# Patient Record
Sex: Female | Born: 1985 | Race: White | Hispanic: No | Marital: Single | Smoking: Current every day smoker
Health system: Southern US, Community
[De-identification: ages and names within clinical notes are randomized; demographics above are authoritative.]

## PROBLEM LIST (undated history)

## (undated) ENCOUNTER — Inpatient Hospital Stay (HOSPITAL_COMMUNITY): Payer: Self-pay

## (undated) DIAGNOSIS — IMO0002 Reserved for concepts with insufficient information to code with codable children: Secondary | ICD-10-CM

## (undated) DIAGNOSIS — Z9889 Other specified postprocedural states: Secondary | ICD-10-CM

## (undated) DIAGNOSIS — S46002A Unspecified injury of muscle(s) and tendon(s) of the rotator cuff of left shoulder, initial encounter: Secondary | ICD-10-CM

## (undated) DIAGNOSIS — F32A Depression, unspecified: Secondary | ICD-10-CM

## (undated) DIAGNOSIS — F329 Major depressive disorder, single episode, unspecified: Secondary | ICD-10-CM

## (undated) DIAGNOSIS — O24419 Gestational diabetes mellitus in pregnancy, unspecified control: Secondary | ICD-10-CM

## (undated) DIAGNOSIS — H9190 Unspecified hearing loss, unspecified ear: Secondary | ICD-10-CM

## (undated) DIAGNOSIS — R112 Nausea with vomiting, unspecified: Secondary | ICD-10-CM

## (undated) DIAGNOSIS — Z8489 Family history of other specified conditions: Secondary | ICD-10-CM

## (undated) DIAGNOSIS — R87629 Unspecified abnormal cytological findings in specimens from vagina: Secondary | ICD-10-CM

## (undated) DIAGNOSIS — B999 Unspecified infectious disease: Secondary | ICD-10-CM

## (undated) DIAGNOSIS — E669 Obesity, unspecified: Secondary | ICD-10-CM

## (undated) DIAGNOSIS — R87619 Unspecified abnormal cytological findings in specimens from cervix uteri: Secondary | ICD-10-CM

## (undated) DIAGNOSIS — K8020 Calculus of gallbladder without cholecystitis without obstruction: Secondary | ICD-10-CM

## (undated) DIAGNOSIS — M549 Dorsalgia, unspecified: Secondary | ICD-10-CM

## (undated) HISTORY — PX: TONSILLECTOMY: SUR1361

## (undated) HISTORY — DX: Major depressive disorder, single episode, unspecified: F32.9

## (undated) HISTORY — PX: OTHER SURGICAL HISTORY: SHX169

## (undated) HISTORY — DX: Obesity, unspecified: E66.9

## (undated) HISTORY — DX: Gestational diabetes mellitus in pregnancy, unspecified control: O24.419

## (undated) HISTORY — DX: Depression, unspecified: F32.A

## (undated) HISTORY — DX: Dorsalgia, unspecified: M54.9

---
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
## 2007-06-21 ENCOUNTER — Emergency Department (HOSPITAL_COMMUNITY): Admission: EM | Admit: 2007-06-21 | Discharge: 2007-06-21 | Payer: Self-pay | Admitting: Emergency Medicine

## 2008-07-10 ENCOUNTER — Emergency Department (HOSPITAL_COMMUNITY): Admission: EM | Admit: 2008-07-10 | Discharge: 2008-07-10 | Payer: Self-pay | Admitting: Family Medicine

## 2008-10-12 ENCOUNTER — Encounter (INDEPENDENT_AMBULATORY_CARE_PROVIDER_SITE_OTHER): Payer: Self-pay | Admitting: *Deleted

## 2008-10-20 ENCOUNTER — Encounter: Payer: Self-pay | Admitting: Family Medicine

## 2008-10-20 ENCOUNTER — Ambulatory Visit: Payer: Self-pay | Admitting: Family Medicine

## 2008-10-20 DIAGNOSIS — E669 Obesity, unspecified: Secondary | ICD-10-CM | POA: Insufficient documentation

## 2008-10-21 LAB — CONVERTED CEMR LAB
HCT: 42 % (ref 36.0–46.0)
Hemoglobin: 14.2 g/dL (ref 12.0–15.0)
MCHC: 33.8 g/dL (ref 30.0–36.0)
MCV: 88.2 fL (ref 78.0–100.0)
Platelets: 282 10*3/uL (ref 150–400)
RBC: 4.76 M/uL (ref 3.87–5.11)
RDW: 13.6 % (ref 11.5–15.5)
TSH: 1.432 microintl units/mL (ref 0.350–4.500)
WBC: 11.4 10*3/uL — ABNORMAL HIGH (ref 4.0–10.5)

## 2008-11-22 ENCOUNTER — Ambulatory Visit: Payer: Self-pay | Admitting: Family Medicine

## 2008-11-22 DIAGNOSIS — J3081 Allergic rhinitis due to animal (cat) (dog) hair and dander: Secondary | ICD-10-CM

## 2008-11-22 DIAGNOSIS — F17200 Nicotine dependence, unspecified, uncomplicated: Secondary | ICD-10-CM

## 2009-01-05 ENCOUNTER — Telehealth: Payer: Self-pay | Admitting: Family Medicine

## 2009-01-06 ENCOUNTER — Ambulatory Visit: Payer: Self-pay | Admitting: Family Medicine

## 2009-01-06 DIAGNOSIS — G4700 Insomnia, unspecified: Secondary | ICD-10-CM

## 2009-01-27 ENCOUNTER — Telehealth: Payer: Self-pay | Admitting: Family Medicine

## 2009-01-27 ENCOUNTER — Ambulatory Visit: Payer: Self-pay | Admitting: Family Medicine

## 2009-02-10 ENCOUNTER — Ambulatory Visit: Payer: Self-pay | Admitting: Family Medicine

## 2009-08-16 ENCOUNTER — Ambulatory Visit: Payer: Self-pay | Admitting: Family Medicine

## 2009-08-16 DIAGNOSIS — H9319 Tinnitus, unspecified ear: Secondary | ICD-10-CM | POA: Insufficient documentation

## 2009-09-04 ENCOUNTER — Telehealth (INDEPENDENT_AMBULATORY_CARE_PROVIDER_SITE_OTHER): Payer: Self-pay | Admitting: *Deleted

## 2009-09-06 ENCOUNTER — Telehealth (INDEPENDENT_AMBULATORY_CARE_PROVIDER_SITE_OTHER): Payer: Self-pay | Admitting: *Deleted

## 2009-09-12 ENCOUNTER — Encounter: Payer: Self-pay | Admitting: Family Medicine

## 2009-10-04 ENCOUNTER — Emergency Department (HOSPITAL_COMMUNITY): Admission: EM | Admit: 2009-10-04 | Discharge: 2009-10-04 | Payer: Self-pay | Admitting: Emergency Medicine

## 2009-10-26 ENCOUNTER — Encounter: Payer: Self-pay | Admitting: Orthopedic Surgery

## 2009-11-08 ENCOUNTER — Ambulatory Visit: Payer: Self-pay | Admitting: Obstetrics & Gynecology

## 2009-11-08 LAB — CONVERTED CEMR LAB: hCG, Beta Chain, Quant, S: <2 milliintl units/mL

## 2009-11-15 ENCOUNTER — Encounter: Payer: Self-pay | Admitting: Orthopedic Surgery

## 2009-11-15 ENCOUNTER — Telehealth: Payer: Self-pay | Admitting: Family Medicine

## 2010-03-15 ENCOUNTER — Emergency Department (HOSPITAL_COMMUNITY): Admission: EM | Admit: 2010-03-15 | Discharge: 2010-03-15 | Payer: Self-pay | Admitting: Family Medicine

## 2010-03-15 ENCOUNTER — Telehealth: Payer: Self-pay | Admitting: *Deleted

## 2010-03-20 ENCOUNTER — Ambulatory Visit: Payer: Self-pay | Admitting: Family Medicine

## 2010-03-20 DIAGNOSIS — J029 Acute pharyngitis, unspecified: Secondary | ICD-10-CM | POA: Insufficient documentation

## 2010-03-20 DIAGNOSIS — F331 Major depressive disorder, recurrent, moderate: Secondary | ICD-10-CM | POA: Insufficient documentation

## 2010-03-20 LAB — CONVERTED CEMR LAB: Rapid Strep: NEGATIVE

## 2010-05-08 ENCOUNTER — Emergency Department (HOSPITAL_COMMUNITY): Admission: EM | Admit: 2010-05-08 | Discharge: 2010-05-08 | Payer: Self-pay | Admitting: Family Medicine

## 2010-06-18 ENCOUNTER — Emergency Department (HOSPITAL_COMMUNITY)
Admission: EM | Admit: 2010-06-18 | Discharge: 2010-06-18 | Payer: Self-pay | Source: Home / Self Care | Admitting: Family Medicine

## 2010-06-19 ENCOUNTER — Inpatient Hospital Stay (HOSPITAL_COMMUNITY)
Admission: AD | Admit: 2010-06-19 | Discharge: 2010-06-19 | Payer: Self-pay | Source: Home / Self Care | Attending: Obstetrics & Gynecology | Admitting: Obstetrics & Gynecology
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
## 2010-07-17 NOTE — Progress Notes (Signed)
Summary: resc  Phone Note Call from Patient   Summary of Call: pt had to resch due to her being on her menses Initial call taken by: De Nurse,  November 15, 2009 10:24 AM  Follow-up for Phone Call        OK. Will see at next appointment  Follow-up by: Bobby Rumpf  MD,  November 15, 2009 11:22 AM

## 2010-07-17 NOTE — Progress Notes (Signed)
Summary: phn msg  Phone Note From Other Clinic Call back at [phone]   [facility] Summary of Call: Can not see pt due to having adult mediciad.  Will have to refer somewhere else. Initial call taken by: Clydell Hakim,  September 04, 2009 2:32 PM  Follow-up for Phone Call         will try [facility]. Theresia Lo RN  September 05, 2009 10:18 AM  Follow-up by: Theresia Lo RN,  September 05, 2009 10:18 AM

## 2010-07-17 NOTE — Progress Notes (Signed)
Summary: msg  Phone Note Call from Patient Call back at [phone]   Caller: Patient Summary of Call: pt returned call Initial call taken by: De Nurse,  September 06, 2009 11:05 AM  Follow-up for Phone Call        message left  to return call again. Follow-up by: Theresia Lo RN,  September 06, 2009 12:27 PM  Additional Follow-up for Phone Call Additional follow up Details #1::        patient notified of appointment time for ENT for hearing evaluation. Additional Follow-up by: Theresia Lo RN,  September 06, 2009 5:01 PM

## 2010-07-17 NOTE — Progress Notes (Signed)
Summary: triage  Phone Note Call from Patient Call back at Home Phone [phone]   Caller: Mom-Teresa Summary of Call: jaw pain - swollen jaw Initial call taken by: De Nurse,  March 15, 2010 8:46 AM  Follow-up for Phone Call        called pt. mom had to wake up the pt. pt reports having swelling of right jaw for a few days. hurts to chew. had dental check up a few weeks ago. does not think, that it's because of her teeth. offered pt to be seen now, pt declined.  advised the pt to call back for appt with PCP, if swelling continues.  Follow-up by: Arlyss Repress CMA,,  March 15, 2010 9:26 AM

## 2010-07-17 NOTE — Consult Note (Signed)
Summary: [facility]  [facility]   Imported By: Bradly Bienenstock 09/15/2009 16:24:52  _____________________________________________________________________  External Attachment:    Type:   Image     Comment:   External Document
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
## 2010-08-24 ENCOUNTER — Encounter: Payer: Self-pay | Admitting: Family Medicine

## 2010-08-24 ENCOUNTER — Ambulatory Visit (INDEPENDENT_AMBULATORY_CARE_PROVIDER_SITE_OTHER): Payer: Self-pay | Admitting: Family Medicine

## 2010-08-24 VITALS — BP 112/62 | HR 92 | Temp 98.2°F | Ht 60.5 in | Wt 162.0 lb

## 2010-08-24 DIAGNOSIS — F339 Major depressive disorder, recurrent, unspecified: Secondary | ICD-10-CM

## 2010-08-24 DIAGNOSIS — R599 Enlarged lymph nodes, unspecified: Secondary | ICD-10-CM

## 2010-08-24 DIAGNOSIS — R590 Localized enlarged lymph nodes: Secondary | ICD-10-CM

## 2010-08-24 MED ORDER — CETIRIZINE HCL 10 MG PO TABS: 10.0000 mg | ORAL_TABLET | Freq: Every day | ORAL | Status: DC

## 2010-08-24 MED ORDER — THERA VITAL M PO TABS: 1.0000 | ORAL_TABLET | Freq: Every day | ORAL | Status: DC

## 2010-08-24 NOTE — Progress Notes (Signed)
  Subjective:    Patient ID: Maureen Perez, female    DOB: [birth year], 23 y.o.   MRN: [id number]  HPI  Left jaw pain x 1 week, + ear pain on right side for same duration, no fever, no tooth pain, pain is more soreness, no injury to face or neck, last tooth removed 1 year ago, URI symptoms 2 weeks ago S/P Permethrin for bedbug treatment that she and mother contacted as they live in a motel currently  Mood- history of depression has been on multiple med,s feels paxil is not helping as much as it used to , still has some sad days, some anger outburst, family situation is difficult, currently they live in a motel, she is still in a relationship, but not employed. Her mother is also dealing with her depression and anxiety and she projects that onto her.    Review of Systems per above     Objective:   Physical Exam   GEN- NAD, alert and oriented    HEENT- TM clear bilat, oropharynx clear, no dental abscess, fair dentition, no sinus pressure, conjunctiva clear, no pain at TMJ, no pain on palpation of mandible   Neck- small mobile anterior cervical nodes < 0.5cm mobile, Tender to palpation, normal ROM, c spine non tender   RESP- CTAB   [facility]-RRR       Assessment & Plan:    Lymphadenopathy- cerivcal nodes palpated where pt is tender, no evidence of current infection but she does recall  Having cold symptoms a few weeks ago, and was treated for scabies, will monitor at this time, if no improvement or they enlarge would image  Depression- pt has been on multiple medications and often switches for various reasons, Effexor may be a good choice for her but she is uninsured. Will have her see psychiatry and she is open to this before any other changes, continue paxil, no red flags, very complex family/social situation contributing to her mood changes and depression

## 2010-08-24 NOTE — Patient Instructions (Signed)
Start the vitamin daily  Start Cetrizine for your allergies We will call you with information for the psychiatrist  If the lymph nodes are still present are painful or larger  in 1 week come back to be seen

## 2010-08-27 LAB — URINE MICROSCOPIC-ADD ON

## 2010-08-27 LAB — COMPREHENSIVE METABOLIC PANEL
ALT: 14 U/L (ref 0–35)
AST: 15 U/L (ref 0–37)
Albumin: 4.3 g/dL (ref 3.5–5.2)
Alkaline Phosphatase: 66 U/L (ref 39–117)
BUN: 6 mg/dL (ref 6–23)
CO2: 28 mEq/L (ref 19–32)
Calcium: 9.4 mg/dL (ref 8.4–10.5)
Chloride: 104 mEq/L (ref 96–112)
Creatinine, Ser: 0.76 mg/dL (ref 0.4–1.2)
GFR calc Af Amer: >60 mL/min (ref >60)
GFR calc non Af Amer: >60 mL/min (ref >60)
Glucose, Bld: 109 mg/dL — ABNORMAL HIGH (ref 70–99)
Potassium: 4 mEq/L (ref 3.5–5.1)
Sodium: 138 mEq/L (ref 135–145)
Total Bilirubin: 0.6 mg/dL (ref 0.3–1.2)
Total Protein: 7.5 g/dL (ref 6.0–8.3)

## 2010-08-27 LAB — POCT URINALYSIS DIPSTICK
Bilirubin Urine: NEGATIVE
Glucose, UA: NEGATIVE mg/dL
Ketones, ur: NEGATIVE mg/dL
Nitrite: NEGATIVE
Protein, ur: NEGATIVE mg/dL
Specific Gravity, Urine: <=1.005 (ref 1.005–1.030)
Urobilinogen, UA: 0.2 mg/dL (ref 0.0–1.0)
pH: 6.5 (ref 5.0–8.0)

## 2010-08-27 LAB — URINALYSIS, ROUTINE W REFLEX MICROSCOPIC
Bilirubin Urine: NEGATIVE
Glucose, UA: NEGATIVE mg/dL
Ketones, ur: NEGATIVE mg/dL
Nitrite: NEGATIVE
Protein, ur: 30 mg/dL — AB
Specific Gravity, Urine: 1.01 (ref 1.005–1.030)
Urobilinogen, UA: 0.2 mg/dL (ref 0.0–1.0)
pH: 7 (ref 5.0–8.0)

## 2010-08-27 LAB — URINE CULTURE
Colony Count: 35000
Culture  Setup Time: 201201021638

## 2010-08-27 LAB — HCG, QUANTITATIVE, PREGNANCY: hCG, Beta Chain, Quant, S: <2 m[IU]/mL (ref <5)

## 2010-08-27 LAB — POCT PREGNANCY, URINE
Preg Test, Ur: NEGATIVE
Preg Test, Ur: NEGATIVE

## 2010-08-27 LAB — ABO/RH: ABO/RH(D): O POS

## 2010-08-27 LAB — CBC
HCT: 42.4 % (ref 36.0–46.0)
Hemoglobin: 14.1 g/dL (ref 12.0–15.0)
MCH: 29.3 pg (ref 26.0–34.0)
MCHC: 33.3 g/dL (ref 30.0–36.0)
MCV: 88 fL (ref 78.0–100.0)
Platelets: 265 10*3/uL (ref 150–400)
RBC: 4.82 MIL/uL (ref 3.87–5.11)
RDW: 13.2 % (ref 11.5–15.5)
WBC: 9.3 10*3/uL (ref 4.0–10.5)

## 2010-08-27 LAB — GC/CHLAMYDIA PROBE AMP, GENITAL
Chlamydia, DNA Probe: NEGATIVE
GC Probe Amp, Genital: NEGATIVE

## 2010-08-27 LAB — WET PREP, GENITAL
Trich, Wet Prep: NONE SEEN
Yeast Wet Prep HPF POC: NONE SEEN

## 2010-08-28 LAB — POCT URINALYSIS DIPSTICK
Glucose, UA: NEGATIVE mg/dL
Nitrite: NEGATIVE
Urobilinogen, UA: 0.2 mg/dL (ref 0.0–1.0)

## 2010-08-31 ENCOUNTER — Telehealth: Payer: Self-pay | Admitting: Family Medicine

## 2010-08-31 NOTE — Telephone Encounter (Signed)
Unable to reach pt. I called in her rx to the health dept.Maureen Perez

## 2010-08-31 NOTE — Telephone Encounter (Signed)
States that last 2 scripts were sent to wrong pharmacy.  Needs to go to [facility]

## 2010-09-05 ENCOUNTER — Telehealth: Payer: Self-pay | Admitting: Family Medicine

## 2010-09-05 NOTE — Telephone Encounter (Signed)
Will route to her PCP. 

## 2010-09-05 NOTE — Telephone Encounter (Signed)
Pt states that [facility] does not have the Zyrtec or vitamins that were supposed to be called in for her.

## 2010-09-06 MED ORDER — THERA VITAL M PO TABS: 1.0000 | ORAL_TABLET | Freq: Every day | ORAL | Status: DC

## 2010-09-06 MED ORDER — CETIRIZINE HCL 10 MG PO TABS: 10.0000 mg | ORAL_TABLET | Freq: Every day | ORAL | Status: DC

## 2010-09-06 NOTE — Telephone Encounter (Signed)
[facility] evidently did not get initial script, [facility]- told pt she can pick them up

## 2010-09-06 NOTE — Telephone Encounter (Signed)
Pt calling again, wants these [facility]

## 2010-09-11 ENCOUNTER — Encounter: Payer: Self-pay | Admitting: Family Medicine

## 2010-09-11 ENCOUNTER — Ambulatory Visit (INDEPENDENT_AMBULATORY_CARE_PROVIDER_SITE_OTHER): Payer: Self-pay | Admitting: Family Medicine

## 2010-09-11 VITALS — BP 118/78 | Temp 97.7°F | Ht 61.0 in | Wt 166.0 lb

## 2010-09-11 DIAGNOSIS — I889 Nonspecific lymphadenitis, unspecified: Secondary | ICD-10-CM

## 2010-09-11 DIAGNOSIS — R109 Unspecified abdominal pain: Secondary | ICD-10-CM | POA: Insufficient documentation

## 2010-09-11 DIAGNOSIS — F331 Major depressive disorder, recurrent, moderate: Secondary | ICD-10-CM

## 2010-09-11 LAB — COMPREHENSIVE METABOLIC PANEL
AST: 20 U/L (ref 0–37)
Alkaline Phosphatase: 56 U/L (ref 39–117)
BUN: 7 mg/dL (ref 6–23)
Creat: 0.73 mg/dL (ref 0.40–1.20)
Total Bilirubin: 0.4 mg/dL (ref 0.3–1.2)

## 2010-09-11 LAB — CBC WITH DIFFERENTIAL/PLATELET
Basophils Absolute: 0 10*3/uL (ref 0.0–0.1)
Basophils Relative: 0 % (ref 0–1)
Eosinophils Relative: 5 % (ref 0–5)
HCT: 43.1 % (ref 36.0–46.0)
MCHC: 32.9 g/dL (ref 30.0–36.0)
MCV: 90.9 fL (ref 78.0–100.0)
Monocytes Absolute: 0.8 10*3/uL (ref 0.1–1.0)
Neutro Abs: 5.4 10*3/uL (ref 1.7–7.7)
RDW: 13.4 % (ref 11.5–15.5)

## 2010-09-11 LAB — POCT MONO (EPSTEIN BARR VIRUS): Mono, POC: NEGATIVE

## 2010-09-11 MED ORDER — CLINDAMYCIN HCL 300 MG PO CAPS: 300.0000 mg | ORAL_CAPSULE | Freq: Three times a day (TID) | ORAL | Status: DC

## 2010-09-11 NOTE — Progress Notes (Signed)
  Subjective:    Patient ID: Maureen Perez, female    DOB: 11-28-1985, 25 y.o.   MRN: [id number]  HPI       Now in a new room at the motel- no longer symptomatic from bed bugs          Lymphadenopathy-  Persist lymph nodes x 3 weeks,now feels some discomoft on on the right side, no fever, no vomiting, mild sore throat, no URI symptoms    Abd pain- Abd pain x 2 weeks.   Pain across epigastrium - sharp pain, no heartburn, has had pain since she was a child- notes spicey foods, tomatoes cause this pain, raw vegtabables and fatty food/greasy causes pain     sister has a ?contagious stomach infection currently and [name] drank after her.   No diarrhea, regular bowel movements, decreased appetite ,no blood in stools , no emesis  no dysuria, no vaginal discharge   Depression- Has split from her fianacee, continues to stay with her mother and father in the motel, Paxil not helping as much, feels she is still irriated, has both good and bad days, wants to attend couseling, everyone agrees it would be best for the family, no SI, no thoughts of hurting others      LMP- March 5th  Review of Systems  Per above     Objective:   Physical Exam  GEN- NAD, alert and oriented  TM clear bilat, oropharynx clear, no dental abscess, fair dentition,  conjunctiva clear, no pain at TMJ, no pain on palpation of mandible  Neck- small mobile anterior cervical nodes - Left  0.5cm mobile- largest, shotty on right side, Tender to palpation, normal ROM, c spine non tender  ABD- NABS, soft, mild diffuse tenderness, no rebound, no gaurding, no splenomegaly, no hepatomegaly, neg murphy sign PSYCH- not depressed appearing, not anxious appearing, normal affect, good eye contact, speech normal       Assessment & Plan:

## 2010-09-11 NOTE — Patient Instructions (Signed)
I will call you with lab results Start the clindamycin for your lymph nodes- 1 tablet three times a day for 10 days For your stomach, avoid fatty foods, and greasy foods, spicey foods If you develop fever, or vomiting or change in your stools come in to be seen or go to the ER if needed.  For your mood Call these numbers to set up appt with a pyschiatrist  [facility] [phone] (Outpatient treatment/ psychiatry/psychology) [facility] [phone] [facility] Support Team [phone]

## 2010-09-12 ENCOUNTER — Telehealth: Payer: Self-pay | Admitting: Family Medicine

## 2010-09-12 ENCOUNTER — Encounter: Payer: Self-pay | Admitting: Family Medicine

## 2010-09-12 ENCOUNTER — Telehealth: Payer: Self-pay | Admitting: *Deleted

## 2010-09-12 MED ORDER — CLINDAMYCIN HCL 300 MG PO CAPS: 300.0000 mg | ORAL_CAPSULE | Freq: Three times a day (TID) | ORAL | Status: AC

## 2010-09-12 MED ORDER — IBUPROFEN 800 MG PO TABS: 800.0000 mg | ORAL_TABLET | Freq: Three times a day (TID) | ORAL | Status: DC | PRN

## 2010-09-12 NOTE — Assessment & Plan Note (Signed)
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

## 2010-09-12 NOTE — Assessment & Plan Note (Signed)
Pt has been on  Multiple meds, I have given her numbers for psychiatrist and family therapy.  Note she was with her mother for a later appt today and had scheduled with [facility] for next week. I would appreciate help with management of her meds and the family will definitely benefit from counseling

## 2010-09-12 NOTE — Telephone Encounter (Signed)
Disregard request for change of pharmacy.  Pt needed to have rx for ibuprofen sent to [facility].

## 2010-09-12 NOTE — Telephone Encounter (Signed)
Unable to reach pt by phone to give lab results Will send prescription to [facility]

## 2010-09-12 NOTE — Assessment & Plan Note (Addendum)
Acute abd pain, though pt has had history of possibly some IBS as a child, does not fit typical gastroenteritis without fever, emesis change in bowels, exam not concerning for gallbladder or spleen enlargement with lymph nodes. This could also be her somatization of her stress and mood disorder as things are difficult right now for the family and recent breakup Check CBC, CMET, Monospot

## 2010-09-12 NOTE — Telephone Encounter (Signed)
Message copied by Tessie Fass on Wed Sep 12, 2010  5:23 PM ------      Message from: Milinda Antis      Created: Wed Sep 12, 2010  4:23 PM       Please let Ms. Blazejewski know her labs were normal      No evidence of infection, no evidence of liver, kidney, or gallbladder disease, her mono test was normal      Drink plenty of fluids, her abd pain should improve      If she has diarrhea, vomiting, fever or worsening of pain she should return to see me            Ibuprofen was sent to [facility]

## 2010-09-12 NOTE — Telephone Encounter (Signed)
Called patient and informed of message from Dr Novinger.Maureen Perez  

## 2010-09-12 NOTE — Telephone Encounter (Signed)
Maureen Perez called to say that the rx written for [place] yesterday was sent to [facility] in error.  [facility]  Please resend to them for pickup

## 2010-09-17 ENCOUNTER — Other Ambulatory Visit: Payer: Self-pay | Admitting: Family Medicine

## 2010-09-17 MED ORDER — DESLORATADINE 5 MG PO TABS: 5.0000 mg | ORAL_TABLET | Freq: Every day | ORAL | Status: DC

## 2010-09-17 NOTE — Telephone Encounter (Signed)
Changed Zyrtec to Clarinex as covered by MAP program

## 2010-09-18 ENCOUNTER — Ambulatory Visit (HOSPITAL_COMMUNITY): Payer: Self-pay | Admitting: Psychology

## 2010-10-02 ENCOUNTER — Ambulatory Visit (HOSPITAL_COMMUNITY): Payer: Self-pay | Admitting: Psychology

## 2010-10-10 ENCOUNTER — Ambulatory Visit (HOSPITAL_COMMUNITY): Payer: Self-pay | Admitting: Physician Assistant

## 2010-10-10 DIAGNOSIS — F340 Cyclothymic disorder: Secondary | ICD-10-CM

## 2010-11-01 ENCOUNTER — Encounter (HOSPITAL_COMMUNITY): Payer: Self-pay | Admitting: Physician Assistant

## 2010-11-02 NOTE — Op Note (Signed)
[facility]  Patient:    Maureen Perez, Maureen Perez                    MRN: [id number] Proc. Date: 06/11/00 Adm. Date:  [id number] Disc. Date: [id number] Attending:  Sandi Raveling CC:         Sonda Primes, [name] [facility]   Operative Report  PREOPERATIVE DIAGNOSES: 1. Chronic tonsillitis. 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  POSTOPERATIVE DIAGNOSES: 1. Chronic tonsillitis. 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  PROCEDURE:  Tonsillectomy and adenoidectomy.  SURGEON:  Jefry H. Pollyann Kennedy, [name]  ANESTHESIA:  General endotracheal anesthesia was used.  COMPLICATIONS:  No complications.  ESTIMATED BLOOD LOSS:  Less than 10 cc.  FINDINGS:  Diffuse enlargement of the tonsils with cryptic spaces and tonsil lithiasis bilaterally.  Mild to moderate enlargement of the adenoid with partial obstruction of the nasopharynx.  REFERRING PHYSICIAN:  Sonda Primes, [name]  HISTORY:  A 25 year old with a history of loud snoring, obstructive breathing, and chronic tonsillitis.  Risks, benefits, alternatives, and complications of the procedure were explained to the parents, who seemed to understand and agreed.  DESCRIPTION OF PROCEDURE:  The patient was taken to the operating room and placed on the operating table in supine position.  Following induction of general endotracheal anesthesia, the table was turned 90 degrees and the patient was draped in the standard fashion.  A Crowe-Davis mouth gag was inserted into the oral cavity, used to retract the tongue and mandible, and attached to the Mayo stand.  Inspection of the palate revealed no evidence of a submucous cleft or shortening of the soft palate.  Red rubber catheter was inserted in the right side of the nose, withdrawn through the mouth, and used to retract the soft palate and uvula.  Indirect examination of the nasopharynx was performed, and a single pass of a large adenoid  curette was used to remove the majority of the adenoid tissue.  The nasopharynx was then packed while the tonsillectomy was performed.  Tonsillectomy was performed using electrocautery dissection, carefully dissecting an avascular plane between the tonsillar capsule and the constrictor muscle.  There was minimal bleeding encountered along the dissection.  The tonsils were sent together for pathologic evaluation with the adenoid tissue.  The packing was removed from the nasopharynx, and suction cautery was used to provide hemostasis.  The pharynx was suctioned of blood and secretions, irrigated with saline solution, and a nasogastric tube was used to aspirate the contents of the stomach.  The patient was then awakened, extubated, and transferred to recovery. DD:  06/11/00 TD:  06/11/00 Job: [id number]

## 2010-12-09 ENCOUNTER — Emergency Department (HOSPITAL_COMMUNITY): Payer: Self-pay

## 2010-12-09 ENCOUNTER — Inpatient Hospital Stay (INDEPENDENT_AMBULATORY_CARE_PROVIDER_SITE_OTHER)
Admission: RE | Admit: 2010-12-09 | Discharge: 2010-12-09 | Disposition: A | Discharge status: Home / Self Care | Payer: Self-pay | Source: Ambulatory Visit | Attending: Family Medicine | Admitting: Family Medicine

## 2010-12-09 ENCOUNTER — Emergency Department (HOSPITAL_COMMUNITY)
Admission: EM | Admit: 2010-12-09 | Discharge: 2010-12-09 | Disposition: A | Discharge status: Home / Self Care | Payer: Self-pay | Attending: Emergency Medicine | Admitting: Emergency Medicine

## 2010-12-09 DIAGNOSIS — K5730 Diverticulosis of large intestine without perforation or abscess without bleeding: Secondary | ICD-10-CM | POA: Insufficient documentation

## 2010-12-09 DIAGNOSIS — N898 Other specified noninflammatory disorders of vagina: Secondary | ICD-10-CM | POA: Insufficient documentation

## 2010-12-09 DIAGNOSIS — R1110 Vomiting, unspecified: Secondary | ICD-10-CM | POA: Insufficient documentation

## 2010-12-09 DIAGNOSIS — R1031 Right lower quadrant pain: Secondary | ICD-10-CM

## 2010-12-09 DIAGNOSIS — R300 Dysuria: Secondary | ICD-10-CM | POA: Insufficient documentation

## 2010-12-09 DIAGNOSIS — K7689 Other specified diseases of liver: Secondary | ICD-10-CM | POA: Insufficient documentation

## 2010-12-09 LAB — POCT I-STAT, CHEM 8
Chloride: 106 mEq/L (ref 96–112)
Creatinine, Ser: 0.8 mg/dL (ref 0.50–1.10)
Glucose, Bld: 90 mg/dL (ref 70–99)
HCT: 44 % (ref 36.0–46.0)
Potassium: 4.2 mEq/L (ref 3.5–5.1)

## 2010-12-09 LAB — URINALYSIS, ROUTINE W REFLEX MICROSCOPIC
Leukocytes, UA: NEGATIVE
Nitrite: NEGATIVE
Specific Gravity, Urine: 1.01 (ref 1.005–1.030)
Urobilinogen, UA: 0.2 mg/dL (ref 0.0–1.0)

## 2010-12-09 LAB — DIFFERENTIAL
Eosinophils Relative: 2 % (ref 0–5)
Lymphocytes Relative: 23 % (ref 12–46)
Lymphs Abs: 2.6 10*3/uL (ref 0.7–4.0)

## 2010-12-09 LAB — POCT URINALYSIS DIP (DEVICE)
Ketones, ur: NEGATIVE mg/dL
Protein, ur: 100 mg/dL — AB
Specific Gravity, Urine: 1.02 (ref 1.005–1.030)
pH: 6 (ref 5.0–8.0)

## 2010-12-09 LAB — CBC
HCT: 42.2 % (ref 36.0–46.0)
Hemoglobin: 14.7 g/dL (ref 12.0–15.0)
MCV: 88.3 fL (ref 78.0–100.0)
RDW: 13.4 % (ref 11.5–15.5)
WBC: 11 10*3/uL — ABNORMAL HIGH (ref 4.0–10.5)

## 2010-12-09 LAB — URINE MICROSCOPIC-ADD ON

## 2010-12-09 LAB — POCT PREGNANCY, URINE: Preg Test, Ur: NEGATIVE

## 2010-12-09 LAB — WET PREP, GENITAL

## 2010-12-09 MED ORDER — IOHEXOL 300 MG/ML  SOLN
80.0000 mL | Freq: Once | INTRAMUSCULAR | Status: AC | PRN
  Administered 2010-12-09: 80 mL via INTRAVENOUS

## 2010-12-11 LAB — URINE CULTURE
Colony Count: NO GROWTH
Culture  Setup Time: 201206242032
Culture: NO GROWTH

## 2010-12-30 ENCOUNTER — Encounter (HOSPITAL_COMMUNITY): Payer: Self-pay | Admitting: *Deleted

## 2010-12-30 ENCOUNTER — Inpatient Hospital Stay (HOSPITAL_COMMUNITY)
Admission: AD | Admit: 2010-12-30 | Discharge: 2010-12-30 | Disposition: A | Discharge status: Home / Self Care | Payer: Self-pay | Source: Ambulatory Visit | Attending: Obstetrics and Gynecology | Admitting: Obstetrics and Gynecology

## 2010-12-30 DIAGNOSIS — O210 Mild hyperemesis gravidarum: Secondary | ICD-10-CM

## 2010-12-30 DIAGNOSIS — Z3201 Encounter for pregnancy test, result positive: Secondary | ICD-10-CM

## 2010-12-30 DIAGNOSIS — O219 Vomiting of pregnancy, unspecified: Secondary | ICD-10-CM

## 2010-12-30 LAB — COMPREHENSIVE METABOLIC PANEL
ALT: 17 U/L (ref 0–35)
AST: 13 U/L (ref 0–37)
Alkaline Phosphatase: 68 U/L (ref 39–117)
CO2: 23 mEq/L (ref 19–32)
Chloride: 103 mEq/L (ref 96–112)
GFR calc Af Amer: >60 mL/min (ref >60)
GFR calc non Af Amer: >60 mL/min (ref >60)
Glucose, Bld: 89 mg/dL (ref 70–99)
Potassium: 3.9 mEq/L (ref 3.5–5.1)
Sodium: 137 mEq/L (ref 135–145)
Total Bilirubin: 0.3 mg/dL (ref 0.3–1.2)

## 2010-12-30 LAB — URINALYSIS, ROUTINE W REFLEX MICROSCOPIC
Ketones, ur: NEGATIVE mg/dL
Leukocytes, UA: NEGATIVE
Protein, ur: NEGATIVE mg/dL
Urobilinogen, UA: 0.2 mg/dL (ref 0.0–1.0)

## 2010-12-30 LAB — POCT PREGNANCY, URINE: Preg Test, Ur: NEGATIVE

## 2010-12-30 LAB — CBC
Hemoglobin: 13.7 g/dL (ref 12.0–15.0)
MCH: 29.9 pg (ref 26.0–34.0)
Platelets: 238 10*3/uL (ref 150–400)
RBC: 4.58 MIL/uL (ref 3.87–5.11)
WBC: 11.5 10*3/uL — ABNORMAL HIGH (ref 4.0–10.5)

## 2010-12-30 MED ORDER — PROMETHAZINE HCL 12.5 MG PO TABS: 25.0000 mg | ORAL_TABLET | Freq: Four times a day (QID) | ORAL | Status: DC | PRN

## 2010-12-30 NOTE — Progress Notes (Signed)
I came in because I was vomiting, couldn't keep anything down, dizzy.  +HPT (faint)

## 2010-12-30 NOTE — Progress Notes (Signed)
Pt presents to [facility] for c/o nausea for the last week or 2.  States she can't keep food down but can keep fluids down. Has been feeling dizzy.  Took HPT and was +.

## 2010-12-30 NOTE — ED Notes (Signed)
N. Bascom Levels, CNM in to see pt.  Assessment done and poc discussed.

## 2010-12-30 NOTE — ED Provider Notes (Signed)
History     Chief Complaint  Patient presents with  . Emesis During Pregnancy   HPI G1P0010 reports vomiting x 2 weeks, vomits every time she eats, able to keep fluids down. "Faint positive" UPT at home. No pain, dizziness, no dysuria, constipation, diarrhea LMP 6/1.   OB History    Grav Para Term Preterm Abortions TAB SAB Ect Mult Living   1    1  1    0      Past Medical History  Diagnosis Date  . Depression   . Back pain   . Obesity     No past surgical history on file.  Family History  Problem Relation Age of Onset  . Depression Mother   . Diabetes Mother   . Hypertension Mother   . COPD Mother   . Depression Sister   . Diabetes Sister   . Kidney disease Sister     History  Substance Use Topics  . Smoking status: Current Everyday Smoker -- 1.0 packs/day    Types: Cigarettes  . Smokeless tobacco: Never Used  . Alcohol Use: No    Allergies:  Allergies  Allergen Reactions  . Keflex Itching and Nausea And Vomiting    Prescriptions prior to admission  Medication Sig Dispense Refill  . desloratadine (CLARINEX) 5 MG tablet Take 1 tablet (5 mg total) by mouth daily.  30 tablet  2  . Multiple Vitamins-Minerals (MULTIVITAMIN) tablet Take 1 tablet by mouth daily.  30 tablet  11  . ibuprofen (ADVIL,MOTRIN) 800 MG tablet Take 1 tablet (800 mg total) by mouth 3 (three) times daily as needed. for pain  40 tablet  2  . PARoxetine (PAXIL) 20 MG tablet Take 20 mg by mouth daily.          Review of Systems  Constitutional: Negative for fever, chills and malaise/fatigue.  HENT: Negative.   Respiratory: Negative.   Cardiovascular: Negative.   Gastrointestinal: Positive for nausea and vomiting. Negative for heartburn, abdominal pain, diarrhea and constipation.  Genitourinary: Negative.   Musculoskeletal: Negative.   Skin: Negative.   Neurological: Negative.   Psychiatric/Behavioral: Negative.    Physical Exam   Blood pressure 126/79, pulse 84, temperature 98.4 F  (36.9 C), temperature source Oral, resp. rate 20, height 5\' 1"  (1.549 m), weight 71.396 kg (157 lb 6.4 oz), last menstrual period 11/16/2010.  Physical Exam  Constitutional: She is oriented to person, place, and time. She appears well-developed and well-nourished.  Cardiovascular: Normal rate.   Respiratory: Effort normal.  GI: Soft. She exhibits no distension and no mass. There is no tenderness. There is no rebound and no guarding.  Neurological: She is alert and oriented to person, place, and time.  Skin: Skin is warm and dry.  Psychiatric: She has a normal mood and affect.   Results for orders placed during the hospital encounter of 12/30/10 (from the past 24 hour(s))  URINALYSIS, ROUTINE W REFLEX MICROSCOPIC     Status: Abnormal   Collection Time   12/30/10  8:10 PM      Component Value Range   Color, Urine YELLOW  YELLOW    Appearance CLEAR  CLEAR    Specific Gravity, Urine <1.005 (*) 1.005 - 1.030    pH 5.5  5.0 - 8.0    Glucose, UA NEGATIVE  NEGATIVE (mg/dL)   Hgb urine dipstick NEGATIVE  NEGATIVE    Bilirubin Urine NEGATIVE  NEGATIVE    Ketones, ur NEGATIVE  NEGATIVE (mg/dL)   Protein, ur NEGATIVE  NEGATIVE (mg/dL)   Urobilinogen, UA 0.2  0.0 - 1.0 (mg/dL)   Nitrite NEGATIVE  NEGATIVE    Leukocytes, UA NEGATIVE  NEGATIVE   POCT PREGNANCY, URINE     Status: Normal   Collection Time   12/30/10  8:40 PM      Component Value Range   Preg Test, Ur NEGATIVE    HCG, SERUM, QUALITATIVE     Status: Abnormal   Collection Time   12/30/10  9:10 PM      Component Value Range   Preg, Serum POSITIVE (*) NEGATIVE   CBC     Status: Abnormal   Collection Time   12/30/10  9:10 PM      Component Value Range   WBC 11.5 (*) 4.0 - 10.5 (K/uL)   RBC 4.58  3.87 - 5.11 (MIL/uL)   Hemoglobin 13.7  12.0 - 15.0 (g/dL)   HCT 81.1  91.4 - 78.2 (%)   MCV 89.3  78.0 - 100.0 (fL)   MCH 29.9  26.0 - 34.0 (pg)   MCHC 33.5  30.0 - 36.0 (g/dL)   RDW 95.6  21.3 - 08.6 (%)   Platelets 238  150 - 400  (K/uL)  COMPREHENSIVE METABOLIC PANEL     Status: Normal   Collection Time   12/30/10  9:10 PM      Component Value Range   Sodium 137  135 - 145 (mEq/L)   Potassium 3.9  3.5 - 5.1 (mEq/L)   Chloride 103  96 - 112 (mEq/L)   CO2 23  19 - 32 (mEq/L)   Glucose, Bld 89  70 - 99 (mg/dL)   BUN 6  6 - 23 (mg/dL)   Creatinine, Ser 5.78  0.50 - 1.10 (mg/dL)   Calcium 9.4  8.4 - 46.9 (mg/dL)   Total Protein 7.6  6.0 - 8.3 (g/dL)   Albumin 4.0  3.5 - 5.2 (g/dL)   AST 13  0 - 37 (U/L)   ALT 17  0 - 35 (U/L)   Alkaline Phosphatase 68  39 - 117 (U/L)   Total Bilirubin 0.3  0.3 - 1.2 (mg/dL)   GFR calc non Af Amer >60  >60 (mL/min)   GFR calc Af Amer >60  >60 (mL/min)     MAU Course  Procedures   A/P: 25 yo G2P0010 @ [redacted] wks EGA with nausea and vomiting of pregnancy Rx phenergan Start prenatal care, pregnancy verification given

## 2011-01-03 ENCOUNTER — Encounter (HOSPITAL_COMMUNITY): Payer: Self-pay | Admitting: *Deleted

## 2011-01-10 ENCOUNTER — Encounter: Payer: Self-pay | Admitting: Family Medicine

## 2011-01-16 ENCOUNTER — Other Ambulatory Visit: Payer: Self-pay

## 2011-01-16 ENCOUNTER — Ambulatory Visit (INDEPENDENT_AMBULATORY_CARE_PROVIDER_SITE_OTHER): Payer: Self-pay | Admitting: *Deleted

## 2011-01-16 ENCOUNTER — Encounter: Payer: Self-pay | Admitting: *Deleted

## 2011-01-16 DIAGNOSIS — O3680X0 Pregnancy with inconclusive fetal viability, not applicable or unspecified: Secondary | ICD-10-CM

## 2011-01-16 DIAGNOSIS — Z3480 Encounter for supervision of other normal pregnancy, unspecified trimester: Secondary | ICD-10-CM

## 2011-01-16 NOTE — Progress Notes (Signed)
Patient had two periods in June.

## 2011-01-17 LAB — OBSTETRIC PANEL
Antibody Screen: NEGATIVE
Eosinophils Relative: 1 % (ref 0–5)
Hemoglobin: 13.9 g/dL (ref 12.0–15.0)
Lymphocytes Relative: 21 % (ref 12–46)
Lymphs Abs: 2.2 10*3/uL (ref 0.7–4.0)
MCH: 30 pg (ref 26.0–34.0)
MCHC: 33.2 g/dL (ref 30.0–36.0)
Monocytes Absolute: 0.6 10*3/uL (ref 0.1–1.0)
Rubella: 9.5 IU/mL — ABNORMAL HIGH

## 2011-01-18 ENCOUNTER — Inpatient Hospital Stay (HOSPITAL_COMMUNITY): Payer: Self-pay

## 2011-01-18 ENCOUNTER — Inpatient Hospital Stay (HOSPITAL_COMMUNITY)
Admission: AD | Admit: 2011-01-18 | Discharge: 2011-01-19 | Disposition: A | Discharge status: Home / Self Care | Payer: Self-pay | Source: Ambulatory Visit | Attending: Obstetrics & Gynecology | Admitting: Obstetrics & Gynecology

## 2011-01-18 DIAGNOSIS — Z3490 Encounter for supervision of normal pregnancy, unspecified, unspecified trimester: Secondary | ICD-10-CM

## 2011-01-18 DIAGNOSIS — R109 Unspecified abdominal pain: Secondary | ICD-10-CM | POA: Insufficient documentation

## 2011-01-18 DIAGNOSIS — O26859 Spotting complicating pregnancy, unspecified trimester: Secondary | ICD-10-CM | POA: Insufficient documentation

## 2011-01-18 DIAGNOSIS — Z1389 Encounter for screening for other disorder: Secondary | ICD-10-CM

## 2011-01-18 LAB — URINALYSIS, ROUTINE W REFLEX MICROSCOPIC
Glucose, UA: NEGATIVE mg/dL
Ketones, ur: NEGATIVE mg/dL
Leukocytes, UA: NEGATIVE
Specific Gravity, Urine: 1.02 (ref 1.005–1.030)
pH: 6 (ref 5.0–8.0)

## 2011-01-18 LAB — HCG, QUANTITATIVE, PREGNANCY: hCG, Beta Chain, Quant, S: 26060 m[IU]/mL — ABNORMAL HIGH (ref <5)

## 2011-01-18 LAB — URINE MICROSCOPIC-ADD ON

## 2011-01-18 NOTE — Progress Notes (Signed)
Pt states, " I started spotting at 3 pm  Today and then a an hour and half latter I started cramping."

## 2011-01-18 NOTE — ED Notes (Signed)
Pt transferred to [place] ambulatory from lobby. Plan of care  Explained to pt.

## 2011-01-19 NOTE — ED Provider Notes (Signed)
History     Chief Complaint  Patient presents with  . Vaginal Bleeding   HPI  Maureen Perez  is a 25 y.o. G2P0010 at [redacted]w[redacted]d weeks presenting with light spotting and mild cramping earlier tonight, no pain or bleeding since. Prenatal care initiated at [facility] this week. Has next appt on 8/15.   OB History    Grav Para Term Preterm Abortions TAB SAB Ect Mult Living   2    1  1    0      Past Medical History  Diagnosis Date  . Depression   . Back pain   . Obesity     Past Surgical History  Procedure Date  . Tubes in ears age 68  . Tonsillectomy     age 37    Family History  Problem Relation Age of Onset  . Depression Mother   . Diabetes Mother   . Hypertension Mother   . COPD Mother   . Depression Sister   . Diabetes Sister   . Kidney disease Sister     History  Substance Use Topics  . Smoking status: Current Everyday Smoker -- 1.0 packs/day    Types: Cigarettes  . Smokeless tobacco: Never Used  . Alcohol Use: No    Allergies:  Allergies  Allergen Reactions  . Keflex Itching and Nausea And Vomiting    Prescriptions prior to admission  Medication Sig Dispense Refill  . desloratadine (CLARINEX) 5 MG tablet Take 1 tablet (5 mg total) by mouth daily.  30 tablet  2  . Multiple Vitamins-Minerals (MULTIVITAMIN) tablet Take 1 tablet by mouth daily.  30 tablet  11    ROS Negative except as noted in HPI Physical Exam   Blood pressure 120/78, pulse 67, temperature 98.7 F (37.1 C), temperature source Oral, resp. rate 18, height 5' (1.524 m), weight 69.117 kg (152 lb 6 oz), last menstrual period 11/16/2010.  Physical Exam  Constitutional: She is oriented to person, place, and time. She appears well-developed and well-nourished. No distress.  Cardiovascular: Normal rate.   Respiratory: Effort normal.  Musculoskeletal: Normal range of motion.  Neurological: She is alert and oriented to person, place, and time.  Skin: Skin is warm and dry.  Psychiatric:  She has a normal mood and affect.    MAU Course  Procedures US Ob Comp Less 14 Wks  01/19/2011  *RADIOLOGY REPORT*  Clinical Data: bleeding and cramping,spotting; ;  OBSTETRIC <14 WK [place] AND TRANSVAGINAL OB US  Technique: Both transabdominal and transvaginal ultrasound examinations were performed for complete evaluation of the gestation as well as the maternal uterus, adnexal regions, and pelvic cul-de-sac.  Comparison: None.  Findings: There is a single intrauterine gestation.  Crown-rump length is 2.2 mm for estimated gestational age of [redacted] weeks 6 days. Fetal heart rate 99 beats per minute.  No subchorionic hemorrhage.  Ovaries are symmetric in size and echotexture.  No adnexal masses. No free fluid.  IMPRESSION: 5-week-6-day intrauterine pregnancy.  Fetal heart rate 99 beats per minute.  Original Report Authenticated By: [name], [name]   U/S completed prior to room becoming available for exam. Discussed results with patient. Pt denies ongoing pain or bleeding. Offered pelvic exam to r/o infection, pt declines. Has f/u on 8/15, will return with ongoing pain or bleeding.   Assessment and Plan  25 y.o. G2P0010 with [redacted]w[redacted]d IUP F/U as scheduled Return with bleeding or pain  [name] 01/19/2011, 12:33 AM

## 2011-01-20 LAB — CULTURE, URINE COMPREHENSIVE

## 2011-01-23 ENCOUNTER — Encounter: Payer: Self-pay | Admitting: Family Medicine

## 2011-01-30 ENCOUNTER — Ambulatory Visit (HOSPITAL_COMMUNITY)
Admission: RE | Admit: 2011-01-30 | Discharge: 2011-01-30 | Disposition: A | Discharge status: Home / Self Care | Payer: Self-pay | Source: Ambulatory Visit | Attending: Obstetrics and Gynecology | Admitting: Obstetrics and Gynecology

## 2011-01-30 DIAGNOSIS — Z3689 Encounter for other specified antenatal screening: Secondary | ICD-10-CM | POA: Insufficient documentation

## 2011-01-30 DIAGNOSIS — O209 Hemorrhage in early pregnancy, unspecified: Secondary | ICD-10-CM | POA: Insufficient documentation

## 2011-01-30 DIAGNOSIS — O3680X0 Pregnancy with inconclusive fetal viability, not applicable or unspecified: Secondary | ICD-10-CM

## 2011-02-11 ENCOUNTER — Encounter: Payer: Self-pay | Admitting: Obstetrics and Gynecology

## 2011-02-19 ENCOUNTER — Encounter: Payer: Self-pay | Admitting: Family Medicine

## 2011-02-26 ENCOUNTER — Ambulatory Visit (INDEPENDENT_AMBULATORY_CARE_PROVIDER_SITE_OTHER): Payer: Medicaid Other | Admitting: Family Medicine

## 2011-02-26 ENCOUNTER — Other Ambulatory Visit: Payer: Self-pay | Admitting: Family Medicine

## 2011-02-26 ENCOUNTER — Other Ambulatory Visit (HOSPITAL_COMMUNITY)
Admission: RE | Admit: 2011-02-26 | Discharge: 2011-02-26 | Disposition: A | Discharge status: Home / Self Care | Payer: Medicaid Other | Source: Ambulatory Visit | Attending: Family Medicine | Admitting: Family Medicine

## 2011-02-26 ENCOUNTER — Encounter: Payer: Self-pay | Admitting: Family Medicine

## 2011-02-26 VITALS — BP 119/73 | Wt 153.0 lb

## 2011-02-26 DIAGNOSIS — O0990 Supervision of high risk pregnancy, unspecified, unspecified trimester: Secondary | ICD-10-CM | POA: Insufficient documentation

## 2011-02-26 DIAGNOSIS — Z01419 Encounter for gynecological examination (general) (routine) without abnormal findings: Secondary | ICD-10-CM | POA: Insufficient documentation

## 2011-02-26 DIAGNOSIS — Z3400 Encounter for supervision of normal first pregnancy, unspecified trimester: Secondary | ICD-10-CM

## 2011-02-26 DIAGNOSIS — Z3682 Encounter for antenatal screening for nuchal translucency: Secondary | ICD-10-CM

## 2011-02-26 NOTE — Progress Notes (Signed)
   Subjective:    Maureen Perez is a G2P0010 [redacted]w[redacted]d being seen today for her first obstetrical visit.  Her obstetrical history is significant for SAB.Pregnancy history fully reviewed.  Patient reports no complaints.  Filed Vitals:   02/26/11 1300  BP: 119/73  Weight: 153 lb (69.4 kg)    HISTORY: OB History    Grav Para Term Preterm Abortions TAB SAB Ect Mult Living   2    1  1    0     # Outc Date GA Lbr Len/2nd Wgt Sex Del Anes PTL Lv   1 SAB  [redacted]w[redacted]d          2 CUR              Past Medical History  Diagnosis Date  . Depression   . Back pain   . Obesity    Past Surgical History  Procedure Date  . Tubes in ears age 53  . Tonsillectomy     age 35   Family History  Problem Relation Age of Onset  . Depression Mother   . Diabetes Mother   . Hypertension Mother   . COPD Mother   . Depression Sister   . Diabetes Sister   . Kidney disease Sister      Exam    Uterine Size: size equals dates  Pelvic Exam:    Perineum: No Hemorrhoids   Vulva: normal, female escutcheon   Vagina:  normal mucosa   pH:    Cervix: anteverted   Adnexa: normal adnexa   Bony Pelvis: android  System: Breast:  normal appearance, no masses or tenderness   Skin: normal coloration and turgor, no rashes    Neurologic: oriented   Extremities: normal strength, tone, and muscle mass   HEENT sclera clear, anicteric   Mouth/Teeth mucous membranes moist, pharynx normal without lesions   Neck supple   Cardiovascular: regular rate and rhythm   Respiratory:  appears well, vitals normal, no respiratory distress, acyanotic, normal RR, ear and throat exam is normal, neck free of mass or lymphadenopathy, chest clear, no wheezing, crepitations, rhonchi, normal symmetric air entry   Abdomen: soft, non-tender; bowel sounds normal; no masses,  no organomegaly   Urinary: urethral meatus normal      Assessment:    Pregnancy: G2P0010 Patient Active Problem List  Diagnoses  . OBESITY  . DEPRESSION,  MODERATE, RECURRENT  . TOBACCO ABUSE  . TINNITUS, CHRONIC, BILATERAL  . ALLERGIC RHINITIS DUE TO ANIMAL HAIR AND DANDER  . INSOMNIA  . Lymphadenitis  . Abdominal pain  . Supervision of normal first pregnancy        Plan:     Initial labs drawn. Prenatal vitamins. Problem list reviewed and updated. Genetic Screening discussed First Screen and Integrated Screen: ordered.  Ultrasound discussed; fetal survey: requested.  Follow up in 4 weeks. Early 1 hour Mike Berntsen S 02/26/2011

## 2011-02-26 NOTE — Patient Instructions (Addendum)
Smoking Cessation This document explains the best ways for you to quit smoking and new treatments to help. It lists new medicines that can double or triple your chances of quitting and quitting for good. It also considers ways to avoid relapses and concerns you may have about quitting, including weight gain. NICOTINE: A POWERFUL ADDICTION If you have tried to quit smoking, you know how hard it can be. It is hard because nicotine is a very addictive drug. For some people, it can be as addictive as heroin or cocaine. Usually, people make 2 or 3 tries, or more, before finally being able to quit. Each time you try to quit, you can learn about what helps and what hurts. Quitting takes hard work and a lot of effort, but you can quit smoking. QUITTING SMOKING IS ONE OF THE MOST IMPORTANT THINGS YOU WILL EVER DO:  You will live longer, feel better, and live better.   The impact on your body of quitting smoking is felt almost immediately:   Within 20 minutes, blood pressure decreases. Pulse returns to its normal level.   After 8 hours, carbon monoxide levels in the blood return to normal. Oxygen level increases.   After 24 hours, chance of heart attack starts to decrease. Breath, hair, and body stop smelling like smoke.   After 48 hours, damaged nerve endings begin to recover. Sense of taste and smell improve.   After 72 hours, the body is virtually free of nicotine. Bronchial tubes relax and breathing becomes easier.   After 2 to 12 weeks, lungs can hold more air. Exercise becomes easier and circulation improves.   Quitting will lower your chance of having a heart attack, stroke, cancer, or lung disease:   After 1 year, the risk of coronary heart disease is cut in half.   After 5 years, the risk of stroke falls to the same as a nonsmoker.   After 10 years, the risk of lung cancer is cut in half and the risk of other cancers decreases significantly.   After 15 years, the risk of coronary heart  disease drops, usually to the level of a nonsmoker.   If you are pregnant, quitting smoking will improve your chances of having a healthy baby.   The people you live with, especially your children, will be healthier.   You will have extra money to spend on things other than cigarettes.  FIVE KEYS TO QUITTING Studies have shown that these 5 steps will help you quit smoking and quit for good. You have the best chances of quitting if you use them together: 1. Get ready.  2. Get support and encouragement.  3. Learn new skills and behaviors.  4. Get medicine to reduce your nicotine addiction and use it correctly.  5. Be prepared for relapse or difficult situations. Be determined to continue trying to quit, even if you do not succeed at first.  1. GET READY  Set a quit date.   Change your environment.   Get rid of ALL cigarettes, ashtrays, matches, and lighters in your home, car, and place of work.   Do not let people smoke in your home.   Review your past attempts to quit. Think about what worked and what did not.   Once you quit, do not smoke. NOT EVEN A PUFF!  2. GET SUPPORT AND ENCOURAGEMENT Studies have shown that you have a better chance of being successful if you have help. You can get support in many ways.  Tell  your family, friends, and coworkers that you are going to quit and need their support. Ask them not to smoke around you.   Talk to your caregivers (doctor, dentist, nurse, pharmacist, psychologist, and/or smoking counselor).   Get individual, group, or telephone counseling and support. The more counseling you have, the better your chances are of quitting. Programs are available at local hospitals and health centers. Call your local health department for information about programs in your area.   Spiritual beliefs and practices may help some smokers quit.   Quit meters are small computer programs online or downloadable that keep track of quit statistics, such as amount  of "quit-time," cigarettes not smoked, and money saved.   Many smokers find one or more of the many self-help books available useful in helping them quit and stay off tobacco.  3. LEARN NEW SKILLS AND BEHAVIORS  Try to distract yourself from urges to smoke. Talk to someone, go for a walk, or occupy your time with a task.   When you first try to quit, change your routine. Take a different route to work. Drink tea instead of coffee. Eat breakfast in a different place.   Do something to reduce your stress. Take a hot bath, exercise, or read a book.   Plan something enjoyable to do every day. Reward yourself for not smoking.   Explore interactive web-based programs that specialize in helping you quit.  4. GET MEDICINE AND USE IT CORRECTLY Medicines can help you stop smoking and decrease the urge to smoke. Combining medicine with the above behavioral methods and support can quadruple your chances of successfully quitting smoking. The [place] Food and Drug Administration (FDA) has approved 7 medicines to help you quit smoking. These medicines fall into 3 categories.  Nicotine replacement therapy (delivers nicotine to your body without the negative effects and risks of smoking):   Nicotine gum: Available over-the-counter.   Nicotine lozenges: Available over-the-counter.   Nicotine inhaler: Available by prescription.   Nicotine nasal spray: Available by prescription.   Nicotine skin patches (transdermal): Available by prescription and over-the-counter.   Antidepressant medicine (helps people abstain from smoking, but how this works is unknown):   Bupropion sustained-release (SR) tablets: Available by prescription.   Nicotinic receptor partial agonist (simulates the effect of nicotine in your brain):   Varenicline tartrate tablets: Available by prescription.   Ask your caregiver for advice about which medicines to use and how to use them. Carefully read the information on the package.    Everyone who is trying to quit may benefit from using a medicine. If you are pregnant or trying to become pregnant, nursing an infant, you are under age 18, or you smoke fewer than 10 cigarettes per day, talk to your caregiver before taking any nicotine replacement medicines.   You should stop using a nicotine replacement product and call your caregiver if you experience nausea, dizziness, weakness, vomiting, fast or irregular heartbeat, mouth problems with the lozenge or gum, or redness or swelling of the skin around the patch that does not go away.   Do not use any other product containing nicotine while using a nicotine replacement product.   Talk to your caregiver before using these products if you have diabetes, heart disease, asthma, stomach ulcers, you had a recent heart attack, you have high blood pressure that is not controlled with medicine, a history of irregular heartbeat, or you have been prescribed medicine to help you quit smoking.  5. BE PREPARED FOR RELAPSE OR   DIFFICULT SITUATIONS  Most relapses occur within the first 3 months after quitting. Do not be discouraged if you start smoking again. Remember, most people try several times before they finally quit.   You may have symptoms of withdrawal because your body is used to nicotine. You may crave cigarettes, be irritable, feel very hungry, cough often, get headaches, or have difficulty concentrating.   The withdrawal symptoms are only temporary. They are strongest when you first quit, but they will go away within 10 to 14 days.  Here are some difficult situations to watch for:  Alcohol. Avoid drinking alcohol. Drinking lowers your chances of successfully quitting.   Caffeine. Try to reduce the amount of caffeine you consume. It also lowers your chances of successfully quitting.   Other smokers. Being around smoking can make you want to smoke. Avoid smokers.   Weight gain. Many smokers will gain weight when they quit, usually  less than 10 pounds. Eat a healthy diet and stay active. Do not let weight gain distract you from your main goal, quitting smoking. Some medicines that help you quit smoking may also help delay weight gain. You can always lose the weight gained after you quit.   Bad mood or depression. There are a lot of ways to improve your mood other than smoking.  If you are having problems with any of these situations, talk to your caregiver. SPECIAL SITUATIONS OR CONDITIONS Studies suggest that everyone can quit smoking. Your situation or condition can give you a special reason to quit.  Pregnant women/New mothers: By quitting, you protect your baby's health and your own.   Hospitalized patients: By quitting, you reduce health problems and help healing.   Heart attack patients: By quitting, you reduce your risk of a second heart attack.   Lung, head, and neck cancer patients: By quitting, you reduce your chance of a second cancer.   Parents of children and adolescents: By quitting, you protect your children from illnesses caused by secondhand smoke.  QUESTIONS TO THINK ABOUT Think about the following questions before you try to stop smoking. You may want to talk about your answers with your caregiver.  Why do you want to quit?   If you tried to quit in the past, what helped and what did not?   What will be the most difficult situations for you after you quit? How will you plan to handle them?   Who can help you through the tough times? Your family? Friends? Caregiver?   What pleasures do you get from smoking? What ways can you still get pleasure if you quit?  Here are some questions to ask your caregiver:  How can you help me to be successful at quitting?   What medicine do you think would be best for me and how should I take it?   What should I do if I need more help?   What is smoking withdrawal like? How can I get information on withdrawal?  Quitting takes hard work and a lot of effort,  but you can quit smoking. FOR MORE INFORMATION Smokefree.gov (http://www.davis-sullivan.com/) provides free, accurate, evidence-based information and professional assistance to help support the immediate and long-term needs of people trying to quit smoking. Document Released: 05/28/2001 Document Re-Released: 11/21/2009 [facility] Patient Information 2011 [place], [place]. Pregnancy - First Trimester During sexual intercourse, millions of sperm go into the vagina. Only 1 sperm will penetrate and fertilize the female egg while it is in the Fallopian tube. One week later, the  fertilized egg implants into the wall of the uterus. An embryo begins to develop into a baby. At 6 to 8 weeks, the eyes and face are formed and the heartbeat can be seen on ultrasound. At the end of 12 weeks (first trimester), all the baby's organs are formed. Now that you are pregnant, you will want to do everything you can to have a healthy baby. Two of the most important things are to get good prenatal care and follow your caregiver's instructions. Prenatal care is all the medical care you receive before the baby's birth. It is given to prevent, find and treat problems during the pregnancy and childbirth. PRENATAL EXAMS:  During prenatal visits, your weight, blood pressure and urine are checked. This is done to make sure you are healthy and progressing normally during the pregnancy.   A pregnant woman should gain 25 to 35 pounds during the pregnancy. However, if you are over weight or underweight, your caregiver will advise you regarding your weight.   Your caregiver will ask and answer questions for you.   Blood work, cervical cultures, other necessary tests and a Pap test are done during your prenatal exams. These tests are done to check on your health and the probable health of your baby. Tests are strongly recommended and done for HIV with your permission. This is the virus that causes AIDS. These tests are done because medications  can be given to help prevent your baby from being born with this infection should you have been infected without knowing it. Blood work is also used to find out your blood type, previous infections and follow your blood levels (hemoglobin).   Low hemoglobin (anemia) is common during pregnancy. Iron and vitamins are given to help prevent this. Later in the pregnancy, blood tests for diabetes will be done along with any other tests if any problems develop. You may need tests to make sure you and the baby are doing well.   You may need other tests to make sure you and the baby are doing well.  CHANGES DURING THE FIRST TRIMESTER (THE FIRST 3 MONTHS OF PREGNANCY) Your body goes through many changes during pregnancy. They vary from person to person. Talk to your caregiver about changes you notice and are concerned about. Changes can include:  Your menstrual period stops.   The egg and sperm carry the genes that determine what you look like. Genes from you and your partner are forming a baby. The female genes determine whether the baby is a boy or a girl.   Your body increases in girth and you may feel bloated.   Feeling sick to your stomach (nauseous) and throwing up (vomiting). If the vomiting is uncontrollable, call your caregiver.   Your breasts will begin to enlarge and become tender.   Your nipples may stick out more and become darker.   The need to urinate more. Painful urination may mean you have a bladder infection.   Tiring easily.   Loss of appetite.   Cravings for certain kinds of food.   At first, you may gain or lose a couple of pounds.   You may have changes in your emotions from day to day (excited to be pregnant or concerned something may go wrong with the pregnancy and baby).   You may have more vivid and strange dreams.  HOME CARE INSTRUCTIONS  It is very important to avoid all smoking, alcohol and un-prescribed drugs during your pregnancy. These affect the formation and  growth of the baby. Avoid chemicals while pregnant to ensure the delivery of a healthy infant.   Start your prenatal visits by the 12th week of pregnancy. They are usually scheduled monthly at first, then more often in the last 2 months before delivery. Keep your caregiver's appointments. Follow your caregiver's instructions regarding medication use, blood and lab tests, exercise, and diet.   During pregnancy, you are providing food for you and your baby. Eat regular, well-balanced meals. Choose foods such as meat, fish, milk and other low fat dairy products, vegetables, fruits, and whole-grain breads and cereals. Your caregiver will tell you of the ideal weight gain.   You can help morning sickness by keeping soda crackers (saltines) at the bedside. Eat a couple before arising in the morning. You may want to use the crackers without salt on them.   Eating 4 to 5 small meals rather than 3 large meals a day also may help the nausea and vomiting.   Drinking liquids between meals instead of during meals also seems to help nausea and vomiting.   A physical sexual relationship may be continued throughout pregnancy if there are no other problems. Problems may be early (premature) leaking of amniotic fluid from the membranes, vaginal bleeding, or belly (abdominal) pain.   Exercise regularly if there are no restrictions. Check with your caregiver or physical therapist if you are unsure of the safety of some of your exercises. Greater weight gain will occur in the last 2 trimesters of pregnancy. Exercising will help:   Control your weight.   Keep you in shape.   Prepare you for labor and delivery.   Help you lose your pregnancy weight after you deliver your baby.   Wear a good support or jogging bra for breast tenderness during pregnancy. This may help if worn during sleep too.   Ask when prenatal classes are available. Begin classes when they are offered.   Do not use hot tubs, steam rooms or  saunas.   Wear your seat belt when driving. This protects you and your baby if you are in an accident.   Avoid raw meat, uncooked cheese, cat litter boxes and soil used by cats throughout the pregnancy. These carry germs that can cause birth defects in the baby.   The first trimester is a good time to visit your dentist for your dental health. Getting your teeth cleaned is OK. Use a softer toothbrush and brush gently during pregnancy.   Ask for help if you have financial, counseling or nutritional needs during pregnancy. Your caregiver will be able to offer counseling for these needs as well as refer you for other special needs.   Do not take any medications or herbs unless told by your caregiver.   Inform your caregiver if there is any mental or physical domestic violence.   Make a list of emergency phone numbers of family, friends, hospital, police and fire department.   Write down your questions. Take them to your prenatal visit.   Do not douche.   Do not cross your legs.   If you have to stand for long periods of time, rotate you feet or take small steps in a circle.   You may have more vaginal secretions that may require a sanitary pad. Do not use tampons or scented sanitary pads.  MEDICATIONS AND DRUG USE IN PREGNANCY  Take prenatal vitamins as directed. The vitamin should contain 1 milligram of folic acid. Keep all vitamins out of reach of children.  Only a couple vitamins or tablets containing iron may be fatal to a baby or young child when ingested.   Avoid use of all medications, including herbs, over-the-counter medications, not prescribed or suggested by your caregiver. Only take over-the-counter or prescription medicines for pain, discomfort, or fever as directed by your caregiver. Do not use aspirin, ibuprofen (Motrin, Advil, Nuprin) or naproxen (Aleve) unless OK'd by your caregiver.   Let your caregiver also know about herbs you may be using.   Alcohol is related to  a number of birth defects. This includes fetal alcohol syndrome. All alcohol, in any form, should be avoided completely. Smoking will cause low birth rate and premature babies.   Street/illegal drugs are very harmful to the baby. They are absolutely forbidden. A baby born to an addicted mother will be addicted at birth. The baby will go through the same withdrawal an adult does.   Let your caregiver know about any medications that you have to take and for what reason you take them.  MISCARRIAGE IS COMMON DURING PREGNANCY A miscarriage does not mean you did something wrong. It is not a reason to worry about getting pregnant again. Your caregiver will help you with questions you may have. If you have a miscarriage, you may need minor surgery (a D & C). SEEK MEDICAL CARE IF:  You have any concerns or worries during your pregnancy. It is better to call with your questions if you feel they cannot wait, rather than worry about them.  SEEK IMMEDIATE MEDICAL CARE IF:  An unexplained oral temperature above 101 develops, or as your caregiver suggests.   You have leaking of fluid from the vagina (birth canal). If leaking membranes are suspected, take your temperature and inform your caregiver of this when you call.   There is vaginal spotting or bleeding. Notify your caregiver of the amount and how many pads are used.   You develop a bad smelling vaginal discharge with a change in the color.   You continue to feel sick to your stomach (nauseated) and have no relief from remedies suggested. You vomit blood or coffee ground like materials.   You lose more than 2 pounds of weight in one week.   You gain more than 2 pounds of weight in a week and you notice swelling of your face, hands, feet or legs.   You gain 5 pounds or more in 1 week (even if you do not have swelling of your hands, face, legs or feet).   You get exposed to [place] measles and have never had them.   You are exposed to fifth disease  or chicken pox.   You develop belly (abdominal) pain. Round ligament discomfort is a common non-cancerous (benign) cause of abdominal pain in pregnancy. Your caregiver still must evaluate this.   You develop headache, fever, diarrhea, pain with urination, or shortness of breath.   You fall, are in a car accident or have any kind of trauma.   There is mental or physical violence in your home.  Document Released: 05/28/2001 Document Re-Released: 11/21/2009 [facility] Patient Information 2011 [place], [place].

## 2011-02-26 NOTE — Progress Notes (Signed)
Addended by: Reva Bores on: 02/26/2011 04:56 PM   Modules accepted: Orders

## 2011-02-27 ENCOUNTER — Other Ambulatory Visit: Payer: Self-pay | Admitting: Family Medicine

## 2011-03-04 ENCOUNTER — Ambulatory Visit (HOSPITAL_COMMUNITY)
Admission: RE | Admit: 2011-03-04 | Discharge: 2011-03-04 | Disposition: A | Discharge status: Home / Self Care | Payer: Medicaid Other | Source: Ambulatory Visit | Attending: Family Medicine | Admitting: Family Medicine

## 2011-03-04 DIAGNOSIS — Z3689 Encounter for other specified antenatal screening: Secondary | ICD-10-CM | POA: Insufficient documentation

## 2011-03-04 DIAGNOSIS — O3510X0 Maternal care for (suspected) chromosomal abnormality in fetus, unspecified, not applicable or unspecified: Secondary | ICD-10-CM | POA: Insufficient documentation

## 2011-03-04 DIAGNOSIS — O351XX0 Maternal care for (suspected) chromosomal abnormality in fetus, not applicable or unspecified: Secondary | ICD-10-CM | POA: Insufficient documentation

## 2011-03-04 DIAGNOSIS — Z3682 Encounter for antenatal screening for nuchal translucency: Secondary | ICD-10-CM

## 2011-03-07 LAB — POCT URINALYSIS DIP (DEVICE)
Bilirubin Urine: NEGATIVE
Glucose, UA: NEGATIVE
Hgb urine dipstick: NEGATIVE
Ketones, ur: NEGATIVE
Specific Gravity, Urine: 1.015
pH: 7

## 2011-03-07 LAB — POCT PREGNANCY, URINE: Preg Test, Ur: NEGATIVE

## 2011-03-13 ENCOUNTER — Telehealth: Payer: Self-pay | Admitting: *Deleted

## 2011-03-13 NOTE — Telephone Encounter (Signed)
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

## 2011-03-13 NOTE — Telephone Encounter (Signed)
I spoke to patient and she will do Colpo at her next ob visit on 03/26/2011.

## 2011-03-14 ENCOUNTER — Inpatient Hospital Stay (INDEPENDENT_AMBULATORY_CARE_PROVIDER_SITE_OTHER)
Admission: RE | Admit: 2011-03-14 | Discharge: 2011-03-14 | Disposition: A | Discharge status: Home / Self Care | Payer: Medicaid Other | Source: Ambulatory Visit | Attending: Family Medicine | Admitting: Family Medicine

## 2011-03-14 DIAGNOSIS — J309 Allergic rhinitis, unspecified: Secondary | ICD-10-CM

## 2011-03-14 LAB — POCT RAPID STREP A: Streptococcus, Group A Screen (Direct): NEGATIVE

## 2011-03-26 ENCOUNTER — Encounter: Payer: Medicaid Other | Admitting: Obstetrics & Gynecology

## 2011-03-26 ENCOUNTER — Ambulatory Visit (INDEPENDENT_AMBULATORY_CARE_PROVIDER_SITE_OTHER): Payer: Medicaid Other | Admitting: Obstetrics & Gynecology

## 2011-03-26 VITALS — BP 132/78 | Wt 153.0 lb

## 2011-03-26 DIAGNOSIS — Z3480 Encounter for supervision of other normal pregnancy, unspecified trimester: Secondary | ICD-10-CM

## 2011-03-26 DIAGNOSIS — R87612 Low grade squamous intraepithelial lesion on cytologic smear of cervix (LGSIL): Secondary | ICD-10-CM

## 2011-03-26 DIAGNOSIS — Z3400 Encounter for supervision of normal first pregnancy, unspecified trimester: Secondary | ICD-10-CM

## 2011-03-26 DIAGNOSIS — IMO0002 Reserved for concepts with insufficient information to code with codable children: Secondary | ICD-10-CM

## 2011-03-26 MED ORDER — INFLUENZA VIRUS VACC SPLIT PF IM SUSP: 0.5000 mL | Freq: Once | INTRAMUSCULAR | Status: DC

## 2011-03-26 NOTE — Progress Notes (Signed)
Addended by: Vinnie Langton C on: 03/26/2011 04:34 PM   Modules accepted: Orders

## 2011-03-26 NOTE — Progress Notes (Signed)
No complaints.  Had LGSIL pap.  Here today for colposcopy. Patient given informed consent, signed copy in the chart, time out was performed.  Placed in lithotomy position. Cervix viewed with speculum and colposcope after application of acetic acid.   Colposcopy adequate?  Yes Acetowhite lesions?Yes, at 6 o'clock Punctation?No Mosaicism?  No Abnormal vasculature?  No Biopsies?No ECC?No  Will repeat pap smear postpartum.  Patient educated about role of HPV in cervical dysplasia, and about Gardasil.  Will receive Gardasil series postpartum.  1 hour GTT, quad screen today.  Will order anatomy scan.    PTL precautions advised.  RTC in 4 weeks.

## 2011-03-27 NOTE — Progress Notes (Signed)
This encounter was created in error - please disregard.

## 2011-03-28 ENCOUNTER — Ambulatory Visit (INDEPENDENT_AMBULATORY_CARE_PROVIDER_SITE_OTHER): Payer: Medicaid Other | Admitting: Family Medicine

## 2011-03-28 ENCOUNTER — Ambulatory Visit: Payer: Medicaid Other | Admitting: Family Medicine

## 2011-03-28 DIAGNOSIS — IMO0001 Reserved for inherently not codable concepts without codable children: Secondary | ICD-10-CM

## 2011-03-28 DIAGNOSIS — M791 Myalgia, unspecified site: Secondary | ICD-10-CM

## 2011-03-28 NOTE — Progress Notes (Signed)
Addended by: Edd Arbour on: 03/28/2011 09:34 PM   Modules accepted: Level of Service

## 2011-03-28 NOTE — Progress Notes (Signed)
Here for Flu symptoms S: c/o muscle aches on arms and neck. Pain was worse in the morning but has since resolved without intervention. No fever, no chills, no upper respiratory symptoms or signs. No flu vaccine. No sick contacts. One day duration. O: Vitals normal Lungs: CTA b/l Cardiac: RRR Abd: soft, nt, nd Back: no CVAT  A/p 1. Muscle aches  Unknown cause but not Flu like Will follow. Pt. Reassured.

## 2011-03-29 LAB — ALPHA FETOPROTEIN, MATERNAL: Open Spina bifida: NEGATIVE

## 2011-04-11 ENCOUNTER — Inpatient Hospital Stay (HOSPITAL_COMMUNITY)
Admission: AD | Admit: 2011-04-11 | Discharge: 2011-04-11 | Disposition: A | Discharge status: Home / Self Care | Payer: Medicaid Other | Source: Ambulatory Visit | Attending: Obstetrics and Gynecology | Admitting: Obstetrics and Gynecology

## 2011-04-11 ENCOUNTER — Encounter (HOSPITAL_COMMUNITY): Payer: Self-pay

## 2011-04-11 DIAGNOSIS — Z3400 Encounter for supervision of normal first pregnancy, unspecified trimester: Secondary | ICD-10-CM

## 2011-04-11 DIAGNOSIS — S8000XA Contusion of unspecified knee, initial encounter: Secondary | ICD-10-CM | POA: Insufficient documentation

## 2011-04-11 DIAGNOSIS — O99891 Other specified diseases and conditions complicating pregnancy: Secondary | ICD-10-CM | POA: Insufficient documentation

## 2011-04-11 DIAGNOSIS — Y9229 Other specified public building as the place of occurrence of the external cause: Secondary | ICD-10-CM | POA: Insufficient documentation

## 2011-04-11 DIAGNOSIS — W19XXXA Unspecified fall, initial encounter: Secondary | ICD-10-CM

## 2011-04-11 DIAGNOSIS — W010XXA Fall on same level from slipping, tripping and stumbling without subsequent striking against object, initial encounter: Secondary | ICD-10-CM | POA: Insufficient documentation

## 2011-04-11 HISTORY — DX: Unspecified abnormal cytological findings in specimens from cervix uteri: R87.619

## 2011-04-11 HISTORY — DX: Other specified postprocedural states: Z98.890

## 2011-04-11 HISTORY — DX: Reserved for concepts with insufficient information to code with codable children: IMO0002

## 2011-04-11 HISTORY — DX: Nausea with vomiting, unspecified: R11.2

## 2011-04-11 MED ORDER — PROMETHAZINE HCL 12.5 MG PO TABS: 12.5000 mg | ORAL_TABLET | Freq: Four times a day (QID) | ORAL | Status: DC | PRN

## 2011-04-11 NOTE — ED Provider Notes (Signed)
History     Chief Complaint  Patient presents with  . Fall   HPIMelissa D Russell25 y.o.[redacted]w[redacted]d presents after calling [facility] because she fell at [employer].  G1. Was taking niece to bathroom and the child ran in front of her and tripped her.  She fell right hip and knee.  Denies head injury.  Reports bruising on her right knee, that is sore.  Denies vaginal bleeding and abdominal pain.  Patient is asking for Phenergan refill.    Past Medical History  Diagnosis Date  . Back pain   . Obesity   . Depression     no meds since pregancny  . Abnormal Pap smear     repeat WNL  . PONV (postoperative nausea and vomiting)     Past Surgical History  Procedure Date  . Tubes in ears age 60  . Tonsillectomy     age 6    Family History  Problem Relation Age of Onset  . Depression Mother   . Diabetes Mother   . Hypertension Mother   . COPD Mother   . Depression Sister   . Diabetes Sister   . Kidney disease Sister     History  Substance Use Topics  . Smoking status: Current Everyday Smoker -- 1.0 packs/day for 6 years    Types: Cigarettes  . Smokeless tobacco: Never Used  . Alcohol Use: No    Allergies:  Allergies  Allergen Reactions  . Keflex Itching and Nausea And Vomiting    Prescriptions prior to admission  Medication Sig Dispense Refill  . Loratadine (CLARITIN) 10 MG CAPS Take 1 capsule by mouth daily.        . prenatal vitamin w/FE, FA (PRENATAL 1 + 1) 27-1 MG TABS Take 1 tablet by mouth daily.        . promethazine (PHENERGAN) 25 MG tablet Take 25 mg by mouth every 6 (six) hours as needed. For nausea       . desloratadine (CLARINEX) 5 MG tablet Take 1 tablet (5 mg total) by mouth daily.  30 tablet  2  . Multiple Vitamins-Minerals (MULTIVITAMIN) tablet Take 1 tablet by mouth daily.  30 tablet  11    Review of Systems  Constitutional: Negative.   Genitourinary:       Neg for vaginal bleeding.  Musculoskeletal: Positive for falls (on right knee, bruising and  sorenss).  FHR 159    Physical Exam   Blood pressure 128/66, pulse 110, temperature 98.9 F (37.2 C), temperature source Oral, resp. rate 16, height 5\' 2"  (1.575 m), weight 151 lb 6.4 oz (68.675 kg), last menstrual period 12/05/2010, SpO2 98.00%.  Physical Exam  Constitutional: She is oriented to person, place, and time. She appears well-developed and well-nourished. No distress.  GI: Soft. There is no tenderness.  Musculoskeletal:       Right knee, small bruise below patella.  Neurological: She is alert and oriented to person, place, and time.  Skin: Skin is warm and dry.   FHR 159 MAU Course  Procedures  MDM  5:15 Called Dr. Penne Lash to report on patient.  Patient is non emergent.  Dr. Penne Lash will call back. Returned called at 5:23.  Reported patient's hx, sxs, pt report of no vaginal bleeding or abdominal cramping.  Right knee is bruised and tender.  May discharge to home.  Tylenol for discomfort.  Assessment and Plan  A:  Fall      [redacted] weeks pregnant     Bruising under  right patella P:  May take tylenol for pain.  Instructed patient to call with any vaginal bleeding or abdominal pain.  If pain in her knee worsens, call her doctor for further evaluation. Pt asked for refill for Phenergan.  Rx written.  Matt Holmes 04/11/2011, 5:02 PM   Matt Holmes, NP 04/11/11 1732  Matt Holmes, NP 04/11/11 1732

## 2011-04-11 NOTE — Progress Notes (Signed)
Patient states she fell at about 1200 while in [address]. Hit her right side, hip and leg. Started having a little cramping after the fall. No bleeding or leaking. Cramping is less but is having some pain on the right hip.

## 2011-04-15 NOTE — ED Provider Notes (Signed)
Agree with above note.  Kelliann Pendergraph 04/15/2011 11:48 AM   

## 2011-04-18 ENCOUNTER — Ambulatory Visit (HOSPITAL_COMMUNITY)
Admission: RE | Admit: 2011-04-18 | Discharge: 2011-04-18 | Disposition: A | Discharge status: Home / Self Care | Payer: Medicaid Other | Source: Ambulatory Visit | Attending: Obstetrics & Gynecology | Admitting: Obstetrics & Gynecology

## 2011-04-18 DIAGNOSIS — O358XX0 Maternal care for other (suspected) fetal abnormality and damage, not applicable or unspecified: Secondary | ICD-10-CM | POA: Insufficient documentation

## 2011-04-18 DIAGNOSIS — Z363 Encounter for antenatal screening for malformations: Secondary | ICD-10-CM | POA: Insufficient documentation

## 2011-04-18 DIAGNOSIS — Z3480 Encounter for supervision of other normal pregnancy, unspecified trimester: Secondary | ICD-10-CM

## 2011-04-18 DIAGNOSIS — Z1389 Encounter for screening for other disorder: Secondary | ICD-10-CM | POA: Insufficient documentation

## 2011-04-23 ENCOUNTER — Ambulatory Visit (INDEPENDENT_AMBULATORY_CARE_PROVIDER_SITE_OTHER): Payer: Medicaid Other | Admitting: Family Medicine

## 2011-04-23 DIAGNOSIS — Z3480 Encounter for supervision of other normal pregnancy, unspecified trimester: Secondary | ICD-10-CM

## 2011-04-23 MED ORDER — PROMETHAZINE HCL 25 MG PO TABS: 25.0000 mg | ORAL_TABLET | Freq: Four times a day (QID) | ORAL | Status: DC | PRN

## 2011-04-23 NOTE — Progress Notes (Signed)
Doing well--Nml anatomy, female fetus

## 2011-04-23 NOTE — Patient Instructions (Signed)
Pregnancy - Second Trimester The second trimester of pregnancy (3 to 6 months) is a period of rapid growth for you and your baby. At the end of the sixth month, your baby is about 9 inches long and weighs 1 1/2 pounds. You will begin to feel the baby move between 18 and 20 weeks of the pregnancy. This is called quickening. Weight gain is faster. A clear fluid (colostrum) may leak out of your breasts. You may feel small contractions of the womb (uterus). This is known as false labor or Braxton-Hicks contractions. This is like a practice for labor when the baby is ready to be born. Usually, the problems with morning sickness have usually passed by the end of your first trimester. Some women develop small dark blotches (called cholasma, mask of pregnancy) on their face that usually goes away after the baby is born. Exposure to the sun makes the blotches worse. Acne may also develop in some pregnant women and pregnant women who have acne, may find that it goes away. PRENATAL EXAMS  Blood work may continue to be done during prenatal exams. These tests are done to check on your health and the probable health of your baby. Blood work is used to follow your blood levels (hemoglobin). Anemia (low hemoglobin) is common during pregnancy. Iron and vitamins are given to help prevent this. You will also be checked for diabetes between 24 and 28 weeks of the pregnancy. Some of the previous blood tests may be repeated.   The size of the uterus is measured during each visit. This is to make sure that the baby is continuing to grow properly according to the dates of the pregnancy.   Your blood pressure is checked every prenatal visit. This is to make sure you are not getting toxemia.   Your urine is checked to make sure you do not have an infection, diabetes or protein in the urine.   Your weight is checked often to make sure gains are happening at the suggested rate. This is to ensure that both you and your baby are  growing normally.   Sometimes, an ultrasound is performed to confirm the proper growth and development of the baby. This is a test which bounces harmless sound waves off the baby so your caregiver can more accurately determine due dates.  Sometimes, a specialized test is done on the amniotic fluid surrounding the baby. This test is called an amniocentesis. The amniotic fluid is obtained by sticking a needle into the belly (abdomen). This is done to check the chromosomes in instances where there is a concern about possible genetic problems with the baby. It is also sometimes done near the end of pregnancy if an early delivery is required. In this case, it is done to help make sure the baby's lungs are mature enough for the baby to live outside of the womb. CHANGES OCCURING IN THE SECOND TRIMESTER OF PREGNANCY Your body goes through many changes during pregnancy. They vary from person to person. Talk to your caregiver about changes you notice that you are concerned about.  During the second trimester, you will likely have an increase in your appetite. It is normal to have cravings for certain foods. This varies from person to person and pregnancy to pregnancy.   Your lower abdomen will begin to bulge.   You may have to urinate more often because the uterus and baby are pressing on your bladder. It is also common to get more bladder infections during pregnancy (  pain with urination). You can help this by drinking lots of fluids and emptying your bladder before and after intercourse.   You may begin to get stretch marks on your hips, abdomen, and breasts. These are normal changes in the body during pregnancy. There are no exercises or medications to take that prevent this change.   You may begin to develop swollen and bulging veins (varicose veins) in your legs. Wearing support hose, elevating your feet for 15 minutes, 3 to 4 times a day and limiting salt in your diet helps lessen the problem.    Heartburn may develop as the uterus grows and pushes up against the stomach. Antacids recommended by your caregiver helps with this problem. Also, eating smaller meals 4 to 5 times a day helps.   Constipation can be treated with a stool softener or adding bulk to your diet. Drinking lots of fluids, vegetables, fruits, and whole grains are helpful.   Exercising is also helpful. If you have been very active up until your pregnancy, most of these activities can be continued during your pregnancy. If you have been less active, it is helpful to start an exercise program such as walking.   Hemorrhoids (varicose veins in the rectum) may develop at the end of the second trimester. Warm sitz baths and hemorrhoid cream recommended by your caregiver helps hemorrhoid problems.   Backaches may develop during this time of your pregnancy. Avoid heavy lifting, wear low heal shoes and practice good posture to help with backache problems.   Some pregnant women develop tingling and numbness of their hand and fingers because of swelling and tightening of ligaments in the wrist (carpel tunnel syndrome). This goes away after the baby is born.   As your breasts enlarge, you may have to get a bigger bra. Get a comfortable, cotton, support bra. Do not get a nursing bra until the last month of the pregnancy if you will be nursing the baby.   You may get a dark line from your belly button to the pubic area called the linea nigra.   You may develop rosy cheeks because of increase blood flow to the face.   You may develop spider looking lines of the face, neck, arms and chest. These go away after the baby is born.  HOME CARE INSTRUCTIONS   It is extremely important to avoid all smoking, herbs, alcohol, and unprescribed drugs during your pregnancy. These chemicals affect the formation and growth of the baby. Avoid these chemicals throughout the pregnancy to ensure the delivery of a healthy infant.   Most of your home  care instructions are the same as suggested for the first trimester of your pregnancy. Keep your caregiver's appointments. Follow your caregiver's instructions regarding medication use, exercise and diet.   During pregnancy, you are providing food for you and your baby. Continue to eat regular, well-balanced meals. Choose foods such as meat, fish, milk and other low fat dairy products, vegetables, fruits, and whole-grain breads and cereals. Your caregiver will tell you of the ideal weight gain.   A physical sexual relationship may be continued up until near the end of pregnancy if there are no other problems. Problems could include early (premature) leaking of amniotic fluid from the membranes, vaginal bleeding, abdominal pain, or other medical or pregnancy problems.   Exercise regularly if there are no restrictions. Check with your caregiver if you are unsure of the safety of some of your exercises. The greatest weight gain will occur in the   last 2 trimesters of pregnancy. Exercise will help you:   Control your weight.   Get you in shape for labor and delivery.   Lose weight after you have the baby.   Wear a good support or jogging bra for breast tenderness during pregnancy. This may help if worn during sleep. Pads or tissues may be used in the bra if you are leaking colostrum.   Do not use hot tubs, steam rooms or saunas throughout the pregnancy.   Wear your seat belt at all times when driving. This protects you and your baby if you are in an accident.   Avoid raw meat, uncooked cheese, cat litter boxes and soil used by cats. These carry germs that can cause birth defects in the baby.   The second trimester is also a good time to visit your dentist for your dental health if this has not been done yet. Getting your teeth cleaned is OK. Use a soft toothbrush. Brush gently during pregnancy.   It is easier to loose urine during pregnancy. Tightening up and strengthening the pelvic muscles will  help with this problem. Practice stopping your urination while you are going to the bathroom. These are the same muscles you need to strengthen. It is also the muscles you would use as if you were trying to stop from passing gas. You can practice tightening these muscles up 10 times a set and repeating this about 3 times per day. Once you know what muscles to tighten up, do not perform these exercises during urination. It is more likely to contribute to an infection by backing up the urine.   Ask for help if you have financial, counseling or nutritional needs during pregnancy. Your caregiver will be able to offer counseling for these needs as well as refer you for other special needs.   Your skin may become oily. If so, wash your face with mild soap, use non-greasy moisturizer and oil or cream based makeup.  MEDICATIONS AND DRUG USE IN PREGNANCY  Take prenatal vitamins as directed. The vitamin should contain 1 milligram of folic acid. Keep all vitamins out of reach of children. Only a couple vitamins or tablets containing iron may be fatal to a baby or young child when ingested.   Avoid use of all medications, including herbs, over-the-counter medications, not prescribed or suggested by your caregiver. Only take over-the-counter or prescription medicines for pain, discomfort, or fever as directed by your caregiver. Do not use aspirin.   Let your caregiver also know about herbs you may be using.   Alcohol is related to a number of birth defects. This includes fetal alcohol syndrome. All alcohol, in any form, should be avoided completely. Smoking will cause low birth rate and premature babies.   Street or illegal drugs are very harmful to the baby. They are absolutely forbidden. A baby born to an addicted mother will be addicted at birth. The baby will go through the same withdrawal an adult does.  SEEK MEDICAL CARE IF:  You have any concerns or worries during your pregnancy. It is better to call with  your questions if you feel they cannot wait, rather than worry about them. SEEK IMMEDIATE MEDICAL CARE IF:   An unexplained oral temperature above 102 F (38.9 C) develops, or as your caregiver suggests.   You have leaking of fluid from the vagina (birth canal). If leaking membranes are suspected, take your temperature and tell your caregiver of this when you call.   There   is vaginal spotting, bleeding, or passing clots. Tell your caregiver of the amount and how many pads are used. Light spotting in pregnancy is common, especially following intercourse.   You develop a bad smelling vaginal discharge with a change in the color from clear to white.   You continue to feel sick to your stomach (nauseated) and have no relief from remedies suggested. You vomit blood or coffee ground-like materials.   You lose more than 2 pounds of weight or gain more than 2 pounds of weight over 1 week, or as suggested by your caregiver.   You notice swelling of your face, hands, feet, or legs.   You get exposed to German measles and have never had them.   You are exposed to fifth disease or chickenpox.   You develop belly (abdominal) pain. Round ligament discomfort is a common non-cancerous (benign) cause of abdominal pain in pregnancy. Your caregiver still must evaluate you.   You develop a bad headache that does not go away.   You develop fever, diarrhea, pain with urination, or shortness of breath.   You develop visual problems, blurry, or double vision.   You fall or are in a car accident or any kind of trauma.   There is mental or physical violence at home.  Document Released: 05/28/2001 Document Revised: 02/13/2011 Document Reviewed: 11/30/2008 [employer] Patient Information 2012 [employer]. Birth Control Choices Birth control is the use of any practices, methods, or devices to prevent pregnancy from happening in a sexually active woman.  Below are some birth control choices to help avoid  pregnancy.  Not having sex (abstinence) is the surest form of birth control. This requires self-control. There is no risk of acquiring a sexually transmitted disease (STD), including acquired immunodeficiency syndrome (AIDS).   Periodic abstinence requires self-control during certain times of the month.   Calendar method, timing your menstrual periods from month to month.   Ovulation method is avoiding sexual intercourse around the time you produce an egg (ovulate).   Symptotherm method is avoiding sexual intercourse at the time of ovulation, using a thermometer and ovulation symptoms.   Post ovulation method is the timing of sexual intercourse after you ovulated.  These methods do not protect against STDs, including AIDS.  Birth control pills (BCPs) contain estrogen and progesterone hormone. These medicines work by stopping the egg from forming in the ovary (ovulation). Birth control pills are prescribed by a caregiver who will ask you questions about the risks of taking BCPs. Birth control pills do not protect against STDs, including AIDS.   "Minipill" birth control pills have only the progesterone hormone. They are taken every day of each month and must be prescribed by your caregiver. They do not protect against STDs, including AIDS.   Emergency contraception is often call the "morning after" pill. This pill can be taken right after sex or up to five days after sex if you think your birth control failed, you failed to use contraception, or you were forced to have sex. It is most effective the sooner you take the pills after having sexual intercourse. Do not use emergency contraception as your only form of birth control. Emergency contraceptive pills are available without a prescription. Check with your pharmacist.   Condoms are a thin sheath of latex, synthetic material, or lambskin worn over the penis during sexual intercourse. They can have a spermicide in or on them when you buy them.  Latex condoms can prevent pregnancy and STDs. "Natural" or lambskin   condoms can prevent pregnancy but may not protect against STDs, including AIDS.   Female condoms are a soft, loose-fitting sheath that is put into the vagina before sexual intercourse. They can prevent pregnancy and STDs, including AIDS.   Sponge is a soft, circular piece of polyurethane foam with spermicide in it that is inserted into the vagina after wetting it and before sexual intercourse. It does not require a prescription from your caregiver. It does not protect against STDs, including AIDS.   Diaphragm is a soft, latex, dome-shaped barrier that must be fitted by a caregiver. It is inserted into the vagina, along with a spermicidal jelly. After the proper fitting for a diaphragm, always insert the diaphragm before intercourse. The diaphragm should be left in the vagina for 6 to 8 hours after intercourse. Removal and reinsertion with a spermicide is always necessary after any use. It does not protect against STDs, including AIDS.   Progesterone-only injections are given every 3 months to prevent pregnancy. These injections contain synthetic progesterone and no estrogen. This hormone stops the ovaries from releasing eggs. It also causes the cervical mucus to thicken and changes the uterine lining. This makes it harder for sperm to survive in the uterus. It does not protect against STDs, including AIDS.   Birth Control Patch contains hormones similar to those in birth control pills, so effectiveness, risks, and side effects are similar. It must be changed once a week and is prescribed by a caregiver. It is less effective in very overweight women. It does not protect against STDs, including AIDS.   Vaginal Ring contains hormones similar to those in birth control pills. It is left in place for 3 weeks, removed for 1 week, and then a new one is put back into the vagina. It comes with a timer to put in your purse to help you remember when  to take it out or put a new one in. A caregiver's examination and prescription is necessary, just like with birth control pills and the patch. It does not protect against STDs, including AIDS.   Estrogen plus progesterone injections are given every 28 to 30 days. They can be given in the upper arm, thigh, or buttocks. It does not protect against STDs, including AIDS.   Intrauterine device (IUD): copper T or progestin filled is a T-shaped device that is put in a woman's uterus during a menstrual period to prevent pregnancy. The copper T IUD can last 10 years, and the progestin IUD can last 5 years. The progestin IUD can also help control heavy menstrual periods. It does not protect against STDs, including AIDS. The copper T IUD can be used as emergency contraception if inserted within 5 days of having unprotected intercourse.   Cervical cap is a round, soft latex or plastic cup that fits over the cervix and must be fitted by a caregiver. You do not need to use a spermicide with it or remove and insert it every time you have sexual intercourse. It does not protect against STDs, including AIDS.   Spermicides are chemicals that kill or block sperm from entering the cervix and uterus. They come in the form of creams, jellies, suppositories, foam, or tablets, and they do not require a prescription. They are inserted into the vagina with an applicator before having sexual intercourse. This must be repeated every time you have sexual intercourse.   Withdrawal is using the method of the female withdrawing his penis from sexual intercourse before he has a climax   and deposits his sperm. It does not protect against STDs, including AIDS.   Female tubal ligation is when the woman's fallopian tubes are surgically sealed or tied to prevent the egg from traveling to the uterus. It does not protect against STDs, including AIDS.   Female sterilization is when the female has his tubes that carry sperm tied off (vasectomy) to  stop sperm from entering the vagina during sexual intercourse. It does not protect against STDs, including AIDS.  Regardless of which method of birth control you choose, it is still important that you use some form of protection against STDs. Document Released: 06/03/2005 Document Revised: 07/06/2010 Document Reviewed: 04/20/2009 [employer] Patient Information 2012 [employer]. Breastfeeding BENEFITS OF BREASTFEEDING For the baby  The first milk (colostrum) helps the baby's digestive system function better.   There are antibodies from the mother in the milk that help the baby fight off infections.   The baby has a lower incidence of asthma, allergies, and SIDS (sudden infant death syndrome).   The nutrients in breast milk are better than formulas for the baby and helps the baby's brain grow better.   Babies who breastfeed have less gas, colic, and constipation.  For the mother  Breastfeeding helps develop a very special bond between mother and baby.   It is more convenient, always available at the correct temperature and cheaper than formula feeding.   It burns calories in the mother and helps with losing weight that was gained during pregnancy.   It makes the uterus contract back down to normal size faster and slows bleeding following delivery.   Breastfeeding mothers have a lower risk of developing breast cancer.  NURSE FREQUENTLY  A healthy, full-term baby may breastfeed as often as every hour or space his or her feedings to every 3 hours.   How often to nurse will vary from baby to baby. Watch your baby for signs of hunger, not the clock.   Nurse as often as the baby requests, or when you feel the need to reduce the fullness of your breasts.   Awaken the baby if it has been 3 to 4 hours since the last feeding.   Frequent feeding will help the mother make more milk and will prevent problems like sore nipples and engorgement of the breasts.  BABY'S POSITION AT THE  BREAST  Whether lying down or sitting, be sure that the baby's tummy is facing your tummy.   Support the breast with 4 fingers underneath the breast and the thumb above. Make sure your fingers are well away from the nipple and baby's mouth.   Stroke the baby's lips and cheek closest to the breast gently with your finger or nipple.   When the baby's mouth is open wide enough, place all of your nipple and as much of the dark area around the nipple as possible into your baby's mouth.   Pull the baby in close so the tip of the nose and the baby's cheeks touch the breast during the feeding.  FEEDINGS  The length of each feeding varies from baby to baby and from feeding to feeding.   The baby must suck about 2 to 3 minutes for your milk to get to him or her. This is called a "let down." For this reason, allow the baby to feed on each breast as long as he or she wants. Your baby will end the feeding when he or she has received the right balance of nutrients.   To   break the suction, put your finger into the corner of the baby's mouth and slide it between his or her gums before removing your breast from his or her mouth. This will help prevent sore nipples.  REDUCING BREAST ENGORGEMENT  In the first week after your baby is born, you may experience signs of breast engorgement. When breasts are engorged, they feel heavy, warm, full, and may be tender to the touch. You can reduce engorgement if you:   Nurse frequently, every 2 to 3 hours. Mothers who breastfeed early and often have fewer problems with engorgement.   Place light ice packs on your breasts between feedings. This reduces swelling. Wrap the ice packs in a lightweight towel to protect your skin.   Apply moist hot packs to your breast for 5 to 10 minutes before each feeding. This increases circulation and helps the milk flow.   Gently massage your breast before and during the feeding.   Make sure that the baby empties at least one breast  at every feeding before switching sides.   Use a breast pump to empty the breasts if your baby is sleepy or not nursing well. You may also want to pump if you are returning to work or or you feel you are getting engorged.   Avoid bottle feeds, pacifiers or supplemental feedings of water or juice in place of breastfeeding.   Be sure the baby is latched on and positioned properly while breastfeeding.   Prevent fatigue, stress, and anemia.   Wear a supportive bra, avoiding underwire styles.   Eat a balanced diet with enough fluids.  If you follow these suggestions, your engorgement should improve in 24 to 48 hours. If you are still experiencing difficulty, call your lactation consultant or caregiver. IS MY BABY GETTING ENOUGH MILK? Sometimes, mothers worry about whether their babies are getting enough milk. You can be assured that your baby is getting enough milk if:  The baby is actively sucking and you hear swallowing.   The baby nurses at least 8 to 12 times in a 24 hour time period. Nurse your baby until he or she unlatches or falls asleep at the first breast (at least 10 to 20 minutes), then offer the second side.   The baby is wetting 5 to 6 disposable diapers (6 to 8 cloth diapers) in a 24 hour period by 5 to 6 days of age.   The baby is having at least 2 to 3 stools every 24 hours for the first few months. Breast milk is all the food your baby needs. It is not necessary for your baby to have water or formula. In fact, to help your breasts make more milk, it is best not to give your baby supplemental feedings during the early weeks.   The stool should be soft and yellow.   The baby should gain 4 to 7 ounces per week after he is 4 days old.  TAKE CARE OF YOURSELF Take care of your breasts by:  Bathing or showering daily.   Avoiding the use of soaps on your nipples.   Start feedings on your left breast at one feeding and on your right breast at the next feeding.   You will  notice an increase in your milk supply 2 to 5 days after delivery. You may feel some discomfort from engorgement, which makes your breasts very firm and often tender. Engorgement "peaks" out within 24 to 48 hours. In the meantime, apply warm moist towels to your   breasts for 5 to 10 minutes before feeding. Gentle massage and expression of some milk before feeding will soften your breasts, making it easier for your baby to latch on. Wear a well fitting nursing bra and air dry your nipples for 10 to 15 minutes after each feeding.   Only use cotton bra pads.   Only use pure lanolin on your nipples after nursing. You do not need to wash it off before nursing.  Take care of yourself by:   Eating well-balanced meals and nutritious snacks.   Drinking milk, fruit juice, and water to satisfy your thirst (about 8 glasses a day).   Getting plenty of rest.   Increasing calcium in your diet (1200 mg a day).   Avoiding foods that you notice affect the baby in a bad way.  SEEK MEDICAL CARE IF:   You have any questions or difficulty with breastfeeding.   You need help.   You have a hard, red, sore area on your breast, accompanied by a fever of 100.5 F (38.1 C) or more.   Your baby is too sleepy to eat well or is having trouble sleeping.   Your baby is wetting less than 6 diapers per day, by 5 days of age.   Your baby's skin or white part of his or her eyes is more yellow than it was in the hospital.   You feel depressed.  Document Released: 06/03/2005 Document Revised: 02/13/2011 Document Reviewed: 01/16/2009 [employer] Patient Information 2012 [employer]. 

## 2011-05-17 ENCOUNTER — Ambulatory Visit (INDEPENDENT_AMBULATORY_CARE_PROVIDER_SITE_OTHER): Payer: Medicaid Other | Admitting: Family Medicine

## 2011-05-17 VITALS — BP 116/62 | Temp 98.2°F | Wt 164.0 lb

## 2011-05-17 DIAGNOSIS — J069 Acute upper respiratory infection, unspecified: Secondary | ICD-10-CM

## 2011-05-17 MED ORDER — AZITHROMYCIN 250 MG PO TABS: ORAL_TABLET | ORAL | Status: AC

## 2011-05-17 NOTE — Progress Notes (Signed)
S:  Here for evaluation of cough x 2 weeks.  + smoker, cough with sputum and sneezing.  No fever, chills, nausea or dyspnea. O: GEN: NAD, HEENT: wnl, Resp: CTAB,  CV: RRR A/P:  Bronchitis.  Likely viral.  Discussed if continues to have sputum, gave rx for azithromycin to fill.  Discussed importance of smoking cessation and given resources.  Discouraged OTC med use without consulting OB. In the future.

## 2011-05-17 NOTE — Patient Instructions (Signed)
If symptoms do not go away, take antibiotics  Stop taking Robitussin DM  Please consider quitting smoking for both you and  Your baby  Can make appointment with Dr. Raymondo Band to discuss tips for quitting.  Can also call 1-800-QUIT-NOW

## 2011-05-21 ENCOUNTER — Ambulatory Visit (INDEPENDENT_AMBULATORY_CARE_PROVIDER_SITE_OTHER): Payer: Medicaid Other | Admitting: Family Medicine

## 2011-05-21 DIAGNOSIS — Z23 Encounter for immunization: Secondary | ICD-10-CM

## 2011-05-21 DIAGNOSIS — Z3400 Encounter for supervision of normal first pregnancy, unspecified trimester: Secondary | ICD-10-CM

## 2011-05-21 MED ORDER — TETANUS-DIPHTH-ACELL PERTUSSIS 5-2.5-18.5 LF-MCG/0.5 IM SUSP: 0.5000 mL | Freq: Once | INTRAMUSCULAR | Status: DC

## 2011-05-21 NOTE — Progress Notes (Signed)
Doing well.  Feels better since taking zpack.

## 2011-05-21 NOTE — Patient Instructions (Signed)
Pregnancy - Second Trimester The second trimester of pregnancy (3 to 6 months) is a period of rapid growth for you and your baby. At the end of the sixth month, your baby is about 9 inches long and weighs 1 1/2 pounds. You will begin to feel the baby move between 18 and 20 weeks of the pregnancy. This is called quickening. Weight gain is faster. A clear fluid (colostrum) may leak out of your breasts. You may feel small contractions of the womb (uterus). This is known as false labor or Braxton-Hicks contractions. This is like a practice for labor when the baby is ready to be born. Usually, the problems with morning sickness have usually passed by the end of your first trimester. Some women develop small dark blotches (called cholasma, mask of pregnancy) on their face that usually goes away after the baby is born. Exposure to the sun makes the blotches worse. Acne may also develop in some pregnant women and pregnant women who have acne, may find that it goes away. PRENATAL EXAMS  Blood work may continue to be done during prenatal exams. These tests are done to check on your health and the probable health of your baby. Blood work is used to follow your blood levels (hemoglobin). Anemia (low hemoglobin) is common during pregnancy. Iron and vitamins are given to help prevent this. You will also be checked for diabetes between 24 and 28 weeks of the pregnancy. Some of the previous blood tests may be repeated.   The size of the uterus is measured during each visit. This is to make sure that the baby is continuing to grow properly according to the dates of the pregnancy.   Your blood pressure is checked every prenatal visit. This is to make sure you are not getting toxemia.   Your urine is checked to make sure you do not have an infection, diabetes or protein in the urine.   Your weight is checked often to make sure gains are happening at the suggested rate. This is to ensure that both you and your baby are  growing normally.   Sometimes, an ultrasound is performed to confirm the proper growth and development of the baby. This is a test which bounces harmless sound waves off the baby so your caregiver can more accurately determine due dates.  Sometimes, a specialized test is done on the amniotic fluid surrounding the baby. This test is called an amniocentesis. The amniotic fluid is obtained by sticking a needle into the belly (abdomen). This is done to check the chromosomes in instances where there is a concern about possible genetic problems with the baby. It is also sometimes done near the end of pregnancy if an early delivery is required. In this case, it is done to help make sure the baby's lungs are mature enough for the baby to live outside of the womb. CHANGES OCCURING IN THE SECOND TRIMESTER OF PREGNANCY Your body goes through many changes during pregnancy. They vary from person to person. Talk to your caregiver about changes you notice that you are concerned about.  During the second trimester, you will likely have an increase in your appetite. It is normal to have cravings for certain foods. This varies from person to person and pregnancy to pregnancy.   Your lower abdomen will begin to bulge.   You may have to urinate more often because the uterus and baby are pressing on your bladder. It is also common to get more bladder infections during pregnancy (  pain with urination). You can help this by drinking lots of fluids and emptying your bladder before and after intercourse.   You may begin to get stretch marks on your hips, abdomen, and breasts. These are normal changes in the body during pregnancy. There are no exercises or medications to take that prevent this change.   You may begin to develop swollen and bulging veins (varicose veins) in your legs. Wearing support hose, elevating your feet for 15 minutes, 3 to 4 times a day and limiting salt in your diet helps lessen the problem.    Heartburn may develop as the uterus grows and pushes up against the stomach. Antacids recommended by your caregiver helps with this problem. Also, eating smaller meals 4 to 5 times a day helps.   Constipation can be treated with a stool softener or adding bulk to your diet. Drinking lots of fluids, vegetables, fruits, and whole grains are helpful.   Exercising is also helpful. If you have been very active up until your pregnancy, most of these activities can be continued during your pregnancy. If you have been less active, it is helpful to start an exercise program such as walking.   Hemorrhoids (varicose veins in the rectum) may develop at the end of the second trimester. Warm sitz baths and hemorrhoid cream recommended by your caregiver helps hemorrhoid problems.   Backaches may develop during this time of your pregnancy. Avoid heavy lifting, wear low heal shoes and practice good posture to help with backache problems.   Some pregnant women develop tingling and numbness of their hand and fingers because of swelling and tightening of ligaments in the wrist (carpel tunnel syndrome). This goes away after the baby is born.   As your breasts enlarge, you may have to get a bigger bra. Get a comfortable, cotton, support bra. Do not get a nursing bra until the last month of the pregnancy if you will be nursing the baby.   You may get a dark line from your belly button to the pubic area called the linea nigra.   You may develop rosy cheeks because of increase blood flow to the face.   You may develop spider looking lines of the face, neck, arms and chest. These go away after the baby is born.  HOME CARE INSTRUCTIONS   It is extremely important to avoid all smoking, herbs, alcohol, and unprescribed drugs during your pregnancy. These chemicals affect the formation and growth of the baby. Avoid these chemicals throughout the pregnancy to ensure the delivery of a healthy infant.   Most of your home  care instructions are the same as suggested for the first trimester of your pregnancy. Keep your caregiver's appointments. Follow your caregiver's instructions regarding medication use, exercise and diet.   During pregnancy, you are providing food for you and your baby. Continue to eat regular, well-balanced meals. Choose foods such as meat, fish, milk and other low fat dairy products, vegetables, fruits, and whole-grain breads and cereals. Your caregiver will tell you of the ideal weight gain.   A physical sexual relationship may be continued up until near the end of pregnancy if there are no other problems. Problems could include early (premature) leaking of amniotic fluid from the membranes, vaginal bleeding, abdominal pain, or other medical or pregnancy problems.   Exercise regularly if there are no restrictions. Check with your caregiver if you are unsure of the safety of some of your exercises. The greatest weight gain will occur in the   last 2 trimesters of pregnancy. Exercise will help you:   Control your weight.   Get you in shape for labor and delivery.   Lose weight after you have the baby.   Wear a good support or jogging bra for breast tenderness during pregnancy. This may help if worn during sleep. Pads or tissues may be used in the bra if you are leaking colostrum.   Do not use hot tubs, steam rooms or saunas throughout the pregnancy.   Wear your seat belt at all times when driving. This protects you and your baby if you are in an accident.   Avoid raw meat, uncooked cheese, cat litter boxes and soil used by cats. These carry germs that can cause birth defects in the baby.   The second trimester is also a good time to visit your dentist for your dental health if this has not been done yet. Getting your teeth cleaned is OK. Use a soft toothbrush. Brush gently during pregnancy.   It is easier to loose urine during pregnancy. Tightening up and strengthening the pelvic muscles will  help with this problem. Practice stopping your urination while you are going to the bathroom. These are the same muscles you need to strengthen. It is also the muscles you would use as if you were trying to stop from passing gas. You can practice tightening these muscles up 10 times a set and repeating this about 3 times per day. Once you know what muscles to tighten up, do not perform these exercises during urination. It is more likely to contribute to an infection by backing up the urine.   Ask for help if you have financial, counseling or nutritional needs during pregnancy. Your caregiver will be able to offer counseling for these needs as well as refer you for other special needs.   Your skin may become oily. If so, wash your face with mild soap, use non-greasy moisturizer and oil or cream based makeup.  MEDICATIONS AND DRUG USE IN PREGNANCY  Take prenatal vitamins as directed. The vitamin should contain 1 milligram of folic acid. Keep all vitamins out of reach of children. Only a couple vitamins or tablets containing iron may be fatal to a baby or young child when ingested.   Avoid use of all medications, including herbs, over-the-counter medications, not prescribed or suggested by your caregiver. Only take over-the-counter or prescription medicines for pain, discomfort, or fever as directed by your caregiver. Do not use aspirin.   Let your caregiver also know about herbs you may be using.   Alcohol is related to a number of birth defects. This includes fetal alcohol syndrome. All alcohol, in any form, should be avoided completely. Smoking will cause low birth rate and premature babies.   Street or illegal drugs are very harmful to the baby. They are absolutely forbidden. A baby born to an addicted mother will be addicted at birth. The baby will go through the same withdrawal an adult does.  SEEK MEDICAL CARE IF:  You have any concerns or worries during your pregnancy. It is better to call with  your questions if you feel they cannot wait, rather than worry about them. SEEK IMMEDIATE MEDICAL CARE IF:   An unexplained oral temperature above 102 F (38.9 C) develops, or as your caregiver suggests.   You have leaking of fluid from the vagina (birth canal). If leaking membranes are suspected, take your temperature and tell your caregiver of this when you call.   There   is vaginal spotting, bleeding, or passing clots. Tell your caregiver of the amount and how many pads are used. Light spotting in pregnancy is common, especially following intercourse.   You develop a bad smelling vaginal discharge with a change in the color from clear to white.   You continue to feel sick to your stomach (nauseated) and have no relief from remedies suggested. You vomit blood or coffee ground-like materials.   You lose more than 2 pounds of weight or gain more than 2 pounds of weight over 1 week, or as suggested by your caregiver.   You notice swelling of your face, hands, feet, or legs.   You get exposed to [place] measles and have never had them.   You are exposed to fifth disease or chickenpox.   You develop belly (abdominal) pain. Round ligament discomfort is a common non-cancerous (benign) cause of abdominal pain in pregnancy. Your caregiver still must evaluate you.   You develop a bad headache that does not go away.   You develop fever, diarrhea, pain with urination, or shortness of breath.   You develop visual problems, blurry, or double vision.   You fall or are in a car accident or any kind of trauma.   There is mental or physical violence at home.  Document Released: 05/28/2001 Document Revised: 02/13/2011 Document Reviewed: 11/30/2008 [facility] Patient Information 2012 [place], [place]. Pregnancy and Smoking Smoking during pregnancy is very unhealthy for the mother and the developing fetus. The addictive drug in cigarettes (nicotine), carbon monoxide, and many other poisons are inhaled  from a cigarette and are carried through your bloodstream to your fetus. Cigarette smoke contains more than 2,500 chemicals. It is not known which of these chemicals are harmful to the developing fetus. However, both nicotine and carbon monoxide play a role in causing health problems in pregnancy. Effects on the fetus of smoking during pregnancy:  Decrease in blood flow and oxygen to the uterus, placenta, and your fetus.   Increased heart rate of the fetus.   Slowing of your fetus's growth in the uterus (intrauterine growth retardation).   Placental problems. Placenta may partially cover or completely cover the opening to the cervix (placenta previa), or the placenta may partially or completely separate from the uterus (placental abruption).   Increase risk of pregnancy outside of the uterus (tubal pregnancy).   Premature rupture membranes, causing the sac that holds the fetus to break too early, resulting in premature birth and increased health risks to the newborn.   Increased risk of birth defects, including heart defects.   Increased risk of miscarriage.  Newborns born to women who smoke during pregnancy:  Are more likely to be born too early (prematurely).   Are more likely to be at a low birth weight.   Are at risk for serious health problems, chronic or lifelong disabilities (cerebral palsy, mental retardation, learning problems), and possibly even death   Are at risk of Sudden Infant Death Syndrome (SIDS).   Have higher rates of miscarriage and stillbirth.   Have more lung and breathing (respiratory) problems.  Long-term effects on a child's behavior: Some of the following trends are seen with children of smoking mothers:  Increased risk for drug abuse, behavior, and conduct disorders.   Increased risk for smoking in adolescent girls.   Increased risk for negative behavior in 2-year-olds.   Increase risk for asthma, colic, and childhood obesity, which can lead to  diabetes.   Increased risk for finger and toe disorders.  Resources to stop smoking during pregnancy:  Counseling.   Psychological treatment.   Acupuncture.   Family intervention.   Hypnosis.   Medicines that are safe to take during pregnancy. Nicotine supplements have not been studied enough. They should only be considered when all other methods fail.   Telephone QUIT lines.  Smoking and Breastfeeding:  Nicotine gets passed to the infant through a mother's breastmilk. This can cause nausea, colic, cramping, and diarrhea in the infant.   Smoking may reduce milk supply and interfere with the let-down response.   Even formula-fed infants of mothers who smoke have nicotine and cotinine (nicotine by-product) in their urine.  Other resources to help stop smoking:  American Cancer Society: www.cancer.org   American Heart Association: www.americanheart.org   National Cancer Institute: www.cancer.gov   Smoke Free Families: www.smokefreefamilies.[address] [place]): (820)532-9964 START  Document Released: 10/15/2004 Document Revised: 02/13/2011 Document Reviewed: 03/15/2009 [facility] Patient Information 2012 [place], [place]. Birth Control Choices Birth control is the use of any practices, methods, or devices to prevent pregnancy from happening in a sexually active woman.  Below are some birth control choices to help avoid pregnancy.  Not having sex (abstinence) is the surest form of birth control. This requires self-control. There is no risk of acquiring a sexually transmitted disease (STD), including acquired immunodeficiency syndrome (AIDS).   Periodic abstinence requires self-control during certain times of the month.   Calendar method, timing your menstrual periods from month to month.   Ovulation method is avoiding sexual intercourse around the time you produce an egg (ovulate).   Symptotherm method is avoiding sexual intercourse at the time of ovulation, using a  thermometer and ovulation symptoms.   Post ovulation method is the timing of sexual intercourse after you ovulated.  These methods do not protect against STDs, including AIDS.  Birth control pills (BCPs) contain estrogen and progesterone hormone. These medicines work by stopping the egg from forming in the ovary (ovulation). Birth control pills are prescribed by a caregiver who will ask you questions about the risks of taking BCPs. Birth control pills do not protect against STDs, including AIDS.   "Minipill" birth control pills have only the progesterone hormone. They are taken every day of each month and must be prescribed by your caregiver. They do not protect against STDs, including AIDS.   Emergency contraception is often call the "morning after" pill. This pill can be taken right after sex or up to five days after sex if you think your birth control failed, you failed to use contraception, or you were forced to have sex. It is most effective the sooner you take the pills after having sexual intercourse. Do not use emergency contraception as your only form of birth control. Emergency contraceptive pills are available without a prescription. Check with your pharmacist.   Condoms are a thin sheath of latex, synthetic material, or lambskin worn over the penis during sexual intercourse. They can have a spermicide in or on them when you buy them. Latex condoms can prevent pregnancy and STDs. "Natural" or lambskin condoms can prevent pregnancy but may not protect against STDs, including AIDS.   Female condoms are a soft, loose-fitting sheath that is put into the vagina before sexual intercourse. They can prevent pregnancy and STDs, including AIDS.   Sponge is a soft, circular piece of polyurethane foam with spermicide in it that is inserted into the vagina after wetting it and before sexual intercourse. It does not require a prescription from  your caregiver. It does not protect against STDs, including  AIDS.   Diaphragm is a soft, latex, dome-shaped barrier that must be fitted by a caregiver. It is inserted into the vagina, along with a spermicidal jelly. After the proper fitting for a diaphragm, always insert the diaphragm before intercourse. The diaphragm should be left in the vagina for 6 to 8 hours after intercourse. Removal and reinsertion with a spermicide is always necessary after any use. It does not protect against STDs, including AIDS.   Progesterone-only injections are given every 3 months to prevent pregnancy. These injections contain synthetic progesterone and no estrogen. This hormone stops the ovaries from releasing eggs. It also causes the cervical mucus to thicken and changes the uterine lining. This makes it harder for sperm to survive in the uterus. It does not protect against STDs, including AIDS.   Birth Control Patch contains hormones similar to those in birth control pills, so effectiveness, risks, and side effects are similar. It must be changed once a week and is prescribed by a caregiver. It is less effective in very overweight women. It does not protect against STDs, including AIDS.   Vaginal Ring contains hormones similar to those in birth control pills. It is left in place for 3 weeks, removed for 1 week, and then a new one is put back into the vagina. It comes with a timer to put in your purse to help you remember when to take it out or put a new one in. A caregiver's examination and prescription is necessary, just like with birth control pills and the patch. It does not protect against STDs, including AIDS.   Estrogen plus progesterone injections are given every 28 to 30 days. They can be given in the upper arm, thigh, or buttocks. It does not protect against STDs, including AIDS.   Intrauterine device (IUD): copper T or progestin filled is a T-shaped device that is put in a woman's uterus during a menstrual period to prevent pregnancy. The copper T IUD can last 10 years,  and the progestin IUD can last 5 years. The progestin IUD can also help control heavy menstrual periods. It does not protect against STDs, including AIDS. The copper T IUD can be used as emergency contraception if inserted within 5 days of having unprotected intercourse.   Cervical cap is a round, soft latex or plastic cup that fits over the cervix and must be fitted by a caregiver. You do not need to use a spermicide with it or remove and insert it every time you have sexual intercourse. It does not protect against STDs, including AIDS.   Spermicides are chemicals that kill or block sperm from entering the cervix and uterus. They come in the form of creams, jellies, suppositories, foam, or tablets, and they do not require a prescription. They are inserted into the vagina with an applicator before having sexual intercourse. This must be repeated every time you have sexual intercourse.   Withdrawal is using the method of the female withdrawing his penis from sexual intercourse before he has a climax and deposits his sperm. It does not protect against STDs, including AIDS.   Female tubal ligation is when the woman's fallopian tubes are surgically sealed or tied to prevent the egg from traveling to the uterus. It does not protect against STDs, including AIDS.   Female sterilization is when the female has his tubes that carry sperm tied off (vasectomy) to stop sperm from entering the vagina during sexual intercourse.  It does not protect against STDs, including AIDS.  Regardless of which method of birth control you choose, it is still important that you use some form of protection against STDs. Document Released: 06/03/2005 Document Revised: 07/06/2010 Document Reviewed: 04/20/2009 [facility] Patient Information 2012 [place], [place]. Breastfeeding BENEFITS OF BREASTFEEDING For the baby  The first milk (colostrum) helps the baby's digestive system function better.   There are antibodies from the mother in the  milk that help the baby fight off infections.   The baby has a lower incidence of asthma, allergies, and SIDS (sudden infant death syndrome).   The nutrients in breast milk are better than formulas for the baby and helps the baby's brain grow better.   Babies who breastfeed have less gas, colic, and constipation.  For the mother  Breastfeeding helps develop a very special bond between mother and baby.   It is more convenient, always available at the correct temperature and cheaper than formula feeding.   It burns calories in the mother and helps with losing weight that was gained during pregnancy.   It makes the uterus contract back down to normal size faster and slows bleeding following delivery.   Breastfeeding mothers have a lower risk of developing breast cancer.  NURSE FREQUENTLY  A healthy, full-term baby may breastfeed as often as every hour or space his or her feedings to every 3 hours.   How often to nurse will vary from baby to baby. Watch your baby for signs of hunger, not the clock.   Nurse as often as the baby requests, or when you feel the need to reduce the fullness of your breasts.   Awaken the baby if it has been 3 to 4 hours since the last feeding.   Frequent feeding will help the mother make more milk and will prevent problems like sore nipples and engorgement of the breasts.  BABY'S POSITION AT THE BREAST  Whether lying down or sitting, be sure that the baby's tummy is facing your tummy.   Support the breast with 4 fingers underneath the breast and the thumb above. Make sure your fingers are well away from the nipple and baby's mouth.   Stroke the baby's lips and cheek closest to the breast gently with your finger or nipple.   When the baby's mouth is open wide enough, place all of your nipple and as much of the dark area around the nipple as possible into your baby's mouth.   Pull the baby in close so the tip of the nose and the baby's cheeks touch the  breast during the feeding.  FEEDINGS  The length of each feeding varies from baby to baby and from feeding to feeding.   The baby must suck about 2 to 3 minutes for your milk to get to him or her. This is called a "let down." For this reason, allow the baby to feed on each breast as long as he or she wants. Your baby will end the feeding when he or she has received the right balance of nutrients.   To break the suction, put your finger into the corner of the baby's mouth and slide it between his or her gums before removing your breast from his or her mouth. This will help prevent sore nipples.  REDUCING BREAST ENGORGEMENT  In the first week after your baby is born, you may experience signs of breast engorgement. When breasts are engorged, they feel heavy, warm, full, and may be tender to the  touch. You can reduce engorgement if you:   Nurse frequently, every 2 to 3 hours. Mothers who breastfeed early and often have fewer problems with engorgement.   Place light ice packs on your breasts between feedings. This reduces swelling. Wrap the ice packs in a lightweight towel to protect your skin.   Apply moist hot packs to your breast for 5 to 10 minutes before each feeding. This increases circulation and helps the milk flow.   Gently massage your breast before and during the feeding.   Make sure that the baby empties at least one breast at every feeding before switching sides.   Use a breast pump to empty the breasts if your baby is sleepy or not nursing well. You may also want to pump if you are returning to work or or you feel you are getting engorged.   Avoid bottle feeds, pacifiers or supplemental feedings of water or juice in place of breastfeeding.   Be sure the baby is latched on and positioned properly while breastfeeding.   Prevent fatigue, stress, and anemia.   Wear a supportive bra, avoiding underwire styles.   Eat a balanced diet with enough fluids.  If you follow these  suggestions, your engorgement should improve in 24 to 48 hours. If you are still experiencing difficulty, call your lactation consultant or caregiver. IS MY BABY GETTING ENOUGH MILK? Sometimes, mothers worry about whether their babies are getting enough milk. You can be assured that your baby is getting enough milk if:  The baby is actively sucking and you hear swallowing.   The baby nurses at least 8 to 12 times in a 24 hour time period. Nurse your baby until he or she unlatches or falls asleep at the first breast (at least 10 to 20 minutes), then offer the second side.   The baby is wetting 5 to 6 disposable diapers (6 to 8 cloth diapers) in a 24 hour period by 65 to 75 days of age.   The baby is having at least 2 to 3 stools every 24 hours for the first few months. Breast milk is all the food your baby needs. It is not necessary for your baby to have water or formula. In fact, to help your breasts make more milk, it is best not to give your baby supplemental feedings during the early weeks.   The stool should be soft and yellow.   The baby should gain 4 to 7 ounces per week after he is 56 days old.  TAKE CARE OF YOURSELF Take care of your breasts by:  Bathing or showering daily.   Avoiding the use of soaps on your nipples.   Start feedings on your left breast at one feeding and on your right breast at the next feeding.   You will notice an increase in your milk supply 2 to 5 days after delivery. You may feel some discomfort from engorgement, which makes your breasts very firm and often tender. Engorgement "peaks" out within 24 to 48 hours. In the meantime, apply warm moist towels to your breasts for 5 to 10 minutes before feeding. Gentle massage and expression of some milk before feeding will soften your breasts, making it easier for your baby to latch on. Wear a well fitting nursing bra and air dry your nipples for 10 to 15 minutes after each feeding.   Only use cotton bra pads.   Only  use pure lanolin on your nipples after nursing. You do not need to  wash it off before nursing.  Take care of yourself by:   Eating well-balanced meals and nutritious snacks.   Drinking milk, fruit juice, and water to satisfy your thirst (about 8 glasses a day).   Getting plenty of rest.   Increasing calcium in your diet (1200 mg a day).   Avoiding foods that you notice affect the baby in a bad way.  SEEK MEDICAL CARE IF:   You have any questions or difficulty with breastfeeding.   You need help.   You have a hard, red, sore area on your breast, accompanied by a fever of 100.5 F (38.1 C) or more.   Your baby is too sleepy to eat well or is having trouble sleeping.   Your baby is wetting less than 6 diapers per day, by 9 days of age.   Your baby's skin or white part of his or her eyes is more yellow than it was in the hospital.   You feel depressed.  Document Released: 06/03/2005 Document Revised: 02/13/2011 Document Reviewed: 01/16/2009 [facility] Patient Information 2012 [place], [place].

## 2011-06-09 ENCOUNTER — Inpatient Hospital Stay (HOSPITAL_COMMUNITY)
Admission: AD | Admit: 2011-06-09 | Discharge: 2011-06-09 | Disposition: A | Discharge status: Home / Self Care | Payer: Medicaid Other | Source: Ambulatory Visit | Attending: Obstetrics & Gynecology | Admitting: Obstetrics & Gynecology

## 2011-06-09 ENCOUNTER — Encounter (HOSPITAL_COMMUNITY): Payer: Self-pay | Admitting: *Deleted

## 2011-06-09 DIAGNOSIS — O99891 Other specified diseases and conditions complicating pregnancy: Secondary | ICD-10-CM | POA: Insufficient documentation

## 2011-06-09 DIAGNOSIS — N949 Unspecified condition associated with female genital organs and menstrual cycle: Secondary | ICD-10-CM

## 2011-06-09 DIAGNOSIS — R1031 Right lower quadrant pain: Secondary | ICD-10-CM | POA: Insufficient documentation

## 2011-06-09 NOTE — Progress Notes (Signed)
Pt sttes she has had cramping x 3 days-strted vomiting last night-states vomited 2 times today-also c/o of a cramping in her rt calf since 1 week ago that came and went-has been constant x 4 days now

## 2011-06-09 NOTE — ED Provider Notes (Signed)
History   in with c/o leg and abd pain in [facility] since this evening. Intake today water this am but Dr. Reino Kent since then.  Chief Complaint  Patient presents with  . Abdominal Pain   HPI  OB History    Grav Para Term Preterm Abortions TAB SAB Ect Mult Living   2 0 0 0 1 0 1 0 0 0       Past Medical History  Diagnosis Date  . Back pain   . Obesity   . Depression     no meds since pregancny  . Abnormal Pap smear     repeat WNL  . PONV (postoperative nausea and vomiting)     Past Surgical History  Procedure Date  . Tubes in ears age 32  . Tonsillectomy     age 32    Family History  Problem Relation Age of Onset  . Depression Mother   . Diabetes Mother   . Hypertension Mother   . COPD Mother   . Depression Sister   . Diabetes Sister   . Kidney disease Sister     History  Substance Use Topics  . Smoking status: Current Everyday Smoker -- 1.0 packs/day for 6 years    Types: Cigarettes  . Smokeless tobacco: Never Used  . Alcohol Use: No    Allergies:  Allergies  Allergen Reactions  . Keflex Itching and Nausea And Vomiting    Prescriptions prior to admission  Medication Sig Dispense Refill  . acetaminophen (TYLENOL) 325 MG tablet Take 650 mg by mouth every 6 (six) hours as needed. For pain       . desloratadine (CLARINEX) 5 MG tablet Take 1 tablet (5 mg total) by mouth daily.  30 tablet  2  . prenatal vitamin w/FE, FA (PRENATAL 1 + 1) 27-1 MG TABS Take 1 tablet by mouth daily.        . promethazine (PHENERGAN) 25 MG tablet Take 1 tablet (25 mg total) by mouth every 6 (six) hours as needed. For nausea  42 tablet  3    Review of Systems  Constitutional: Negative.   HENT: Negative.   Eyes: Negative.   Respiratory: Negative.   Cardiovascular: Negative.   Gastrointestinal: Negative.   Genitourinary: Negative.   Musculoskeletal: Negative.   Skin: Negative.   Neurological: Negative.   Endo/Heme/Allergies: Negative.   Psychiatric/Behavioral: Negative.     Physical Exam   Blood pressure 120/66, pulse 108, temperature 98.5 F (36.9 C), temperature source Oral, resp. rate 20, height 5\' 1"  (1.549 m), weight 74.844 kg (165 lb), last menstrual period 12/05/2010, SpO2 99.00%.  Physical Exam  Constitutional: She is oriented to person, place, and time. She appears well-developed and well-nourished.  HENT:  Head: Normocephalic.  Neck: Normal range of motion.  Cardiovascular: Normal rate, regular rhythm, normal heart sounds and intact distal pulses.   Respiratory: Effort normal and breath sounds normal.  GI: Soft. Bowel sounds are normal.  Genitourinary: Vagina normal and uterus normal.  Musculoskeletal: Normal range of motion.  Neurological: She is alert and oriented to person, place, and time. She has normal reflexes.  Skin: Skin is warm and dry.  Psychiatric: She has a normal mood and affect. Her behavior is normal. Judgment and thought content normal.    MAU Course  Procedures  MDM   Assessment and Plan  SVE cervix firm/cl/th/post/high. No contraction on monitor or palpated. Stable FHR pattern. D/c home, increase H2O.  Zerita Boers 06/09/2011, 7:52 PM

## 2011-06-18 NOTE — L&D Delivery Note (Signed)
Delivery Note At 8:26 PM a viable and healthy female was delivered via Vaginal, Spontaneous Delivery (Presentation: Middle Occiput Anterior).  APGAR: 9, ; weight 6 lb 2.9 oz (2805 g).   Placenta status: Intact, Spontaneous.  Cord: 3 vessels with the following complications: None.  Cord pH: not indicated   Anesthesia: Epidural  Episiotomy: None Lacerations: 1st degree;Perineal Suture Repair: 3.0 vicryl Est. Blood Loss (mL): 350  Mom to postpartum.  Baby to nursery-stable. Zerita Boers, CNM present throughout delivery, and performed repair  Raya Mckinstry 09/01/2011, 9:20 PM

## 2011-06-19 ENCOUNTER — Ambulatory Visit (INDEPENDENT_AMBULATORY_CARE_PROVIDER_SITE_OTHER): Payer: Medicaid Other | Admitting: Family Medicine

## 2011-06-19 VITALS — BP 136/72 | Wt 167.0 lb

## 2011-06-19 DIAGNOSIS — Z3400 Encounter for supervision of normal first pregnancy, unspecified trimester: Secondary | ICD-10-CM

## 2011-06-19 LAB — CBC
HCT: 30.3 % — ABNORMAL LOW (ref 36.0–46.0)
MCV: 90.2 fL (ref 78.0–100.0)
RBC: 3.36 MIL/uL — ABNORMAL LOW (ref 3.87–5.11)
WBC: 12 10*3/uL — ABNORMAL HIGH (ref 4.0–10.5)

## 2011-06-19 MED ORDER — FLUTICASONE PROPIONATE 50 MCG/ACT NA SUSP: 2.0000 | Freq: Every day | NASAL | Status: DC

## 2011-06-19 MED ORDER — MOMETASONE FUROATE 50 MCG/ACT NA SUSP: 2.0000 | Freq: Every day | NASAL | Status: DC

## 2011-06-19 NOTE — Progress Notes (Signed)
Addended by: Barbara Cower on: 06/19/2011 02:36 PM   Modules accepted: Orders

## 2011-06-19 NOTE — Patient Instructions (Signed)
Pregnancy - Third Trimester The third trimester of pregnancy (the last 3 months) is a period of the most rapid growth for you and your baby. The baby approaches a length of 20 inches and a weight of 6 to 10 pounds. The baby is adding on fat and getting ready for life outside your body. While inside, babies have periods of sleeping and waking, suck their thumbs, and hiccups. You can often feel small contractions of the uterus. This is false labor. It is also called Braxton-Hicks contractions. This is like a practice for labor. The usual problems in this stage of pregnancy include more difficulty breathing, swelling of the hands and feet from water retention, and having to urinate more often because of the uterus and baby pressing on your bladder.  PRENATAL EXAMS  Blood work may continue to be done during prenatal exams. These tests are done to check on your health and the probable health of your baby. Blood work is used to follow your blood levels (hemoglobin). Anemia (low hemoglobin) is common during pregnancy. Iron and vitamins are given to help prevent this. You may also continue to be checked for diabetes. Some of the past blood tests may be done again.   The size of the uterus is measured during each visit. This makes sure your baby is growing properly according to your pregnancy dates.   Your blood pressure is checked every prenatal visit. This is to make sure you are not getting toxemia.   Your urine is checked every prenatal visit for infection, diabetes and protein.   Your weight is checked at each visit. This is done to make sure gains are happening at the suggested rate and that you and your baby are growing normally.   Sometimes, an ultrasound is performed to confirm the position and the proper growth and development of the baby. This is a test done that bounces harmless sound waves off the baby so your caregiver can more accurately determine due dates.   Discuss the type of pain  medication and anesthesia you will have during your labor and delivery.   Discuss the possibility and anesthesia if a Cesarean Section might be necessary.   Inform your caregiver if there is any mental or physical violence at home.  Sometimes, a specialized non-stress test, contraction stress test and biophysical profile are done to make sure the baby is not having a problem. Checking the amniotic fluid surrounding the baby is called an amniocentesis. The amniotic fluid is removed by sticking a needle into the belly (abdomen). This is sometimes done near the end of pregnancy if an early delivery is required. In this case, it is done to help make sure the baby's lungs are mature enough for the baby to live outside of the womb. If the lungs are not mature and it is unsafe to deliver the baby, an injection of cortisone medication is given to the mother 1 to 2 days before the delivery. This helps the baby's lungs mature and makes it safer to deliver the baby. CHANGES OCCURING IN THE THIRD TRIMESTER OF PREGNANCY Your body goes through many changes during pregnancy. They vary from person to person. Talk to your caregiver about changes you notice and are concerned about.  During the last trimester, you have probably had an increase in your appetite. It is normal to have cravings for certain foods. This varies from person to person and pregnancy to pregnancy.   You may begin to get stretch marks on your hips,   abdomen, and breasts. These are normal changes in the body during pregnancy. There are no exercises or medications to take which prevent this change.   Constipation may be treated with a stool softener or adding bulk to your diet. Drinking lots of fluids, fiber in vegetables, fruits, and whole grains are helpful.   Exercising is also helpful. If you have been very active up until your pregnancy, most of these activities can be continued during your pregnancy. If you have been less active, it is helpful  to start an exercise program such as walking. Consult your caregiver before starting exercise programs.   Avoid all smoking, alcohol, un-prescribed drugs, herbs and "street drugs" during your pregnancy. These chemicals affect the formation and growth of the baby. Avoid chemicals throughout the pregnancy to ensure the delivery of a healthy infant.   Backache, varicose veins and hemorrhoids may develop or get worse.   You will tire more easily in the third trimester, which is normal.   The baby's movements may be stronger and more often.   You may become short of breath easily.   Your belly button may stick out.   A yellow discharge may leak from your breasts called colostrum.   You may have a bloody mucus discharge. This usually occurs a few days to a week before labor begins.  HOME CARE INSTRUCTIONS   Keep your caregiver's appointments. Follow your caregiver's instructions regarding medication use, exercise, and diet.   During pregnancy, you are providing food for you and your baby. Continue to eat regular, well-balanced meals. Choose foods such as meat, fish, milk and other low fat dairy products, vegetables, fruits, and whole-grain breads and cereals. Your caregiver will tell you of the ideal weight gain.   A physical sexual relationship may be continued throughout pregnancy if there are no other problems such as early (premature) leaking of amniotic fluid from the membranes, vaginal bleeding, or belly (abdominal) pain.   Exercise regularly if there are no restrictions. Check with your caregiver if you are unsure of the safety of your exercises. Greater weight gain will occur in the last 2 trimesters of pregnancy. Exercising helps:   Control your weight.   Get you in shape for labor and delivery.   You lose weight after you deliver.   Rest a lot with legs elevated, or as needed for leg cramps or low back pain.   Wear a good support or jogging bra for breast tenderness during  pregnancy. This may help if worn during sleep. Pads or tissues may be used in the bra if you are leaking colostrum.   Do not use hot tubs, steam rooms, or saunas.   Wear your seat belt when driving. This protects you and your baby if you are in an accident.   Avoid raw meat, cat litter boxes and soil used by cats. These carry germs that can cause birth defects in the baby.   It is easier to loose urine during pregnancy. Tightening up and strengthening the pelvic muscles will help with this problem. You can practice stopping your urination while you are going to the bathroom. These are the same muscles you need to strengthen. It is also the muscles you would use if you were trying to stop from passing gas. You can practice tightening these muscles up 10 times a set and repeating this about 3 times per day. Once you know what muscles to tighten up, do not perform these exercises during urination. It is more likely   to cause an infection by backing up the urine.   Ask for help if you have financial, counseling or nutritional needs during pregnancy. Your caregiver will be able to offer counseling for these needs as well as refer you for other special needs.   Make a list of emergency phone numbers and have them available.   Plan on getting help from family or friends when you go home from the hospital.   Make a trial run to the hospital.   Take prenatal classes with the father to understand, practice and ask questions about the labor and delivery.   Prepare the baby's room/nursery.   Do not travel out of the city unless it is absolutely necessary and with the advice of your caregiver.   Wear only low or no heal shoes to have better balance and prevent falling.  MEDICATIONS AND DRUG USE IN PREGNANCY  Take prenatal vitamins as directed. The vitamin should contain 1 milligram of folic acid. Keep all vitamins out of reach of children. Only a couple vitamins or tablets containing iron may be fatal  to a baby or young child when ingested.   Avoid use of all medications, including herbs, over-the-counter medications, not prescribed or suggested by your caregiver. Only take over-the-counter or prescription medicines for pain, discomfort, or fever as directed by your caregiver. Do not use aspirin, ibuprofen (Motrin, Advil, Nuprin) or naproxen (Aleve) unless OK'd by your caregiver.   Let your caregiver also know about herbs you may be using.   Alcohol is related to a number of birth defects. This includes fetal alcohol syndrome. All alcohol, in any form, should be avoided completely. Smoking will cause low birth rate and premature babies.   Street/illegal drugs are very harmful to the baby. They are absolutely forbidden. A baby born to an addicted mother will be addicted at birth. The baby will go through the same withdrawal an adult does.  SEEK MEDICAL CARE IF: You have any concerns or worries during your pregnancy. It is better to call with your questions if you feel they cannot wait, rather than worry about them. DECISIONS ABOUT CIRCUMCISION You may or may not know the sex of your baby. If you know your baby is a boy, it may be time to think about circumcision. Circumcision is the removal of the foreskin of the penis. This is the skin that covers the sensitive end of the penis. There is no proven medical need for this. Often this decision is made on what is popular at the time or based upon religious beliefs and social issues. You can discuss these issues with your caregiver or pediatrician. SEEK IMMEDIATE MEDICAL CARE IF:   An unexplained oral temperature above 102 F (38.9 C) develops, or as your caregiver suggests.   You have leaking of fluid from the vagina (birth canal). If leaking membranes are suspected, take your temperature and tell your caregiver of this when you call.   There is vaginal spotting, bleeding or passing clots. Tell your caregiver of the amount and how many pads are  used.   You develop a bad smelling vaginal discharge with a change in the color from clear to white.   You develop vomiting that lasts more than 24 hours.   You develop chills or fever.   You develop shortness of breath.   You develop burning on urination.   You loose more than 2 pounds of weight or gain more than 2 pounds of weight or as suggested by your   caregiver.   You notice sudden swelling of your face, hands, and feet or legs.   You develop belly (abdominal) pain. Round ligament discomfort is a common non-cancerous (benign) cause of abdominal pain in pregnancy. Your caregiver still must evaluate you.   You develop a severe headache that does not go away.   You develop visual problems, blurred or double vision.   If you have not felt your baby move for more than 1 hour. If you think the baby is not moving as much as usual, eat something with sugar in it and lie down on your left side for an hour. The baby should move at least 4 to 5 times per hour. Call right away if your baby moves less than that.   You fall, are in a car accident or any kind of trauma.   There is mental or physical violence at home.  Document Released: 05/28/2001 Document Revised: 02/13/2011 Document Reviewed: 11/30/2008 [facility] Patient Information 2012 [place], [place]. Pregnancy and Smoking Smoking during pregnancy is very unhealthy for the mother and the developing fetus. The addictive drug in cigarettes (nicotine), carbon monoxide, and many other poisons are inhaled from a cigarette and are carried through your bloodstream to your fetus. Cigarette smoke contains more than 2,500 chemicals. It is not known which of these chemicals are harmful to the developing fetus. However, both nicotine and carbon monoxide play a role in causing health problems in pregnancy. Effects on the fetus of smoking during pregnancy:  Decrease in blood flow and oxygen to the uterus, placenta, and your fetus.   Increased heart  rate of the fetus.   Slowing of your fetus's growth in the uterus (intrauterine growth retardation).   Placental problems. Placenta may partially cover or completely cover the opening to the cervix (placenta previa), or the placenta may partially or completely separate from the uterus (placental abruption).   Increase risk of pregnancy outside of the uterus (tubal pregnancy).   Premature rupture membranes, causing the sac that holds the fetus to break too early, resulting in premature birth and increased health risks to the newborn.   Increased risk of birth defects, including heart defects.   Increased risk of miscarriage.  Newborns born to women who smoke during pregnancy:  Are more likely to be born too early (prematurely).   Are more likely to be at a low birth weight.   Are at risk for serious health problems, chronic or lifelong disabilities (cerebral palsy, mental retardation, learning problems), and possibly even death   Are at risk of Sudden Infant Death Syndrome (SIDS).   Have higher rates of miscarriage and stillbirth.   Have more lung and breathing (respiratory) problems.  Long-term effects on a child's behavior: Some of the following trends are seen with children of smoking mothers:  Increased risk for drug abuse, behavior, and conduct disorders.   Increased risk for smoking in adolescent girls.   Increased risk for negative behavior in 2-year-olds.   Increase risk for asthma, colic, and childhood obesity, which can lead to diabetes.   Increased risk for finger and toe disorders.  Resources to stop smoking during pregnancy:  Counseling.   Psychological treatment.   Acupuncture.   Family intervention.   Hypnosis.   Medicines that are safe to take during pregnancy. Nicotine supplements have not been studied enough. They should only be considered when all other methods fail.   Telephone QUIT lines.  Smoking and Breastfeeding:  Nicotine gets passed to  the infant through a  mother's breastmilk. This can cause nausea, colic, cramping, and diarrhea in the infant.   Smoking may reduce milk supply and interfere with the let-down response.   Even formula-fed infants of mothers who smoke have nicotine and cotinine (nicotine by-product) in their urine.  Other resources to help stop smoking:  American Cancer Society: www.cancer.org   American Heart Association: www.americanheart.org   National Cancer Institute: www.cancer.gov   Smoke Free Families: www.smokefreefamilies.[address] [place]): (613)485-0375 START  Document Released: 10/15/2004 Document Revised: 02/13/2011 Document Reviewed: 03/15/2009 [facility] Patient Information 2012 [place], [place]. Birth Control Choices Birth control is the use of any practices, methods, or devices to prevent pregnancy from happening in a sexually active woman.  Below are some birth control choices to help avoid pregnancy.  Not having sex (abstinence) is the surest form of birth control. This requires self-control. There is no risk of acquiring a sexually transmitted disease (STD), including acquired immunodeficiency syndrome (AIDS).   Periodic abstinence requires self-control during certain times of the month.   Calendar method, timing your menstrual periods from month to month.   Ovulation method is avoiding sexual intercourse around the time you produce an egg (ovulate).   Symptotherm method is avoiding sexual intercourse at the time of ovulation, using a thermometer and ovulation symptoms.   Post ovulation method is the timing of sexual intercourse after you ovulated.  These methods do not protect against STDs, including AIDS.  Birth control pills (BCPs) contain estrogen and progesterone hormone. These medicines work by stopping the egg from forming in the ovary (ovulation). Birth control pills are prescribed by a caregiver who will ask you questions about the risks of taking BCPs. Birth control  pills do not protect against STDs, including AIDS.   "Minipill" birth control pills have only the progesterone hormone. They are taken every day of each month and must be prescribed by your caregiver. They do not protect against STDs, including AIDS.   Emergency contraception is often call the "morning after" pill. This pill can be taken right after sex or up to five days after sex if you think your birth control failed, you failed to use contraception, or you were forced to have sex. It is most effective the sooner you take the pills after having sexual intercourse. Do not use emergency contraception as your only form of birth control. Emergency contraceptive pills are available without a prescription. Check with your pharmacist.   Condoms are a thin sheath of latex, synthetic material, or lambskin worn over the penis during sexual intercourse. They can have a spermicide in or on them when you buy them. Latex condoms can prevent pregnancy and STDs. "Natural" or lambskin condoms can prevent pregnancy but may not protect against STDs, including AIDS.   Female condoms are a soft, loose-fitting sheath that is put into the vagina before sexual intercourse. They can prevent pregnancy and STDs, including AIDS.   Sponge is a soft, circular piece of polyurethane foam with spermicide in it that is inserted into the vagina after wetting it and before sexual intercourse. It does not require a prescription from your caregiver. It does not protect against STDs, including AIDS.   Diaphragm is a soft, latex, dome-shaped barrier that must be fitted by a caregiver. It is inserted into the vagina, along with a spermicidal jelly. After the proper fitting for a diaphragm, always insert the diaphragm before intercourse. The diaphragm should be left in the vagina for 6 to 8 hours after intercourse. Removal and  reinsertion with a spermicide is always necessary after any use. It does not protect against STDs, including AIDS.     Progesterone-only injections are given every 3 months to prevent pregnancy. These injections contain synthetic progesterone and no estrogen. This hormone stops the ovaries from releasing eggs. It also causes the cervical mucus to thicken and changes the uterine lining. This makes it harder for sperm to survive in the uterus. It does not protect against STDs, including AIDS.   Birth Control Patch contains hormones similar to those in birth control pills, so effectiveness, risks, and side effects are similar. It must be changed once a week and is prescribed by a caregiver. It is less effective in very overweight women. It does not protect against STDs, including AIDS.   Vaginal Ring contains hormones similar to those in birth control pills. It is left in place for 3 weeks, removed for 1 week, and then a new one is put back into the vagina. It comes with a timer to put in your purse to help you remember when to take it out or put a new one in. A caregiver's examination and prescription is necessary, just like with birth control pills and the patch. It does not protect against STDs, including AIDS.   Estrogen plus progesterone injections are given every 28 to 30 days. They can be given in the upper arm, thigh, or buttocks. It does not protect against STDs, including AIDS.   Intrauterine device (IUD): copper T or progestin filled is a T-shaped device that is put in a woman's uterus during a menstrual period to prevent pregnancy. The copper T IUD can last 10 years, and the progestin IUD can last 5 years. The progestin IUD can also help control heavy menstrual periods. It does not protect against STDs, including AIDS. The copper T IUD can be used as emergency contraception if inserted within 5 days of having unprotected intercourse.   Cervical cap is a round, soft latex or plastic cup that fits over the cervix and must be fitted by a caregiver. You do not need to use a spermicide with it or remove and insert  it every time you have sexual intercourse. It does not protect against STDs, including AIDS.   Spermicides are chemicals that kill or block sperm from entering the cervix and uterus. They come in the form of creams, jellies, suppositories, foam, or tablets, and they do not require a prescription. They are inserted into the vagina with an applicator before having sexual intercourse. This must be repeated every time you have sexual intercourse.   Withdrawal is using the method of the female withdrawing his penis from sexual intercourse before he has a climax and deposits his sperm. It does not protect against STDs, including AIDS.   Female tubal ligation is when the woman's fallopian tubes are surgically sealed or tied to prevent the egg from traveling to the uterus. It does not protect against STDs, including AIDS.   Female sterilization is when the female has his tubes that carry sperm tied off (vasectomy) to stop sperm from entering the vagina during sexual intercourse. It does not protect against STDs, including AIDS.  Regardless of which method of birth control you choose, it is still important that you use some form of protection against STDs. Document Released: 06/03/2005 Document Revised: 07/06/2010 Document Reviewed: 04/20/2009 [facility] Patient Information 2012 [place], [place]. Postpartum Depression After delivery, your body is going through a drastic change in hormone levels. You may find yourself crying  for no apparent reason and unable to cope with all the changes a new baby brings. This is a common response following a pregnancy. Seek support from your partner and/or friends and just give yourself time to recover. If these feelings persist and you feel you are getting worse, contact your caregiver or other professionals who can help you. WHAT IS DEPRESSION? Depression can be described as feeling sad, blue, unhappy, miserable, or down in the dumps. Most of [place] feel this way at one time or another  for short periods. But true clinical depression is a mood disorder in which feelings of sadness, loss, anger, fear, or frustration interfere with everyday life for an extended time. Depression can be mild, moderate, or severe. The degree of depression, which your caregiver can determine, influences your treatment. Postpartum depression occurs within a couple days to months after delivering your baby. HOW COMMON IS DEPRESSION DURING AND AFTER PREGNANCY? Depression that occurs during pregnancy or within a year after delivery is called perinatal depression. Depression after pregnancy is also called postpartum depression or peripartum depression. The exact number of women with depression during this time is unknown, but it occurs in between 10-15% of women. Researchers believe that depression is one of the most common complications during and after pregnancy. The depression is often not recognized or treated, because some normal pregnancy changes cause similar symptoms and are happening at the same time. Tiredness, problems sleeping, stronger emotional reactions, and changes in body weight may occur during and after pregnancy. But these symptoms may also be signs of depression.  CAUSES  Rapid hormone changes. Estrogen and progesterone usually decrease immediately after delivering your baby. Researchers think the fast change in hormone levels may lead to depression, just as smaller changes in hormones can affect a woman's moods before she gets her menstrual period.   Decrease in thyroid hormone. Thyroid hormone regulates how your body uses and stores energy from food (metabolism). A simple blood test can tell if this condition is causing a woman's depression. If so, thyroid medicine can be prescribed by your caregiver.   A stressful life event, such as a death in the family. This can cause chemical changes in the brain that lead to depression.   Feeling overwhelmed by caring for and raising a new baby.    Depression is also an illness that runs in some families. It is not always clear what causes depression.  FACTORS THAT MAY INCREASE A WOMAN'S CHANCE OF DEPRESSION DURING PREGNANCY:  History of depression.   Substance abuse, alcohol, or drugs.   Little support from family and friends.   Problems with previous pregnancy or birth.   Young age for motherhood.   Living alone.   Little or no social support.   Family history of mental illness.   Anxiety about the fetus.   Marital or financial problems.   Postpartum depression in a previous pregnancy.   Having a psychiatric illness (schizophrenia, bipolar disorder).   Going through a difficult or stressful pregnancy.   Going through a difficult labor and delivery.   Moving to another city or state during your pregnancy, or just after delivering your baby.  OTHER FACTORS THAT MAY CONTRIBUTE TO POSTPARTUM DEPRESSION INCLUDE:   Feeling tired after delivery, broken sleep patterns, and not getting enough rest. This often keeps a new mother from regaining her full strength for weeks.   Feeling overwhelmed with a new baby to take care of and doubting your ability to be a good mother.  Feeling stress from changes in work and home routines. Women sometimes think they need to be "super mom" or perfect. This is not realistic and can add stress.   Having feelings of loss. This can include loss of the identity of who you are, or were, before having the baby, loss of control, loss of your pre-pregnancy figure, and feeling less attractive.   Having less free time and less control over your time. Needing to stay home, indoors, for longer periods of time and having less time to spend with your partner and loved ones can contribute to depression.   Having trouble doing your daily activities at home or at work.   Fears about not knowing how to take of the baby correctly and about harming the baby.   Feelings of guilt that you are not taking  care of the baby properly.  SYMPTOMS Any of these symptoms, during and after pregnancy, that last longer than 2 weeks are signs of depression:  Feeling restless or irritable.   Feeling sad, hopeless, and overwhelmed.   Crying a lot.   Having no energy or motivation.   Eating too little or too much.   Sleeping too little or too much.   Trouble focusing, remembering, or making decisions.   Feeling worthless and guilty.   Loss of interest or pleasure in activities.   Withdrawal from friends and family.   Having headaches, chest pains, rapid or irregular heartbeat (palpitations), or fast and shallow breathing (hyperventilation).   After pregnancy, being afraid of hurting the baby or oneself, and not having any interest in the baby.   Not being able to care for yourself or the baby.   Loss of interest in caring for the baby.   Anxiety and panic attacks.   Thoughts of harming yourself, the baby, or someone else.   Feelings of guilt because you feel you are not taking care of the baby well enough.  WHAT IS THE DIFFERENCE BETWEEN "BABY BLUES," POSTPARTUM DEPRESSION, AND POSTPARTUM PSYCHOSIS?  The "baby blues" occurs 70 to 80% of the time, and it can happen in the days right after childbirth. It normally goes away within a few days to a week. A new mother can have sudden mood swings, sadness, crying spells, loss of appetite, sleeping problems, and feel irritable, restless, anxious, and lonely. Symptoms are not severe and treatment usually is not needed. But there are things you can do to feel better. Nap when the baby does. Ask for help from your spouse, family members, and friends. Join a support group of new moms or talk with other moms. If the "baby blues" does not go away in a week to 10 days or gets worse, you may have postpartum depression.   Postpartum depression can happen anytime within the first year after childbirth. A woman may have a number of symptoms, such as sadness,  lack of energy, trouble concentrating, anxiety, and feelings of guilt and worthlessness. The difference between postpartum depression and the "baby blues" is that the feelings in postpartum depression are much stronger and often affects a woman's well-being. It keeps her from functioning well for a longer period of time. Postpartum depression needs to be treated by a caregiver. Counseling, support groups, and medicines can help.   Postpartum psychosis is rare. It occurs in 1 or 2 out of every 1000 births. It usually begins in the first 6 weeks after delivery. Women who have bipolar disorder, schizoaffective disorder, or family history of psychotic disease have  a higher risk for developing postpartum psychosis. Symptoms may include delusions, hallucinations, sleep disturbances, and obsessive thoughts about the baby. A woman may have rapid mood swings, from depression, to irritability, to euphoria. This is a serious condition and needs professional care and treatment.  WHAT STEPS CAN I TAKE IF I HAVE SYMPTOMS OF DEPRESSION DURING PREGNANCY OR AFTER CHILDBIRTH?  Some women do not tell anyone about their symptoms, because they feel embarrassed, ashamed, or guilty about feeling depressed when they are supposed to be happy. They worry that they will be viewed as unfit parents. Perinatal depression can happen to any woman. It does not mean you are a bad or a "not together" mom. You and your baby do not need to suffer. There is help. You should discuss these feelings with your spouse or partner, family, and caregiver.   There are different types of individual and group "talk therapies" that can help a woman with perinatal depression feel better and do better as a mom and as a person. Limited research suggests that many women with perinatal depression improve when treated with antidepressant medicine. Your caregiver can help you learn more about these options and decide which approach is best for you and your baby.     Speak to your caregiver if you are having symptoms of depression while you are pregnant or after you deliver your baby. Your caregiver can give you a questionnaire to test for depression. You can also be referred to a mental health professional who specializes in treating depression.  HOME CARE INSTRUCTIONS  Try to get as much rest as you can. Try to nap when the baby naps.   Stop putting pressure on yourself to do everything. Do as much as you can and leave the rest.   Ask for help with household chores and nighttime feedings. Ask your partner to bring the baby to you so you can breastfeed. If you can, have a friend, family member, or professional support person help you in the home for part of the day.   Talk to your partner, family, and friends about how you are feeling.   Do not spend a lot of time alone. Get dressed and leave the house. Run an errand or take a short walk.   Spend time alone with your partner.   Talk with other mothers so you can learn from their experiences.   Join a support group for women with depression. Call a local hotline or look in your telephone book for information and services.   Do not make any major life changes during pregnancy. Major changes can cause unneeded stress. However, sometimes big changes cannot be avoided. Arrange support and help in your new situation ahead of time.   Exercise regularly.   Eat a balanced and nourishing diet.   Seek help if there are marital or financial problems.   Take the medicine your caregiver gives, as directed.   Keep all your postpartum appointments.  TREATMENT There are 2 common types of treatment for depression.  Talk therapy. This involves talking to a therapist, psychologist, clergyperson, or social worker, in order to learn to change how depression makes you think, feel, and act.   Medicine. Your caregiver can give you an antidepressant medicine to help you. These medicines can help relieve the  symptoms of depression.   Women who are pregnant or breast-feeding should talk with their caregivers about the advantages and risks of taking antidepressant medicines. Some women are concerned that taking these medicines  may harm the baby. A mother's depression can affect her baby's development. Getting treatment is important for both mother and baby. The risks of taking medicine must be weighed against the risks of depression. It is a decision that women need to discuss carefully with their caregivers. Women who decide to take antidepressant medicines should talk to their caregivers about which antidepressant medicines are safer to take while pregnant or breastfeeding.  What effects can untreated depression have?  Depression not only hurts the mother, but it also affects her family. Some researchers have found that depression during pregnancy can raise the risk of delivering an underweight baby or a premature infant. Some women with depression have difficulty caring for themselves during pregnancy. They may have trouble eating and do not gain enough weight during the pregnancy. They may also have trouble sleeping, may miss prenatal visits, may not follow medical instructions, have a poor diet, or may use harmful substances, like tobacco, alcohol, or illegal drugs.   Postpartum depression can affect a mother's ability to parent. She may lack energy, have trouble concentrating, be irritable, and not be able to meet her child's needs for love and affection. As a result, she may feel guilty and lose confidence in herself as a mother. This can make the depression worse. Researchers believe that postpartum depression can affect the infant by causing delays in language development, problems with emotional bonding to others, behavioral problems, lower activity levels, sleep problems, and distress. It helps if the father or another caregiver can assist in meeting the needs of the baby, and other children in the  family, while the mother is depressed.   All children deserve the chance to have a healthy mom. All moms deserve the chance to enjoy their life and their children. Do not suffer alone. If you are experiencing symptoms of depression during pregnancy or after having a baby, tell a loved one and call your caregiver right away.  SEEK MEDICAL CARE IF:  You think you have postpartum depression.   You want medicine to treat your postpartum depression.   You want a referral to a psychiatrist or psychologist.   You are having a reaction or problems with your medicine.  SEEK IMMEDIATE MEDICAL CARE IF:  You have suicidal feelings.   You feel you may harm the baby.   You feel you may harm your spouse/partner, or someone else.   You feel you need to be admitted to a hospital now.   You feel you are losing control and need treatment immediately.  FOR MORE INFORMATION [employer] Health Information Center: http://hoffman.com/ National Institute of Mental Health, NIH, HHS: http://www.maynard.net/ American Psychological Association: DiceTournament.ca  Postpartum Education for Parents: www.sbpep.org National Mental Health Information Center, SAMHSA, HHS: www.mentalhealth.org  National Mental Health Association: www.nmha.org Postpartum Support International: www.postpartum.net  Document Released: 03/07/2004 Document Revised: 02/13/2011 Document Reviewed: 06/15/2009 [facility] Patient Information 2012 [place], [place]. Breastfeeding BENEFITS OF BREASTFEEDING For the baby  The first milk (colostrum) helps the baby's digestive system function better.   There are antibodies from the mother in the milk that help the baby fight off infections.   The baby has a lower incidence of asthma, allergies, and SIDS (sudden infant death syndrome).   The nutrients in breast milk are better than formulas for the baby and helps the baby's brain grow better.   Babies who breastfeed have less gas, colic, and  constipation.  For the mother  Breastfeeding helps develop a very special bond between mother and baby.  It is more convenient, always available at the correct temperature and cheaper than formula feeding.   It burns calories in the mother and helps with losing weight that was gained during pregnancy.   It makes the uterus contract back down to normal size faster and slows bleeding following delivery.   Breastfeeding mothers have a lower risk of developing breast cancer.  NURSE FREQUENTLY  A healthy, full-term baby may breastfeed as often as every hour or space his or her feedings to every 3 hours.   How often to nurse will vary from baby to baby. Watch your baby for signs of hunger, not the clock.   Nurse as often as the baby requests, or when you feel the need to reduce the fullness of your breasts.   Awaken the baby if it has been 3 to 4 hours since the last feeding.   Frequent feeding will help the mother make more milk and will prevent problems like sore nipples and engorgement of the breasts.  BABY'S POSITION AT THE BREAST  Whether lying down or sitting, be sure that the baby's tummy is facing your tummy.   Support the breast with 4 fingers underneath the breast and the thumb above. Make sure your fingers are well away from the nipple and baby's mouth.   Stroke the baby's lips and cheek closest to the breast gently with your finger or nipple.   When the baby's mouth is open wide enough, place all of your nipple and as much of the dark area around the nipple as possible into your baby's mouth.   Pull the baby in close so the tip of the nose and the baby's cheeks touch the breast during the feeding.  FEEDINGS  The length of each feeding varies from baby to baby and from feeding to feeding.   The baby must suck about 2 to 3 minutes for your milk to get to him or her. This is called a "let down." For this reason, allow the baby to feed on each breast as long as he or she  wants. Your baby will end the feeding when he or she has received the right balance of nutrients.   To break the suction, put your finger into the corner of the baby's mouth and slide it between his or her gums before removing your breast from his or her mouth. This will help prevent sore nipples.  REDUCING BREAST ENGORGEMENT  In the first week after your baby is born, you may experience signs of breast engorgement. When breasts are engorged, they feel heavy, warm, full, and may be tender to the touch. You can reduce engorgement if you:   Nurse frequently, every 2 to 3 hours. Mothers who breastfeed early and often have fewer problems with engorgement.   Place light ice packs on your breasts between feedings. This reduces swelling. Wrap the ice packs in a lightweight towel to protect your skin.   Apply moist hot packs to your breast for 5 to 10 minutes before each feeding. This increases circulation and helps the milk flow.   Gently massage your breast before and during the feeding.   Make sure that the baby empties at least one breast at every feeding before switching sides.   Use a breast pump to empty the breasts if your baby is sleepy or not nursing well. You may also want to pump if you are returning to work or or you feel you are getting engorged.   Avoid bottle feeds,  pacifiers or supplemental feedings of water or juice in place of breastfeeding.   Be sure the baby is latched on and positioned properly while breastfeeding.   Prevent fatigue, stress, and anemia.   Wear a supportive bra, avoiding underwire styles.   Eat a balanced diet with enough fluids.  If you follow these suggestions, your engorgement should improve in 24 to 48 hours. If you are still experiencing difficulty, call your lactation consultant or caregiver. IS MY BABY GETTING ENOUGH MILK? Sometimes, mothers worry about whether their babies are getting enough milk. You can be assured that your baby is getting enough  milk if:  The baby is actively sucking and you hear swallowing.   The baby nurses at least 8 to 12 times in a 24 hour time period. Nurse your baby until he or she unlatches or falls asleep at the first breast (at least 10 to 20 minutes), then offer the second side.   The baby is wetting 5 to 6 disposable diapers (6 to 8 cloth diapers) in a 24 hour period by 64 to 52 days of age.   The baby is having at least 2 to 3 stools every 24 hours for the first few months. Breast milk is all the food your baby needs. It is not necessary for your baby to have water or formula. In fact, to help your breasts make more milk, it is best not to give your baby supplemental feedings during the early weeks.   The stool should be soft and yellow.   The baby should gain 4 to 7 ounces per week after he is 81 days old.  TAKE CARE OF YOURSELF Take care of your breasts by:  Bathing or showering daily.   Avoiding the use of soaps on your nipples.   Start feedings on your left breast at one feeding and on your right breast at the next feeding.   You will notice an increase in your milk supply 2 to 5 days after delivery. You may feel some discomfort from engorgement, which makes your breasts very firm and often tender. Engorgement "peaks" out within 24 to 48 hours. In the meantime, apply warm moist towels to your breasts for 5 to 10 minutes before feeding. Gentle massage and expression of some milk before feeding will soften your breasts, making it easier for your baby to latch on. Wear a well fitting nursing bra and air dry your nipples for 10 to 15 minutes after each feeding.   Only use cotton bra pads.   Only use pure lanolin on your nipples after nursing. You do not need to wash it off before nursing.  Take care of yourself by:   Eating well-balanced meals and nutritious snacks.   Drinking milk, fruit juice, and water to satisfy your thirst (about 8 glasses a day).   Getting plenty of rest.   Increasing  calcium in your diet (1200 mg a day).   Avoiding foods that you notice affect the baby in a bad way.  SEEK MEDICAL CARE IF:   You have any questions or difficulty with breastfeeding.   You need help.   You have a hard, red, sore area on your breast, accompanied by a fever of 100.5 F (38.1 C) or more.   Your baby is too sleepy to eat well or is having trouble sleeping.   Your baby is wetting less than 6 diapers per day, by 3 days of age.   Your baby's skin or white part of  his or her eyes is more yellow than it was in the hospital.   You feel depressed.  Document Released: 06/03/2005 Document Revised: 02/13/2011 Document Reviewed: 01/16/2009 [facility] Patient Information 2012 [place], [place].

## 2011-06-19 NOTE — Progress Notes (Signed)
Sneezing, new dog at home, trial of nasal steroid.  28 wk labs today.

## 2011-06-20 ENCOUNTER — Encounter: Payer: Self-pay | Admitting: Family Medicine

## 2011-06-20 LAB — HIV ANTIBODY (ROUTINE TESTING W REFLEX): HIV: NONREACTIVE

## 2011-06-27 ENCOUNTER — Ambulatory Visit: Payer: Medicaid Other | Admitting: Family Medicine

## 2011-07-03 ENCOUNTER — Encounter: Payer: Medicaid Other | Admitting: Obstetrics & Gynecology

## 2011-07-11 ENCOUNTER — Ambulatory Visit (INDEPENDENT_AMBULATORY_CARE_PROVIDER_SITE_OTHER): Payer: Medicaid Other | Admitting: Obstetrics & Gynecology

## 2011-07-11 ENCOUNTER — Encounter: Payer: Self-pay | Admitting: Obstetrics & Gynecology

## 2011-07-11 VITALS — BP 135/73 | Wt 167.0 lb

## 2011-07-11 DIAGNOSIS — Z3400 Encounter for supervision of normal first pregnancy, unspecified trimester: Secondary | ICD-10-CM

## 2011-07-11 NOTE — Progress Notes (Signed)
Routine visit. No problems. Good FM, No CTXs or VB or ROM.

## 2011-07-30 ENCOUNTER — Ambulatory Visit (INDEPENDENT_AMBULATORY_CARE_PROVIDER_SITE_OTHER): Payer: Medicaid Other | Admitting: Family Medicine

## 2011-07-30 DIAGNOSIS — Z3480 Encounter for supervision of other normal pregnancy, unspecified trimester: Secondary | ICD-10-CM

## 2011-07-30 NOTE — Progress Notes (Signed)
Patient is here for routine prenatal check.  Feeling good, nausea is better.

## 2011-07-30 NOTE — Progress Notes (Signed)
Doing well. No complaints.

## 2011-07-30 NOTE — Patient Instructions (Addendum)
Normal Labor and Delivery Your caregiver must first be sure you are in labor. Signs of labor include:  You may pass what is called "the mucus plug" before labor begins. This is a small amount of blood stained mucus.   Regular uterine contractions.   The time between contractions get closer together.   The discomfort and pain gradually gets more intense.   Pains are mostly located in the back.   Pains get worse when walking.   The cervix (the opening of the uterus becomes thinner (begins to efface) and opens up (dilates).  Once you are in labor and admitted into the hospital or care center, your caregiver will do the following:  A complete physical examination.   Check your vital signs (blood pressure, pulse, temperature and the fetal heart rate).   Do a vaginal examination (using a sterile glove and lubricant) to determine:   The position (presentation) of the baby (head [vertex] or buttock first).   The level (station) of the baby's head in the birth canal.   The effacement and dilatation of the cervix.   You may have your pubic hair shaved and be given an enema depending on your caregiver and the circumstance.   An electronic monitor is usually placed on your abdomen. The monitor follows the length and intensity of the contractions, as well as the baby's heart rate.   Usually, your caregiver will insert an IV in your arm with a bottle of sugar water. This is done as a precaution so that medications can be given to you quickly during labor or delivery.  NORMAL LABOR AND DELIVERY IS DIVIDED UP INTO 3 STAGES: First Stage This is when regular contractions begin and the cervix begins to efface and dilate. This stage can last from 3 to 15 hours. The end of the first stage is when the cervix is 100% effaced and 10 centimeters dilated. Pain medications may be given by   Injection (morphine, demerol, etc.)   Regional anesthesia (spinal, caudal or epidural, anesthetics given in  different locations of the spine). Paracervical pain medication may be given, which is an injection of and anesthetic on each side of the cervix.  A pregnant woman may request to have "Natural Childbirth" which is not to have any medications or anesthesia during her labor and delivery. Second Stage This is when the baby comes down through the birth canal (vagina) and is born. This can take 1 to 4 hours. As the baby's head comes down through the birth canal, you may feel like you are going to have a bowel movement. You will get the urge to bear down and push until the baby is delivered. As the baby's head is being delivered, the caregiver will decide if an episiotomy (a cut in the perineum and vagina area) is needed to prevent tearing of the tissue in this area. The episiotomy is sewn up after the delivery of the baby and placenta. Sometimes a mask with nitrous oxide is given for the mother to breath during the delivery of the baby to help if there is too much pain. The end of Stage 2 is when the baby is fully delivered. Then when the umbilical cord stops pulsating it is clamped and cut. Third Stage The third stage begins after the baby is completely delivered and ends after the placenta (afterbirth) is delivered. This usually takes 5 to 30 minutes. After the placenta is delivered, a medication is given either by intravenous or injection to help contract   the uterus and prevent bleeding. The third stage is not painful and pain medication is usually not necessary. If an episiotomy was done, it is repaired at this time. After the delivery, the mother is watched and monitored closely for 1 to 2 hours to make sure there is no postpartum bleeding (hemorrhage). If there is a lot of bleeding, medication is given to contract the uterus and stop the bleeding. Document Released: 03/12/2008 Document Revised: 02/13/2011 Document Reviewed: 03/12/2008 [employer] Patient Information 2012 [employer]. Birth Control  Choices Birth control is the use of any practices, methods, or devices to prevent pregnancy from happening in a sexually active woman.  Below are some birth control choices to help avoid pregnancy.  Not having sex (abstinence) is the surest form of birth control. This requires self-control. There is no risk of acquiring a sexually transmitted disease (STD), including acquired immunodeficiency syndrome (AIDS).   Periodic abstinence requires self-control during certain times of the month.   Calendar method, timing your menstrual periods from month to month.   Ovulation method is avoiding sexual intercourse around the time you produce an egg (ovulate).   Symptotherm method is avoiding sexual intercourse at the time of ovulation, using a thermometer and ovulation symptoms.   Post ovulation method is the timing of sexual intercourse after you ovulated.  These methods do not protect against STDs, including AIDS.  Birth control pills (BCPs) contain estrogen and progesterone hormone. These medicines work by stopping the egg from forming in the ovary (ovulation). Birth control pills are prescribed by a caregiver who will ask you questions about the risks of taking BCPs. Birth control pills do not protect against STDs, including AIDS.   "Minipill" birth control pills have only the progesterone hormone. They are taken every day of each month and must be prescribed by your caregiver. They do not protect against STDs, including AIDS.   Emergency contraception is often call the "morning after" pill. This pill can be taken right after sex or up to five days after sex if you think your birth control failed, you failed to use contraception, or you were forced to have sex. It is most effective the sooner you take the pills after having sexual intercourse. Do not use emergency contraception as your only form of birth control. Emergency contraceptive pills are available without a prescription. Check with your  pharmacist.   Condoms are a thin sheath of latex, synthetic material, or lambskin worn over the penis during sexual intercourse. They can have a spermicide in or on them when you buy them. Latex condoms can prevent pregnancy and STDs. "Natural" or lambskin condoms can prevent pregnancy but may not protect against STDs, including AIDS.   Female condoms are a soft, loose-fitting sheath that is put into the vagina before sexual intercourse. They can prevent pregnancy and STDs, including AIDS.   Sponge is a soft, circular piece of polyurethane foam with spermicide in it that is inserted into the vagina after wetting it and before sexual intercourse. It does not require a prescription from your caregiver. It does not protect against STDs, including AIDS.   Diaphragm is a soft, latex, dome-shaped barrier that must be fitted by a caregiver. It is inserted into the vagina, along with a spermicidal jelly. After the proper fitting for a diaphragm, always insert the diaphragm before intercourse. The diaphragm should be left in the vagina for 6 to 8 hours after intercourse. Removal and reinsertion with a spermicide is always necessary after any use. It   does not protect against STDs, including AIDS.   Progesterone-only injections are given every 3 months to prevent pregnancy. These injections contain synthetic progesterone and no estrogen. This hormone stops the ovaries from releasing eggs. It also causes the cervical mucus to thicken and changes the uterine lining. This makes it harder for sperm to survive in the uterus. It does not protect against STDs, including AIDS.   Birth Control Patch contains hormones similar to those in birth control pills, so effectiveness, risks, and side effects are similar. It must be changed once a week and is prescribed by a caregiver. It is less effective in very overweight women. It does not protect against STDs, including AIDS.   Vaginal Ring contains hormones similar to those in  birth control pills. It is left in place for 3 weeks, removed for 1 week, and then a new one is put back into the vagina. It comes with a timer to put in your purse to help you remember when to take it out or put a new one in. A caregiver's examination and prescription is necessary, just like with birth control pills and the patch. It does not protect against STDs, including AIDS.   Estrogen plus progesterone injections are given every 28 to 30 days. They can be given in the upper arm, thigh, or buttocks. It does not protect against STDs, including AIDS.   Intrauterine device (IUD): copper T or progestin filled is a T-shaped device that is put in a woman's uterus during a menstrual period to prevent pregnancy. The copper T IUD can last 10 years, and the progestin IUD can last 5 years. The progestin IUD can also help control heavy menstrual periods. It does not protect against STDs, including AIDS. The copper T IUD can be used as emergency contraception if inserted within 5 days of having unprotected intercourse.   Cervical cap is a round, soft latex or plastic cup that fits over the cervix and must be fitted by a caregiver. You do not need to use a spermicide with it or remove and insert it every time you have sexual intercourse. It does not protect against STDs, including AIDS.   Spermicides are chemicals that kill or block sperm from entering the cervix and uterus. They come in the form of creams, jellies, suppositories, foam, or tablets, and they do not require a prescription. They are inserted into the vagina with an applicator before having sexual intercourse. This must be repeated every time you have sexual intercourse.   Withdrawal is using the method of the female withdrawing his penis from sexual intercourse before he has a climax and deposits his sperm. It does not protect against STDs, including AIDS.   Female tubal ligation is when the woman's fallopian tubes are surgically sealed or tied to  prevent the egg from traveling to the uterus. It does not protect against STDs, including AIDS.   Female sterilization is when the female has his tubes that carry sperm tied off (vasectomy) to stop sperm from entering the vagina during sexual intercourse. It does not protect against STDs, including AIDS.  Regardless of which method of birth control you choose, it is still important that you use some form of protection against STDs. Document Released: 06/03/2005 Document Revised: 07/06/2010 Document Reviewed: 04/20/2009 [employer] Patient Information 2012 [employer]. Breastfeeding BENEFITS OF BREASTFEEDING For the baby  The first milk (colostrum) helps the baby's digestive system function better.   There are antibodies from the mother in the milk that help   the baby fight off infections.   The baby has a lower incidence of asthma, allergies, and SIDS (sudden infant death syndrome).   The nutrients in breast milk are better than formulas for the baby and helps the baby's brain grow better.   Babies who breastfeed have less gas, colic, and constipation.  For the mother  Breastfeeding helps develop a very special bond between mother and baby.   It is more convenient, always available at the correct temperature and cheaper than formula feeding.   It burns calories in the mother and helps with losing weight that was gained during pregnancy.   It makes the uterus contract back down to normal size faster and slows bleeding following delivery.   Breastfeeding mothers have a lower risk of developing breast cancer.  NURSE FREQUENTLY  A healthy, full-term baby may breastfeed as often as every hour or space his or her feedings to every 3 hours.   How often to nurse will vary from baby to baby. Watch your baby for signs of hunger, not the clock.   Nurse as often as the baby requests, or when you feel the need to reduce the fullness of your breasts.   Awaken the baby if it has been 3 to 4 hours  since the last feeding.   Frequent feeding will help the mother make more milk and will prevent problems like sore nipples and engorgement of the breasts.  BABY'S POSITION AT THE BREAST  Whether lying down or sitting, be sure that the baby's tummy is facing your tummy.   Support the breast with 4 fingers underneath the breast and the thumb above. Make sure your fingers are well away from the nipple and baby's mouth.   Stroke the baby's lips and cheek closest to the breast gently with your finger or nipple.   When the baby's mouth is open wide enough, place all of your nipple and as much of the dark area around the nipple as possible into your baby's mouth.   Pull the baby in close so the tip of the nose and the baby's cheeks touch the breast during the feeding.  FEEDINGS  The length of each feeding varies from baby to baby and from feeding to feeding.   The baby must suck about 2 to 3 minutes for your milk to get to him or her. This is called a "let down." For this reason, allow the baby to feed on each breast as long as he or she wants. Your baby will end the feeding when he or she has received the right balance of nutrients.   To break the suction, put your finger into the corner of the baby's mouth and slide it between his or her gums before removing your breast from his or her mouth. This will help prevent sore nipples.  REDUCING BREAST ENGORGEMENT  In the first week after your baby is born, you may experience signs of breast engorgement. When breasts are engorged, they feel heavy, warm, full, and may be tender to the touch. You can reduce engorgement if you:   Nurse frequently, every 2 to 3 hours. Mothers who breastfeed early and often have fewer problems with engorgement.   Place light ice packs on your breasts between feedings. This reduces swelling. Wrap the ice packs in a lightweight towel to protect your skin.   Apply moist hot packs to your breast for 5 to 10 minutes before  each feeding. This increases circulation and helps the milk flow.     Gently massage your breast before and during the feeding.   Make sure that the baby empties at least one breast at every feeding before switching sides.   Use a breast pump to empty the breasts if your baby is sleepy or not nursing well. You may also want to pump if you are returning to work or or you feel you are getting engorged.   Avoid bottle feeds, pacifiers or supplemental feedings of water or juice in place of breastfeeding.   Be sure the baby is latched on and positioned properly while breastfeeding.   Prevent fatigue, stress, and anemia.   Wear a supportive bra, avoiding underwire styles.   Eat a balanced diet with enough fluids.  If you follow these suggestions, your engorgement should improve in 24 to 48 hours. If you are still experiencing difficulty, call your lactation consultant or caregiver. IS MY BABY GETTING ENOUGH MILK? Sometimes, mothers worry about whether their babies are getting enough milk. You can be assured that your baby is getting enough milk if:  The baby is actively sucking and you hear swallowing.   The baby nurses at least 8 to 12 times in a 24 hour time period. Nurse your baby until he or she unlatches or falls asleep at the first breast (at least 10 to 20 minutes), then offer the second side.   The baby is wetting 5 to 6 disposable diapers (6 to 8 cloth diapers) in a 24 hour period by 5 to 6 days of age.   The baby is having at least 2 to 3 stools every 24 hours for the first few months. Breast milk is all the food your baby needs. It is not necessary for your baby to have water or formula. In fact, to help your breasts make more milk, it is best not to give your baby supplemental feedings during the early weeks.   The stool should be soft and yellow.   The baby should gain 4 to 7 ounces per week after he is 4 days old.  TAKE CARE OF YOURSELF Take care of your breasts by:  Bathing  or showering daily.   Avoiding the use of soaps on your nipples.   Start feedings on your left breast at one feeding and on your right breast at the next feeding.   You will notice an increase in your milk supply 2 to 5 days after delivery. You may feel some discomfort from engorgement, which makes your breasts very firm and often tender. Engorgement "peaks" out within 24 to 48 hours. In the meantime, apply warm moist towels to your breasts for 5 to 10 minutes before feeding. Gentle massage and expression of some milk before feeding will soften your breasts, making it easier for your baby to latch on. Wear a well fitting nursing bra and air dry your nipples for 10 to 15 minutes after each feeding.   Only use cotton bra pads.   Only use pure lanolin on your nipples after nursing. You do not need to wash it off before nursing.  Take care of yourself by:   Eating well-balanced meals and nutritious snacks.   Drinking milk, fruit juice, and water to satisfy your thirst (about 8 glasses a day).   Getting plenty of rest.   Increasing calcium in your diet (1200 mg a day).   Avoiding foods that you notice affect the baby in a bad way.  SEEK MEDICAL CARE IF:   You have any questions or difficulty with   breastfeeding.   You need help.   You have a hard, red, sore area on your breast, accompanied by a fever of 100.5 F (38.1 C) or more.   Your baby is too sleepy to eat well or is having trouble sleeping.   Your baby is wetting less than 6 diapers per day, by 5 days of age.   Your baby's skin or white part of his or her eyes is more yellow than it was in the hospital.   You feel depressed.  Document Released: 06/03/2005 Document Revised: 02/13/2011 Document Reviewed: 01/16/2009 [employer] Patient Information 2012 [employer]. 

## 2011-08-08 ENCOUNTER — Inpatient Hospital Stay (HOSPITAL_COMMUNITY)
Admission: AD | Admit: 2011-08-08 | Discharge: 2011-08-08 | Disposition: A | Discharge status: Home / Self Care | Payer: Medicaid Other | Source: Ambulatory Visit | Attending: Obstetrics & Gynecology | Admitting: Obstetrics & Gynecology

## 2011-08-08 ENCOUNTER — Encounter (HOSPITAL_COMMUNITY): Payer: Self-pay | Admitting: *Deleted

## 2011-08-08 DIAGNOSIS — R109 Unspecified abdominal pain: Secondary | ICD-10-CM | POA: Insufficient documentation

## 2011-08-08 DIAGNOSIS — O26899 Other specified pregnancy related conditions, unspecified trimester: Secondary | ICD-10-CM

## 2011-08-08 DIAGNOSIS — O99891 Other specified diseases and conditions complicating pregnancy: Secondary | ICD-10-CM | POA: Insufficient documentation

## 2011-08-08 LAB — URINALYSIS, ROUTINE W REFLEX MICROSCOPIC
Leukocytes, UA: NEGATIVE
Nitrite: NEGATIVE
Specific Gravity, Urine: 1.025 (ref 1.005–1.030)
pH: 6 (ref 5.0–8.0)

## 2011-08-08 NOTE — ED Provider Notes (Signed)
History     Chief Complaint  Patient presents with  . Abdominal Pain   HPI Started having cramping and "tightening" of abdomen off/on for 4 days. Pelvic pressure started 2 days ago.  Good fetal movement. No bleeding. No gush or leaking of fluid.  Last intercourse 1 week ago.  Prenatal care at [facility]. Last seen on 07/30/11. No complications so far. Blood pressures normal. No high blood sugars.  OB History    Grav Para Term Preterm Abortions TAB SAB Ect Mult Living   2 0 0 0 1 0 1 0 0 0       Past Medical History  Diagnosis Date  . Back pain   . Obesity   . Depression     no meds since pregancny  . Abnormal Pap smear     repeat WNL  . PONV (postoperative nausea and vomiting)   Had colposcopy for abnormal pap during pregnancy.  Past Surgical History  Procedure Date  . Tubes in ears age 62  . Tonsillectomy     age 56    Family History  Problem Relation Age of Onset  . Depression Mother   . Diabetes Mother   . Hypertension Mother   . COPD Mother   . Depression Sister   . Diabetes Sister   . Kidney disease Sister   . Hypertension Sister   . Hypertension Father     History  Substance Use Topics  . Smoking status: Current Everyday Smoker -- 1.0 packs/day for 6 years    Types: Cigarettes  . Smokeless tobacco: Never Used  . Alcohol Use: No    Allergies:  Allergies  Allergen Reactions  . Keflex Itching and Nausea And Vomiting    Prescriptions prior to admission  Medication Sig Dispense Refill  . desloratadine (CLARINEX) 5 MG tablet Take 1 tablet (5 mg total) by mouth daily.  30 tablet  2  . fluticasone (FLONASE) 50 MCG/ACT nasal spray Place 2 sprays into the nose daily.  1 g  0  . Prenatal Vit-Fe Fumarate-FA (PRENATAL MULTIVITAMIN) TABS Take 1 tablet by mouth daily.        Review of Systems  Constitutional: Negative for fever and chills.  Eyes: Negative for blurred vision.  Respiratory: Negative for shortness of breath.   Cardiovascular: Negative for  chest pain.  Gastrointestinal: Negative for diarrhea and constipation.  Genitourinary: Negative for dysuria.  Musculoskeletal: Positive for back pain.  Skin: Negative for rash.  Neurological: Negative for dizziness and headaches.  Psychiatric/Behavioral: Negative for depression and suicidal ideas.   Physical Exam   Blood pressure 140/87, pulse 89, temperature 97 F (36.1 C), temperature source Oral, resp. rate 18, height 5\' 1"  (1.549 m), weight 78.019 kg (172 lb), last menstrual period 12/05/2010.  Physical Exam  Constitutional: She appears well-developed and well-nourished. No distress.  HENT:  Head: Normocephalic and atraumatic.  Eyes: Pupils are equal, round, and reactive to light.  Neck: Neck supple.  Cardiovascular: Normal rate and regular rhythm.   No murmur heard. Respiratory: Effort normal. She has no wheezes.  GI: Soft.       Gravid, toco in place  Musculoskeletal: Normal range of motion. She exhibits no edema.  Neurological:       Grossly normal  Skin: Skin is warm and dry.    MAU Course  Procedures Patient not having regular contractions on monitor. Irregular contractions noted. FHT reassuring. No dysuria noted. Urine sent to lab for UA No vaginal discharge, bleeding or fluid. Cervical  check fingertip and thick. Not in active labor.  Assessment and Plan  26 yo [id number] at [redacted]w[redacted]d presenting for 4 day history of cramp-like contractions - Not in active labor - UA pending. Will follow up result and call patient if she needs an antibiotic. - She will follow up with [facility] on Tuesday. Her urine result can be reviewed then as well. - Will discharge home in stable medical condition with labor precautions. Patient discussed with Sharen Counter CNM who agrees with plan.  Maureen Perez 08/08/2011, 10:50 PM

## 2011-08-08 NOTE — Discharge Instructions (Signed)
You are not in active labor. Your pain is most likely irritability of your uterus. If you have any loss of fluid, increased frequency or increased strength of your pain, or bleeding, please come to the admissions unit to be evaluated. Otherwise, keep your appointment with [facility] on Tuesday. Also, if your urine shows you have an infection, we will let you know. Or you can ask on Tuesday. Take care!  Abdominal Pain During Pregnancy Belly (abdominal) pain is common during pregnancy. Most of the time, it is not a serious problem. Other times, it can be a sign that something is wrong with the pregnancy. Always tell your doctor if you have belly pain. HOME CARE For mild pain:  Do not have sex (intercourse) or put anything in your vagina until you feel better.   Rest until your pain stops. If your pain lasts longer than 1 hour, call your doctor.   Drink clear fluids if you feel sick to your stomach (nauseous).   Do not eat solid food until you feel better.   Only take medicine as told by your doctor.   Keep all doctor visits as told.  GET HELP RIGHT AWAY IF:   You are bleeding, leaking fluid, or pieces of tissue come out of your vagina.   You have more pain or cramping.   You keep throwing up (vomiting).   You have pain when you pee (urinate) or have blood in your pee.   You have a fever.   You do not feel your baby moving as much.   You feel very weak or feel like passing out.   You have trouble breathing, with or without belly pain.   You have a very bad headache and belly pain.   You have fluid leaking from your vagina and belly pain.   You keep having watery poop (diarrhea).   Your belly pain does not go away after resting, or the pain gets worse.  MAKE SURE YOU:   Understand these instructions.   Will watch your condition.   Will get help right away if you are not doing well or get worse.  Document Released: 05/22/2009 Document Revised: 02/13/2011 Document  Reviewed: 12/28/2010 [facility] Patient Information 2012 [place], [place].

## 2011-08-08 NOTE — Progress Notes (Signed)
Patient states she has been cramping for the last four days. Her stomach tightens and loosens up off and on per patient.

## 2011-08-08 NOTE — ED Provider Notes (Signed)
Attestation of Attending Supervision of Resident: Evaluation and management procedures were performed by the [facility] Resident under my supervision.  I have reviewed the resident's note and chart, and I agree with management and plan.  Jaynie Collins, [name] 08/08/2011 11:24 PM

## 2011-08-13 ENCOUNTER — Ambulatory Visit (INDEPENDENT_AMBULATORY_CARE_PROVIDER_SITE_OTHER): Payer: Medicaid Other | Admitting: Obstetrics & Gynecology

## 2011-08-13 VITALS — BP 127/77 | Wt 175.0 lb

## 2011-08-13 DIAGNOSIS — Z3400 Encounter for supervision of normal first pregnancy, unspecified trimester: Secondary | ICD-10-CM

## 2011-08-13 NOTE — Progress Notes (Signed)
Seen in MAU on 08/08/11 for contractions, closed cervix on exam.  UA neg. GC, Chlam and GBS cultures done today; closed cervix on exam  Plans to breastfeed and bottle feed; desires MIrena IUD for PPBCM. No other complaints or concerns.  Fetal movement and labor precautions reviewed.

## 2011-08-13 NOTE — Progress Notes (Signed)
Patient here for routine prenatal.  She went to [facility] last week for pain and they told her to ask about her urine results today at her visit.  Urine test is negative from 08/09/2011.

## 2011-08-13 NOTE — Patient Instructions (Signed)
Breastfeeding BENEFITS OF BREASTFEEDING For the baby  The first milk (colostrum) helps the baby's digestive system function better.   There are antibodies from the mother in the milk that help the baby fight off infections.   The baby has a lower incidence of asthma, allergies, and SIDS (sudden infant death syndrome).   The nutrients in breast milk are better than formulas for the baby and helps the baby's brain grow better.   Babies who breastfeed have less gas, colic, and constipation.  For the mother  Breastfeeding helps develop a very special bond between mother and baby.   It is more convenient, always available at the correct temperature and cheaper than formula feeding.   It burns calories in the mother and helps with losing weight that was gained during pregnancy.   It makes the uterus contract back down to normal size faster and slows bleeding following delivery.   Breastfeeding mothers have a lower risk of developing breast cancer.  NURSE FREQUENTLY  A healthy, full-term baby may breastfeed as often as every hour or space his or her feedings to every 3 hours.   How often to nurse will vary from baby to baby. Watch your baby for signs of hunger, not the clock.   Nurse as often as the baby requests, or when you feel the need to reduce the fullness of your breasts.   Awaken the baby if it has been 3 to 4 hours since the last feeding.   Frequent feeding will help the mother make more milk and will prevent problems like sore nipples and engorgement of the breasts.  BABY'S POSITION AT THE BREAST  Whether lying down or sitting, be sure that the baby's tummy is facing your tummy.   Support the breast with 4 fingers underneath the breast and the thumb above. Make sure your fingers are well away from the nipple and baby's mouth.   Stroke the baby's lips and cheek closest to the breast gently with your finger or nipple.   When the baby's mouth is open wide enough, place  all of your nipple and as much of the dark area around the nipple as possible into your baby's mouth.   Pull the baby in close so the tip of the nose and the baby's cheeks touch the breast during the feeding.  FEEDINGS  The length of each feeding varies from baby to baby and from feeding to feeding.   The baby must suck about 2 to 3 minutes for your milk to get to him or her. This is called a "let down." For this reason, allow the baby to feed on each breast as long as he or she wants. Your baby will end the feeding when he or she has received the right balance of nutrients.   To break the suction, put your finger into the corner of the baby's mouth and slide it between his or her gums before removing your breast from his or her mouth. This will help prevent sore nipples.  REDUCING BREAST ENGORGEMENT  In the first week after your baby is born, you may experience signs of breast engorgement. When breasts are engorged, they feel heavy, warm, full, and may be tender to the touch. You can reduce engorgement if you:   Nurse frequently, every 2 to 3 hours. Mothers who breastfeed early and often have fewer problems with engorgement.   Place light ice packs on your breasts between feedings. This reduces swelling. Wrap the ice packs in a   lightweight towel to protect your skin.   Apply moist hot packs to your breast for 5 to 10 minutes before each feeding. This increases circulation and helps the milk flow.   Gently massage your breast before and during the feeding.   Make sure that the baby empties at least one breast at every feeding before switching sides.   Use a breast pump to empty the breasts if your baby is sleepy or not nursing well. You may also want to pump if you are returning to work or or you feel you are getting engorged.   Avoid bottle feeds, pacifiers or supplemental feedings of water or juice in place of breastfeeding.   Be sure the baby is latched on and positioned properly while  breastfeeding.   Prevent fatigue, stress, and anemia.   Wear a supportive bra, avoiding underwire styles.   Eat a balanced diet with enough fluids.  If you follow these suggestions, your engorgement should improve in 24 to 48 hours. If you are still experiencing difficulty, call your lactation consultant or caregiver. IS MY BABY GETTING ENOUGH MILK? Sometimes, mothers worry about whether their babies are getting enough milk. You can be assured that your baby is getting enough milk if:  The baby is actively sucking and you hear swallowing.   The baby nurses at least 8 to 12 times in a 24 hour time period. Nurse your baby until he or she unlatches or falls asleep at the first breast (at least 10 to 20 minutes), then offer the second side.   The baby is wetting 5 to 6 disposable diapers (6 to 8 cloth diapers) in a 24 hour period by 5 to 6 days of age.   The baby is having at least 2 to 3 stools every 24 hours for the first few months. Breast milk is all the food your baby needs. It is not necessary for your baby to have water or formula. In fact, to help your breasts make more milk, it is best not to give your baby supplemental feedings during the early weeks.   The stool should be soft and yellow.   The baby should gain 4 to 7 ounces per week after he is 4 days old.  TAKE CARE OF YOURSELF Take care of your breasts by:  Bathing or showering daily.   Avoiding the use of soaps on your nipples.   Start feedings on your left breast at one feeding and on your right breast at the next feeding.   You will notice an increase in your milk supply 2 to 5 days after delivery. You may feel some discomfort from engorgement, which makes your breasts very firm and often tender. Engorgement "peaks" out within 24 to 48 hours. In the meantime, apply warm moist towels to your breasts for 5 to 10 minutes before feeding. Gentle massage and expression of some milk before feeding will soften your breasts, making  it easier for your baby to latch on. Wear a well fitting nursing bra and air dry your nipples for 10 to 15 minutes after each feeding.   Only use cotton bra pads.   Only use pure lanolin on your nipples after nursing. You do not need to wash it off before nursing.  Take care of yourself by:   Eating well-balanced meals and nutritious snacks.   Drinking milk, fruit juice, and water to satisfy your thirst (about 8 glasses a day).   Getting plenty of rest.   Increasing calcium in   your diet (1200 mg a day).   Avoiding foods that you notice affect the baby in a bad way.  SEEK MEDICAL CARE IF:   You have any questions or difficulty with breastfeeding.   You need help.   You have a hard, red, sore area on your breast, accompanied by a fever of 100.5 F (38.1 C) or more.   Your baby is too sleepy to eat well or is having trouble sleeping.   Your baby is wetting less than 6 diapers per day, by 5 days of age.   Your baby's skin or white part of his or her eyes is more yellow than it was in the hospital.   You feel depressed.  Document Released: 06/03/2005 Document Revised: 02/13/2011 Document Reviewed: 01/16/2009 [employer] Patient Information 2012 [employer]. 

## 2011-08-14 LAB — GC/CHLAMYDIA PROBE AMP, GENITAL
Chlamydia, DNA Probe: NEGATIVE
GC Probe Amp, Genital: NEGATIVE

## 2011-08-16 LAB — CULTURE, BETA STREP (GROUP B ONLY)

## 2011-08-17 ENCOUNTER — Inpatient Hospital Stay (HOSPITAL_COMMUNITY)
Admission: AD | Admit: 2011-08-17 | Discharge: 2011-08-17 | Disposition: A | Discharge status: Home Health | Payer: Medicaid Other | Source: Ambulatory Visit | Attending: Obstetrics and Gynecology | Admitting: Obstetrics and Gynecology

## 2011-08-17 ENCOUNTER — Encounter (HOSPITAL_COMMUNITY): Payer: Self-pay | Admitting: *Deleted

## 2011-08-17 DIAGNOSIS — R109 Unspecified abdominal pain: Secondary | ICD-10-CM | POA: Insufficient documentation

## 2011-08-17 DIAGNOSIS — N949 Unspecified condition associated with female genital organs and menstrual cycle: Secondary | ICD-10-CM

## 2011-08-17 DIAGNOSIS — O479 False labor, unspecified: Secondary | ICD-10-CM

## 2011-08-17 DIAGNOSIS — N898 Other specified noninflammatory disorders of vagina: Secondary | ICD-10-CM

## 2011-08-17 LAB — AMNISURE RUPTURE OF MEMBRANE (ROM) NOT AT ARMC: Amnisure ROM: NEGATIVE

## 2011-08-17 LAB — URINALYSIS, ROUTINE W REFLEX MICROSCOPIC
Bilirubin Urine: NEGATIVE
Ketones, ur: NEGATIVE mg/dL
Nitrite: NEGATIVE
Protein, ur: NEGATIVE mg/dL
Urobilinogen, UA: 0.2 mg/dL (ref 0.0–1.0)

## 2011-08-17 LAB — URINE MICROSCOPIC-ADD ON

## 2011-08-17 NOTE — Progress Notes (Signed)
Watery discharge x 3 days started having lower abdominal pain, 36 weeks G1 unsure if water has broken.

## 2011-08-17 NOTE — ED Provider Notes (Signed)
History     Chief Complaint  Patient presents with  . Vaginal Discharge  . Dysuria  . Abdominal Pain    constant   HPI 26 yo G2P0010 at 36 weeks and 3 days presents with sharp, non-radiating, bilateral abdominal pain worse with standing and walking and improved with laying down.  Denies nausea, vomiting, diarrhea, fevers, chills.  No contractions, decreased fetal activity, vaginal bleeding, vaginal discharge.  Also complains of thin, watery vaginal discharge that started a couple hours ago.  None now.  OB History    Grav Para Term Preterm Abortions TAB SAB Ect Mult Living   2 0 0 0 1 0 1 0 0 0       Past Medical History  Diagnosis Date  . Back pain   . Obesity   . Depression     no meds since pregancny  . Abnormal Pap smear     repeat WNL  . PONV (postoperative nausea and vomiting)     Past Surgical History  Procedure Date  . Tubes in ears age 49  . Tonsillectomy     age 11    Family History  Problem Relation Age of Onset  . Depression Mother   . Diabetes Mother   . Hypertension Mother   . COPD Mother   . Depression Sister   . Diabetes Sister   . Kidney disease Sister   . Hypertension Sister   . Hypertension Father     History  Substance Use Topics  . Smoking status: Current Everyday Smoker -- 0.5 packs/day for 6 years    Types: Cigarettes  . Smokeless tobacco: Never Used  . Alcohol Use: No    Allergies:  Allergies  Allergen Reactions  . Keflex Itching and Nausea And Vomiting    Prescriptions prior to admission  Medication Sig Dispense Refill  . desloratadine (CLARINEX) 5 MG tablet Take 1 tablet (5 mg total) by mouth daily.  30 tablet  2  . Prenatal Vit-Fe Fumarate-FA (PRENATAL MULTIVITAMIN) TABS Take 1 tablet by mouth daily.        Review of Systems  All other systems reviewed and are negative.   Physical Exam   Blood pressure 123/78, pulse 91, temperature 97.6 F (36.4 C), temperature source Oral, resp. rate 16, height 5\' 1"  (1.549 m),  weight 80.196 kg (176 lb 12.8 oz), last menstrual period 12/05/2010, SpO2 100.00%.  Physical Exam  Constitutional: She is oriented to person, place, and time. She appears well-developed and well-nourished.  HENT:  Head: Normocephalic.  Cardiovascular: Normal rate.   Respiratory: Effort normal.  GI: Soft. Bowel sounds are normal. She exhibits no distension and no mass. There is tenderness (bilateral in area of round ligament). There is no rebound and no guarding.  Genitourinary:       Scant normal whitish discharge.  No pooling.  Musculoskeletal: Normal range of motion.  Neurological: She is alert and oriented to person, place, and time.  Skin: Skin is warm.  Psychiatric: She has a normal mood and affect. Her behavior is normal. Judgment and thought content normal.   Amnisure negative. NST: Category 1 tracing with baseline 140.  Accelerations seen.  No contractions.  MAU Course  Procedures   Assessment and Plan  1.  Round ligament pain 2.  Normal vaginal discharge.  Ruled out SROM.  Patient not in labor.  Hand out for round ligament pain given.  Patient to follow up in clinic on Wednesday.  Dazja Houchin JEHIEL 08/17/2011, 5:34 PM

## 2011-08-20 ENCOUNTER — Ambulatory Visit (INDEPENDENT_AMBULATORY_CARE_PROVIDER_SITE_OTHER): Payer: Medicaid Other | Admitting: Obstetrics and Gynecology

## 2011-08-20 DIAGNOSIS — Z3400 Encounter for supervision of normal first pregnancy, unspecified trimester: Secondary | ICD-10-CM

## 2011-08-20 NOTE — Progress Notes (Signed)
Patient doing well without complaints. FM/labor precautions reviewed 

## 2011-08-25 ENCOUNTER — Inpatient Hospital Stay (HOSPITAL_COMMUNITY)
Admission: AD | Admit: 2011-08-25 | Discharge: 2011-08-26 | Disposition: A | Discharge status: Home Health | Payer: Medicaid Other | Source: Ambulatory Visit | Attending: Obstetrics and Gynecology | Admitting: Obstetrics and Gynecology

## 2011-08-25 ENCOUNTER — Encounter (HOSPITAL_COMMUNITY): Payer: Self-pay | Admitting: *Deleted

## 2011-08-25 DIAGNOSIS — M549 Dorsalgia, unspecified: Secondary | ICD-10-CM

## 2011-08-25 DIAGNOSIS — M545 Low back pain, unspecified: Secondary | ICD-10-CM | POA: Insufficient documentation

## 2011-08-25 DIAGNOSIS — O26899 Other specified pregnancy related conditions, unspecified trimester: Secondary | ICD-10-CM

## 2011-08-25 DIAGNOSIS — O99891 Other specified diseases and conditions complicating pregnancy: Secondary | ICD-10-CM | POA: Insufficient documentation

## 2011-08-25 LAB — URINALYSIS, ROUTINE W REFLEX MICROSCOPIC
Bilirubin Urine: NEGATIVE
Glucose, UA: NEGATIVE mg/dL
Hgb urine dipstick: NEGATIVE
Ketones, ur: NEGATIVE mg/dL
Leukocytes, UA: NEGATIVE
Nitrite: NEGATIVE
Protein, ur: NEGATIVE mg/dL
Specific Gravity, Urine: >1.03 — ABNORMAL HIGH (ref 1.005–1.030)
Urobilinogen, UA: 0.2 mg/dL (ref 0.0–1.0)
pH: 6 (ref 5.0–8.0)

## 2011-08-25 NOTE — Progress Notes (Signed)
Pt G1 at 37.4wks having lower back pain that is constant at night, comes and goes during the day.  Pt denies any problems with pregnancy or bleeding.

## 2011-08-25 NOTE — ED Provider Notes (Signed)
History     Chief Complaint  Patient presents with  . Back Pain   HPI  Pt G1 at 37.4wks having lower back pain that is constant at night, comes and goes during the day.  Pain started three days ago.  Pt denies any problems with pregnancy or bleeding.  +Fetal movement.   Past Medical History  Diagnosis Date  . Back pain   . Obesity   . Depression     no meds since pregancny  . Abnormal Pap smear     repeat WNL  . PONV (postoperative nausea and vomiting)     Past Surgical History  Procedure Date  . Tubes in ears age 60  . Tonsillectomy     age 14    Family History  Problem Relation Age of Onset  . Depression Mother   . Diabetes Mother   . Hypertension Mother   . COPD Mother   . Anesthesia problems Mother   . Depression Sister   . Diabetes Sister   . Kidney disease Sister   . Hypertension Sister   . Hypertension Father   . Anesthesia problems Father     History  Substance Use Topics  . Smoking status: Current Everyday Smoker -- 0.5 packs/day for 6 years    Types: Cigarettes  . Smokeless tobacco: Never Used  . Alcohol Use: No    Allergies:  Allergies  Allergen Reactions  . Keflex Itching and Nausea And Vomiting    Prescriptions prior to admission  Medication Sig Dispense Refill  . acetaminophen (TYLENOL) 500 MG tablet Take 500 mg by mouth every 6 (six) hours as needed. For pain      . desloratadine (CLARINEX) 5 MG tablet Take 1 tablet (5 mg total) by mouth daily.  30 tablet  2  . Prenatal Vit-Fe Fumarate-FA (PRENATAL MULTIVITAMIN) TABS Take 1 tablet by mouth every morning.         Review of Systems  Gastrointestinal: Positive for abdominal pain.  Musculoskeletal: Positive for back pain.  All other systems reviewed and are negative.   Physical Exam   Blood pressure 120/81, pulse 90, temperature 97.1 F (36.2 C), temperature source Oral, resp. rate 18, height 5\' 1"  (1.549 m), weight 80.468 kg (177 lb 6.4 oz), last menstrual period  12/05/2010.  Physical Exam  Constitutional: She is oriented to person, place, and time. She appears well-developed and well-nourished. No distress (smiling during conversation).  HENT:  Head: Normocephalic.  Neck: Normal range of motion. Neck supple.  Cardiovascular: Normal rate, regular rhythm and normal heart sounds.   Respiratory: Effort normal and breath sounds normal.  GI: Soft. There is no tenderness. There is no CVA tenderness.  Genitourinary: No bleeding around the vagina. Vaginal discharge (mucusy) found.       1/25/-2  Neurological: She is alert and oriented to person, place, and time.  Skin: Skin is warm and dry.  FHR 120's, +accels, reactive Toco - none  MAU Course  Procedures  Results for orders placed during the hospital encounter of 08/25/11 (from the past 24 hour(s))  URINALYSIS, ROUTINE W REFLEX MICROSCOPIC     Status: Abnormal   Collection Time   08/25/11 11:10 PM      Component Value Range   Color, Urine YELLOW  YELLOW    APPearance HAZY (*) CLEAR    Specific Gravity, Urine >1.030 (*) 1.005 - 1.030    pH 6.0  5.0 - 8.0    Glucose, UA NEGATIVE  NEGATIVE (mg/dL)  Hgb urine dipstick NEGATIVE  NEGATIVE    Bilirubin Urine NEGATIVE  NEGATIVE    Ketones, ur NEGATIVE  NEGATIVE (mg/dL)   Protein, ur NEGATIVE  NEGATIVE (mg/dL)   Urobilinogen, UA 0.2  0.0 - 1.0 (mg/dL)   Nitrite NEGATIVE  NEGATIVE    Leukocytes, UA NEGATIVE  NEGATIVE      Assessment and Plan  Back Pain in Pregnancy  Plan: DC to home Labor Precautions Tylenol for pain  [facility] 08/25/2011, 11:03 PM

## 2011-08-25 NOTE — Discharge Instructions (Signed)

## 2011-08-27 ENCOUNTER — Ambulatory Visit (INDEPENDENT_AMBULATORY_CARE_PROVIDER_SITE_OTHER): Payer: Medicaid Other | Admitting: Family Medicine

## 2011-08-27 DIAGNOSIS — Z3480 Encounter for supervision of other normal pregnancy, unspecified trimester: Secondary | ICD-10-CM

## 2011-08-27 NOTE — Patient Instructions (Addendum)
Normal Labor and Delivery Your caregiver must first be sure you are in labor. Signs of labor include:  You may pass what is called "the mucus plug" before labor begins. This is a small amount of blood stained mucus.   Regular uterine contractions.   The time between contractions get closer together.   The discomfort and pain gradually gets more intense.   Pains are mostly located in the back.   Pains get worse when walking.   The cervix (the opening of the uterus becomes thinner (begins to efface) and opens up (dilates).  Once you are in labor and admitted into the hospital or care center, your caregiver will do the following:  A complete physical examination.   Check your vital signs (blood pressure, pulse, temperature and the fetal heart rate).   Do a vaginal examination (using a sterile glove and lubricant) to determine:   The position (presentation) of the baby (head [vertex] or buttock first).   The level (station) of the baby's head in the birth canal.   The effacement and dilatation of the cervix.   You may have your pubic hair shaved and be given an enema depending on your caregiver and the circumstance.   An electronic monitor is usually placed on your abdomen. The monitor follows the length and intensity of the contractions, as well as the baby's heart rate.   Usually, your caregiver will insert an IV in your arm with a bottle of sugar water. This is done as a precaution so that medications can be given to you quickly during labor or delivery.  NORMAL LABOR AND DELIVERY IS DIVIDED UP INTO 3 STAGES: First Stage This is when regular contractions begin and the cervix begins to efface and dilate. This stage can last from 3 to 15 hours. The end of the first stage is when the cervix is 100% effaced and 10 centimeters dilated. Pain medications may be given by   Injection (morphine, demerol, etc.)   Regional anesthesia (spinal, caudal or epidural, anesthetics given in  different locations of the spine). Paracervical pain medication may be given, which is an injection of and anesthetic on each side of the cervix.  A pregnant woman may request to have "Natural Childbirth" which is not to have any medications or anesthesia during her labor and delivery. Second Stage This is when the baby comes down through the birth canal (vagina) and is born. This can take 1 to 4 hours. As the baby's head comes down through the birth canal, you may feel like you are going to have a bowel movement. You will get the urge to bear down and push until the baby is delivered. As the baby's head is being delivered, the caregiver will decide if an episiotomy (a cut in the perineum and vagina area) is needed to prevent tearing of the tissue in this area. The episiotomy is sewn up after the delivery of the baby and placenta. Sometimes a mask with nitrous oxide is given for the mother to breath during the delivery of the baby to help if there is too much pain. The end of Stage 2 is when the baby is fully delivered. Then when the umbilical cord stops pulsating it is clamped and cut. Third Stage The third stage begins after the baby is completely delivered and ends after the placenta (afterbirth) is delivered. This usually takes 5 to 30 minutes. After the placenta is delivered, a medication is given either by intravenous or injection to help contract   the uterus and prevent bleeding. The third stage is not painful and pain medication is usually not necessary. If an episiotomy was done, it is repaired at this time. After the delivery, the mother is watched and monitored closely for 1 to 2 hours to make sure there is no postpartum bleeding (hemorrhage). If there is a lot of bleeding, medication is given to contract the uterus and stop the bleeding. Document Released: 03/12/2008 Document Revised: 05/23/2011 Document Reviewed: 03/12/2008 [employer] Patient Information 2012 [employer]. Contraception  Choices Contraception (birth control) is the use of any methods or devices to prevent pregnancy. Below are some methods to help avoid pregnancy. HORMONAL METHODS   Contraceptive implant. This is a thin, plastic tube containing progesterone hormone. It does not contain estrogen hormone. Your caregiver inserts the tube in the inner part of the upper arm. The tube can remain in place for up to 3 years. After 3 years, the implant must be removed. The implant prevents the ovaries from releasing an egg (ovulation), thickens the cervical mucus which prevents sperm from entering the uterus, and thins the lining of the inside of the uterus.   Progesterone-only injections. These injections are given every 3 months by your caregiver to prevent pregnancy. This synthetic progesterone hormone stops the ovaries from releasing eggs. It also thickens cervical mucus and changes the uterine lining. This makes it harder for sperm to survive in the uterus.   Birth control pills. These pills contain estrogen and progesterone hormone. They work by stopping the egg from forming in the ovary (ovulation). Birth control pills are prescribed by a caregiver.Birth control pills can also be used to treat heavy periods.   Minipill. This type of birth control pill contains only the progesterone hormone. They are taken every day of each month and must be prescribed by your caregiver.   Birth control patch. The patch contains hormones similar to those in birth control pills. It must be changed once a week and is prescribed by a caregiver.   Vaginal ring. The ring contains hormones similar to those in birth control pills. It is left in the vagina for 3 weeks, removed for 1 week, and then a new one is put back in place. The patient must be comfortable inserting and removing the ring from the vagina.A caregiver's prescription is necessary.   Emergency contraception. Emergency contraceptives prevent pregnancy after unprotected sexual  intercourse. This pill can be taken right after sex or up to 5 days after unprotected sex. It is most effective the sooner you take the pills after having sexual intercourse. Emergency contraceptive pills are available without a prescription. Check with your pharmacist. Do not use emergency contraception as your only form of birth control.  BARRIER METHODS   Female condom. This is a thin sheath (latex or rubber) that is worn over the penis during sexual intercourse. It can be used with spermicide to increase effectiveness.   Female condom. This is a soft, loose-fitting sheath that is put into the vagina before sexual intercourse.   Diaphragm. This is a soft, latex, dome-shaped barrier that must be fitted by a caregiver. It is inserted into the vagina, along with a spermicidal jelly. It is inserted before intercourse. The diaphragm should be left in the vagina for 6 to 8 hours after intercourse.   Cervical cap. This is a round, soft, latex or plastic cup that fits over the cervix and must be fitted by a caregiver. The cap can be left in place for up to   48 hours after intercourse.   Sponge. This is a soft, circular piece of polyurethane foam. The sponge has spermicide in it. It is inserted into the vagina after wetting it and before sexual intercourse.   Spermicides. These are chemicals that kill or block sperm from entering the cervix and uterus. They come in the form of creams, jellies, suppositories, foam, or tablets. They do not require a prescription. They are inserted into the vagina with an applicator before having sexual intercourse. The process must be repeated every time you have sexual intercourse.  INTRAUTERINE CONTRACEPTION  Intrauterine device (IUD). This is a T-shaped device that is put in a woman's uterus during a menstrual period to prevent pregnancy. There are 2 types:   Copper IUD. This type of IUD is wrapped in copper wire and is placed inside the uterus. Copper makes the uterus and  fallopian tubes produce a fluid that kills sperm. It can stay in place for 10 years.   Hormone IUD. This type of IUD contains the hormone progestin (synthetic progesterone). The hormone thickens the cervical mucus and prevents sperm from entering the uterus, and it also thins the uterine lining to prevent implantation of a fertilized egg. The hormone can weaken or kill the sperm that get into the uterus. It can stay in place for 5 years.  PERMANENT METHODS OF CONTRACEPTION  Female tubal ligation. This is when the woman's fallopian tubes are surgically sealed, tied, or blocked to prevent the egg from traveling to the uterus.   Female sterilization. This is when the female has the tubes that carry sperm tied off (vasectomy).This blocks sperm from entering the vagina during sexual intercourse. After the procedure, the man can still ejaculate fluid (semen).  NATURAL PLANNING METHODS  Natural family planning. This is not having sexual intercourse or using a barrier method (condom, diaphragm, cervical cap) on days the woman could become pregnant.   Calendar method. This is keeping track of the length of each menstrual cycle and identifying when you are fertile.   Ovulation method. This is avoiding sexual intercourse during ovulation.   Symptothermal method. This is avoiding sexual intercourse during ovulation, using a thermometer and ovulation symptoms.   Post-ovulation method. This is timing sexual intercourse after you have ovulated.  Regardless of which type or method of contraception you choose, it is important that you use condoms to protect against the transmission of sexually transmitted diseases (STDs). Talk with your caregiver about which form of contraception is most appropriate for you. Document Released: 06/03/2005 Document Revised: 05/23/2011 Document Reviewed: 10/10/2010 [employer] Patient Information 2012 [employer]. Breastfeeding BENEFITS OF BREASTFEEDING For the baby  The first  milk (colostrum) helps the baby's digestive system function better.   There are antibodies from the mother in the milk that help the baby fight off infections.   The baby has a lower incidence of asthma, allergies, and SIDS (sudden infant death syndrome).   The nutrients in breast milk are better than formulas for the baby and helps the baby's brain grow better.   Babies who breastfeed have less gas, colic, and constipation.  For the mother  Breastfeeding helps develop a very special bond between mother and baby.   It is more convenient, always available at the correct temperature and cheaper than formula feeding.   It burns calories in the mother and helps with losing weight that was gained during pregnancy.   It makes the uterus contract back down to normal size faster and slows bleeding following delivery.     Breastfeeding mothers have a lower risk of developing breast cancer.  NURSE FREQUENTLY  A healthy, full-term baby may breastfeed as often as every hour or space his or her feedings to every 3 hours.   How often to nurse will vary from baby to baby. Watch your baby for signs of hunger, not the clock.   Nurse as often as the baby requests, or when you feel the need to reduce the fullness of your breasts.   Awaken the baby if it has been 3 to 4 hours since the last feeding.   Frequent feeding will help the mother make more milk and will prevent problems like sore nipples and engorgement of the breasts.  BABY'S POSITION AT THE BREAST  Whether lying down or sitting, be sure that the baby's tummy is facing your tummy.   Support the breast with 4 fingers underneath the breast and the thumb above. Make sure your fingers are well away from the nipple and baby's mouth.   Stroke the baby's lips and cheek closest to the breast gently with your finger or nipple.   When the baby's mouth is open wide enough, place all of your nipple and as much of the dark area around the nipple as  possible into your baby's mouth.   Pull the baby in close so the tip of the nose and the baby's cheeks touch the breast during the feeding.  FEEDINGS  The length of each feeding varies from baby to baby and from feeding to feeding.   The baby must suck about 2 to 3 minutes for your milk to get to him or her. This is called a "let down." For this reason, allow the baby to feed on each breast as long as he or she wants. Your baby will end the feeding when he or she has received the right balance of nutrients.   To break the suction, put your finger into the corner of the baby's mouth and slide it between his or her gums before removing your breast from his or her mouth. This will help prevent sore nipples.  REDUCING BREAST ENGORGEMENT  In the first week after your baby is born, you may experience signs of breast engorgement. When breasts are engorged, they feel heavy, warm, full, and may be tender to the touch. You can reduce engorgement if you:   Nurse frequently, every 2 to 3 hours. Mothers who breastfeed early and often have fewer problems with engorgement.   Place light ice packs on your breasts between feedings. This reduces swelling. Wrap the ice packs in a lightweight towel to protect your skin.   Apply moist hot packs to your breast for 5 to 10 minutes before each feeding. This increases circulation and helps the milk flow.   Gently massage your breast before and during the feeding.   Make sure that the baby empties at least one breast at every feeding before switching sides.   Use a breast pump to empty the breasts if your baby is sleepy or not nursing well. You may also want to pump if you are returning to work or or you feel you are getting engorged.   Avoid bottle feeds, pacifiers or supplemental feedings of water or juice in place of breastfeeding.   Be sure the baby is latched on and positioned properly while breastfeeding.   Prevent fatigue, stress, and anemia.   Wear a  supportive bra, avoiding underwire styles.   Eat a balanced diet with enough fluids.    If you follow these suggestions, your engorgement should improve in 24 to 48 hours. If you are still experiencing difficulty, call your lactation consultant or caregiver. IS MY BABY GETTING ENOUGH MILK? Sometimes, mothers worry about whether their babies are getting enough milk. You can be assured that your baby is getting enough milk if:  The baby is actively sucking and you hear swallowing.   The baby nurses at least 8 to 12 times in a 24 hour time period. Nurse your baby until he or she unlatches or falls asleep at the first breast (at least 10 to 20 minutes), then offer the second side.   The baby is wetting 5 to 6 disposable diapers (6 to 8 cloth diapers) in a 24 hour period by 5 to 6 days of age.   The baby is having at least 2 to 3 stools every 24 hours for the first few months. Breast milk is all the food your baby needs. It is not necessary for your baby to have water or formula. In fact, to help your breasts make more milk, it is best not to give your baby supplemental feedings during the early weeks.   The stool should be soft and yellow.   The baby should gain 4 to 7 ounces per week after he is 4 days old.  TAKE CARE OF YOURSELF Take care of your breasts by:  Bathing or showering daily.   Avoiding the use of soaps on your nipples.   Start feedings on your left breast at one feeding and on your right breast at the next feeding.   You will notice an increase in your milk supply 2 to 5 days after delivery. You may feel some discomfort from engorgement, which makes your breasts very firm and often tender. Engorgement "peaks" out within 24 to 48 hours. In the meantime, apply warm moist towels to your breasts for 5 to 10 minutes before feeding. Gentle massage and expression of some milk before feeding will soften your breasts, making it easier for your baby to latch on. Wear a well fitting nursing  bra and air dry your nipples for 10 to 15 minutes after each feeding.   Only use cotton bra pads.   Only use pure lanolin on your nipples after nursing. You do not need to wash it off before nursing.  Take care of yourself by:   Eating well-balanced meals and nutritious snacks.   Drinking milk, fruit juice, and water to satisfy your thirst (about 8 glasses a day).   Getting plenty of rest.   Increasing calcium in your diet (1200 mg a day).   Avoiding foods that you notice affect the baby in a bad way.  SEEK MEDICAL CARE IF:   You have any questions or difficulty with breastfeeding.   You need help.   You have a hard, red, sore area on your breast, accompanied by a fever of 100.5 F (38.1 C) or more.   Your baby is too sleepy to eat well or is having trouble sleeping.   Your baby is wetting less than 6 diapers per day, by 5 days of age.   Your baby's skin or white part of his or her eyes is more yellow than it was in the hospital.   You feel depressed.  Document Released: 06/03/2005 Document Revised: 05/23/2011 Document Reviewed: 01/16/2009 [employer] Patient Information 2012 [employer]. 

## 2011-08-27 NOTE — Progress Notes (Signed)
Doing well. Labor precautions  

## 2011-08-31 ENCOUNTER — Inpatient Hospital Stay (HOSPITAL_COMMUNITY)
Admission: AD | Admit: 2011-08-31 | Discharge: 2011-09-03 | DRG: 775 | Disposition: A | Discharge status: Home / Self Care | Payer: Medicaid Other | Source: Ambulatory Visit | Attending: Family Medicine | Admitting: Family Medicine

## 2011-08-31 ENCOUNTER — Encounter (HOSPITAL_COMMUNITY): Payer: Self-pay | Admitting: *Deleted

## 2011-08-31 DIAGNOSIS — Z3400 Encounter for supervision of normal first pregnancy, unspecified trimester: Secondary | ICD-10-CM

## 2011-08-31 DIAGNOSIS — O4290 Premature rupture of membranes, unspecified as to length of time between rupture and onset of labor, unspecified weeks of gestation: Secondary | ICD-10-CM | POA: Diagnosis present

## 2011-08-31 NOTE — MAU Note (Signed)
Pt states, " I've been having contractions since 9:30 pm tonight and they are 3 min apart."

## 2011-09-01 ENCOUNTER — Encounter (HOSPITAL_COMMUNITY): Payer: Self-pay | Admitting: *Deleted

## 2011-09-01 ENCOUNTER — Encounter (HOSPITAL_COMMUNITY): Payer: Self-pay | Admitting: Anesthesiology

## 2011-09-01 ENCOUNTER — Encounter (HOSPITAL_COMMUNITY): Payer: Self-pay

## 2011-09-01 ENCOUNTER — Inpatient Hospital Stay (HOSPITAL_COMMUNITY): Payer: Medicaid Other | Admitting: Anesthesiology

## 2011-09-01 DIAGNOSIS — O4290 Premature rupture of membranes, unspecified as to length of time between rupture and onset of labor, unspecified weeks of gestation: Secondary | ICD-10-CM

## 2011-09-01 LAB — CBC
MCH: 28.1 pg (ref 26.0–34.0)
Platelets: 226 10*3/uL (ref 150–400)
RBC: 3.99 MIL/uL (ref 3.87–5.11)
WBC: 12.2 10*3/uL — ABNORMAL HIGH (ref 4.0–10.5)

## 2011-09-01 LAB — RPR: RPR Ser Ql: NONREACTIVE

## 2011-09-01 MED ORDER — ACETAMINOPHEN 325 MG PO TABS: 650.0000 mg | ORAL_TABLET | ORAL | Status: DC | PRN

## 2011-09-01 MED ORDER — ONDANSETRON HCL 4 MG/2ML IJ SOLN: 4.0000 mg | Freq: Four times a day (QID) | INTRAMUSCULAR | Status: DC | PRN

## 2011-09-01 MED ORDER — TERBUTALINE SULFATE 1 MG/ML IJ SOLN: 0.2500 mg | Freq: Once | INTRAMUSCULAR | Status: DC | PRN

## 2011-09-01 MED ORDER — FLEET ENEMA 7-19 GM/118ML RE ENEM: 1.0000 | ENEMA | RECTAL | Status: DC | PRN

## 2011-09-01 MED ORDER — ONDANSETRON HCL 4 MG PO TABS: 4.0000 mg | ORAL_TABLET | ORAL | Status: DC | PRN

## 2011-09-01 MED ORDER — LACTATED RINGERS IV SOLN: 500.0000 mL | INTRAVENOUS | Status: DC | PRN

## 2011-09-01 MED ORDER — PHENYLEPHRINE 40 MCG/ML (10ML) SYRINGE FOR IV PUSH (FOR BLOOD PRESSURE SUPPORT)
80.0000 ug | PREFILLED_SYRINGE | INTRAVENOUS | Status: DC | PRN
  Filled 2011-09-01: qty 5

## 2011-09-01 MED ORDER — BENZOCAINE-MENTHOL 20-0.5 % EX AERO
1.0000 "application " | INHALATION_SPRAY | CUTANEOUS | Status: DC | PRN
  Administered 2011-09-02: 1 via TOPICAL

## 2011-09-01 MED ORDER — OXYTOCIN 20 UNITS IN LACTATED RINGERS INFUSION - SIMPLE
125.0000 mL/h | Freq: Once | INTRAVENOUS | Status: AC
  Administered 2011-09-01: 999 mL/h via INTRAVENOUS

## 2011-09-01 MED ORDER — LACTATED RINGERS IV SOLN
500.0000 mL | Freq: Once | INTRAVENOUS | Status: AC
  Administered 2011-09-01: 1000 mL via INTRAVENOUS

## 2011-09-01 MED ORDER — SIMETHICONE 80 MG PO CHEW: 80.0000 mg | CHEWABLE_TABLET | ORAL | Status: DC | PRN

## 2011-09-01 MED ORDER — OXYTOCIN 20 UNITS IN LACTATED RINGERS INFUSION - SIMPLE
1.0000 m[IU]/min | INTRAVENOUS | Status: DC
  Administered 2011-09-01: 999 m[IU]/min via INTRAVENOUS
  Administered 2011-09-01: 10 m[IU]/min via INTRAVENOUS

## 2011-09-01 MED ORDER — NALBUPHINE SYRINGE 5 MG/0.5 ML
10.0000 mg | INJECTION | INTRAMUSCULAR | Status: DC | PRN
  Administered 2011-09-01: 10 mg via INTRAVENOUS
  Filled 2011-09-01: qty 1

## 2011-09-01 MED ORDER — OXYTOCIN BOLUS FROM INFUSION
500.0000 mL | Freq: Once | INTRAVENOUS | Status: DC
  Filled 2011-09-01: qty 1000
  Filled 2011-09-01: qty 500
  Filled 2011-09-01: qty 1000

## 2011-09-01 MED ORDER — EPHEDRINE 5 MG/ML INJ: 10.0000 mg | INTRAVENOUS | Status: DC | PRN

## 2011-09-01 MED ORDER — PRENATAL MULTIVITAMIN CH
1.0000 | ORAL_TABLET | Freq: Every day | ORAL | Status: DC
  Filled 2011-09-01: qty 1

## 2011-09-01 MED ORDER — DIBUCAINE 1 % RE OINT: 1.0000 "application " | TOPICAL_OINTMENT | RECTAL | Status: DC | PRN

## 2011-09-01 MED ORDER — OXYCODONE-ACETAMINOPHEN 5-325 MG PO TABS
1.0000 | ORAL_TABLET | ORAL | Status: DC | PRN
  Administered 2011-09-02: 1 via ORAL
  Filled 2011-09-01: qty 1

## 2011-09-01 MED ORDER — SALINE SPRAY 0.65 % NA SOLN
1.0000 | NASAL | Status: DC | PRN
  Administered 2011-09-01: 1 via NASAL
  Filled 2011-09-01: qty 44

## 2011-09-01 MED ORDER — OXYCODONE-ACETAMINOPHEN 5-325 MG PO TABS: 1.0000 | ORAL_TABLET | ORAL | Status: DC | PRN

## 2011-09-01 MED ORDER — LACTATED RINGERS IV SOLN
INTRAVENOUS | Status: DC
  Administered 2011-09-01 (×2): via INTRAVENOUS

## 2011-09-01 MED ORDER — LANOLIN HYDROUS EX OINT: TOPICAL_OINTMENT | CUTANEOUS | Status: DC | PRN

## 2011-09-01 MED ORDER — WITCH HAZEL-GLYCERIN EX PADS: 1.0000 "application " | MEDICATED_PAD | CUTANEOUS | Status: DC | PRN

## 2011-09-01 MED ORDER — DIPHENHYDRAMINE HCL 25 MG PO CAPS: 25.0000 mg | ORAL_CAPSULE | Freq: Four times a day (QID) | ORAL | Status: DC | PRN

## 2011-09-01 MED ORDER — TETANUS-DIPHTH-ACELL PERTUSSIS 5-2.5-18.5 LF-MCG/0.5 IM SUSP
0.5000 mL | Freq: Once | INTRAMUSCULAR | Status: DC
Start: 2011-09-02 — End: 2011-09-03

## 2011-09-01 MED ORDER — PNEUMOCOCCAL VAC POLYVALENT 25 MCG/0.5ML IJ INJ
0.5000 mL | INJECTION | INTRAMUSCULAR | Status: AC
  Administered 2011-09-03: 0.5 mL via INTRAMUSCULAR
  Filled 2011-09-01 (×2): qty 0.5

## 2011-09-01 MED ORDER — LIDOCAINE HCL (PF) 1 % IJ SOLN
INTRAMUSCULAR | Status: DC | PRN
  Administered 2011-09-01 (×2): 4 mL

## 2011-09-01 MED ORDER — IBUPROFEN 600 MG PO TABS: 600.0000 mg | ORAL_TABLET | Freq: Four times a day (QID) | ORAL | Status: DC | PRN

## 2011-09-01 MED ORDER — FENTANYL 2.5 MCG/ML BUPIVACAINE 1/10 % EPIDURAL INFUSION (WH - ANES)
14.0000 mL/h | INTRAMUSCULAR | Status: DC
  Administered 2011-09-01: 14 mL/h via EPIDURAL
  Filled 2011-09-01 (×2): qty 60

## 2011-09-01 MED ORDER — PHENYLEPHRINE 40 MCG/ML (10ML) SYRINGE FOR IV PUSH (FOR BLOOD PRESSURE SUPPORT): 80.0000 ug | PREFILLED_SYRINGE | INTRAVENOUS | Status: DC | PRN

## 2011-09-01 MED ORDER — CITRIC ACID-SODIUM CITRATE 334-500 MG/5ML PO SOLN: 30.0000 mL | ORAL | Status: DC | PRN

## 2011-09-01 MED ORDER — GUAIFENESIN 100 MG/5ML PO SOLN
5.0000 mL | ORAL | Status: DC | PRN
  Administered 2011-09-01 – 2011-09-02 (×2): 100 mg via ORAL
  Filled 2011-09-01 (×3): qty 15

## 2011-09-01 MED ORDER — LIDOCAINE HCL (PF) 1 % IJ SOLN: 30.0000 mL | INTRAMUSCULAR | Status: DC | PRN

## 2011-09-01 MED ORDER — SENNOSIDES-DOCUSATE SODIUM 8.6-50 MG PO TABS
2.0000 | ORAL_TABLET | Freq: Every day | ORAL | Status: DC
  Administered 2011-09-02: 2 via ORAL

## 2011-09-01 MED ORDER — ZOLPIDEM TARTRATE 5 MG PO TABS: 5.0000 mg | ORAL_TABLET | Freq: Every evening | ORAL | Status: DC | PRN

## 2011-09-01 MED ORDER — DIPHENHYDRAMINE HCL 50 MG/ML IJ SOLN: 12.5000 mg | INTRAMUSCULAR | Status: DC | PRN

## 2011-09-01 MED ORDER — IBUPROFEN 600 MG PO TABS
600.0000 mg | ORAL_TABLET | Freq: Four times a day (QID) | ORAL | Status: DC
Start: 2011-09-02 — End: 2011-09-03
  Administered 2011-09-01 – 2011-09-03 (×7): 600 mg via ORAL
  Filled 2011-09-01 (×7): qty 1

## 2011-09-01 MED ORDER — EPHEDRINE 5 MG/ML INJ
10.0000 mg | INTRAVENOUS | Status: DC | PRN
  Filled 2011-09-01: qty 4

## 2011-09-01 MED ORDER — ONDANSETRON HCL 4 MG/2ML IJ SOLN: 4.0000 mg | INTRAMUSCULAR | Status: DC | PRN

## 2011-09-01 MED ORDER — LORATADINE 10 MG PO TABS
10.0000 mg | ORAL_TABLET | Freq: Every day | ORAL | Status: DC
  Administered 2011-09-01 – 2011-09-03 (×3): 10 mg via ORAL
  Filled 2011-09-01 (×3): qty 1

## 2011-09-01 MED ORDER — FENTANYL 2.5 MCG/ML BUPIVACAINE 1/10 % EPIDURAL INFUSION (WH - ANES)
INTRAMUSCULAR | Status: DC | PRN
  Administered 2011-09-01: 14 mL/h via EPIDURAL

## 2011-09-01 NOTE — Anesthesia Preprocedure Evaluation (Addendum)
Anesthesia Evaluation  Patient identified by MRN, date of birth, ID band Patient awake    Reviewed: Allergy & Precautions, H&P , Patient's Chart, lab work & pertinent test results  History of Anesthesia Complications (+) PONV  Airway Mallampati: III TM Distance: >3 FB Neck ROM: Full    Dental No notable dental hx. (+) Teeth Intact   Pulmonary neg pulmonary ROS,  breath sounds clear to auscultation  Pulmonary exam normal       Cardiovascular negative cardio ROS  Rhythm:Regular Rate:Normal     Neuro/Psych Depression negative neurological ROS     GI/Hepatic negative GI ROS, Neg liver ROS,   Endo/Other  negative endocrine ROS  Renal/GU negative Renal ROS  negative genitourinary   Musculoskeletal   Abdominal   Peds  Hematology negative hematology ROS (+)   Anesthesia Other Findings   Reproductive/Obstetrics (+) Pregnancy                           Anesthesia Physical Anesthesia Plan  ASA: II  Anesthesia Plan: Epidural   Post-op Pain Management:    Induction:   Airway Management Planned:   Additional Equipment:   Intra-op Plan:   Post-operative Plan:   Informed Consent: I have reviewed the patients History and Physical, chart, labs and discussed the procedure including the risks, benefits and alternatives for the proposed anesthesia with the patient or authorized representative who has indicated his/her understanding and acceptance.     Plan Discussed with: CRNA and Anesthesiologist  Anesthesia Plan Comments:         Anesthesia Quick Evaluation

## 2011-09-01 NOTE — Anesthesia Procedure Notes (Signed)
Epidural Patient location during procedure: OB Start time: 09/01/2011 5:07 PM  Staffing Anesthesiologist: Kashmere Daywalt A. Performed by: anesthesiologist   Preanesthetic Checklist Completed: patient identified, site marked, surgical consent, pre-op evaluation, timeout performed, IV checked, risks and benefits discussed and monitors and equipment checked  Epidural Patient position: sitting Prep: site prepped and draped and DuraPrep Patient monitoring: continuous pulse ox and blood pressure Approach: midline Injection technique: LOR air  Needle:  Needle type: Tuohy  Needle gauge: 17 G Needle length: 9 cm Needle insertion depth: 5 cm cm Catheter type: closed end flexible Catheter size: 19 Gauge Catheter at skin depth: 10 cm Test dose: negative and Other  Assessment Events: blood not aspirated, injection not painful, no injection resistance, negative IV test and no paresthesia  Additional Notes Patient identified. Risks and benefits discussed including failed block, incomplete  Pain control, post dural puncture headache, nerve damage, paralysis, blood pressure Changes, nausea, vomiting, reactions to medications-both toxic and allergic and post Partum back pain. All questions were answered. Patient expressed understanding and wished to proceed. Sterile technique was used throughout procedure. Epidural site was Dressed with sterile barrier dressing. No paresthesias, signs of intravascular injection Or signs of intrathecal spread were encountered.  Patient was more comfortable after the epidural was dosed. Please see RN's note for documentation of vital signs and FHR which are stable.

## 2011-09-01 NOTE — H&P (Signed)
Maureen Perez is a 26 y.o. female presenting for labor.  She began feeling contractions around 9 pm.  No bleeding.   She was initially 1 cm upon arrival to the MAU.  She walked for one hour, she had SROM and her cervix changed to 2 centimeters.   Maternal Medical History:  Reason for admission: Reason for admission: rupture of membranes and contractions.  Contractions: Onset was 3-5 hours ago.   Frequency: irregular.   Perceived severity is mild.    Fetal activity: Perceived fetal activity is normal.   Last perceived fetal movement was within the past hour.      OB History    Grav Para Term Preterm Abortions TAB SAB Ect Mult Living   2 0 0 0 1 0 1 0 0 0      Past Medical History  Diagnosis Date  . Back pain   . Obesity   . Depression     no meds since pregancny  . Abnormal Pap smear     repeat WNL  . PONV (postoperative nausea and vomiting)    Past Surgical History  Procedure Date  . Tubes in ears age 82  . Tonsillectomy     age 76   Family History: family history includes Anesthesia problems in her father and mother; COPD in her mother; Depression in her mother and sister; Diabetes in her mother and sister; Hypertension in her father, mother, and sister; and Kidney disease in her sister. Social History:  reports that she has been smoking Cigarettes.  She has a 1.5 pack-year smoking history. She has never used smokeless tobacco. She reports that she does not drink alcohol or use illicit drugs.  ROS  Dilation: 2 Effacement (%): 30 Station: -3 Exam by:: Dr. Lula Olszewski Blood pressure 125/74, pulse 92, temperature 97.9 F (36.6 C), temperature source Oral, resp. rate 20, height 5\' 1"  (1.549 m), weight 80.343 kg (177 lb 2 oz), last menstrual period 12/05/2010. Maternal Exam:  Uterine Assessment: Contraction strength is mild.  Contraction duration is 7 minutes. Contraction frequency is irregular.   Abdomen: Patient reports no abdominal tenderness. Introitus: Normal vulva.  Normal vagina.  Amniotic fluid character: clear.  Pelvis: adequate for delivery.   Cervix: Cervix evaluated by digital exam.     Physical Exam  Constitutional: She is oriented to person, place, and time. She appears well-developed and well-nourished. No distress.  HENT:  Head: Normocephalic and atraumatic.  Eyes: Pupils are equal, round, and reactive to light.  Neck: Normal range of motion. Neck supple.  Cardiovascular: Normal rate and regular rhythm.   Respiratory: Effort normal and breath sounds normal.  GI:       Gravid, non-tender  Genitourinary: Vagina normal.  Musculoskeletal: She exhibits no edema.  Neurological: She is alert and oriented to person, place, and time.    SVE: 2/30%/-2 FHT's 140's, good variability, + accels, no decels Toco: Contractions every 7 minutes  Fern test in MAU +  Prenatal labs: ABO, Rh: O/POS/-- (08/01 1508) Antibody: NEG (08/01 1508) Rubella: 9.5 (08/01 1508) RPR: NON REAC (01/02 1155)  HBsAg: NEGATIVE (08/01 1508)  HIV: NON REACTIVE (01/02 1155)  GBS:   Negative.   Assessment/Plan: 26 year old G1 at [redacted]w[redacted]d who presents in early labor and now with SROM Admit, start LR @ 125 cc/hr Consider labor augmentation with pitocin since she is ruptured.  Patient undecided about epidural.   Rasool Rommel 09/01/2011, 2:09 AM

## 2011-09-01 NOTE — MAU Provider Note (Signed)
Chart reviewed and agree with management and plan.  

## 2011-09-01 NOTE — Progress Notes (Signed)
Maureen Perez is a 26 y.o. G2P0010 at [redacted]w[redacted]d by ultrasound admitted for induction of labor due to Spontaneous rupture of BOW.  Subjective:   Objective: BP 122/72  Pulse 66  Temp(Src) 97.6 F (36.4 C) (Oral)  Resp 20  Ht 5\' 1"  (1.549 m)  Wt 80.343 kg (177 lb 2 oz)  BMI 33.47 kg/m2  LMP 12/05/2010      FHT:  FHR: 130 bpm, variability: moderate,  accelerations:  Present,  decelerations:  Absent UC:   regular, every 2-3  minutes SVE:   Dilation: 1.5 Effacement (%): 50 Station: -3 Exam by:: Cresenzo CNM  Labs: Lab Results  Component Value Date   WBC 12.2* 09/01/2011   HGB 11.2* 09/01/2011   HCT 34.6* 09/01/2011   MCV 86.7 09/01/2011   PLT 226 09/01/2011    Assessment / Plan: Induction of labor due to PROM,  progressing well on pitocin  Labor: Progressing normally Preeclampsia:  no signs or symptoms of toxicity Fetal Wellbeing:  Category I Pain Control:  Labor support without medications I/D:  n/a Anticipated MOD:  NSVD  Emmabelle Fear DARLENE 09/01/2011, 1:16 PM

## 2011-09-01 NOTE — Progress Notes (Signed)
Patient ID: Maureen Perez, female   DOB: 08-26-1985, 26 y.o.   MRN: [id number] SVE 1-2/7-/-2, IUPC placed.

## 2011-09-01 NOTE — Progress Notes (Signed)
Telemetry efm applied pt to ambulate with family

## 2011-09-01 NOTE — Progress Notes (Signed)
Pt reports that ucs still hurt but are better.  Observed response:  Pt sleeping between ucs awakens and breathes with ucs at apex only.  She appears considerably more relaxed

## 2011-09-01 NOTE — MAU Provider Note (Signed)
History     CSN: [id number]  Arrival date and time: 08/31/11 2317   First Provider Initiated Contact with Patient 09/01/11 0020      Chief Complaint  Patient presents with  . Contractions   HPI  Maureen Perez comes in because of contractions that started at 9 pm.  She feels them every three minutes.  She had a check up at [facility] on Tuesday, and says they said her cervix was 1 cm.  She has not had vaginal bleeding or discharge.  She has had a normal pregnancy.   OB History    Grav Para Term Preterm Abortions TAB SAB Ect Mult Living   2 0 0 0 1 0 1 0 0 0       Past Medical History  Diagnosis Date  . Back pain   . Obesity   . Depression     no meds since pregancny  . Abnormal Pap smear     repeat WNL  . PONV (postoperative nausea and vomiting)     Past Surgical History  Procedure Date  . Tubes in ears age 89  . Tonsillectomy     age 64    Family History  Problem Relation Age of Onset  . Depression Mother   . Diabetes Mother   . Hypertension Mother   . COPD Mother   . Anesthesia problems Mother   . Depression Sister   . Diabetes Sister   . Kidney disease Sister   . Hypertension Sister   . Hypertension Father   . Anesthesia problems Father     History  Substance Use Topics  . Smoking status: Current Everyday Smoker -- 0.2 packs/day for 6 years    Types: Cigarettes  . Smokeless tobacco: Never Used  . Alcohol Use: No    Allergies:  Allergies  Allergen Reactions  . Keflex Itching and Nausea And Vomiting    Prescriptions prior to admission  Medication Sig Dispense Refill  . acetaminophen (TYLENOL) 500 MG tablet Take 500 mg by mouth every 6 (six) hours as needed. For pain      . desloratadine (CLARINEX) 5 MG tablet Take 1 tablet (5 mg total) by mouth daily.  30 tablet  2  . guaiFENesin-dextromethorphan (ROBITUSSIN DM) 100-10 MG/5ML syrup Take 5 mLs by mouth 3 (three) times daily as needed. For cough and cold      . Prenatal Vit-Fe Fumarate-FA (PRENATAL  MULTIVITAMIN) TABS Take 1 tablet by mouth every morning.         Review of Systems  Constitutional: Negative for fever.  Eyes: Negative for double vision.  Respiratory: Negative for shortness of breath.   Cardiovascular: Negative for chest pain.  Gastrointestinal: Negative for nausea.  Genitourinary: Negative for dysuria.  Skin: Negative for rash.  Neurological: Negative for headaches.   Physical Exam   Blood pressure 125/74, pulse 92, temperature 97.9 F (36.6 C), temperature source Oral, resp. rate 20, height 5\' 1"  (1.549 m), weight 80.343 kg (177 lb 2 oz), last menstrual period 12/05/2010.  Physical Exam  Nursing note and vitals reviewed. Constitutional: She is oriented to person, place, and time. She appears well-developed and well-nourished. No distress.  HENT:  Head: Normocephalic and atraumatic.  Eyes: EOM are normal. Pupils are equal, round, and reactive to light.  Neck: No JVD present.  Cardiovascular: Normal rate, regular rhythm and normal heart sounds.   Respiratory: Effort normal and breath sounds normal.  GI:       Gravid, non-tender.   Genitourinary:  Cervical Exam: 1/30%/-2 FHT's: baseline 140's. +accels, no decels. Good variability Toco: Few scattered contractions 7-10 minutes apart.   Musculoskeletal: She exhibits no edema.  Neurological: She is alert and oriented to person, place, and time.    MAU Course  Procedures  MDM Patient's cervix unchanged from exam in office earlier this week, a few contractions are present on toco.  Will have her walk for one hour and re-check cervix.   After walking one hour, Pt had copius SROM, cervix was 2/30%/-2  Assessment and Plan  Early Labor, SROM -Admit to L & D.   [name] 09/01/2011, 1:41 AM

## 2011-09-01 NOTE — Progress Notes (Signed)
Discussed with pt and family labor and comfort measures;  Including use and efficacy of IVPM, epidural, and non pharmacologic methods to cope with pain of labor.  PT and family verbalize understanding

## 2011-09-01 NOTE — Progress Notes (Signed)
Maureen Perez is a 26 y.o. G2P0010 at [redacted]w[redacted]d admitted for PROM  Subjective: Was walking, now resting.   Objective: BP 132/72  Pulse 62  Temp(Src) 98.4 F (36.9 C) (Oral)  Resp 18  Ht 5\' 1"  (1.549 m)  Wt 177 lb 2 oz (80.343 kg)  BMI 33.47 kg/m2  LMP 12/05/2010      FHT:  FHR: 120 bpm, variability: moderate,  accelerations:  Present,  decelerations:  Absent UC:   irregular, every 2-6 minutes SVE:   Dilation: 1.5 Effacement (%): 50 Station: -3 Exam by:: Cresenzo CNM  Labs: Lab Results  Component Value Date   WBC 12.2* 09/01/2011   HGB 11.2* 09/01/2011   HCT 34.6* 09/01/2011   MCV 86.7 09/01/2011   PLT 226 09/01/2011    Assessment / Plan: Augmentation with pit due to PROM  Labor: not active Fetal Wellbeing:  Category I Pain Control:  Labor support without medications I/D:  n/a Anticipated MOD:  NSVD  Dorissa Stinnette 09/01/2011, 9:56 AM

## 2011-09-01 NOTE — Progress Notes (Signed)
   JERELINE TICER is a 26 y.o. G2P0010 at [redacted]w[redacted]d  admitted for PROM  Subjective: Contractions are beginning to hurt  Objective: BP 122/74  Pulse 77  Temp(Src) 97.7 F (36.5 C) (Axillary)  Resp 18  Ht 5\' 1"  (1.549 m)  Wt 177 lb 2 oz (80.343 kg)  BMI 33.47 kg/m2  LMP 12/05/2010    FHT:  FHR: 130 bpm, variability: moderate,  accelerations:  Present,  decelerations:  Absent UC:   irregular, every 4-6 minutes SVE:   Dilation: 1.5 Effacement (%): 50 Station: -3 Exam by:: Cresenzo CNM Pitocin at 6 mu/min  Labs: Lab Results  Component Value Date   WBC 12.2* 09/01/2011   HGB 11.2* 09/01/2011   HCT 34.6* 09/01/2011   MCV 86.7 09/01/2011   PLT 226 09/01/2011    Assessment / Plan: augmentation due to PROM; increase Pitocin  Labor: no Fetal Wellbeing:  Category I Pain Control:  Labor support without medications Anticipated MOD:  NSVD  CRESENZO-DISHMAN,Dequon Schnebly 09/01/2011, 6:56 AM

## 2011-09-01 NOTE — H&P (Signed)
Chart reviewed and agree with management and plan.  

## 2011-09-01 NOTE — Progress Notes (Signed)
Pt returned from walking sitting in bed dozing.  Returned to regular efm

## 2011-09-02 MED ORDER — MEASLES, MUMPS & RUBELLA VAC ~~LOC~~ INJ
0.5000 mL | INJECTION | Freq: Once | SUBCUTANEOUS | Status: AC
  Administered 2011-09-03: 0.5 mL via SUBCUTANEOUS
  Filled 2011-09-02 (×2): qty 0.5

## 2011-09-02 MED ORDER — GUAIFENESIN 100 MG/5ML PO SOLN: 15.0000 mL | ORAL | Status: DC | PRN

## 2011-09-02 MED ORDER — BENZOCAINE-MENTHOL 20-0.5 % EX AERO
INHALATION_SPRAY | CUTANEOUS | Status: AC
  Administered 2011-09-02: 1 via TOPICAL
  Filled 2011-09-02: qty 56

## 2011-09-02 NOTE — Progress Notes (Signed)
Clinical Social Work Department BRIEF PSYCHOSOCIAL ASSESSMENT 09/02/2011  Patient:  RAYNETTA, OSTERLOH     Account Number:  [id number]     Admit date:  08/31/2011  Clinical Social Worker:  Andy Gauss  Date/Time:  09/02/2011 10:00 AM  Referred by:  Physician  Date Referred:  09/02/2011 Referred for  Behavioral Health Issues   Other Referral:   History of Depression  Unstable housing   Interview type:  Patient Other interview type:    PSYCHOSOCIAL DATA Living Status:  PARENTS Admitted from facility:   Level of care:   Primary support name:  Merlyn Albert and Lesle Chris Primary support relationship to patient:  PARENT Degree of support available:   Involved    CURRENT CONCERNS Current Concerns  Behavioral Health Issues   Other Concerns:    SOCIAL WORK ASSESSMENT / PLAN Pt acknowledges a depression history stating she was diagnosed 2 years ago by Dr. Marina Gravel at [facility].  Pt told Sw that she cried a lot when she thought about her past family issues and felt anger towards them.  She was prescribed Paxil of which she took until pregnancy confirmation.  She reports being able to cope well without medication during pregnancy but plans to restart upon discharge.  She denies any current depression or history of SI.  FOB is at the bedside, aware of pt's history and supportive.  Pt and her family did live in a hotel Dec.'11 through July '12 but have stable housing now.  She reports having good family support and all the necessary supplies for the infant.  Sw will assist further if needed.   Assessment/plan status:  No Further Intervention Required Other assessment/ plan:   Information/referral to community resources:   Feelings After [employer] brochure    PATIENT'S/FAMILY'S RESPONSE TO PLAN OF CARE:  Pt and FOB thanked Sw for information.

## 2011-09-02 NOTE — Anesthesia Postprocedure Evaluation (Signed)
  Anesthesia Post-op Note  Patient: Maureen Perez  Procedure(s) Performed: * No procedures listed *  Patient Location: Mother/Baby  Anesthesia Type: Epidural  Level of Consciousness: awake  Airway and Oxygen Therapy: Patient Spontanous Breathing  Post-op Pain: none  Post-op Assessment: Patient's Cardiovascular Status Stable, Respiratory Function Stable, Patent Airway, No signs of Nausea or vomiting, Pain level controlled, No headache, No backache, No residual numbness and No residual motor weakness  Post-op Vital Signs: Reviewed and stable  Complications: No apparent anesthesia complications

## 2011-09-02 NOTE — Progress Notes (Signed)
Post Partum Day #1 Subjective: no complaints, up ad lib, voiding and tolerating PO Bottle feeding. Unsure of contraception.   Objective: Blood pressure 119/73, pulse 73, temperature 98.6 F (37 C), temperature source Oral, resp. rate 18, height 5\' 1"  (1.549 m), weight 80.343 kg (177 lb 2 oz), last menstrual period 12/05/2010, SpO2 99.00%, unknown if currently breastfeeding.  Physical Exam:  General: alert, cooperative and no distress Lochia: appropriate Uterine Fundus: firm Incision: N/A DVT Evaluation: No evidence of DVT seen on physical exam.   Basename 09/01/11 0255  HGB 11.2*  HCT 34.6*    Assessment/Plan: Plan for discharge tomorrow, Social Work consult and Contraception undecided Will follow up at [facility]. All questions and concerns addressed.   LOS: 2 days   Dakari Cregger 09/02/2011, 7:41 AM

## 2011-09-02 NOTE — Progress Notes (Signed)
UR chart review completed.  

## 2011-09-03 ENCOUNTER — Encounter: Payer: Medicaid Other | Admitting: Family Medicine

## 2011-09-03 MED ORDER — OXYCODONE-ACETAMINOPHEN 5-325 MG PO TABS: 1.0000 | ORAL_TABLET | ORAL | Status: AC | PRN

## 2011-09-03 MED ORDER — IBUPROFEN 600 MG PO TABS: 600.0000 mg | ORAL_TABLET | Freq: Four times a day (QID) | ORAL | Status: AC

## 2011-09-03 NOTE — Discharge Summary (Signed)
Obstetric Discharge Summary Reason for Admission: onset of labor Prenatal Procedures: NST and ultrasound Intrapartum Procedures: spontaneous vaginal delivery Postpartum Procedures: none Complications-Operative and Postpartum: 2nd degree perineal laceration Hemoglobin  Date Value Range Status  09/01/2011 11.2* 12.0-15.0 (g/dL) Final     HCT  Date Value Range Status  09/01/2011 34.6* 36.0-46.0 (%) Final    Physical Exam:  General: alert, cooperative and no distress Lochia: appropriate Uterine Fundus: firm Incision: healing well DVT Evaluation: No evidence of DVT seen on physical exam.  Discharge Diagnoses: Term Pregnancy-delivered  Discharge Information: Date: 09/03/2011 Activity: pelvic rest Diet: routine Medications: Ibuprofen and Percocet Condition: stable Instructions: refer to practice specific booklet Discharge to: home   Newborn Data: Live born female  Birth Weight: 6 lb 2.9 oz (2805 g) APGAR: 9,   Home with mother. SW consulted during admission. Discharged home in stable medical condition. Bottle feeding. Will follow up at [facility]. Still undecided about birth control  Demarques Pilz 09/03/2011, 8:51 AM

## 2011-09-03 NOTE — Discharge Instructions (Signed)
Vaginal Delivery Care After  Change your pad on each trip to the bathroom.   Wipe gently with toilet paper during your hospital stay. Always wipe from front to back. A spray bottle with warm tap water could also be used or a towelette if available.   Place your soiled pad and toilet paper in a bathroom wastebasket with a plastic bag liner.   During your hospital stay, save any clots. If you pass a clot while on the toilet, do not flush it. Also, if your vaginal flow seems excessive to you, notify nursing personnel.   The first time you get out of bed after delivery, wait for assistance from a nurse. Do not get up alone at any time if you feel weak or dizzy.   Bend and extend your ankles forcefully so that you feel the calves of your legs get hard. Do this 6 times every hour when you are in bed and awake.   Do not sit with one foot under you, dangle your legs over the edge of the bed, or maintain a position that hinders the circulation in your legs.   Many women experience after pains for 2 to 3 days after delivery. These after pains are mild uterine contractions. Ask the nurse for a pain medication if you need something for this. Sometimes breastfeeding stimulates after pains; if you find this to be true, ask for the medication  -  hour before the next feeding.   For you and your infant's protection, do not go beyond the door(s) of the obstetric unit. Do not carry your baby in your arms in the hallway. When taking your baby to and from your room, put your baby in the bassinet and push the bassinet.   Mothers may have their babies in their room as much as they desire.  Document Released: 05/31/2000 Document Revised: 05/23/2011 Document Reviewed: 05/01/2007 [employer] Patient Information 2012 [employer]. 

## 2011-09-03 NOTE — Discharge Summary (Signed)
Agree with above note.  Maureen Perez 09/03/2011 10:18 AM

## 2011-09-03 NOTE — Progress Notes (Signed)
Post Partum Day #2 Subjective: no complaints, up ad lib, voiding, tolerating PO and + flatus Patient is bottle feeding. Doing well. No concerns this morning  Objective: Blood pressure 131/86, pulse 70, temperature 98.2 F (36.8 C), temperature source Oral, resp. rate 18, height 5\' 1"  (1.549 m), weight 80.343 kg (177 lb 2 oz), last menstrual period 12/05/2010, SpO2 99.00%, unknown if currently breastfeeding.  Physical Exam:  General: alert, cooperative and no distress Lochia: appropriate Uterine Fundus: firm Incision: N/A DVT Evaluation: No evidence of DVT seen on physical exam.   Basename 09/01/11 0255  HGB 11.2*  HCT 34.6*    Assessment/Plan: Discharge home and Contraception undecided Bottle feeding. SW consult yesterday. Patient states she has everything she needs at home.   LOS: 3 days   Maureen Perez 09/03/2011, 7:57 AM

## 2011-09-10 NOTE — ED Provider Notes (Signed)
Attestation of Attending Supervision of Advanced Practitioner: Evaluation and management procedures were performed by the PA/NP/CNM/OB Fellow under my supervision/collaboration. Chart reviewed and agree with management and plan.  Marin Milley V 09/10/2011 1:06 PM

## 2011-09-12 ENCOUNTER — Ambulatory Visit (INDEPENDENT_AMBULATORY_CARE_PROVIDER_SITE_OTHER): Payer: Medicaid Other | Admitting: Emergency Medicine

## 2011-09-12 VITALS — BP 143/97 | HR 91 | Ht 61.0 in | Wt 157.0 lb

## 2011-09-12 DIAGNOSIS — F331 Major depressive disorder, recurrent, moderate: Secondary | ICD-10-CM

## 2011-09-12 MED ORDER — PAROXETINE HCL 20 MG PO TABS: 20.0000 mg | ORAL_TABLET | ORAL | Status: AC

## 2011-09-12 NOTE — Patient Instructions (Signed)
Congratulations!  I'm glad you have a healthy baby girl!  We decided to have you pick up a prescription for Paxil.  You do not need to start taking these right away.  Please start taking them daily if you start to notice you're having more symptoms of your depression and let me know if you start taking them.  I will see you back for a physical in the next couple of month or as needed.

## 2011-09-12 NOTE — Assessment & Plan Note (Signed)
No current symptoms of depression.  As patient can usually tell when she is getting depressed, will have her pick up prescription for Paxil, but only start taking it if she develops symptoms.  Given MDQ, some concern for bipolar, however her depression seems to be primarily described by irritability and she has a history of good response to Paxil.

## 2011-09-12 NOTE — Progress Notes (Signed)
  Subjective:    Patient ID: Maureen Perez, female    DOB: [birth year], 26 y.o.   MRN: [id number]  HPI [name] is here to discuss restarting depression medication.  She has a history of moderate, recurrent depression diagnosed in 2010.  She was on Paxil with good response.  This was stopped during her pregnancy.  Both the patient and her mother report increased irritability (which is common with her depression) during the pregnancy.  She delivered about 1.5 weeks ago.  She is NOT breastfeeding.  Mood currently described as good, with rare symptoms of appetite changes, decreased energy, difficulty sleeping.  No anhedonia, decreased mood, trouble concentrating, suicidal ideation.    PHQ-9: 3 MDQ: 5 (irritability, less sleep, racing thoughts, easily distracted, more interest in sex)  I have reviewed and updated the following as appropriate: allergies and current medications  Review of Systems See HPI    Objective:   Physical Exam BP 143/97  Pulse 91  Ht 5\' 1"  (1.549 m)  Wt 157 lb (71.215 kg)  BMI 29.67 kg/m2  LMP 12/05/2010 Gen: alert, cooperative, NAD CV: RRR, no murmurs Pulm: CTAB, no wheezes Psych: mood happy, affect congruent with mood, good insight/judgement     Assessment & Plan:

## 2011-10-07 ENCOUNTER — Other Ambulatory Visit (HOSPITAL_COMMUNITY)
Admission: RE | Admit: 2011-10-07 | Discharge: 2011-10-07 | Disposition: A | Discharge status: Home / Self Care | Payer: Medicaid Other | Source: Ambulatory Visit | Attending: Obstetrics & Gynecology | Admitting: Obstetrics & Gynecology

## 2011-10-07 ENCOUNTER — Encounter: Payer: Self-pay | Admitting: Obstetrics & Gynecology

## 2011-10-07 ENCOUNTER — Ambulatory Visit (INDEPENDENT_AMBULATORY_CARE_PROVIDER_SITE_OTHER): Payer: Medicaid Other | Admitting: Obstetrics & Gynecology

## 2011-10-07 VITALS — BP 132/75 | HR 73 | Ht 61.0 in | Wt 152.0 lb

## 2011-10-07 DIAGNOSIS — IMO0002 Reserved for concepts with insufficient information to code with codable children: Secondary | ICD-10-CM

## 2011-10-07 DIAGNOSIS — R87612 Low grade squamous intraepithelial lesion on cytologic smear of cervix (LGSIL): Secondary | ICD-10-CM | POA: Insufficient documentation

## 2011-10-07 DIAGNOSIS — Z01419 Encounter for gynecological examination (general) (routine) without abnormal findings: Secondary | ICD-10-CM | POA: Insufficient documentation

## 2011-10-07 NOTE — Progress Notes (Signed)
  Subjective:     Maureen Perez is a 26 y.o. female who presents for a postpartum visit. She is 4 weeks postpartum following a spontaneous vaginal delivery. I have fully reviewed the prenatal and intrapartum course. The delivery was at 38 gestational weeks. Anesthesia: epidural. Postpartum course has been uncomplicated. Baby's course has been uncomplicated. Baby is feeding by bottle. Bleeding: no bleeding. Bowel function is normal. Bladder function is normal. Patient is not sexually active. Contraception method is Nexplanon. Postpartum depression screening: negative with a score of 1.  Patient was seen by her PCP, Dr.Erin Elwyn Reach on 09/12/11 and prescribed Paxil for depression, she reports that she has not started  The medication as she feels she does not need it for now.  The following portions of the patient's history were reviewed and updated as appropriate: allergies, current medications, past family history, past medical history, past social history, past surgical history and problem list.  Patient smokes 1/2 ppd of cigarettes, trying to quit.  Review of Systems Pertinent items are noted in HPI.   Objective:    BP 132/75  Pulse 73  Ht 5\' 1"  (1.549 m)  Wt 152 lb (68.947 kg)  BMI 28.72 kg/m2  LMP 09/17/2011  Breastfeeding? No  General:  alert and no distress   Breasts:  inspection negative, no nipple discharge or bleeding, no masses or nodularity palpable  Lungs: clear to auscultation bilaterally  Heart:  regular rate and rhythm  Abdomen: soft, non-tender; bowel sounds normal; no masses,  no organomegaly   Vulva:  normal  Vagina: normal vagina  Cervix:  multiparous appearance, no bleeding following Pap and no lesions  Corpus: normal size, contour, position, consistency, mobility, non-tender  Adnexa:  normal adnexa and no mass, fullness, tenderness  Rectal Exam: Not performed.        Assessment:    Normal postpartum exam. Pap smear done at today's visit as patient had an LGSIL pap  during pregnancy in 02/2011.   Plan:   1. Contraception: Nexplanon and patient will make an appointment for placement.  Advised abstinence or only protected intercourse until placement. 2. Smoking: Discussed cessation resources especially Wellbutrin given her depression history.  Patient declines any help for now. 3. For her cervical dysplasia, patient needs pap smears every six months until she has two consecutive normal pap smears, then she can resume annual screening. If any pap smear is abnormal, she will need repeat colposcopy and appropriate evaluation. 4. Routine postpartum care

## 2011-10-07 NOTE — Progress Notes (Signed)
Patient is here for post partum visit.  She is doing well and the baby is also doing well.

## 2011-10-07 NOTE — Patient Instructions (Addendum)
Etonogestrel implant/Nexplanon What is this medicine? ETONOGESTREL is a contraceptive (birth control) device. It is used to prevent pregnancy. It can be used for up to 3 years. This medicine may be used for other purposes; ask your health care provider or pharmacist if you have questions. What should I tell my health care provider before I take this medicine? They need to know if you have any of these conditions: -abnormal vaginal bleeding -blood vessel disease or blood clots -cancer of the breast, cervix, or liver -depression -diabetes -gallbladder disease -headaches -heart disease or recent heart attack -high blood pressure -high cholesterol -kidney disease -liver disease -renal disease -seizures -tobacco smoker -an unusual or allergic reaction to etonogestrel, other hormones, anesthetics or antiseptics, medicines, foods, dyes, or preservatives -pregnant or trying to get pregnant -breast-feeding How should I use this medicine? This device is inserted just under the skin on the inner side of your upper arm by a health care professional. Talk to your pediatrician regarding the use of this medicine in children. Special care may be needed. Overdosage: If you think you've taken too much of this medicine contact a poison control center or emergency room at once. Overdosage: If you think you have taken too much of this medicine contact a poison control center or emergency room at once. NOTE: This medicine is only for you. Do not share this medicine with others. What if I miss a dose? This does not apply. What may interact with this medicine? Do not take this medicine with any of the following medications: -amprenavir -bosentan -fosamprenavir This medicine may also interact with the following medications: -barbiturate medicines for inducing sleep or treating seizures -certain medicines for fungal infections like ketoconazole and itraconazole -griseofulvin -medicines to treat  seizures like carbamazepine, felbamate, oxcarbazepine, phenytoin, topiramate -modafinil -phenylbutazone -rifampin -some medicines to treat HIV infection like atazanavir, indinavir, lopinavir, nelfinavir, tipranavir, ritonavir -St. John's wort This list may not describe all possible interactions. Give your health care provider a list of all the medicines, herbs, non-prescription drugs, or dietary supplements you use. Also tell them if you smoke, drink alcohol, or use illegal drugs. Some items may interact with your medicine. What should I watch for while using this medicine? This product does not protect you against HIV infection (AIDS) or other sexually transmitted diseases. You should be able to feel the implant by pressing your fingertips over the skin where it was inserted. Tell your doctor if you cannot feel the implant. What side effects may I notice from receiving this medicine? Side effects that you should report to your doctor or health care professional as soon as possible: -allergic reactions like skin rash, itching or hives, swelling of the face, lips, or tongue -breast lumps -changes in vision -confusion, trouble speaking or understanding -dark urine -depressed mood -general ill feeling or flu-like symptoms -light-colored stools -loss of appetite, nausea -right upper belly pain -severe headaches -severe pain, swelling, or tenderness in the abdomen -shortness of breath, chest pain, swelling in a leg -signs of pregnancy -sudden numbness or weakness of the face, arm or leg -trouble walking, dizziness, loss of balance or coordination -unusual vaginal bleeding, discharge -unusually weak or tired -yellowing of the eyes or skin Side effects that usually do not require medical attention (Report these to your doctor or health care professional if they continue or are bothersome.): -acne -breast pain -changes in weight -cough -fever or chills -headache -irregular menstrual  bleeding -itching, burning, and vaginal discharge -pain or difficulty passing urine -sore throat This  list may not describe all possible side effects. Call your doctor for medical advice about side effects. You may report side effects to FDA at 1-800-FDA-1088. Where should I keep my medicine? This drug is given in a hospital or clinic and will not be stored at home. NOTE: This sheet is a summary. It may not cover all possible information. If you have questions about this medicine, talk to your doctor, pharmacist, or health care provider.  2012, [employer]. (02/24/2009 3:54:17 PM)Preventive Care for Adults, Female A healthy lifestyle and preventive care can promote health and wellness. Preventive health guidelines for women include the following key practices.  A routine yearly physical is a good way to check with your caregiver about your health and preventive screening. It is a chance to share any concerns and updates on your health, and to receive a thorough exam.   Visit your dentist for a routine exam and preventive care every 6 months. Brush your teeth twice a day and floss once a day. Good oral hygiene prevents tooth decay and gum disease.   The frequency of eye exams is based on your age, health, family medical history, use of contact lenses, and other factors. Follow your caregiver's recommendations for frequency of eye exams.   Eat a healthy diet. Foods like vegetables, fruits, whole grains, low-fat dairy products, and lean protein foods contain the nutrients you need without too many calories. Decrease your intake of foods high in solid fats, added sugars, and salt. Eat the right amount of calories for you.Get information about a proper diet from your caregiver, if necessary.   Regular physical exercise is one of the most important things you can do for your health. Most adults should get at least 150 minutes of moderate-intensity exercise (any activity that increases your  heart rate and causes you to sweat) each week. In addition, most adults need muscle-strengthening exercises on 2 or more days a week.   Maintain a healthy weight. The body mass index (BMI) is a screening tool to identify possible weight problems. It provides an estimate of body fat based on height and weight. Your caregiver can help determine your BMI, and can help you achieve or maintain a healthy weight.For adults 20 years and older:   A BMI below 18.5 is considered underweight.   A BMI of 18.5 to 24.9 is normal.   A BMI of 25 to 29.9 is considered overweight.   A BMI of 30 and above is considered obese.   Maintain normal blood lipids and cholesterol levels by exercising and minimizing your intake of saturated fat. Eat a balanced diet with plenty of fruit and vegetables. Blood tests for lipids and cholesterol should begin at age 61 and be repeated every 5 years. If your lipid or cholesterol levels are high, you are over 50, or you are at high risk for heart disease, you may need your cholesterol levels checked more frequently.Ongoing high lipid and cholesterol levels should be treated with medicines if diet and exercise are not effective.   If you smoke, find out from your caregiver how to quit. If you do not use tobacco, do not start.   If you are pregnant, do not drink alcohol. If you are breastfeeding, be very cautious about drinking alcohol. If you are not pregnant and choose to drink alcohol, do not exceed 1 drink per day. One drink is considered to be 12 ounces (355 mL) of beer, 5 ounces (148 mL) of wine, or 1.5 ounces (44  mL) of liquor.   Avoid use of street drugs. Do not share needles with anyone. Ask for help if you need support or instructions about stopping the use of drugs.   High blood pressure causes heart disease and increases the risk of stroke. Your blood pressure should be checked at least every 1 to 2 years. Ongoing high blood pressure should be treated with medicines if  weight loss and exercise are not effective.   If you are 51 to 24 years old, ask your caregiver if you should take aspirin to prevent strokes.   Diabetes screening involves taking a blood sample to check your fasting blood sugar level. This should be done once every 3 years, after age 31, if you are within normal weight and without risk factors for diabetes. Testing should be considered at a younger age or be carried out more frequently if you are overweight and have at least 1 risk factor for diabetes.   Breast cancer screening is essential preventive care for women. You should practice "breast self-awareness." This means understanding the normal appearance and feel of your breasts and may include breast self-examination. Any changes detected, no matter how small, should be reported to a caregiver. Women in their 58s and 30s should have a clinical breast exam (CBE) by a caregiver as part of a regular health exam every 1 to 3 years. After age 8, women should have a CBE every year. Starting at age 68, women should consider having a mammography (breast X-ray test) every year. Women who have a family history of breast cancer should talk to their caregiver about genetic screening. Women at a high risk of breast cancer should talk to their caregivers about having magnetic resonance imaging (MRI) and a mammography every year.   The Pap test is a screening test for cervical cancer. A Pap test can show cell changes on the cervix that might become cervical cancer if left untreated. A Pap test is a procedure in which cells are obtained and examined from the lower end of the uterus (cervix).   Women should have a Pap test starting at age 44.   Between ages 68 and 34, Pap tests should be repeated every 2 years.   Beginning at age 75, you should have a Pap test every 3 years as long as the past 3 Pap tests have been normal.   Some women have medical problems that increase the chance of getting cervical cancer.  Talk to your caregiver about these problems. It is especially important to talk to your caregiver if a new problem develops soon after your last Pap test. In these cases, your caregiver may recommend more frequent screening and Pap tests.   The above recommendations are the same for women who have or have not gotten the vaccine for human papillomavirus (HPV).   If you had a hysterectomy for a problem that was not cancer or a condition that could lead to cancer, then you no longer need Pap tests. Even if you no longer need a Pap test, a regular exam is a good idea to make sure no other problems are starting.   If you are between ages 27 and 30, and you have had normal Pap tests going back 10 years, you no longer need Pap tests. Even if you no longer need a Pap test, a regular exam is a good idea to make sure no other problems are starting.   If you have had past treatment for cervical cancer or  a condition that could lead to cancer, you need Pap tests and screening for cancer for at least 20 years after your treatment.   If Pap tests have been discontinued, risk factors (such as a new sexual partner) need to be reassessed to determine if screening should be resumed.   The HPV test is an additional test that may be used for cervical cancer screening. The HPV test looks for the virus that can cause the cell changes on the cervix. The cells collected during the Pap test can be tested for HPV. The HPV test could be used to screen women aged 59 years and older, and should be used in women of any age who have unclear Pap test results. After the age of 56, women should have HPV testing at the same frequency as a Pap test.   Colorectal cancer can be detected and often prevented. Most routine colorectal cancer screening begins at the age of 81 and continues through age 1. However, your caregiver may recommend screening at an earlier age if you have risk factors for colon cancer. On a yearly basis, your  caregiver may provide home test kits to check for hidden blood in the stool. Use of a small camera at the end of a tube, to directly examine the colon (sigmoidoscopy or colonoscopy), can detect the earliest forms of colorectal cancer. Talk to your caregiver about this at age 34, when routine screening begins. Direct examination of the colon should be repeated every 5 to 10 years through age 37, unless early forms of pre-cancerous polyps or small growths are found.   Hepatitis C blood testing is recommended for all people born from [birth year] through 1965 and any individual with known risks for hepatitis C.   Practice safe sex. Use condoms and avoid high-risk sexual practices to reduce the spread of sexually transmitted infections (STIs). STIs include gonorrhea, chlamydia, syphilis, trichomonas, herpes, HPV, and human immunodeficiency virus (HIV). Herpes, HIV, and HPV are viral illnesses that have no cure. They can result in disability, cancer, and death. Sexually active women aged 34 and younger should be checked for chlamydia. Older women with new or multiple partners should also be tested for chlamydia. Testing for other STIs is recommended if you are sexually active and at increased risk.   Osteoporosis is a disease in which the bones lose minerals and strength with aging. This can result in serious bone fractures. The risk of osteoporosis can be identified using a bone density scan. Women ages 13 and over and women at risk for fractures or osteoporosis should discuss screening with their caregivers. Ask your caregiver whether you should take a calcium supplement or vitamin D to reduce the rate of osteoporosis.   Menopause can be associated with physical symptoms and risks. Hormone replacement therapy is available to decrease symptoms and risks. You should talk to your caregiver about whether hormone replacement therapy is right for you.   Use sunscreen with sun protection factor (SPF) of 30 or more. Apply  sunscreen liberally and repeatedly throughout the day. You should seek shade when your shadow is shorter than you. Protect yourself by wearing long sleeves, pants, a wide-brimmed hat, and sunglasses year round, whenever you are outdoors.   Once a month, do a whole body skin exam, using a mirror to look at the skin on your back. Notify your caregiver of new moles, moles that have irregular borders, moles that are larger than a pencil eraser, or moles that have changed in shape or  color.   Stay current with required immunizations.   Influenza. You need a dose every fall (or winter). The composition of the flu vaccine changes each year, so being vaccinated once is not enough.   Pneumococcal polysaccharide. You need 1 to 2 doses if you smoke cigarettes or if you have certain chronic medical conditions. You need 1 dose at age 20 (or older) if you have never been vaccinated.   Tetanus, diphtheria, pertussis (Tdap, Td). Get 1 dose of Tdap vaccine if you are younger than age 67, are over 80 and have contact with an infant, are a Research scientist (physical sciences), are pregnant, or simply want to be protected from whooping cough. After that, you need a Td booster dose every 10 years. Consult your caregiver if you have not had at least 3 tetanus and diphtheria-containing shots sometime in your life or have a deep or dirty wound.   HPV. You need this vaccine if you are a woman age 57 or younger. The vaccine is given in 3 doses over 6 months.   Measles, mumps, rubella (MMR). You need at least 1 dose of MMR if you were born in 1957 or later. You may also need a second dose.   Meningococcal. If you are age 60 to 75 and a first-year college student living in a residence hall, or have one of several medical conditions, you need to get vaccinated against meningococcal disease. You may also need additional booster doses.   Zoster (shingles). If you are age 50 or older, you should get this vaccine.   Varicella (chickenpox). If you  have never had chickenpox or you were vaccinated but received only 1 dose, talk to your caregiver to find out if you need this vaccine.   Hepatitis A. You need this vaccine if you have a specific risk factor for hepatitis A virus infection or you simply wish to be protected from this disease. The vaccine is usually given as 2 doses, 6 to 18 months apart.   Hepatitis B. You need this vaccine if you have a specific risk factor for hepatitis B virus infection or you simply wish to be protected from this disease. The vaccine is given in 3 doses, usually over 6 months.  Preventive Services / Frequency Ages 26 to 62  Blood pressure check.** / Every 1 to 2 years.   Lipid and cholesterol check.** / Every 5 years beginning at age 77.   Clinical breast exam.** / Every 3 years for women in their 34s and 30s.   Pap test.** / Every 2 years from ages 83 through 86. Every 3 years starting at age 65 through age 25 or 71 with a history of 3 consecutive normal Pap tests.   HPV screening.** / Every 3 years from ages 25 through ages 36 to 71 with a history of 3 consecutive normal Pap tests.   Hepatitis C blood test.** / For any individual with known risks for hepatitis C.   Skin self-exam. / Monthly.   Influenza immunization.** / Every year.   Pneumococcal polysaccharide immunization.** / 1 to 2 doses if you smoke cigarettes or if you have certain chronic medical conditions.   Tetanus, diphtheria, pertussis (Tdap, Td) immunization. / A one-time dose of Tdap vaccine. After that, you need a Td booster dose every 10 years.   HPV immunization. / 3 doses over 6 months, if you are 50 and younger.   Measles, mumps, rubella (MMR) immunization. / You need at least 1 dose of MMR if  you were born in [birth year] or later. You may also need a second dose.   Meningococcal immunization. / 1 dose if you are age 26 to 87 and a first-year college student living in a residence hall, or have one of several medical conditions, you  need to get vaccinated against meningococcal disease. You may also need additional booster doses.   Varicella immunization.** / Consult your caregiver.   Hepatitis A immunization.** / Consult your caregiver. 2 doses, 6 to 18 months apart.   Hepatitis B immunization.** / Consult your caregiver. 3 doses usually over 6 months.  Ages 57 to 23  Blood pressure check.** / Every 1 to 2 years.   Lipid and cholesterol check.** / Every 5 years beginning at age 53.   Clinical breast exam.** / Every year after age 53.   Mammogram.** / Every year beginning at age 26 and continuing for as long as you are in good health. Consult with your caregiver.   Pap test.** / Every 3 years starting at age 8 through age 35 or 60 with a history of 3 consecutive normal Pap tests.   HPV screening.** / Every 3 years from ages 7 through ages 4 to 14 with a history of 3 consecutive normal Pap tests.   Fecal occult blood test (FOBT) of stool. / Every year beginning at age 30 and continuing until age 34. You may not need to do this test if you get a colonoscopy every 10 years.   Flexible sigmoidoscopy or colonoscopy.** / Every 5 years for a flexible sigmoidoscopy or every 10 years for a colonoscopy beginning at age 43 and continuing until age 51.   Hepatitis C blood test.** / For all people born from [birth year] through 1963 and any individual with known risks for hepatitis C.   Skin self-exam. / Monthly.   Influenza immunization.** / Every year.   Pneumococcal polysaccharide immunization.** / 1 to 2 doses if you smoke cigarettes or if you have certain chronic medical conditions.   Tetanus, diphtheria, pertussis (Tdap, Td) immunization.** / A one-time dose of Tdap vaccine. After that, you need a Td booster dose every 10 years.   Measles, mumps, rubella (MMR) immunization. / You need at least 1 dose of MMR if you were born in 1957 or later. You may also need a second dose.   Varicella immunization.** / Consult your  caregiver.   Meningococcal immunization.** / Consult your caregiver.   Hepatitis A immunization.** / Consult your caregiver. 2 doses, 6 to 18 months apart.   Hepatitis B immunization.** / Consult your caregiver. 3 doses, usually over 6 months.  Ages 36 and over  Blood pressure check.** / Every 1 to 2 years.   Lipid and cholesterol check.** / Every 5 years beginning at age 54.   Clinical breast exam.** / Every year after age 48.   Mammogram.** / Every year beginning at age 68 and continuing for as long as you are in good health. Consult with your caregiver.   Pap test.** / Every 3 years starting at age 70 through age 10 or 7 with a 3 consecutive normal Pap tests. Testing can be stopped between 65 and 70 with 3 consecutive normal Pap tests and no abnormal Pap or HPV tests in the past 10 years.   HPV screening.** / Every 3 years from ages 69 through ages 40 or 33 with a history of 3 consecutive normal Pap tests. Testing can be stopped between 65 and 70 with 3 consecutive normal Pap  tests and no abnormal Pap or HPV tests in the past 10 years.   Fecal occult blood test (FOBT) of stool. / Every year beginning at age 19 and continuing until age 65. You may not need to do this test if you get a colonoscopy every 10 years.   Flexible sigmoidoscopy or colonoscopy.** / Every 5 years for a flexible sigmoidoscopy or every 10 years for a colonoscopy beginning at age 57 and continuing until age 42.   Hepatitis C blood test.** / For all people born from [birth year] through 1963 and any individual with known risks for hepatitis C.   Osteoporosis screening.** / A one-time screening for women ages 54 and over and women at risk for fractures or osteoporosis.   Skin self-exam. / Monthly.   Influenza immunization.** / Every year.   Pneumococcal polysaccharide immunization.** / 1 dose at age 9 (or older) if you have never been vaccinated.   Tetanus, diphtheria, pertussis (Tdap, Td) immunization. / A one-time  dose of Tdap vaccine if you are over 65 and have contact with an infant, are a Research scientist (physical sciences), or simply want to be protected from whooping cough. After that, you need a Td booster dose every 10 years.   Varicella immunization.** / Consult your caregiver.   Meningococcal immunization.** / Consult your caregiver.   Hepatitis A immunization.** / Consult your caregiver. 2 doses, 6 to 18 months apart.   Hepatitis B immunization.** / Check with your caregiver. 3 doses, usually over 6 months.  ** Family history and personal history of risk and conditions may change your caregiver's recommendations. Document Released: 07/30/2001 Document Revised: 05/23/2011 Document Reviewed: 10/29/2010 [facility] Patient Information 2012 [place], [place].

## 2011-10-15 ENCOUNTER — Ambulatory Visit (INDEPENDENT_AMBULATORY_CARE_PROVIDER_SITE_OTHER): Payer: Medicaid Other | Admitting: Nurse Practitioner

## 2011-10-15 ENCOUNTER — Encounter: Payer: Self-pay | Admitting: Nurse Practitioner

## 2011-10-15 VITALS — BP 121/74 | HR 74 | Ht 61.0 in | Wt 153.4 lb

## 2011-10-15 DIAGNOSIS — Z309 Encounter for contraceptive management, unspecified: Secondary | ICD-10-CM

## 2011-10-15 DIAGNOSIS — Z30017 Encounter for initial prescription of implantable subdermal contraceptive: Secondary | ICD-10-CM

## 2011-10-15 DIAGNOSIS — Z01812 Encounter for preprocedural laboratory examination: Secondary | ICD-10-CM

## 2011-10-15 MED ORDER — ETONOGESTREL 68 MG ~~LOC~~ IMPL: 68.0000 mg | DRUG_IMPLANT | Freq: Once | SUBCUTANEOUS | Status: DC

## 2011-10-15 NOTE — Progress Notes (Signed)
S: Desires Implanon insertion. On menses now. Has not resumed intercourse after birth. Has been given and read brochure and is aware of possible side effects. Consent signed.  Lab: UPT Negative Lot # [id number] Exp: Aug 2014  Procedure: Skin of left inner arm cleaned with betadine, marked and injected with 3 cc Lidocaine. Implanon inserted without any difficulty. Pt and provider able to feel implant. Area cleaned and bandaged.  A: Contraception Management  P: Implanon inserted Instructed to use condoms for 2 weeks Instructed on signs of infection Advised to avoid heavy lifting for 2 days Advised to call if any problems

## 2011-10-15 NOTE — Patient Instructions (Signed)
Contraception Choices Birth control (contraception) can stop pregnancy from happening. Different types of birth control work in different ways. Some can:  Make the mucus in the cervix thick. This makes it hard for sperm to get into the uterus.   Thin the lining of the uterus. This makes it hard for an egg to attach to the wall of the uterus.   Stop the ovaries from releasing an egg.   Block the sperm from reaching the egg.  Certain types of surgery can stop pregnancy from happening. For women, the sugery closes the fallopian tubes (tubal ligation). For men, the surgery stops sperm from releasing during sex (vasectomy). HORMONAL BIRTH CONTROL Hormonal birth control stops pregnancy by putting hormones into your body. Types of birth control include:  A small tube put under the skin of the upper arm (implant). The tube can stay in place for 3 years.   Shots given every 3 months.   Pills taken every day or once after sex (intercourse).   Patches that are changed once a week.   A ring put into the vagina (vaginal ring). The ring is left in place for 3 weeks and removed for 1 week. Then, a new ring is put in the vagina.  BARRIER BIRTH CONTROL  Barrier birth control blocks sperm from reaching the egg. Types of birth control include:   A thin covering worn on the penis (female condom) during sex.   A soft, loose covering put into the vagina (female condom) before sex.   A rubber bowl that sits over the cervix (diaphragm). The bowl must be made for you. The bowl is put into the vagina before sex. The bowl is left in place for 6 to 8 hours after sex.   A small, soft cup that fits over the cervix (cervical cap). The cup must be made for you. The cup can be left in place for 48 hours after sex.   A sponge that is put into the vagina before sex.   A chemical that kills or blocks sperm from getting into the cervix and uterus (spermicide). The chemical may be a cream, jelly, foam, or pill.    INTRAUTERINE (IUD) BIRTH CONTROL  IUD birth control is a small, T-shaped piece of plastic. The plastic is put inside the uterus. There are 2 types of IUD:  Copper IUD. The IUD is covered in copper wire. The copper makes a fluid that kills sperm. It can stay in place for 10 years.   Hormone IUD. The hormone stops pregnancy from happening. It can stay in place for 5 years.  NATURAL FAMILY PLANNING BIRTH CONTROL  Natural family planning means not having sex or using barrier birth control when the woman is fertile. A woman can:  Use a calendar to keep track of when she is fertile.   Use a thermometer to measure her body temperature.  Protect yourself against sexual diseases no matter what type of birth control you use. Talk to your doctor about which type of birth control is best for you. Document Released: 03/31/2009 Document Revised: 05/23/2011 Document Reviewed: 10/10/2010 [employer] Patient Information 2012 [employer]. 

## 2011-11-12 ENCOUNTER — Ambulatory Visit (INDEPENDENT_AMBULATORY_CARE_PROVIDER_SITE_OTHER): Payer: Medicaid Other | Admitting: Nurse Practitioner

## 2011-11-12 ENCOUNTER — Encounter: Payer: Self-pay | Admitting: Obstetrics & Gynecology

## 2011-11-12 VITALS — BP 105/67 | HR 72 | Ht 61.0 in | Wt 157.4 lb

## 2011-11-12 DIAGNOSIS — L600 Ingrowing nail: Secondary | ICD-10-CM

## 2011-11-12 DIAGNOSIS — IMO0001 Reserved for inherently not codable concepts without codable children: Secondary | ICD-10-CM

## 2011-11-12 DIAGNOSIS — Z3046 Encounter for surveillance of implantable subdermal contraceptive: Secondary | ICD-10-CM

## 2011-11-12 NOTE — Patient Instructions (Signed)
Infected Ingrown Toenail  An infected ingrown toenail occurs when the nail edge grows into the skin and bacteria invade the area. Symptoms include pain, tenderness, swelling, and pus drainage from the edge of the nail. Poorly fitting shoes, minor injuries, and improper cutting of the toenail may also contribute to the problem. You should cut your toenails squarely instead of rounding the edges. Do not cut them too short. Avoid tight or pointed toe shoes. Sometimes the ingrown portion of the nail must be removed. If your toenail is removed, it can take 3-4 months for it to re-grow.  HOME CARE INSTRUCTIONS     Soak your infected toe in warm water for 20-30 minutes, 2 to 3 times a day.    Packing or dressings applied to the area should be changed daily.    Take medicine as directed and finish them.    Reduce activities and keep your foot elevated when able to reduce swelling and discomfort. Do this until the infection gets better.    Wear sandals or go Maureen Perez as much as possible while the infected area is sensitive.    See your caregiver for follow-up care in 2-3 days if the infection is not better.   SEEK MEDICAL CARE IF:    Your toe is becoming more red, swollen or painful.  MAKE SURE YOU:     Understand these instructions.    Will watch your condition.    Will get help right away if you are not doing well or get worse.   Document Released: 07/11/2004 Document Revised: 05/23/2011 Document Reviewed: 05/30/2008  Exit[employer] Patient Information 2012 [employer].

## 2011-11-12 NOTE — Progress Notes (Signed)
S: Pt returns today for check on Implanon. She has done very well with this. No menses. Slight tenderness if she moves in a certain way otherwise no discomfort. She also c/o possible ingrown toenail on left great toe  O: Implanon site healed and nontender Left great toe: Tender/ small amount swelling and no redness. Feet are dirty.  A: contraception Infected left great toe  P: Implanon doing well, keep yearly visit See Podiatrist, discussed wearing open toed shoes, keeping feet clean, hot soaks

## 2011-12-05 ENCOUNTER — Encounter (HOSPITAL_COMMUNITY): Payer: Self-pay

## 2011-12-05 ENCOUNTER — Emergency Department (INDEPENDENT_AMBULATORY_CARE_PROVIDER_SITE_OTHER)
Admission: EM | Admit: 2011-12-05 | Discharge: 2011-12-05 | Disposition: A | Discharge status: Home / Self Care | Payer: Self-pay | Source: Home / Self Care | Attending: Emergency Medicine | Admitting: Emergency Medicine

## 2011-12-05 DIAGNOSIS — S46909A Unspecified injury of unspecified muscle, fascia and tendon at shoulder and upper arm level, unspecified arm, initial encounter: Secondary | ICD-10-CM

## 2011-12-05 DIAGNOSIS — S4980XA Other specified injuries of shoulder and upper arm, unspecified arm, initial encounter: Secondary | ICD-10-CM

## 2011-12-05 DIAGNOSIS — S46009A Unspecified injury of muscle(s) and tendon(s) of the rotator cuff of unspecified shoulder, initial encounter: Secondary | ICD-10-CM

## 2011-12-05 HISTORY — DX: Unspecified injury of muscle(s) and tendon(s) of the rotator cuff of left shoulder, initial encounter: S46.002A

## 2011-12-05 MED ORDER — HYDROCODONE-ACETAMINOPHEN 5-325 MG PO TABS: 2.0000 | ORAL_TABLET | ORAL | Status: AC | PRN

## 2011-12-05 MED ORDER — MELOXICAM 15 MG PO TABS: 15.0000 mg | ORAL_TABLET | Freq: Every day | ORAL | Status: DC

## 2011-12-05 NOTE — Discharge Instructions (Signed)
Start with 5 repetitions of the exercises and work your way up to 10-15.

## 2011-12-05 NOTE — ED Notes (Signed)
C/o lt shoulder pain for 4 weeks.  States she ran into a sliding glass door and injured it 4 weeks ago.  Reports injury to rotator cuff when 26 years old.  Also concerned she is having abdominal cramps and headaches since having implanon placed.

## 2011-12-05 NOTE — ED Provider Notes (Signed)
History     CSN: [id number]  Arrival date & time 12/05/11  1501   First MD Initiated Contact with Patient 12/05/11 1555      Chief Complaint  Patient presents with  . Shoulder Pain    (Consider location/radiation/quality/duration/timing/severity/associated sxs/prior treatment) HPI Comments: Patient reports hitting the back of her left shoulder on a glass door about 4 weeks ago. Since then reports sharp and achy, nonradiating shoulder pain, it is worse with certain movements such as picking up heavy objects, raising her arms, and sleeping on her left shoulder. States that it is waking her up at night. No nausea, vomiting, bruising, numbness, tingling. Has a history of a left rotator cuff injury, but patient never followed up with orthopedics.    ROS as noted in HPI. All other ROS negative.   Patient is a 26 y.o. female presenting with shoulder injury. The history is provided by the patient. No language interpreter was used.  Shoulder Injury This is a new problem. The current episode started more than 1 week ago. The problem occurs constantly. The problem has not changed since onset.Exacerbated by: use. Nothing relieves the symptoms. She has tried nothing for the symptoms.    Past Medical History  Diagnosis Date  . Back pain   . Obesity   . Depression     no meds since pregancny  . Abnormal Pap smear     repeat WNL  . PONV (postoperative nausea and vomiting)   . Injury of tendon of left rotator cuff     Past Surgical History  Procedure Date  . Tubes in ears age 73  . Tonsillectomy     age 34    Family History  Problem Relation Age of Onset  . Depression Mother   . Diabetes Mother   . Hypertension Mother   . COPD Mother   . Anesthesia problems Mother   . Depression Sister   . Diabetes Sister   . Kidney disease Sister   . Hypertension Sister   . Hypertension Father   . Anesthesia problems Father     History  Substance Use Topics  . Smoking status: Current  Everyday Smoker -- 0.2 packs/day for 6 years    Types: Cigarettes  . Smokeless tobacco: Never Used  . Alcohol Use: No    OB History    Grav Para Term Preterm Abortions TAB SAB Ect Mult Living   2 1 1  0 1 0 1 0 0 1      Review of Systems  Allergies  Cephalexin  Home Medications   Current Outpatient Rx  Name Route Sig Dispense Refill  . DESLORATADINE 5 MG PO TABS Oral Take 1 tablet (5 mg total) by mouth daily. 30 tablet 2  . ETONOGESTREL 68 MG Megargel IMPL Subcutaneous Inject 1 each (68 mg total) into the skin once. 1 each 0  . PAROXETINE HCL 20 MG PO TABS Oral Take 1 tablet (20 mg total) by mouth every morning. 30 tablet 3  . ACETAMINOPHEN 500 MG PO TABS Oral Take 500 mg by mouth every 6 (six) hours as needed. For pain    . HYDROCODONE-ACETAMINOPHEN 5-325 MG PO TABS Oral Take 2 tablets by mouth every 4 (four) hours as needed for pain. 20 tablet 0  . MELOXICAM 15 MG PO TABS Oral Take 1 tablet (15 mg total) by mouth daily. 14 tablet 0  . PRENATAL MULTIVITAMIN CH Oral Take 1 tablet by mouth every morning.       BP  115/87  Pulse 93  Temp 98.2 F (36.8 C) (Oral)  Resp 16  SpO2 100%  LMP 11/18/2011  Breastfeeding? Unknown  Physical Exam  Nursing note and vitals reviewed. Constitutional: She is oriented to person, place, and time. She appears well-developed and well-nourished. No distress.  HENT:  Head: Normocephalic and atraumatic.  Eyes: Conjunctivae and EOM are normal.  Neck: Normal range of motion.  Cardiovascular: Normal rate.   Pulmonary/Chest: Effort normal.  Abdominal: She exhibits no distension.  Musculoskeletal: Normal range of motion.       left shoulder with ROM normal, Drop test  painful but negative,  clavicle NT, A/C joint NT, scapula NTr, proximal humerus NT, shoulder joint  tender, Motor strength normal, Sensation intact LT over deltoid region, distal NVI with hand on affected side having intact sensation and strength in the distribution of the median, radial,  and ulnar nerve. No tenderness in bicipital groove, positive empty can test, positive liftoff test,no instability with abduction/external rotation.  Neurological: She is alert and oriented to person, place, and time.  Skin: Skin is warm and dry.  Psychiatric: She has a normal mood and affect. Her behavior is normal. Judgment and thought content normal.    ED Course  Procedures (including critical care time)  Labs Reviewed - No data to display No results found.   1. Rotator cuff injury     MDM  Previous chart is reviewed. She was seen by her NP on 5/28, continue doing all the time. Patient has opted to address her birth control issues with her NP. No evidence of instability, fracture. Deferring imaging. Will have her sleep with a pillow underneath her arm at night, sent home with NSAIDs and Norco, physical therapy exercises. Referring her to the [facility], or to Dr. Ave Filter, orthopedic surgeon on call.    Luiz Blare, MD 12/05/11 [phone]

## 2012-02-16 ENCOUNTER — Encounter (HOSPITAL_COMMUNITY): Payer: Self-pay | Admitting: Emergency Medicine

## 2012-02-16 DIAGNOSIS — R1084 Generalized abdominal pain: Secondary | ICD-10-CM | POA: Insufficient documentation

## 2012-02-16 DIAGNOSIS — R110 Nausea: Secondary | ICD-10-CM | POA: Insufficient documentation

## 2012-02-16 LAB — POCT PREGNANCY, URINE: Preg Test, Ur: NEGATIVE

## 2012-02-16 LAB — BASIC METABOLIC PANEL
Calcium: 9.5 mg/dL (ref 8.4–10.5)
Chloride: 104 mEq/L (ref 96–112)
Creatinine, Ser: 0.87 mg/dL (ref 0.50–1.10)
GFR calc Af Amer: >90 mL/min (ref >90)
Glucose, Bld: 107 mg/dL — ABNORMAL HIGH (ref 70–99)
Potassium: 4.2 mEq/L (ref 3.5–5.1)

## 2012-02-16 LAB — URINALYSIS, ROUTINE W REFLEX MICROSCOPIC
Glucose, UA: NEGATIVE mg/dL
Ketones, ur: NEGATIVE mg/dL
pH: 7 (ref 5.0–8.0)

## 2012-02-16 LAB — CBC
HCT: 40 % (ref 36.0–46.0)
Hemoglobin: 13.4 g/dL (ref 12.0–15.0)
MCHC: 33.5 g/dL (ref 30.0–36.0)

## 2012-02-16 LAB — URINE MICROSCOPIC-ADD ON

## 2012-02-16 NOTE — ED Notes (Signed)
C/o generalized abd pain, nausea, and back pain since Friday.  Denies urinary complaints.

## 2012-02-17 ENCOUNTER — Emergency Department (HOSPITAL_COMMUNITY)
Admission: EM | Admit: 2012-02-17 | Discharge: 2012-02-17 | Discharge status: Against Medical Advice | Payer: Self-pay | Attending: Emergency Medicine | Admitting: Emergency Medicine

## 2012-02-17 NOTE — ED Notes (Signed)
Called pt to update vitals. No answer  

## 2012-02-18 ENCOUNTER — Emergency Department (HOSPITAL_COMMUNITY)
Admission: EM | Admit: 2012-02-18 | Discharge: 2012-02-18 | Discharge status: Absent without leave | Payer: Self-pay | Source: Home / Self Care

## 2012-02-19 ENCOUNTER — Other Ambulatory Visit (INDEPENDENT_AMBULATORY_CARE_PROVIDER_SITE_OTHER): Payer: Self-pay | Admitting: Gynecology

## 2012-02-19 DIAGNOSIS — N912 Amenorrhea, unspecified: Secondary | ICD-10-CM

## 2012-02-19 NOTE — Progress Notes (Signed)
Patient came in today requesting a pregnancy test done. Per patient LMP was the first week of august. Patient also has the Nexplanon insertion. Patient concern was not feeling well and need to  Be tested.

## 2012-02-19 NOTE — Addendum Note (Signed)
Addended by: Vinnie Langton C on: 02/19/2012 11:30 AM   Modules accepted: Level of Service

## 2012-04-29 ENCOUNTER — Ambulatory Visit (INDEPENDENT_AMBULATORY_CARE_PROVIDER_SITE_OTHER): Payer: Self-pay | Admitting: Family Medicine

## 2012-04-29 VITALS — BP 122/79 | HR 96 | Temp 98.5°F | Ht 61.0 in | Wt 158.0 lb

## 2012-04-29 DIAGNOSIS — K047 Periapical abscess without sinus: Secondary | ICD-10-CM

## 2012-04-29 MED ORDER — HYDROCODONE-ACETAMINOPHEN 5-325 MG PO TABS: 1.0000 | ORAL_TABLET | Freq: Four times a day (QID) | ORAL | Status: DC | PRN

## 2012-04-29 MED ORDER — PENICILLIN V POTASSIUM 500 MG PO TABS: 500.0000 mg | ORAL_TABLET | Freq: Four times a day (QID) | ORAL | Status: DC

## 2012-04-29 NOTE — Assessment & Plan Note (Signed)
Abscess of lateral incisor on R.  Has intolerance to keflex but states that she has used PCN before without problem.  Will treat with 10 day course of PCN and a few days of vicodin for pain control.  She states that she does have a dentist that she will follow up with.

## 2012-04-29 NOTE — Progress Notes (Signed)
  Subjective:    Patient ID: Maureen Perez, female    DOB: 04-Jun-1986, 26 y.o.   MRN: [id number]  HPI 1. Dental pain:  Patient here with complaint of tooth and gum pain for the past 3-4 days.  She states that she has a broken tooth that gum around area has become more tender and slightly swollen. No pus or drainage seen. She has not noticed any bleeding with brushing her teeth and denies fever or jaw swelling.  She does have a dentist in [place].     Review of Systems Per HPI    Objective:   Physical Exam  Constitutional: She appears well-nourished. No distress.  HENT:  Head: Normocephalic and atraumatic.       Broken lateral incisor on R with some associated decay. There is some swelling of the gum with some purulent discharge around base of the tooth. No bleeding seen, no jaw swelling.   Lymphadenopathy:    She has no cervical adenopathy.  Neurological: She is alert.          Assessment & Plan:

## 2012-04-29 NOTE — Patient Instructions (Addendum)
Please make an appointment with your dentist.  Abscessed Tooth A tooth abscess is a collection of infected fluid (pus) from a bacterial infection in the inner part of the tooth (pulp). It usually occurs at the end of the tooth's root.  CAUSES   A very bad cavity (extensive tooth decay).   Trauma to the tooth, such as a broken or chipped tooth, that allows bacteria to enter into the pulp.  SYMPTOMS  Severe pain in and around the infected tooth.   Swelling and redness around the abscessed tooth or in the mouth or face.   Tenderness.   Pus drainage.   Bad breath.   Bitter taste in the mouth.   Difficulty swallowing.   Difficulty opening the mouth.   Feeling sick to your stomach (nauseous).   Vomiting.   Chills.   Swollen neck glands.  DIAGNOSIS  A medical and dental history will be taken.   An examination will be performed by tapping on the abscessed tooth.   X-rays may be taken of the tooth to identify the abscess.  TREATMENT The goal of treatment is to eliminate the infection.   You may be prescribed antibiotic medicine to stop the infection from spreading.   A root canal may be performed to save the tooth. If the tooth cannot be saved, it may be pulled (extracted) and the abscess may be drained.  HOME CARE INSTRUCTIONS  Only take over-the-counter or prescription medicines for pain, fever, or discomfort as directed by your caregiver.   Do not drive after taking pain medicine (narcotics).   Rinse your mouth (gargle) often with salt water ( tsp salt in 8 oz of warm water) to relieve pain or swelling.   Do not apply heat to the outside of your face.   Return to your dentist for further treatment as directed.  SEEK IMMEDIATE DENTAL CARE IF:  You have a temperature by mouth above 102 F (38.9 C), not controlled by medicine.   You have chills or a very bad headache.   You have problems breathing or swallowing.   Your have trouble opening your mouth.    You develop swelling in the neck or around the eye.   Your pain is not helped by medicine.   Your pain is getting worse instead of better.  Document Released: 06/03/2005 Document Revised: 05/23/2011 Document Reviewed: 09/11/2010 [facility] Patient Information 2012 [place], [place].

## 2012-06-23 ENCOUNTER — Telehealth: Payer: Self-pay | Admitting: *Deleted

## 2012-06-23 NOTE — Telephone Encounter (Signed)
[facility] Pharmacist calling.  States patient brought in a Rx for Norco that was written on 04/29/2012.  Checking to see if Dr. Ashley Royalty is okay with them still filling this prescription since it is almost 2 months old.  Per office note this Rx was given for a tooth abscess and patient was to follow up with her Dentist.  Spoke with Dr. Ashley Royalty and he feels if she didn't need it then, she shouldn't need it now.  Pharmacist notified to cancel this prescription.  Too old to fill per Dr. Ashley Royalty.  Ileana Ladd

## 2012-09-28 ENCOUNTER — Other Ambulatory Visit (INDEPENDENT_AMBULATORY_CARE_PROVIDER_SITE_OTHER): Payer: Self-pay | Admitting: *Deleted

## 2012-09-28 DIAGNOSIS — N912 Amenorrhea, unspecified: Secondary | ICD-10-CM

## 2012-09-28 NOTE — Progress Notes (Signed)
She has been having nausea, breast tenderness, and fatigue and she missed her cycle last month.  She does have the implanon placed in her arm so she is concerned at these symptoms.

## 2012-09-28 NOTE — Progress Notes (Signed)
Patient would like hcg level bloodwork done.  The last time she was pregnant it did not show in her urine and she was at [redacted] weeks gestation.

## 2012-10-09 ENCOUNTER — Inpatient Hospital Stay (HOSPITAL_COMMUNITY)
Admission: AD | Admit: 2012-10-09 | Discharge: 2012-10-09 | Disposition: A | Discharge status: Home / Self Care | Payer: Self-pay | Source: Ambulatory Visit | Attending: Obstetrics & Gynecology | Admitting: Obstetrics & Gynecology

## 2012-10-09 ENCOUNTER — Telehealth: Payer: Self-pay | Admitting: Emergency Medicine

## 2012-10-09 ENCOUNTER — Encounter (HOSPITAL_COMMUNITY): Payer: Self-pay | Admitting: Advanced Practice Midwife

## 2012-10-09 DIAGNOSIS — Z3202 Encounter for pregnancy test, result negative: Secondary | ICD-10-CM | POA: Insufficient documentation

## 2012-10-09 NOTE — MAU Note (Signed)
Patient states she has been having pregnancy symptoms, some nausea, feeling tired. Denies pain, bleeding or discharge.

## 2012-10-09 NOTE — MAU Note (Signed)
Patient states she had a implant put in in 2013. Has had a negative pregnancy test about 1-2 weeks ago at home.

## 2012-10-09 NOTE — MAU Provider Note (Signed)
History     CSN: [id number]  Arrival date and time: 10/09/12 1048   None     Chief Complaint  Patient presents with  . Possible Pregnancy   HPI 27 y.o. G2P1011 here requesting pregnancy test. Reports nausea x 2 weeks, fatigue and lightheadedness. No pain or bleeding. States she doesn't have + UPT when pregnant. Has implanon in x 1 year. Was seen at [facility] on 4/14 with same c/o and had negative UPT and serum pregnancy test.    Past Medical History  Diagnosis Date  . Back pain   . Obesity   . Depression     no meds since pregancny  . Abnormal Pap smear     repeat WNL  . PONV (postoperative nausea and vomiting)   . Injury of tendon of left rotator cuff     Past Surgical History  Procedure Laterality Date  . Tubes in ears  age 42  . Tonsillectomy      age 34    Family History  Problem Relation Age of Onset  . Depression Mother   . Diabetes Mother   . Hypertension Mother   . COPD Mother   . Anesthesia problems Mother   . Depression Sister   . Diabetes Sister   . Kidney disease Sister   . Hypertension Sister   . Hypertension Father   . Anesthesia problems Father     History  Substance Use Topics  . Smoking status: Current Every Day Smoker -- 0.25 packs/day for 6 years    Types: Cigarettes  . Smokeless tobacco: Never Used  . Alcohol Use: No    Allergies:  Allergies  Allergen Reactions  . Cephalexin Itching and Nausea And Vomiting    Prescriptions prior to admission  Medication Sig Dispense Refill  . desloratadine (CLARINEX) 5 MG tablet Take 1 tablet (5 mg total) by mouth daily.  30 tablet  2  . etonogestrel (IMPLANON) 68 MG IMPL implant Inject 1 each (68 mg total) into the skin once.  1 each  0  . HYDROcodone-acetaminophen (NORCO/VICODIN) 5-325 MG per tablet Take 1 tablet by mouth every 6 (six) hours as needed for pain.  15 tablet  0  . meloxicam (MOBIC) 15 MG tablet Take 1 tablet (15 mg total) by mouth daily.  14 tablet  0  . penicillin v  potassium (VEETID) 500 MG tablet Take 1 tablet (500 mg total) by mouth 4 (four) times daily.  40 tablet  0    Review of Systems  Constitutional: Positive for malaise/fatigue. Negative for fever and chills.  Respiratory: Negative.   Cardiovascular: Negative.   Gastrointestinal: Positive for nausea. Negative for vomiting, abdominal pain, diarrhea and constipation.  Genitourinary: Negative for dysuria, urgency, frequency, hematuria and flank pain.       Negative for vaginal bleeding, vaginal discharge  Musculoskeletal: Negative.   Neurological: Positive for dizziness.  Psychiatric/Behavioral: Negative.    Physical Exam   Blood pressure 124/85, pulse 77, temperature 98.1 F (36.7 C), temperature source Oral, resp. rate 16, height 5' 1.5" (1.562 m), weight 162 lb (73.483 kg), SpO2 100.00%.  Physical Exam  Nursing note and vitals reviewed. Constitutional: She is oriented to person, place, and time. She appears well-developed and well-nourished.  Cardiovascular: Normal rate.   Respiratory: Effort normal.  Musculoskeletal: Normal range of motion.  Neurological: She is alert and oriented to person, place, and time.  Skin: Skin is warm and dry.  Psychiatric: She has a normal mood and affect.  MAU Course  Procedures Results for orders placed during the hospital encounter of 10/09/12 (from the past 24 hour(s))  POCT PREGNANCY, URINE     Status: None   Collection Time    10/09/12 11:09 AM      Result Value Range   Preg Test, Ur NEGATIVE  NEGATIVE  HCG, SERUM, QUALITATIVE     Status: None   Collection Time    10/09/12 11:30 AM      Result Value Range   Preg, Serum NEGATIVE  NEGATIVE   Serum pregnancy test ordered per patient request  Assessment and Plan   1. Negative pregnancy test   If nausea continues, f/u with PCP     Medication List    TAKE these medications       desloratadine 5 MG tablet  Commonly known as:  CLARINEX  Take 1 tablet (5 mg total) by mouth daily.      etonogestrel 68 MG Impl implant  Commonly known as:  IMPLANON  Inject 1 each (68 mg total) into the skin once.     HYDROcodone-acetaminophen 5-325 MG per tablet  Commonly known as:  NORCO/VICODIN  Take 1 tablet by mouth every 6 (six) hours as needed for pain.     meloxicam 15 MG tablet  Commonly known as:  MOBIC  Take 1 tablet (15 mg total) by mouth daily.     penicillin v potassium 500 MG tablet  Commonly known as:  VEETID  Take 1 tablet (500 mg total) by mouth 4 (four) times daily.            Follow-up Information   Follow up with BOOTH, ERIN, MD. (if nausea/vomiting continues)    Contact information:   [address] [place] [address] [phone]         [facility] 10/09/2012, 12:40 PM

## 2012-10-09 NOTE — MAU Provider Note (Signed)
Attestation of Attending Supervision of Advanced Practitioner (CNM/NP): Evaluation and management procedures were performed by the Advanced Practitioner under my supervision and collaboration.  I have reviewed the Advanced Practitioner's note and chart, and I agree with the management and plan.  HARRAWAY-SMITH, Borna Wessinger 1:41 PM     

## 2012-10-09 NOTE — Telephone Encounter (Signed)
Patient needs to speak to someone about her implant hurting her arm and rolling around in her arm.

## 2012-10-09 NOTE — Telephone Encounter (Signed)
Called pt. She reports, that she has had the implanon for over one year and never had problems before. Now she feels like her arm is getting numb for about 4 weeks. I advised the pt to schedule an OV to be examined. Pt agreed. Lorenda Hatchet, Renato Battles

## 2012-10-13 ENCOUNTER — Encounter: Payer: Self-pay | Admitting: Emergency Medicine

## 2012-10-13 ENCOUNTER — Ambulatory Visit (INDEPENDENT_AMBULATORY_CARE_PROVIDER_SITE_OTHER): Payer: Self-pay | Admitting: Emergency Medicine

## 2012-10-13 VITALS — BP 116/82 | HR 91 | Temp 97.8°F | Ht 61.5 in | Wt 161.9 lb

## 2012-10-13 DIAGNOSIS — Z309 Encounter for contraceptive management, unspecified: Secondary | ICD-10-CM

## 2012-10-13 NOTE — Assessment & Plan Note (Addendum)
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  Strength and sensation intact to the left hand.  Blood flow is good as well.  Will get an ultrasound to evaluate location of the implant in relation to the neurovascular bundle.  Will need to remove the implant, but may need surgical consult depending on location.  Discussed with patient who is agreeable.  Follow up after the ultrasound.

## 2012-10-13 NOTE — Progress Notes (Signed)
  Subjective:    Patient ID: Maureen Perez, female    DOB: November 29, 1985, 27 y.o.   MRN: [id number]  HPI Maureen Perez is here for birth control problem.  She had an implanon placed 09/2011.  She did not have any problems with it until 2 months ago.  At that time she started having some lower arm numbness/tingling when she palpated the implant.  Numbness is present only with palpation of the implant and resolves within minutes.  Denies any trauma to the arm.  2 weeks ago, she started having difficulty palpating the implant and when she finds it, it feels different.  I have reviewed and updated the following as appropriate: allergies and current medications SHx: current smoker  Review of Systems See HPI    Objective:   Physical Exam BP 116/82  Pulse 91  Temp(Src) 97.8 F (36.6 C) (Oral)  Ht 5' 1.5" (1.562 m)  Wt 161 lb 14.4 oz (73.437 kg)  BMI 30.1 kg/m2  LMP 08/23/2012  Breastfeeding? No Left arm: Implant palpated 4cm proximal to medial epicondyle, feels deep; palpation of implant reproduces patient numbness, however sensation to light tough and strength remain intact; Allen's test normal.     Assessment & Plan:

## 2012-10-20 ENCOUNTER — Ambulatory Visit (INDEPENDENT_AMBULATORY_CARE_PROVIDER_SITE_OTHER): Payer: Self-pay | Admitting: Obstetrics and Gynecology

## 2012-10-20 ENCOUNTER — Encounter: Payer: Self-pay | Admitting: Obstetrics and Gynecology

## 2012-10-20 VITALS — BP 111/80 | HR 77 | Ht 61.0 in | Wt 159.0 lb

## 2012-10-20 DIAGNOSIS — Z3009 Encounter for other general counseling and advice on contraception: Secondary | ICD-10-CM

## 2012-10-20 DIAGNOSIS — Z3046 Encounter for surveillance of implantable subdermal contraceptive: Secondary | ICD-10-CM

## 2012-10-20 MED ORDER — NORGESTIMATE-ETH ESTRADIOL 0.25-35 MG-MCG PO TABS: 1.0000 | ORAL_TABLET | Freq: Every day | ORAL | Status: DC

## 2012-10-20 NOTE — Progress Notes (Signed)
Patient ID: Maureen Perez, female   DOB: 16-Jun-1986, 27 y.o.   MRN: [id number] 27 yo G2P1011 s/p Implanon placement in 09/2011 presenting today for removal. Patient reports onset of numbness in arm approximately 2 months ago. Numbness in hand  is reproduced when palpating Implanon which is located 4 cm from medial epicondyl. Patient plans to use OCP for birth control. She has used them in the past without any issues.  Removal Patient given informed consent for removal of her Implanon, time out was performed.  Signed copy in the chart.  Appropriate time out taken. Implanon site identified.  Area prepped in usual sterile fashon. One cc of 1% lidocaine was used to anesthetize the area at the distal end of the implant. A small stab incision was made right beside the implant on the distal portion.  The implanon rod was grasped using hemostats and removed without difficulty.  There was less than 3 cc blood loss. There were no complications.  A small amount of antibiotic ointment and steri-strips were applied over the small incision.  A pressure bandage was applied to reduce any bruising.  The patient tolerated the procedure well and was given post procedure instructions.  Rx Sprintec provided. Patient advised to use back up form of birth control for 2 weeks.

## 2012-10-20 NOTE — Progress Notes (Signed)
Here today to have Nexplanon removed, she has numbness when she presses on the site.  Concerned that the placement is not correct.  Otherwise is happy with the device.

## 2012-10-29 ENCOUNTER — Ambulatory Visit: Payer: Self-pay | Admitting: Emergency Medicine

## 2012-12-08 ENCOUNTER — Inpatient Hospital Stay (HOSPITAL_COMMUNITY)
Admission: AD | Admit: 2012-12-08 | Discharge: 2012-12-09 | Disposition: A | Discharge status: Home / Self Care | Payer: Self-pay | Source: Ambulatory Visit | Attending: Obstetrics & Gynecology | Admitting: Obstetrics & Gynecology

## 2012-12-08 DIAGNOSIS — O3680X0 Pregnancy with inconclusive fetal viability, not applicable or unspecified: Secondary | ICD-10-CM

## 2012-12-08 DIAGNOSIS — O26899 Other specified pregnancy related conditions, unspecified trimester: Secondary | ICD-10-CM

## 2012-12-08 DIAGNOSIS — O210 Mild hyperemesis gravidarum: Secondary | ICD-10-CM | POA: Insufficient documentation

## 2012-12-08 DIAGNOSIS — R109 Unspecified abdominal pain: Secondary | ICD-10-CM | POA: Insufficient documentation

## 2012-12-09 ENCOUNTER — Encounter (HOSPITAL_COMMUNITY): Payer: Self-pay | Admitting: *Deleted

## 2012-12-09 ENCOUNTER — Inpatient Hospital Stay (HOSPITAL_COMMUNITY): Payer: Self-pay

## 2012-12-09 DIAGNOSIS — O9989 Other specified diseases and conditions complicating pregnancy, childbirth and the puerperium: Secondary | ICD-10-CM

## 2012-12-09 DIAGNOSIS — R109 Unspecified abdominal pain: Secondary | ICD-10-CM

## 2012-12-09 LAB — URINALYSIS, ROUTINE W REFLEX MICROSCOPIC
Glucose, UA: NEGATIVE mg/dL
Hgb urine dipstick: NEGATIVE
Leukocytes, UA: NEGATIVE
Protein, ur: NEGATIVE mg/dL
Specific Gravity, Urine: >1.03 — ABNORMAL HIGH (ref 1.005–1.030)
pH: 5.5 (ref 5.0–8.0)

## 2012-12-09 LAB — WET PREP, GENITAL
Clue Cells Wet Prep HPF POC: NONE SEEN
Trich, Wet Prep: NONE SEEN

## 2012-12-09 LAB — HCG, QUANTITATIVE, PREGNANCY: hCG, Beta Chain, Quant, S: 245 m[IU]/mL — ABNORMAL HIGH (ref <5)

## 2012-12-09 LAB — CBC
Hemoglobin: 12.4 g/dL (ref 12.0–15.0)
MCH: 29.8 pg (ref 26.0–34.0)
Platelets: 239 10*3/uL (ref 150–400)
RBC: 4.16 MIL/uL (ref 3.87–5.11)
WBC: 10.8 10*3/uL — ABNORMAL HIGH (ref 4.0–10.5)

## 2012-12-09 LAB — POCT PREGNANCY, URINE: Preg Test, Ur: POSITIVE — AB

## 2012-12-09 MED ORDER — PROMETHAZINE HCL 25 MG PO TABS: 25.0000 mg | ORAL_TABLET | Freq: Four times a day (QID) | ORAL | Status: DC | PRN

## 2012-12-09 NOTE — MAU Provider Note (Signed)
Chief Complaint: Possible Pregnancy  First Provider Initiated Contact with Patient 12/09/12 0118     SUBJECTIVE HPI: Maureen Perez is a 27 y.o. G3P1011 at [redacted]w[redacted]d by LMP who presents with positive home pregnancy test today, nausea and cramping times a few days. Denies fever, chills, vaginal bleeding, vaginal discharge, vomiting, diarrhea, constipation, urinary complaints. Has not had an testing this pregnancy. Nexplanon removed in April. LMP 10/15/2012.  Past Medical History  Diagnosis Date  . Back pain   . Obesity   . Depression     no meds since pregancny  . Abnormal Pap smear     repeat WNL  . PONV (postoperative nausea and vomiting)   . Injury of tendon of left rotator cuff    OB History   Grav Para Term Preterm Abortions TAB SAB Ect Mult Living   3 1 1  0 1 0 1 0 0 1     # Outc Date GA Lbr Len/2nd Wgt Sex Del Anes PTL Lv   1 TRM 3/13 [redacted]w[redacted]d 21:43 / 01:13 4.098JX([id number]) F SVD EPI  Yes   Comments: No anomalies noted   2 CUR            3 SAB  [redacted]w[redacted]d            Past Surgical History  Procedure Laterality Date  . Tubes in ears  age 62  . Tonsillectomy      age 50   History   Social History  . Marital Status: Single    Spouse Name: N/A    Number of Children: N/A  . Years of Education: N/A   Occupational History  . Not on file.   Social History Main Topics  . Smoking status: Current Every Day Smoker -- 0.25 packs/day for 6 years    Types: Cigarettes  . Smokeless tobacco: Never Used  . Alcohol Use: No  . Drug Use: No  . Sexually Active: Yes    Birth Control/ Protection: Implant   Other Topics Concern  . Not on file   Social History Narrative  . No narrative on file   No current facility-administered medications on file prior to encounter.   Current Outpatient Prescriptions on File Prior to Encounter  Medication Sig Dispense Refill  . desloratadine (CLARINEX) 5 MG tablet Take 1 tablet (5 mg total) by mouth daily.  30 tablet  2  . promethazine (PHENERGAN)  12.5 MG tablet Take 2 tablets (25 mg total) by mouth every 6 (six) hours as needed for nausea.  30 tablet  0  . promethazine (PHENERGAN) 12.5 MG tablet Take 1 tablet (12.5 mg total) by mouth every 6 (six) hours as needed for nausea.  30 tablet  0   Allergies  Allergen Reactions  . Cephalexin Itching and Nausea And Vomiting    ROS: Pertinent items in HPI  OBJECTIVE Blood pressure 117/68, pulse 87, temperature 98.7 F (37.1 C), temperature source Oral, resp. rate 20, height 5\' 1"  (1.549 m), weight 72.632 kg (160 lb 2 oz), last menstrual period 10/15/2012. GENERAL: Well-developed, well-nourished female in no acute distress.  HEENT: Normocephalic HEART: normal rate RESP: normal effort ABDOMEN: Soft, obese, non-tender. No masses. EXTREMITIES: Nontender, no edema NEURO: Alert and oriented SPECULUM EXAM: NEFG, physiologic discharge, no blood noted, cervix clean BIMANUAL: cervix closed; uterus normal size, no adnexal tenderness or masses. No CMT.  LAB RESULTS Results for orders placed during the hospital encounter of 12/08/12 (from the past 24 hour(s))  URINALYSIS, ROUTINE W REFLEX MICROSCOPIC  Status: Abnormal   Collection Time    12/09/12 12:14 AM      Result Value Range   Color, Urine YELLOW  YELLOW   APPearance CLEAR  CLEAR   Specific Gravity, Urine >1.030 (*) 1.005 - 1.030   pH 5.5  5.0 - 8.0   Glucose, UA NEGATIVE  NEGATIVE mg/dL   Hgb urine dipstick NEGATIVE  NEGATIVE   Bilirubin Urine NEGATIVE  NEGATIVE   Ketones, ur NEGATIVE  NEGATIVE mg/dL   Protein, ur NEGATIVE  NEGATIVE mg/dL   Urobilinogen, UA 1.0  0.0 - 1.0 mg/dL   Nitrite NEGATIVE  NEGATIVE   Leukocytes, UA NEGATIVE  NEGATIVE  POCT PREGNANCY, URINE     Status: Abnormal   Collection Time    12/09/12  1:12 AM      Result Value Range   Preg Test, Ur POSITIVE (*) NEGATIVE  HCG, QUANTITATIVE, PREGNANCY     Status: Abnormal   Collection Time    12/09/12  1:30 AM      Result Value Range   hCG, Beta Chain, Quant, S  245 (*) <5 mIU/mL  CBC     Status: Abnormal   Collection Time    12/09/12  1:30 AM      Result Value Range   WBC 10.8 (*) 4.0 - 10.5 K/uL   RBC 4.16  3.87 - 5.11 MIL/uL   Hemoglobin 12.4  12.0 - 15.0 g/dL   HCT 60.4  54.0 - 98.1 %   MCV 87.0  78.0 - 100.0 fL   MCH 29.8  26.0 - 34.0 pg   MCHC 34.3  30.0 - 36.0 g/dL   RDW 19.1  47.8 - 29.5 %   Platelets 239  150 - 400 K/uL    IMAGING US Ob Comp Less 14 Wks  12/09/2012   *RADIOLOGY REPORT*  Clinical Data: low abd pain, 7.6 by LMP. First cycle after Nexplanon  removed.,  OBSTETRIC <14 WK [place] AND TRANSVAGINAL OB US  Technique: Both transabdominal and transvaginal ultrasound examinations were performed for complete evaluation of the gestation as well as the maternal uterus, adnexal regions, and pelvic cul-de-sac.  Comparison: None.  Findings: No intrauterine gestational sac visualized.  Endometrium thickened and homogeneous.  No focal uterine abnormality.  Ovaries are symmetric in size and echotexture.  Right corpus luteal cyst.  No free fluid.  IMPRESSION: No intrauterine gestation visualized.  No adnexal masses.   Original Report Authenticated By: Charlett Nose, [name]   US Ob Transvaginal  12/09/2012   *RADIOLOGY REPORT*  Clinical Data: low abd pain, 7.6 by LMP. First cycle after Nexplanon  removed.,  OBSTETRIC <14 WK [place] AND TRANSVAGINAL OB US  Technique: Both transabdominal and transvaginal ultrasound examinations were performed for complete evaluation of the gestation as well as the maternal uterus, adnexal regions, and pelvic cul-de-sac.  Comparison: None.  Findings: No intrauterine gestational sac visualized.  Endometrium thickened and homogeneous.  No focal uterine abnormality.  Ovaries are symmetric in size and echotexture.  Right corpus luteal cyst.  No free fluid.  IMPRESSION: No intrauterine gestation visualized.  No adnexal masses.   Original Report Authenticated By: Charlett Nose, [name]    MAU COURSE  ASSESSMENT 1. Abdominal pain  complicating pregnancy, antepartum   2. Pregnancy, location unknown     PLAN Discharge home in stable condition. SAB ectopic precautions. Wet prep, GC Chlamydia pending     Follow-up Information   Follow up with THE [facility] OF [place] MATERNITY ADMISSIONS In 2 days. (for  blood work or as needed if symptoms worsen)    Contact information:   [address] [id number] [place] [address] [phone]       Medication List    STOP taking these medications       etonogestrel 68 MG Impl implant  Commonly known as:  IMPLANON     norgestimate-ethinyl estradiol 0.25-35 MG-MCG tablet  Commonly known as:  ORTHO-CYCLEN,SPRINTEC,PREVIFEM      TAKE these medications       desloratadine 5 MG tablet  Commonly known as:  CLARINEX  Take 1 tablet (5 mg total) by mouth daily.     promethazine 25 MG tablet  Commonly known as:  PHENERGAN  Take 1 tablet (25 mg total) by mouth every 6 (six) hours as needed for nausea.       [place], [place] 12/09/2012  3:37 AM

## 2012-12-09 NOTE — MAU Note (Signed)
Pt states she has been feeling nauseous and has had positive pregnancy test. Pt states she is feeling cramping also

## 2012-12-09 NOTE — MAU Note (Signed)
PT SAYS  SHE GOES TO [facility] -  HAD IMPLANT  FOR BIRTH CONTROL- THEY REMOVED IT     April.    LMP WAS 5-1.   SHE DID HPT TODAY- POSITIVE.   NAUSEATED X2 WEEKS. -       NO VOMITING.

## 2012-12-10 NOTE — MAU Provider Note (Signed)
Attestation of Attending Supervision of Advanced Practitioner (PA/CNM/NP): Evaluation and management procedures were performed by the Advanced Practitioner under my supervision and collaboration.  I have reviewed the Advanced Practitioner's note and chart, and I agree with the management and plan.  Karmon Andis, MD, FACOG Attending Obstetrician & Gynecologist Faculty Practice, [facility]  

## 2012-12-11 ENCOUNTER — Inpatient Hospital Stay (HOSPITAL_COMMUNITY)
Admission: AD | Admit: 2012-12-11 | Discharge: 2012-12-11 | Disposition: A | Discharge status: Home / Self Care | Payer: Self-pay | Source: Ambulatory Visit | Attending: Obstetrics & Gynecology | Admitting: Obstetrics & Gynecology

## 2012-12-11 DIAGNOSIS — O9989 Other specified diseases and conditions complicating pregnancy, childbirth and the puerperium: Secondary | ICD-10-CM

## 2012-12-11 DIAGNOSIS — R109 Unspecified abdominal pain: Secondary | ICD-10-CM | POA: Insufficient documentation

## 2012-12-11 DIAGNOSIS — O99891 Other specified diseases and conditions complicating pregnancy: Secondary | ICD-10-CM | POA: Insufficient documentation

## 2012-12-11 DIAGNOSIS — O26899 Other specified pregnancy related conditions, unspecified trimester: Secondary | ICD-10-CM

## 2012-12-11 LAB — GC/CHLAMYDIA PROBE AMP
CT Probe RNA: NEGATIVE
GC Probe RNA: NEGATIVE

## 2012-12-11 NOTE — MAU Provider Note (Signed)
Maureen Perez is a 27 y.o. G3P1011 at [redacted]w[redacted]d here for follow up quant HCG. Vaginal bleeding: none. Abdominal pain: none.   Prior HCGs: 6/25: 245  Prior U/S: 6/25: no IUGS, no adnexal mass  Past Medical History  Diagnosis Date  . Back pain   . Obesity   . Depression     no meds since pregancny  . Abnormal Pap smear     repeat WNL  . PONV (postoperative nausea and vomiting)   . Injury of tendon of left rotator cuff     Prescriptions prior to admission  Medication Sig Dispense Refill  . desloratadine (CLARINEX) 5 MG tablet Take 1 tablet (5 mg total) by mouth daily.  30 tablet  2  . promethazine (PHENERGAN) 12.5 MG tablet Take 2 tablets (25 mg total) by mouth every 6 (six) hours as needed for nausea.  30 tablet  0  . promethazine (PHENERGAN) 12.5 MG tablet Take 1 tablet (12.5 mg total) by mouth every 6 (six) hours as needed for nausea.  30 tablet  0  . promethazine (PHENERGAN) 25 MG tablet Take 1 tablet (25 mg total) by mouth every 6 (six) hours as needed for nausea.  30 tablet  1    Allergies  Allergen Reactions  . Cephalexin Itching and Nausea And Vomiting    Review of Systems - general and GYN negative  Objective BP 103/64  Pulse 79  Temp(Src) 97.6 F (36.4 C) (Oral)  Resp 16  Ht 5\' 1"  (1.549 m)  Wt 155 lb 4 oz (70.421 kg)  BMI 29.35 kg/m2  SpO2 100%  LMP 10/15/2012  Breastfeeding? No  General: Alert, oriented, no acute distress  Results for orders placed during the hospital encounter of 12/11/12 (from the past 24 hour(s))  HCG, QUANTITATIVE, PREGNANCY     Status: Abnormal   Collection Time    12/11/12  8:10 AM      Result Value Range   hCG, Beta Chain, Quant, S 779 (*) <5 mIU/mL    Assessment  1. Abdominal pain in pregnancy, antepartum     Plan F/U on 7/7 for repeat u/s for viability, precautions rev'd    Medication List    TAKE these medications       desloratadine 5 MG tablet  Commonly known as:  CLARINEX  Take 1 tablet (5 mg total) by mouth  daily.     promethazine 12.5 MG tablet  Commonly known as:  PHENERGAN  Take 2 tablets (25 mg total) by mouth every 6 (six) hours as needed for nausea.     promethazine 12.5 MG tablet  Commonly known as:  PHENERGAN  Take 1 tablet (12.5 mg total) by mouth every 6 (six) hours as needed for nausea.     promethazine 25 MG tablet  Commonly known as:  PHENERGAN  Take 1 tablet (25 mg total) by mouth every 6 (six) hours as needed for nausea.        Follow-up Information   Follow up with THE [facility] OF [place] ULTRASOUND On 12/21/2012. (Arrive at 11:00)    Contact information:   [address] [id number] [place] [address] [phone]      Follow up with THE [facility] OF [place] MATERNITY ADMISSIONS. (following ultrasound for results)    Contact information:   [address] [id number] [place] [address] [phone]      Andie Mortimer 9:03 AM @TODAY @

## 2012-12-11 NOTE — MAU Note (Signed)
Pt here for repeat bhcg, denies pain or bleeding.

## 2012-12-11 NOTE — MAU Provider Note (Signed)
Attestation of Attending Supervision of Advanced Practitioner (PA/CNM/NP): Evaluation and management procedures were performed by the Advanced Practitioner under my supervision and collaboration.  I have reviewed the Advanced Practitioner's note and chart, and I agree with the management and plan.  Kieanna Rollo, MD, FACOG Attending Obstetrician & Gynecologist Faculty Practice, [facility]  

## 2012-12-12 ENCOUNTER — Inpatient Hospital Stay (HOSPITAL_COMMUNITY)
Admission: AD | Admit: 2012-12-12 | Discharge: 2012-12-12 | Disposition: A | Discharge status: Home / Self Care | Payer: Self-pay | Source: Ambulatory Visit | Attending: Obstetrics and Gynecology | Admitting: Obstetrics and Gynecology

## 2012-12-12 ENCOUNTER — Encounter (HOSPITAL_COMMUNITY): Payer: Self-pay

## 2012-12-12 DIAGNOSIS — O99891 Other specified diseases and conditions complicating pregnancy: Secondary | ICD-10-CM | POA: Insufficient documentation

## 2012-12-12 DIAGNOSIS — M545 Low back pain, unspecified: Secondary | ICD-10-CM | POA: Insufficient documentation

## 2012-12-12 DIAGNOSIS — R300 Dysuria: Secondary | ICD-10-CM | POA: Insufficient documentation

## 2012-12-12 LAB — URINALYSIS, ROUTINE W REFLEX MICROSCOPIC
Glucose, UA: NEGATIVE mg/dL
Hgb urine dipstick: NEGATIVE
Ketones, ur: NEGATIVE mg/dL
Leukocytes, UA: NEGATIVE
pH: 6 (ref 5.0–8.0)

## 2012-12-12 NOTE — MAU Provider Note (Signed)
  History     CSN: [id number]  Arrival date and time: 12/12/12 2001   First Provider Initiated Contact with Patient 12/12/12 2041      Chief Complaint  Patient presents with  . Dysuria   HPI Maureen Perez is a 27 y.o. G3P101. She has had MAU visits to evaluate early pregnancy. 6/25 BHCG was 245, on 6/27 was 77. She is to return 7/7 for U/S. Her c/o today is burning with the start of voidnig today. No frequency, urgency or hematuria. No abd pain or cramping. She c/o low ML back ache off/on since yesterday.   OB History   Grav Para Term Preterm Abortions TAB SAB Ect Mult Living   3 1 1  0 1 0 1 0 0 1      Past Medical History  Diagnosis Date  . Back pain   . Obesity   . Depression     no meds since pregancny  . Abnormal Pap smear     repeat WNL  . PONV (postoperative nausea and vomiting)   . Injury of tendon of left rotator cuff     Past Surgical History  Procedure Laterality Date  . Tubes in ears  age 16  . Tonsillectomy      age 14    Family History  Problem Relation Age of Onset  . Depression Mother   . Diabetes Mother   . Hypertension Mother   . COPD Mother   . Anesthesia problems Mother   . Depression Sister   . Diabetes Sister   . Kidney disease Sister   . Hypertension Sister   . Hypertension Father   . Anesthesia problems Father     History  Substance Use Topics  . Smoking status: Former Smoker -- 0.25 packs/day for 6 years    Types: Cigarettes  . Smokeless tobacco: Former Neurosurgeon    Quit date: 12/10/2012  . Alcohol Use: No    Allergies:  Allergies  Allergen Reactions  . Cephalexin Itching and Nausea And Vomiting    Prescriptions prior to admission  Medication Sig Dispense Refill  . desloratadine (CLARINEX) 5 MG tablet Take 1 tablet (5 mg total) by mouth daily.  30 tablet  2  . promethazine (PHENERGAN) 25 MG tablet Take 1 tablet (25 mg total) by mouth every 6 (six) hours as needed for nausea.  30 tablet  1    Review of Systems   Constitutional: Negative for fever and chills.  Gastrointestinal: Negative for abdominal pain.  Genitourinary: Positive for dysuria. Negative for urgency, frequency, hematuria and flank pain.   Physical Exam   Blood pressure 114/57, pulse 77, temperature 98.7 F (37.1 C), temperature source Oral, resp. rate 16, last menstrual period 10/15/2012.  Physical Exam  Constitutional: She is oriented to person, place, and time. She appears well-developed and well-nourished.  GI: Soft. There is no tenderness.  Genitourinary:  Pelvic- Vulva- nl anatomy, skin intact, scant white mucoid discharge  Musculoskeletal: Normal range of motion.  Neurological: She is alert and oriented to person, place, and time.  Skin: Skin is warm and dry.  Psychiatric: She has a normal mood and affect. Her behavior is normal.    MAU Course  Procedures  MDM  Assessment and Plan  ASSESSMENT: Dysuria  Early pregnancy, no UTI  PLAN: Increase fluids, decrease dark colored fluids, increase cran drinks  Return if symptoms increase Return 7/7 for U/S or sooner with changes  Junetta Hearn M. 12/12/2012, 8:59 PM

## 2012-12-12 NOTE — MAU Note (Signed)
Burning with urination & intermittent low back pain since yesterday. Denies abdominal pain/vaginal bleeding/vaginal discharge.

## 2012-12-13 NOTE — MAU Provider Note (Signed)
Attestation of Attending Supervision of Advanced Practitioner (CNM/NP): Evaluation and management procedures were performed by the Advanced Practitioner under my supervision and collaboration.  I have reviewed the Advanced Practitioner's note and chart, and I agree with the management and plan.  Ty Buntrock 12/13/2012 6:20 AM

## 2012-12-19 ENCOUNTER — Inpatient Hospital Stay (HOSPITAL_COMMUNITY): Payer: Medicaid Other

## 2012-12-19 ENCOUNTER — Inpatient Hospital Stay (HOSPITAL_COMMUNITY)
Admission: AD | Admit: 2012-12-19 | Discharge: 2012-12-19 | Disposition: A | Discharge status: Home / Self Care | Payer: Medicaid Other | Source: Ambulatory Visit | Attending: Obstetrics & Gynecology | Admitting: Obstetrics & Gynecology

## 2012-12-19 ENCOUNTER — Encounter (HOSPITAL_COMMUNITY): Payer: Self-pay

## 2012-12-19 DIAGNOSIS — Y92410 Unspecified street and highway as the place of occurrence of the external cause: Secondary | ICD-10-CM | POA: Insufficient documentation

## 2012-12-19 DIAGNOSIS — O99891 Other specified diseases and conditions complicating pregnancy: Secondary | ICD-10-CM | POA: Insufficient documentation

## 2012-12-19 DIAGNOSIS — R109 Unspecified abdominal pain: Secondary | ICD-10-CM | POA: Insufficient documentation

## 2012-12-19 DIAGNOSIS — Z3490 Encounter for supervision of normal pregnancy, unspecified, unspecified trimester: Secondary | ICD-10-CM

## 2012-12-19 DIAGNOSIS — W19XXXA Unspecified fall, initial encounter: Secondary | ICD-10-CM

## 2012-12-19 DIAGNOSIS — M549 Dorsalgia, unspecified: Secondary | ICD-10-CM | POA: Insufficient documentation

## 2012-12-19 DIAGNOSIS — W1809XA Striking against other object with subsequent fall, initial encounter: Secondary | ICD-10-CM | POA: Insufficient documentation

## 2012-12-19 LAB — URINALYSIS, ROUTINE W REFLEX MICROSCOPIC
Glucose, UA: NEGATIVE mg/dL
Leukocytes, UA: NEGATIVE
Nitrite: NEGATIVE
Specific Gravity, Urine: >1.03 — ABNORMAL HIGH (ref 1.005–1.030)
pH: 6 (ref 5.0–8.0)

## 2012-12-19 MED ORDER — OXYCODONE-ACETAMINOPHEN 5-325 MG PO TABS: 1.0000 | ORAL_TABLET | Freq: Four times a day (QID) | ORAL | Status: DC | PRN
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
## 2012-12-19 NOTE — MAU Note (Signed)
Pain is on left side of back
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
## 2012-12-20 NOTE — MAU Provider Note (Signed)
Attestation of Attending Supervision of Advanced Practitioner (CNM/NP): Evaluation and management procedures were performed by the Advanced Practitioner under my supervision and collaboration. I have reviewed the Advanced Practitioner's note and chart, and I agree with the management and plan.  Harvis Mabus H. 10:47 AM

## 2012-12-21 ENCOUNTER — Ambulatory Visit (HOSPITAL_COMMUNITY): Payer: Self-pay

## 2012-12-23 ENCOUNTER — Encounter: Payer: Self-pay | Admitting: *Deleted

## 2012-12-23 ENCOUNTER — Ambulatory Visit (INDEPENDENT_AMBULATORY_CARE_PROVIDER_SITE_OTHER): Payer: Medicaid Other | Admitting: *Deleted

## 2012-12-23 VITALS — BP 125/83 | Wt 161.0 lb

## 2012-12-23 DIAGNOSIS — Z139 Encounter for screening, unspecified: Secondary | ICD-10-CM

## 2012-12-23 DIAGNOSIS — Z3480 Encounter for supervision of other normal pregnancy, unspecified trimester: Secondary | ICD-10-CM

## 2012-12-23 NOTE — Progress Notes (Signed)
P = 77 

## 2012-12-24 ENCOUNTER — Encounter: Payer: Self-pay | Admitting: Family Medicine

## 2012-12-24 DIAGNOSIS — Z283 Underimmunization status: Secondary | ICD-10-CM | POA: Insufficient documentation

## 2012-12-24 DIAGNOSIS — O9989 Other specified diseases and conditions complicating pregnancy, childbirth and the puerperium: Secondary | ICD-10-CM

## 2012-12-24 LAB — OBSTETRIC PANEL
Basophils Relative: 0 % (ref 0–1)
Hemoglobin: 12.7 g/dL (ref 12.0–15.0)
Hepatitis B Surface Ag: NEGATIVE
Lymphs Abs: 2.6 10*3/uL (ref 0.7–4.0)
Monocytes Relative: 6 % (ref 3–12)
Neutro Abs: 7.7 10*3/uL (ref 1.7–7.7)
Neutrophils Relative %: 68 % (ref 43–77)
RBC: 4.36 MIL/uL (ref 3.87–5.11)
Rh Type: POSITIVE
WBC: 11.2 10*3/uL — ABNORMAL HIGH (ref 4.0–10.5)

## 2012-12-24 LAB — GC/CHLAMYDIA PROBE AMP: CT Probe RNA: NEGATIVE

## 2012-12-25 LAB — CULTURE, OB URINE

## 2013-01-12 ENCOUNTER — Ambulatory Visit (INDEPENDENT_AMBULATORY_CARE_PROVIDER_SITE_OTHER): Payer: Medicaid Other | Admitting: Obstetrics & Gynecology

## 2013-01-12 ENCOUNTER — Encounter: Payer: Self-pay | Admitting: Obstetrics & Gynecology

## 2013-01-12 ENCOUNTER — Other Ambulatory Visit: Payer: Self-pay | Admitting: Obstetrics & Gynecology

## 2013-01-12 ENCOUNTER — Other Ambulatory Visit (HOSPITAL_COMMUNITY)
Admission: RE | Admit: 2013-01-12 | Discharge: 2013-01-12 | Disposition: A | Discharge status: Home / Self Care | Payer: Medicaid Other | Source: Ambulatory Visit | Attending: Obstetrics & Gynecology | Admitting: Obstetrics & Gynecology

## 2013-01-12 VITALS — BP 125/71 | Wt 155.0 lb

## 2013-01-12 DIAGNOSIS — Z113 Encounter for screening for infections with a predominantly sexual mode of transmission: Secondary | ICD-10-CM | POA: Insufficient documentation

## 2013-01-12 DIAGNOSIS — Z3490 Encounter for supervision of normal pregnancy, unspecified, unspecified trimester: Secondary | ICD-10-CM

## 2013-01-12 DIAGNOSIS — Z01419 Encounter for gynecological examination (general) (routine) without abnormal findings: Secondary | ICD-10-CM | POA: Insufficient documentation

## 2013-01-12 DIAGNOSIS — Z283 Underimmunization status: Secondary | ICD-10-CM

## 2013-01-12 DIAGNOSIS — O99334 Smoking (tobacco) complicating childbirth: Secondary | ICD-10-CM

## 2013-01-12 DIAGNOSIS — O99331 Smoking (tobacco) complicating pregnancy, first trimester: Secondary | ICD-10-CM

## 2013-01-12 DIAGNOSIS — Z3682 Encounter for antenatal screening for nuchal translucency: Secondary | ICD-10-CM

## 2013-01-12 DIAGNOSIS — Z3401 Encounter for supervision of normal first pregnancy, first trimester: Secondary | ICD-10-CM

## 2013-01-12 DIAGNOSIS — O9989 Other specified diseases and conditions complicating pregnancy, childbirth and the puerperium: Secondary | ICD-10-CM

## 2013-01-12 DIAGNOSIS — Z3400 Encounter for supervision of normal first pregnancy, unspecified trimester: Secondary | ICD-10-CM

## 2013-01-12 DIAGNOSIS — F17200 Nicotine dependence, unspecified, uncomplicated: Secondary | ICD-10-CM

## 2013-01-12 DIAGNOSIS — O99330 Smoking (tobacco) complicating pregnancy, unspecified trimester: Secondary | ICD-10-CM | POA: Insufficient documentation

## 2013-01-12 NOTE — Progress Notes (Signed)
Subjective:    Maureen Perez is a G3P1011 at [redacted]w[redacted]d being seen today for her first obstetrical visit.  Her obstetrical history is significant for term SVD. Patient does intend to breast feed. Pregnancy history fully reviewed.  Patient reports no complaints.  Filed Vitals:   01/12/13 1503  BP: 125/71  Weight: 155 lb (70.308 kg)    HISTORY: OB History   Grav Para Term Preterm Abortions TAB SAB Ect Mult Living   3 1 1  0 1 0 1 0 0 1     # Outc Date GA Lbr Len/2nd Wgt Sex Del Anes PTL Lv   1 TRM 3/13 [redacted]w[redacted]d 21:43 / 01:13 6lb2.9oz(2.805kg) F SVD EPI  Yes   Comments: No anomalies noted   2 SAB  [redacted]w[redacted]d          3 CUR              Past Medical History  Diagnosis Date  . Back pain   . Obesity   . Depression     no meds since pregancny  . Abnormal Pap smear     repeat WNL  . PONV (postoperative nausea and vomiting)   . Injury of tendon of left rotator cuff    Past Surgical History  Procedure Laterality Date  . Tubes in ears  age 25  . Tonsillectomy      age 36   Family History  Problem Relation Age of Onset  . Depression Mother   . Diabetes Mother   . Hypertension Mother   . COPD Mother   . Anesthesia problems Mother   . Depression Sister   . Diabetes Sister   . Kidney disease Sister   . Hypertension Sister   . Hypertension Father   . Anesthesia problems Father     Exam    Uterus:     Pelvic Exam:    Perineum: No Hemorrhoids, Normal Perineum   Vulva: normal   Vagina:  normal mucosa, normal discharge   Cervix: multiparous appearance and no bleeding following Pap   Adnexa: normal adnexa and no mass, fullness, tenderness   Bony Pelvis: gynecoid and proven to 6 lbs 3 oz  System: Breast:  normal appearance, no masses or tenderness   Skin: normal coloration and turgor, no rashes   Neurologic: oriented, normal   Extremities: normal strength, tone, and muscle mass   HEENT PERRLA and extra ocular movement intact   Mouth/Teeth mucous membranes moist, pharynx  normal without lesions and dental hygiene poor   Neck supple and no masses   Cardiovascular: regular rate and rhythm   Respiratory:  appears well, vitals normal, no respiratory distress, acyanotic, normal RR, chest clear, no wheezing, crepitations, rhonchi, normal symmetric air entry   Abdomen: soft, non-tender; bowel sounds normal; no masses,  no organomegaly   Urinary: urethral meatus normal   Bedside ultrasound showed SIUP with cardiac activity; CRL measurement not consistent with LMP dating but consistent with 5 week MSD dating.  Follow up formal ultrasound dating.   Assessment:    Pregnancy: G3P1011 Patient Active Problem List   Diagnosis Date Noted  . Rubella non-immune status, antepartum 12/24/2012    Priority: High  . Smoking complicating pregnancy, antepartum 01/12/2013    Priority: Medium  . Supervision of normal first pregnancy 02/26/2011    Priority: Low  . Tooth abscess 04/29/2012  . Contraception management 10/15/2011  . LGSIL (low grade squamous intraepithelial lesion) on Pap smear 10/07/2011  . Lymphadenitis 09/11/2010  . DEPRESSION,  MODERATE, RECURRENT 03/20/2010  . 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 INSOMNIA 01/06/2009  . TOBACCO ABUSE 11/22/2008  . ALLERGIC RHINITIS DUE TO ANIMAL HAIR AND DANDER 11/22/2008  . OBESITY 10/20/2008     Plan:   Initial labs reviewed.  Patient is RNI, will need MMR postpartum. Continue prenatal vitamins. Problem list reviewed and updated. Patient is a smoker, discussed cutting down on smoking, nicotine replacement products.  Also discussed effects of nicotine in pregnancy. Genetic Screening discussed First Screen: ordered. Ultrasound discussed; fetal survey: discussed and will be ordered later. Follow up in 4 weeks.  Tereso Newcomer, MD 01/12/2013

## 2013-01-12 NOTE — Patient Instructions (Addendum)
Pregnancy - First Trimester During sexual intercourse, millions of sperm go into the vagina. Only 1 sperm will penetrate and fertilize the female egg while it is in the Fallopian tube. One week later, the fertilized egg implants into the wall of the uterus. An embryo begins to develop into a baby. At 6 to 8 weeks, the eyes and face are formed and the heartbeat can be seen on ultrasound. At the end of 12 weeks (first trimester), all the baby's organs are formed. Now that you are pregnant, you will want to do everything you can to have a healthy baby. Two of the most important things are to get good prenatal care and follow your caregiver's instructions. Prenatal care is all the medical care you receive before the baby's birth. It is given to prevent, find, and treat problems during the pregnancy and childbirth. PRENATAL EXAMS  During prenatal visits, your weight, blood pressure, and urine are checked. This is done to make sure you are healthy and progressing normally during the pregnancy.  A pregnant woman should gain 25 to 35 pounds during the pregnancy. However, if you are overweight or underweight, your caregiver will advise you regarding your weight.  Your caregiver will ask and answer questions for you.  Blood work, cervical cultures, other necessary tests, and a Pap test are done during your prenatal exams. These tests are done to check on your health and the probable health of your baby. Tests are strongly recommended and done for HIV with your permission. This is the virus that causes AIDS. These tests are done because medicines can be given to help prevent your baby from being born with this infection should you have been infected without knowing it. Blood work is also used to find out your blood type, previous infections, and follow your blood levels (hemoglobin).  Low hemoglobin (anemia) is common during pregnancy. Iron and vitamins are given to help prevent this. Later in the pregnancy,  blood tests for diabetes will be done along with any other tests if any problems develop.  You may need other tests to make sure you and the baby are doing well. CHANGES DURING THE FIRST TRIMESTER  Your body goes through many changes during pregnancy. They vary from person to person. Talk to your caregiver about changes you notice and are concerned about. Changes can include:  Your menstrual period stops.  The egg and sperm carry the genes that determine what you look like. Genes from you and your partner are forming a baby. The female genes determine whether the baby is a boy or a girl.  Your body increases in girth and you may feel bloated.  Feeling sick to your stomach (nauseous) and throwing up (vomiting). If the vomiting is uncontrollable, call your caregiver.  Your breasts will begin to enlarge and become tender.  Your nipples may stick out more and become darker.  The need to urinate more. Painful urination may mean you have a bladder infection.  Tiring easily.  Loss of appetite.  Cravings for certain kinds of food.  At first, you may gain or lose a couple of pounds.  You may have changes in your emotions from day to day (excited to be pregnant or concerned something may go wrong with the pregnancy and baby).  You may have more vivid and strange dreams. HOME CARE INSTRUCTIONS   It is very important to avoid all smoking, alcohol and non-prescribed drugs during your pregnancy. These affect the formation and growth of the baby.   Avoid chemicals while pregnant to ensure the delivery of a healthy infant.  Start your prenatal visits by the 12th week of pregnancy. They are usually scheduled monthly at first, then more often in the last 2 months before delivery. Keep your caregiver's appointments. Follow your caregiver's instructions regarding medicine use, blood and lab tests, exercise, and diet.  During pregnancy, you are providing food for you and your baby. Eat regular,  well-balanced meals. Choose foods such as meat, fish, milk and other low fat dairy products, vegetables, fruits, and whole-grain breads and cereals. Your caregiver will tell you of the ideal weight gain.  You can help morning sickness by keeping soda crackers at the bedside. Eat a couple before arising in the morning. You may want to use the crackers without salt on them.  Eating 4 to 5 small meals rather than 3 large meals a day also may help the nausea and vomiting.  Drinking liquids between meals instead of during meals also seems to help nausea and vomiting.  A physical sexual relationship may be continued throughout pregnancy if there are no other problems. Problems may be early (premature) leaking of amniotic fluid from the membranes, vaginal bleeding, or belly (abdominal) pain.  Exercise regularly if there are no restrictions. Check with your caregiver or physical therapist if you are unsure of the safety of some of your exercises. Greater weight gain will occur in the last 2 trimesters of pregnancy. Exercising will help:  Control your weight.  Keep you in shape.  Prepare you for labor and delivery.  Help you lose your pregnancy weight after you deliver your baby.  Wear a good support or jogging bra for breast tenderness during pregnancy. This may help if worn during sleep too.  Ask when prenatal classes are available. Begin classes when they are offered.  Do not use hot tubs, steam rooms, or saunas.  Wear your seat belt when driving. This protects you and your baby if you are in an accident.  Avoid raw meat, uncooked cheese, cat litter boxes, and soil used by cats throughout the pregnancy. These carry germs that can cause birth defects in the baby.  The first trimester is a good time to visit your dentist for your dental health. Getting your teeth cleaned is okay. Use a softer toothbrush and brush gently during pregnancy.  Ask for help if you have financial, counseling, or  nutritional needs during pregnancy. Your caregiver will be able to offer counseling for these needs as well as refer you for other special needs.  Do not take any medicines or herbs unless told by your caregiver.  Inform your caregiver if there is any mental or physical domestic violence.  Make a list of emergency phone numbers of family, friends, hospital, and police and fire departments.  Write down your questions. Take them to your prenatal visit.  Do not douche.  Do not cross your legs.  If you have to stand for long periods of time, rotate you feet or take small steps in a circle.  You may have more vaginal secretions that may require a sanitary pad. Do not use tampons or scented sanitary pads. MEDICINES AND DRUG USE IN PREGNANCY  Take prenatal vitamins as directed. The vitamin should contain 1 milligram of folic acid. Keep all vitamins out of reach of children. Only a couple vitamins or tablets containing iron may be fatal to a baby or young child when ingested.  Avoid use of all medicines, including herbs, over-the-counter medicines, not   prescribed or suggested by your caregiver. Only take over-the-counter or prescription medicines for pain, discomfort, or fever as directed by your caregiver. Do not use aspirin, ibuprofen, or naproxen unless directed by your caregiver.  Let your caregiver also know about herbs you may be using.  Alcohol is related to a number of birth defects. This includes fetal alcohol syndrome. All alcohol, in any form, should be avoided completely. Smoking will cause low birth rate and premature babies.  Street or illegal drugs are very harmful to the baby. They are absolutely forbidden. A baby born to an addicted mother will be addicted at birth. The baby will go through the same withdrawal an adult does.  Let your caregiver know about any medicines that you have to take and for what reason you take them. SEEK MEDICAL CARE IF:  You have any concerns or  worries during your pregnancy. It is better to call with your questions if you feel they cannot wait, rather than worry about them. SEEK IMMEDIATE MEDICAL CARE IF:   An unexplained oral temperature above 102 F (38.9 C) develops, or as your caregiver suggests.  You have leaking of fluid from the vagina (birth canal). If leaking membranes are suspected, take your temperature and inform your caregiver of this when you call.  There is vaginal spotting or bleeding. Notify your caregiver of the amount and how many pads are used.  You develop a bad smelling vaginal discharge with a change in the color.  You continue to feel sick to your stomach (nauseated) and have no relief from remedies suggested. You vomit blood or coffee ground-like materials.  You lose more than 2 pounds of weight in 1 week.  You gain more than 2 pounds of weight in 1 week and you notice swelling of your face, hands, feet, or legs.  You gain 5 pounds or more in 1 week (even if you do not have swelling of your hands, face, legs, or feet).  You get exposed to [place] measles and have never had them.  You are exposed to fifth disease or chickenpox.  You develop belly (abdominal) pain. Round ligament discomfort is a common non-cancerous (benign) cause of abdominal pain in pregnancy. Your caregiver still must evaluate this.  You develop headache, fever, diarrhea, pain with urination, or shortness of breath.  You fall or are in a car accident or have any kind of trauma.  There is mental or physical violence in your home. Document Released: 05/28/2001 Document Revised: 02/26/2012 Document Reviewed: 11/29/2008 [facility] Patient Information 2014 [place], [place]. Pregnancy and Smoking Smoking during pregnancy is very unhealthy for the mother and the developing fetus. The addictive drug in cigarettes (nicotine), carbon monoxide, and many other poisons are inhaled from a cigarette and are carried through your bloodstream to your  fetus. Cigarette smoke contains more than 2,500 chemicals. It is not known which of these chemicals are harmful to the developing fetus. However, both nicotine and carbon monoxide play a role in causing health problems in pregnancy. Effects on the fetus of smoking during pregnancy:  Decrease in blood flow and oxygen to the uterus, placenta, and your fetus.  Increased heart rate of the fetus.  Slowing of your fetus's growth in the uterus (intrauterine growth retardation).  Placental problems. Placenta may partially cover or completely cover the opening to the cervix (placenta previa), or the placenta may partially or completely separate from the uterus (placental abruption).  Increase risk of pregnancy outside of the uterus (tubal pregnancy).  Premature  rupture membranes, causing the sac that holds the fetus to break too early, resulting in premature birth and increased health risks to the newborn.  Increased risk of birth defects, including heart defects.  Increased risk of miscarriage. Newborns born to women who smoke during pregnancy:  Are more likely to be born too early (prematurely).  Are more likely to be at a low birth weight.  Are at risk for serious health problems, chronic or lifelong disabilities (cerebral palsy, mental retardation, learning problems), and possibly even death  Are at risk of Sudden Infant Death Syndrome (SIDS).  Have higher rates of miscarriage and stillbirth.  Have more lung and breathing (respiratory) problems. Long-term effects on a child's behavior: Some of the following trends are seen with children of smoking mothers:  Increased risk for drug abuse, behavior, and conduct disorders.  Increased risk for smoking in adolescent girls.  Increased risk for negative behavior in 2-year-olds.  Increase risk for asthma, colic, and childhood obesity, which can lead to diabetes.  Increased risk for finger and toe disorders. Resources to stop smoking  during pregnancy:  Counseling.  Psychological treatment.  Acupuncture.  Family intervention.  Hypnosis.  Medicines that are safe to take during pregnancy. Nicotine supplements have not been studied enough. They should only be considered when all other methods fail.  Telephone QUIT lines. Smoking and Breastfeeding:  Nicotine gets passed to the infant through a mother's breastmilk. This can cause nausea, colic, cramping, and diarrhea in the infant.  Smoking may reduce milk supply and interfere with the let-down response.  Even formula-fed infants of mothers who smoke have nicotine and cotinine (nicotine by-product) in their urine. Other resources to help stop smoking:  American Cancer Society: www.cancer.org  American Heart Association: www.americanheart.org  National Cancer Institute: www.cancer.gov  Smoke Free Families: www.smokefreefamilies.[address] [place] Line): (226)255-2200 START Document Released: 10/15/2004 Document Revised: 08/26/2011 Document Reviewed: 03/15/2009 [facility] Patient Information 2014 [place], [place].

## 2013-01-12 NOTE — Progress Notes (Signed)
P = 76 

## 2013-01-29 ENCOUNTER — Encounter (HOSPITAL_COMMUNITY): Payer: Self-pay | Admitting: Obstetrics & Gynecology

## 2013-02-04 ENCOUNTER — Ambulatory Visit (HOSPITAL_COMMUNITY)
Admission: RE | Admit: 2013-02-04 | Discharge: 2013-02-04 | Disposition: A | Discharge status: Home / Self Care | Payer: Medicaid Other | Source: Ambulatory Visit | Attending: Family Medicine | Admitting: Family Medicine

## 2013-02-04 ENCOUNTER — Other Ambulatory Visit (HOSPITAL_COMMUNITY): Payer: Medicaid Other

## 2013-02-04 ENCOUNTER — Inpatient Hospital Stay (HOSPITAL_COMMUNITY)
Admission: AD | Admit: 2013-02-04 | Discharge: 2013-02-05 | Disposition: A | Discharge status: Home / Self Care | Payer: Medicaid Other | Source: Ambulatory Visit | Attending: Obstetrics & Gynecology | Admitting: Obstetrics & Gynecology

## 2013-02-04 ENCOUNTER — Other Ambulatory Visit: Payer: Self-pay

## 2013-02-04 ENCOUNTER — Ambulatory Visit (HOSPITAL_COMMUNITY)
Admission: RE | Admit: 2013-02-04 | Discharge: 2013-02-04 | Disposition: A | Discharge status: Home / Self Care | Payer: Medicaid Other | Source: Ambulatory Visit | Attending: Obstetrics & Gynecology | Admitting: Obstetrics & Gynecology

## 2013-02-04 ENCOUNTER — Encounter (HOSPITAL_COMMUNITY): Payer: Self-pay

## 2013-02-04 DIAGNOSIS — Z283 Underimmunization status: Secondary | ICD-10-CM

## 2013-02-04 DIAGNOSIS — Z3490 Encounter for supervision of normal pregnancy, unspecified, unspecified trimester: Secondary | ICD-10-CM

## 2013-02-04 DIAGNOSIS — Z3682 Encounter for antenatal screening for nuchal translucency: Secondary | ICD-10-CM

## 2013-02-04 DIAGNOSIS — O351XX0 Maternal care for (suspected) chromosomal abnormality in fetus, not applicable or unspecified: Secondary | ICD-10-CM | POA: Insufficient documentation

## 2013-02-04 DIAGNOSIS — Z3689 Encounter for other specified antenatal screening: Secondary | ICD-10-CM | POA: Insufficient documentation

## 2013-02-04 DIAGNOSIS — O3510X0 Maternal care for (suspected) chromosomal abnormality in fetus, unspecified, not applicable or unspecified: Secondary | ICD-10-CM | POA: Insufficient documentation

## 2013-02-04 DIAGNOSIS — O99891 Other specified diseases and conditions complicating pregnancy: Secondary | ICD-10-CM

## 2013-02-04 DIAGNOSIS — M549 Dorsalgia, unspecified: Secondary | ICD-10-CM | POA: Insufficient documentation

## 2013-02-04 NOTE — Progress Notes (Signed)
[facility]  Indication: 27 yr old G3P1011 at [redacted]w[redacted]d for first trimester screen.  Findings: 1. Single intrauterine pregnancy. 2. Fetal crown rump length is consistent with dating. 3. Normal uterus; no adnexal masses seen. 4. Evaluation of fetal anatomy is limited by early gestational age. 5. Normal nuchal translucency measuring 1.71mm. 6. The nasal bone is visualized.  Recommendations: 1. First trimester screen done today; discussed limitations of screening tests in detecting fetal aneuploidy. 2. Recommend maternal serum AFP at 15-[redacted] weeks gestation. 3. Recommend fetal anatomic survey at 18-[redacted] weeks gestation.  Eulis Foster, MD

## 2013-02-04 NOTE — MAU Note (Signed)
Pt reports 2 weeks ago she "sprained her back", was seen at [facility]. For the last few days pain has worsened in her lower back and now having pain down her rt leg.

## 2013-02-05 ENCOUNTER — Encounter (HOSPITAL_COMMUNITY): Payer: Self-pay

## 2013-02-05 ENCOUNTER — Encounter: Payer: Self-pay | Admitting: Obstetrics & Gynecology

## 2013-02-05 DIAGNOSIS — O9989 Other specified diseases and conditions complicating pregnancy, childbirth and the puerperium: Secondary | ICD-10-CM

## 2013-02-05 DIAGNOSIS — Z283 Underimmunization status: Secondary | ICD-10-CM

## 2013-02-05 LAB — URINALYSIS, ROUTINE W REFLEX MICROSCOPIC
Bilirubin Urine: NEGATIVE
Nitrite: NEGATIVE
Specific Gravity, Urine: >1.03 — ABNORMAL HIGH (ref 1.005–1.030)
Urobilinogen, UA: 0.2 mg/dL (ref 0.0–1.0)
pH: 5.5 (ref 5.0–8.0)

## 2013-02-05 LAB — URINE MICROSCOPIC-ADD ON

## 2013-02-05 LAB — RAPID URINE DRUG SCREEN, HOSP PERFORMED
Barbiturates: NOT DETECTED
Cocaine: NOT DETECTED

## 2013-02-05 MED ORDER — CYCLOBENZAPRINE HCL 10 MG PO TABS
10.0000 mg | ORAL_TABLET | Freq: Once | ORAL | Status: AC
  Administered 2013-02-05: 10 mg via ORAL
  Filled 2013-02-05: qty 1

## 2013-02-05 MED ORDER — CYCLOBENZAPRINE HCL 10 MG PO TABS: 10.0000 mg | ORAL_TABLET | Freq: Three times a day (TID) | ORAL | Status: DC | PRN

## 2013-02-05 NOTE — MAU Provider Note (Signed)
History     CSN: [id number]  Arrival date and time: 02/04/13 2310   First Provider Initiated Contact with Patient 02/05/13 0114      Chief Complaint  Patient presents with  . Back Pain   Back Pain    Maureen Perez is a 27 y.o. G3P1011 at [redacted]w[redacted]d who presents today with back pain. She states that a couple of weeks ago she was diagnosed with a "back spur", and she has continued to have back pain and shooting pain down her right leg. She was given vicodin, but she has taken it all.   Past Medical History  Diagnosis Date  . Back pain   . Obesity   . Depression     no meds since pregancny  . Abnormal Pap smear     repeat WNL  . PONV (postoperative nausea and vomiting)   . Injury of tendon of left rotator cuff     Past Surgical History  Procedure Laterality Date  . Tubes in ears  age 21  . Tonsillectomy      age 41    Family History  Problem Relation Age of Onset  . Depression Mother   . Diabetes Mother   . Hypertension Mother   . COPD Mother   . Anesthesia problems Mother   . Depression Sister   . Diabetes Sister   . Kidney disease Sister   . Hypertension Sister   . Hypertension Father   . Anesthesia problems Father     History  Substance Use Topics  . Smoking status: Former Smoker -- 0.25 packs/day for 6 years    Types: Cigarettes  . Smokeless tobacco: Former Neurosurgeon    Quit date: 12/10/2012  . Alcohol Use: No    Allergies:  Allergies  Allergen Reactions  . Cephalexin Itching and Nausea And Vomiting  . Percocet [Oxycodone-Acetaminophen] Nausea And Vomiting    Prescriptions prior to admission  Medication Sig Dispense Refill  . Prenatal Vit-Fe Fumarate-FA (MULTIVITAMIN-PRENATAL) 27-0.8 MG TABS Take 1 tablet by mouth daily at 12 noon.      . promethazine (PHENERGAN) 25 MG tablet Take 1 tablet (25 mg total) by mouth every 6 (six) hours as needed for nausea.  30 tablet  1    Review of Systems  Musculoskeletal: Positive for back pain.   Physical Exam    Blood pressure 102/73, pulse 64, temperature 98.1 F (36.7 C), temperature source Oral, resp. rate 18, height 5\' 1"  (1.549 m), weight 68.493 kg (151 lb), last menstrual period 10/15/2012, SpO2 100.00%.  Physical Exam  Nursing note and vitals reviewed. Constitutional: She is oriented to person, place, and time. She appears well-developed and well-nourished. No distress.  Cardiovascular: Normal rate.   Respiratory: Effort normal.  GI: Soft. There is no tenderness.  Neurological: She is alert and oriented to person, place, and time.  Skin: Skin is warm and dry.  Psychiatric: She has a normal mood and affect.    MAU Course  Procedures  Results for orders placed during the hospital encounter of 02/04/13 (from the past 24 hour(s))  URINALYSIS, ROUTINE W REFLEX MICROSCOPIC     Status: Abnormal   Collection Time    02/04/13 11:51 PM      Result Value Range   Color, Urine YELLOW  YELLOW   APPearance HAZY (*) CLEAR   Specific Gravity, Urine >1.030 (*) 1.005 - 1.030   pH 5.5  5.0 - 8.0   Glucose, UA NEGATIVE  NEGATIVE mg/dL   Hgb urine  dipstick NEGATIVE  NEGATIVE   Bilirubin Urine NEGATIVE  NEGATIVE   Ketones, ur NEGATIVE  NEGATIVE mg/dL   Protein, ur NEGATIVE  NEGATIVE mg/dL   Urobilinogen, UA 0.2  0.0 - 1.0 mg/dL   Nitrite NEGATIVE  NEGATIVE   Leukocytes, UA TRACE (*) NEGATIVE  URINE MICROSCOPIC-ADD ON     Status: Abnormal   Collection Time    02/04/13 11:51 PM      Result Value Range   Squamous Epithelial / LPF FEW (*) RARE   WBC, UA 0-2  <3 WBC/hpf   RBC / HPF 0-2  <3 RBC/hpf   Bacteria, UA FEW (*) RARE   Crystals CA OXALATE CRYSTALS (*) NEGATIVE    0159: Patient reports that her pain is better now, and she would like to go home.  Assessment and Plan   1. Rubella non-immune status, antepartum   2. Back pain complicating pregnancy in second trimester    Rx flexeril FU with the office as planned   Tawnya Crook 02/05/2013, 1:29 AM ha

## 2013-02-08 NOTE — MAU Provider Note (Signed)
Attestation of Attending Supervision of Advanced Practitioner (CNM/NP): Evaluation and management procedures were performed by the Advanced Practitioner under my supervision and collaboration.  I have reviewed the Advanced Practitioner's note and chart, and I agree with the management and plan.  HARRAWAY-SMITH, Teron Blais 5:36 PM     

## 2013-02-09 ENCOUNTER — Ambulatory Visit (INDEPENDENT_AMBULATORY_CARE_PROVIDER_SITE_OTHER): Payer: Medicaid Other | Admitting: Family Medicine

## 2013-02-09 VITALS — BP 109/71 | Wt 150.0 lb

## 2013-02-09 DIAGNOSIS — Z3400 Encounter for supervision of normal first pregnancy, unspecified trimester: Secondary | ICD-10-CM

## 2013-02-09 DIAGNOSIS — Z3402 Encounter for supervision of normal first pregnancy, second trimester: Secondary | ICD-10-CM

## 2013-02-09 NOTE — Progress Notes (Signed)
Doing well--Nml NT--don't see fetal screen results yet.

## 2013-02-09 NOTE — Patient Instructions (Addendum)
Pregnancy - Second Trimester The second trimester of pregnancy (3 to 6 months) is a period of rapid growth for you and your baby. At the end of the sixth month, your baby is about 9 inches long and weighs 1 1/2 pounds. You will begin to feel the baby move between 18 and 20 weeks of the pregnancy. This is called quickening. Weight gain is faster. A clear fluid (colostrum) may leak out of your breasts. You may feel small contractions of the womb (uterus). This is known as false labor or Braxton-Hicks contractions. This is like a practice for labor when the baby is ready to be born. Usually, the problems with morning sickness have usually passed by the end of your first trimester. Some women develop small dark blotches (called cholasma, mask of pregnancy) on their face that usually goes away after the baby is born. Exposure to the sun makes the blotches worse. Acne may also develop in some pregnant women and pregnant women who have acne, may find that it goes away. PRENATAL EXAMS  Blood work may continue to be done during prenatal exams. These tests are done to check on your health and the probable health of your baby. Blood work is used to follow your blood levels (hemoglobin). Anemia (low hemoglobin) is common during pregnancy. Iron and vitamins are given to help prevent this. You will also be checked for diabetes between 24 and 28 weeks of the pregnancy. Some of the previous blood tests may be repeated.  The size of the uterus is measured during each visit. This is to make sure that the baby is continuing to grow properly according to the dates of the pregnancy.  Your blood pressure is checked every prenatal visit. This is to make sure you are not getting toxemia.  Your urine is checked to make sure you do not have an infection, diabetes or protein in the urine.  Your weight is checked often to make sure gains are happening at the suggested rate. This is to ensure that both you and your baby are  growing normally.  Sometimes, an ultrasound is performed to confirm the proper growth and development of the baby. This is a test which bounces harmless sound waves off the baby so your caregiver can more accurately determine due dates. Sometimes, a test is done on the amniotic fluid surrounding the baby. This test is called an amniocentesis. The amniotic fluid is obtained by sticking a needle into the belly (abdomen). This is done to check the chromosomes in instances where there is a concern about possible genetic problems with the baby. It is also sometimes done near the end of pregnancy if an early delivery is required. In this case, it is done to help make sure the baby's lungs are mature enough for the baby to live outside of the womb. CHANGES OCCURING IN THE SECOND TRIMESTER OF PREGNANCY Your body goes through many changes during pregnancy. They vary from person to person. Talk to your caregiver about changes you notice that you are concerned about.  During the second trimester, you will likely have an increase in your appetite. It is normal to have cravings for certain foods. This varies from person to person and pregnancy to pregnancy.  Your lower abdomen will begin to bulge.  You may have to urinate more often because the uterus and baby are pressing on your bladder. It is also common to get more bladder infections during pregnancy. You can help this by drinking lots of fluids   and emptying your bladder before and after intercourse.  You may begin to get stretch marks on your hips, abdomen, and breasts. These are normal changes in the body during pregnancy. There are no exercises or medicines to take that prevent this change.  You may begin to develop swollen and bulging veins (varicose veins) in your legs. Wearing support hose, elevating your feet for 15 minutes, 3 to 4 times a day and limiting salt in your diet helps lessen the problem.  Heartburn may develop as the uterus grows and  pushes up against the stomach. Antacids recommended by your caregiver helps with this problem. Also, eating smaller meals 4 to 5 times a day helps.  Constipation can be treated with a stool softener or adding bulk to your diet. Drinking lots of fluids, and eating vegetables, fruits, and whole grains are helpful.  Exercising is also helpful. If you have been very active up until your pregnancy, most of these activities can be continued during your pregnancy. If you have been less active, it is helpful to start an exercise program such as walking.  Hemorrhoids may develop at the end of the second trimester. Warm sitz baths and hemorrhoid cream recommended by your caregiver helps hemorrhoid problems.  Backaches may develop during this time of your pregnancy. Avoid heavy lifting, wear low heal shoes, and practice good posture to help with backache problems.  Some pregnant women develop tingling and numbness of their hand and fingers because of swelling and tightening of ligaments in the wrist (carpel tunnel syndrome). This goes away after the baby is born.  As your breasts enlarge, you may have to get a bigger bra. Get a comfortable, cotton, support bra. Do not get a nursing bra until the last month of the pregnancy if you will be nursing the baby.  You may get a dark line from your belly button to the pubic area called the linea nigra.  You may develop rosy cheeks because of increase blood flow to the face.  You may develop spider looking lines of the face, neck, arms, and chest. These go away after the baby is born. HOME CARE INSTRUCTIONS   It is extremely important to avoid all smoking, herbs, alcohol, and unprescribed drugs during your pregnancy. These chemicals affect the formation and growth of the baby. Avoid these chemicals throughout the pregnancy to ensure the delivery of a healthy infant.  Most of your home care instructions are the same as suggested for the first trimester of your  pregnancy. Keep your caregiver's appointments. Follow your caregiver's instructions regarding medicine use, exercise, and diet.  During pregnancy, you are providing food for you and your baby. Continue to eat regular, well-balanced meals. Choose foods such as meat, fish, milk and other low fat dairy products, vegetables, fruits, and whole-grain breads and cereals. Your caregiver will tell you of the ideal weight gain.  A physical sexual relationship may be continued up until near the end of pregnancy if there are no other problems. Problems could include early (premature) leaking of amniotic fluid from the membranes, vaginal bleeding, abdominal pain, or other medical or pregnancy problems.  Exercise regularly if there are no restrictions. Check with your caregiver if you are unsure of the safety of some of your exercises. The greatest weight gain will occur in the last 2 trimesters of pregnancy. Exercise will help you:  Control your weight.  Get you in shape for labor and delivery.  Lose weight after you have the baby.  Wear   a good support or jogging bra for breast tenderness during pregnancy. This may help if worn during sleep. Pads or tissues may be used in the bra if you are leaking colostrum.  Do not use hot tubs, steam rooms or saunas throughout the pregnancy.  Wear your seat belt at all times when driving. This protects you and your baby if you are in an accident.  Avoid raw meat, uncooked cheese, cat litter boxes, and soil used by cats. These carry germs that can cause birth defects in the baby.  The second trimester is also a good time to visit your dentist for your dental health if this has not been done yet. Getting your teeth cleaned is okay. Use a soft toothbrush. Brush gently during pregnancy.  It is easier to leak urine during pregnancy. Tightening up and strengthening the pelvic muscles will help with this problem. Practice stopping your urination while you are going to the  bathroom. These are the same muscles you need to strengthen. It is also the muscles you would use as if you were trying to stop from passing gas. You can practice tightening these muscles up 10 times a set and repeating this about 3 times per day. Once you know what muscles to tighten up, do not perform these exercises during urination. It is more likely to contribute to an infection by backing up the urine.  Ask for help if you have financial, counseling, or nutritional needs during pregnancy. Your caregiver will be able to offer counseling for these needs as well as refer you for other special needs.  Your skin may become oily. If so, wash your face with mild soap, use non-greasy moisturizer and oil or cream based makeup. MEDICINES AND DRUG USE IN PREGNANCY  Take prenatal vitamins as directed. The vitamin should contain 1 milligram of folic acid. Keep all vitamins out of reach of children. Only a couple vitamins or tablets containing iron may be fatal to a baby or young child when ingested.  Avoid use of all medicines, including herbs, over-the-counter medicines, not prescribed or suggested by your caregiver. Only take over-the-counter or prescription medicines for pain, discomfort, or fever as directed by your caregiver. Do not use aspirin.  Let your caregiver also know about herbs you may be using.  Alcohol is related to a number of birth defects. This includes fetal alcohol syndrome. All alcohol, in any form, should be avoided completely. Smoking will cause low birth rate and premature babies.  Street or illegal drugs are very harmful to the baby. They are absolutely forbidden. A baby born to an addicted mother will be addicted at birth. The baby will go through the same withdrawal an adult does. SEEK MEDICAL CARE IF:  You have any concerns or worries during your pregnancy. It is better to call with your questions if you feel they cannot wait, rather than worry about them. SEEK IMMEDIATE  MEDICAL CARE IF:   An unexplained oral temperature above 102 F (38.9 C) develops, or as your caregiver suggests.  You have leaking of fluid from the vagina (birth canal). If leaking membranes are suspected, take your temperature and tell your caregiver of this when you call.  There is vaginal spotting, bleeding, or passing clots. Tell your caregiver of the amount and how many pads are used. Light spotting in pregnancy is common, especially following intercourse.  You develop a bad smelling vaginal discharge with a change in the color from clear to white.  You continue to feel   sick to your stomach (nauseated) and have no relief from remedies suggested. You vomit blood or coffee ground-like materials.  You lose more than 2 pounds of weight or gain more than 2 pounds of weight over 1 week, or as suggested by your caregiver.  You notice swelling of your face, hands, feet, or legs.  You get exposed to German measles and have never had them.  You are exposed to fifth disease or chickenpox.  You develop belly (abdominal) pain. Round ligament discomfort is a common non-cancerous (benign) cause of abdominal pain in pregnancy. Your caregiver still must evaluate you.  You develop a bad headache that does not go away.  You develop fever, diarrhea, pain with urination, or shortness of breath.  You develop visual problems, blurry, or double vision.  You fall or are in a car accident or any kind of trauma.  There is mental or physical violence at home. Document Released: 05/28/2001 Document Revised: 02/26/2012 Document Reviewed: 11/30/2008 [employer] Patient Information 2014 [employer].  Breastfeeding A change in hormones during your pregnancy causes growth of your breast tissue and an increase in number and size of milk ducts. The hormone prolactin allows proteins, sugars, and fats from your blood supply to make breast milk in your milk-producing glands. The hormone progesterone prevents  breast milk from being released before the birth of your baby. After the birth of your baby, your progesterone level decreases allowing breast milk to be released. Thoughts of your baby, as well as his or her sucking or crying, can stimulate the release of milk from the milk-producing glands. Deciding to breastfeed (nurse) is one of the best choices you can make for you and your baby. The information that follows gives a brief review of the benefits, as well as other important skills to know about breastfeeding. BENEFITS OF BREASTFEEDING For your baby  The first milk (colostrum) helps your baby's digestive system function better.   There are antibodies in your milk that help your baby fight off infections.   Your baby has a lower incidence of asthma, allergies, and sudden infant death syndrome (SIDS).   The nutrients in breast milk are better for your baby than infant formulas.  Breast milk improves your baby's brain development.   Your baby will have less gas, colic, and constipation.  Your baby is less likely to develop other conditions, such as childhood obesity, asthma, or diabetes mellitus. For you  Breastfeeding helps develop a very special bond between you and your baby.   Breastfeeding is convenient, always available at the correct temperature, and costs nothing.   Breastfeeding helps to burn calories and helps you lose the weight gained during pregnancy.   Breastfeeding makes your uterus contract back down to normal size faster and slows bleeding following delivery.   Breastfeeding mothers have a lower risk of developing osteoporosis or breast or ovarian cancer later in life.  BREASTFEEDING FREQUENCY  A healthy, full-term baby may breastfeed as often as every hour or space his or her feedings to every 3 hours. Breastfeeding frequency will vary from baby to baby.   Newborns should be fed no less than every 2 3 hours during the day and every 4 5 hours during the  night. You should breastfeed a minimum of 8 feedings in a 24 hour period.  Awaken your baby to breastfeed if it has been 3 4 hours since the last feeding.  Breastfeed when you feel the need to reduce the fullness of your breasts or when   your newborn shows signs of hunger. Signs that your baby may be hungry include:  Increased alertness or activity.  Stretching.  Movement of the head from side to side.  Movement of the head and opening of the mouth when the corner of the mouth or cheek is stroked (rooting).  Increased sucking sounds, smacking lips, cooing, sighing, or squeaking.  Hand-to-mouth movements.  Increased sucking of fingers or hands.  Fussing.  Intermittent crying.  Signs of extreme hunger will require calming and consoling before you try to feed your baby. Signs of extreme hunger may include:  Restlessness.  A loud, strong cry.  Screaming.  Frequent feeding will help you make more milk and will help prevent problems, such as sore nipples and engorgement of the breasts.  BREASTFEEDING   Whether lying down or sitting, be sure that the baby's abdomen is facing your abdomen.   Support your breast with 4 fingers under your breast and your thumb above your nipple. Make sure your fingers are well away from your nipple and your baby's mouth.   Stroke your baby's lips gently with your finger or nipple.   When your baby's mouth is open wide enough, place all of your nipple and as much of the colored area around your nipple (areola) as possible into your baby's mouth.  More areola should be visible above his or her upper lip than below his or her lower lip.  Your baby's tongue should be between his or her lower gum and your breast.  Ensure that your baby's mouth is correctly positioned around the nipple (latched). Your baby's lips should create a seal on your breast.  Signs that your baby has effectively latched onto your nipple include:  Tugging or sucking  without pain.  Swallowing heard between sucks.  Absent click or smacking sound.  Muscle movement above and in front of his or her ears with sucking.  Your baby must suck about 2 3 minutes in order to get your milk. Allow your baby to feed on each breast as long as he or she wants. Nurse your baby until he or she unlatches or falls asleep at the first breast, then offer the second breast.  Signs that your baby is full and satisfied include:  A gradual decrease in the number of sucks or complete cessation of sucking.  Falling asleep.  Extension or relaxation of his or her body.  Retention of a small amount of milk in his or her mouth.  Letting go of your breast by himself or herself.  Signs of effective breastfeeding in you include:  Breasts that have increased firmness, weight, and size prior to feeding.  Breasts that are softer after nursing.  Increased milk volume, as well as a change in milk consistency and color by the 5th day of breastfeeding.  Breast fullness relieved by breastfeeding.  Nipples are not sore, cracked, or bleeding.  If needed, break the suction by putting your finger into the corner of your baby's mouth and sliding your finger between his or her gums. Then, remove your breast from his or her mouth.  It is common for babies to spit up a small amount after a feeding.  Babies often swallow air during feeding. This can make babies fussy. Burping your baby between breasts can help with this.  Vitamin D supplements are recommended for babies who get only breast milk.  Avoid using a pacifier during your baby's first 4 6 weeks.  Avoid supplemental feedings of water, formula, or   juice in place of breastfeeding. Breast milk is all the food your baby needs. It is not necessary for your baby to have water or formula. Your breasts will make more milk if supplemental feedings are avoided during the early weeks. HOW TO TELL WHETHER YOUR BABY IS GETTING ENOUGH BREAST  MILK Wondering whether or not your baby is getting enough milk is a common concern among mothers. You can be assured that your baby is getting enough milk if:   Your baby is actively sucking and you hear swallowing.   Your baby seems relaxed and satisfied after a feeding.   Your baby nurses at least 8 12 times in a 24 hour time period.  During the first 3 5 days of age:  Your baby is wetting at least 3 5 diapers in a 24 hour period. The urine should be clear and pale yellow.  Your baby is having at least 3 4 stools in a 24 hour period. The stool should be soft and yellow.  At 5 7 days of age, your baby is having at least 3 6 stools in a 24 hour period. The stool should be seedy and yellow by 5 days of age.  Your baby has a weight loss less than 7 10% during the first 3 days of age.  Your baby does not lose weight after 3 7 days of age.  Your baby gains 4 7 ounces each week after he or she is 4 days of age.  Your baby gains weight by 5 days of age and is back to birth weight within 2 weeks. ENGORGEMENT In the first week after your baby is born, you may experience extremely full breasts (engorgement). When engorged, your breasts may feel heavy, warm, or tender to the touch. Engorgement peaks within 24 48 hours after delivery of your baby.  Engorgement may be reduced by:  Continuing to breastfeed.  Increasing the frequency of breastfeeding.  Taking warm showers or applying warm, moist heat to your breasts just before each feeding. This increases circulation and helps the milk flow.   Gently massaging your breast before and during the feedings. With your fingertips, massage from your chest wall towards your nipple in a circular motion.   Ensuring that your baby empties at least one breast at every feeding. It also helps to start the next feeding on the opposite breast.   Expressing breast milk by hand or by using a breast pump to empty the breasts if your baby is sleepy, or  not nursing well. You may also want to express milk if you are returning to work oryou feel you are getting engorged.  Ensuring your baby is latched on and positioned properly while breastfeeding. If you follow these suggestions, your engorgement should improve in 24 48 hours. If you are still experiencing difficulty, call your lactation consultant or caregiver.  CARING FOR YOURSELF Take care of your breasts.  Bathe or shower daily.   Avoid using soap on your nipples.   Wear a supportive bra. Avoid wearing underwire style bras.  Air dry your nipples for a 3 4minutes after each feeding.   Use only cotton bra pads to absorb breast milk leakage. Leaking of breast milk between feedings is normal.   Use only pure lanolin on your nipples after nursing. You do not need to wash it off before feeding your baby again. Another option is to express a few drops of breast milk and gently massage that milk into your nipples.  Continue   breast self-awareness checks. Take care of yourself.  Eat healthy foods. Alternate 3 meals with 3 snacks.  Avoid foods that you notice affect your baby in a bad way.  Drink milk, fruit juice, and water to satisfy your thirst (about 8 glasses a day).   Rest often, relax, and take your prenatal vitamins to prevent fatigue, stress, and anemia.  Avoid chewing and smoking tobacco.  Avoid alcohol and drug use.  Take over-the-counter and prescribed medicine only as directed by your caregiver or pharmacist. You should always check with your caregiver or pharmacist before taking any new medicine, vitamin, or herbal supplement.  Know that pregnancy is possible while breastfeeding. If desired, talk to your caregiver about family planning and safe birth control methods that may be used while breastfeeding. SEEK MEDICAL CARE IF:   You feel like you want to stop breastfeeding or have become frustrated with breastfeeding.  You have painful breasts or nipples.  Your  nipples are cracked or bleeding.  Your breasts are red, tender, or warm.  You have a swollen area on either breast.  You have a fever or chills.  You have nausea or vomiting.  You have drainage from your nipples.  Your breasts do not become full before feedings by the 5th day after delivery.  You feel sad and depressed.  Your baby is too sleepy to eat well.  Your baby is having trouble sleeping.   Your baby is wetting less than 3 diapers in a 24 hour period.  Your baby has less than 3 stools in a 24 hour period.  Your baby's skin or the white part of his or her eyes becomes more yellow.   Your baby is not gaining weight by 28 days of age. MAKE SURE YOU:   Understand these instructions.  Will watch your condition.  Will get help right away if you are not doing well or get worse. Document Released: 06/03/2005 Document Revised: 02/26/2012 Document Reviewed: 01/08/2012 [facility] Patient Information 2014 [place], [place]. Chronic Back Pain  When back pain lasts longer than 3 months, it is called chronic back pain.People with chronic back pain often go through certain periods that are more intense (flare-ups).  CAUSES Chronic back pain can be caused by wear and tear (degeneration) on different structures in your back. These structures include:  The bones of your spine (vertebrae) and the joints surrounding your spinal cord and nerve roots (facets).  The strong, fibrous tissues that connect your vertebrae (ligaments). Degeneration of these structures may result in pressure on your nerves. This can lead to constant pain. HOME CARE INSTRUCTIONS  Avoid bending, heavy lifting, prolonged sitting, and activities which make the problem worse.  Take brief periods of rest throughout the day to reduce your pain. Lying down or standing usually is better than sitting while you are resting.  Take over-the-counter or prescription medicines only as directed by your caregiver. SEEK  IMMEDIATE MEDICAL CARE IF:   You have weakness or numbness in one of your legs or feet.  You have trouble controlling your bladder or bowels.  You have nausea, vomiting, abdominal pain, shortness of breath, or fainting. Document Released: 07/11/2004 Document Revised: 08/26/2011 Document Reviewed: 05/18/2011 [facility] Patient Information 2014 [place], [place]. Back Exercises Back exercises help treat and prevent back injuries. The goal of back exercises is to increase the strength of your abdominal and back muscles and the flexibility of your back. These exercises should be started when you no longer have back pain. Back exercises include:  Pelvic Tilt. Lie on your back with your knees bent. Tilt your pelvis until the lower part of your back is against the floor. Hold this position 5 to 10 sec and repeat 5 to 10 times.  Knee to Chest. Pull first 1 knee up against your chest and hold for 20 to 30 seconds, repeat this with the other knee, and then both knees. This may be done with the other leg straight or bent, whichever feels better.  Sit-Ups or Curl-Ups. Bend your knees 90 degrees. Start with tilting your pelvis, and do a partial, slow sit-up, lifting your trunk only 30 to 45 degrees off the floor. Take at least 2 to 3 seconds for each sit-up. Do not do sit-ups with your knees out straight. If partial sit-ups are difficult, simply do the above but with only tightening your abdominal muscles and holding it as directed.  Hip-Lift. Lie on your back with your knees flexed 90 degrees. Push down with your feet and shoulders as you raise your hips a couple inches off the floor; hold for 10 seconds, repeat 5 to 10 times.  Back arches. Lie on your stomach, propping yourself up on bent elbows. Slowly press on your hands, causing an arch in your low back. Repeat 3 to 5 times. Any initial stiffness and discomfort should lessen with repetition over time.  Shoulder-Lifts. Lie face down with arms beside your  body. Keep hips and torso pressed to floor as you slowly lift your head and shoulders off the floor. Do not overdo your exercises, especially in the beginning. Exercises may cause you some mild back discomfort which lasts for a few minutes; however, if the pain is more severe, or lasts for more than 15 minutes, do not continue exercises until you see your caregiver. Improvement with exercise therapy for back problems is slow.  See your caregivers for assistance with developing a proper back exercise program. Document Released: 07/11/2004 Document Revised: 08/26/2011 Document Reviewed: 04/04/2011 [facility] Patient Information 2014 [place], [place].

## 2013-02-09 NOTE — Progress Notes (Signed)
[id number]  

## 2013-03-09 ENCOUNTER — Ambulatory Visit (INDEPENDENT_AMBULATORY_CARE_PROVIDER_SITE_OTHER): Payer: Medicaid Other | Admitting: Family Medicine

## 2013-03-09 VITALS — BP 124/73 | Wt 149.0 lb

## 2013-03-09 DIAGNOSIS — Z3402 Encounter for supervision of normal first pregnancy, second trimester: Secondary | ICD-10-CM

## 2013-03-09 DIAGNOSIS — Z3400 Encounter for supervision of normal first pregnancy, unspecified trimester: Secondary | ICD-10-CM

## 2013-03-09 NOTE — Assessment & Plan Note (Signed)
Continue routine prenatal care.  

## 2013-03-09 NOTE — Patient Instructions (Signed)
Pregnancy - Second Trimester The second trimester of pregnancy (3 to 6 months) is a period of rapid growth for you and your baby. At the end of the sixth month, your baby is about 9 inches long and weighs 1 1/2 pounds. You will begin to feel the baby move between 18 and 20 weeks of the pregnancy. This is called quickening. Weight gain is faster. A clear fluid (colostrum) may leak out of your breasts. You may feel small contractions of the womb (uterus). This is known as false labor or Braxton-Hicks contractions. This is like a practice for labor when the baby is ready to be born. Usually, the problems with morning sickness have usually passed by the end of your first trimester. Some women develop small dark blotches (called cholasma, mask of pregnancy) on their face that usually goes away after the baby is born. Exposure to the sun makes the blotches worse. Acne may also develop in some pregnant women and pregnant women who have acne, may find that it goes away. PRENATAL EXAMS  Blood work may continue to be done during prenatal exams. These tests are done to check on your health and the probable health of your baby. Blood work is used to follow your blood levels (hemoglobin). Anemia (low hemoglobin) is common during pregnancy. Iron and vitamins are given to help prevent this. You will also be checked for diabetes between 24 and 28 weeks of the pregnancy. Some of the previous blood tests may be repeated.  The size of the uterus is measured during each visit. This is to make sure that the baby is continuing to grow properly according to the dates of the pregnancy.  Your blood pressure is checked every prenatal visit. This is to make sure you are not getting toxemia.  Your urine is checked to make sure you do not have an infection, diabetes or protein in the urine.  Your weight is checked often to make sure gains are happening at the suggested rate. This is to ensure that both you and your baby are  growing normally.  Sometimes, an ultrasound is performed to confirm the proper growth and development of the baby. This is a test which bounces harmless sound waves off the baby so your caregiver can more accurately determine due dates. Sometimes, a test is done on the amniotic fluid surrounding the baby. This test is called an amniocentesis. The amniotic fluid is obtained by sticking a needle into the belly (abdomen). This is done to check the chromosomes in instances where there is a concern about possible genetic problems with the baby. It is also sometimes done near the end of pregnancy if an early delivery is required. In this case, it is done to help make sure the baby's lungs are mature enough for the baby to live outside of the womb. CHANGES OCCURING IN THE SECOND TRIMESTER OF PREGNANCY Your body goes through many changes during pregnancy. They vary from person to person. Talk to your caregiver about changes you notice that you are concerned about.  During the second trimester, you will likely have an increase in your appetite. It is normal to have cravings for certain foods. This varies from person to person and pregnancy to pregnancy.  Your lower abdomen will begin to bulge.  You may have to urinate more often because the uterus and baby are pressing on your bladder. It is also common to get more bladder infections during pregnancy. You can help this by drinking lots of fluids   and emptying your bladder before and after intercourse.  You may begin to get stretch marks on your hips, abdomen, and breasts. These are normal changes in the body during pregnancy. There are no exercises or medicines to take that prevent this change.  You may begin to develop swollen and bulging veins (varicose veins) in your legs. Wearing support hose, elevating your feet for 15 minutes, 3 to 4 times a day and limiting salt in your diet helps lessen the problem.  Heartburn may develop as the uterus grows and  pushes up against the stomach. Antacids recommended by your caregiver helps with this problem. Also, eating smaller meals 4 to 5 times a day helps.  Constipation can be treated with a stool softener or adding bulk to your diet. Drinking lots of fluids, and eating vegetables, fruits, and whole grains are helpful.  Exercising is also helpful. If you have been very active up until your pregnancy, most of these activities can be continued during your pregnancy. If you have been less active, it is helpful to start an exercise program such as walking.  Hemorrhoids may develop at the end of the second trimester. Warm sitz baths and hemorrhoid cream recommended by your caregiver helps hemorrhoid problems.  Backaches may develop during this time of your pregnancy. Avoid heavy lifting, wear low heal shoes, and practice good posture to help with backache problems.  Some pregnant women develop tingling and numbness of their hand and fingers because of swelling and tightening of ligaments in the wrist (carpel tunnel syndrome). This goes away after the baby is born.  As your breasts enlarge, you may have to get a bigger bra. Get a comfortable, cotton, support bra. Do not get a nursing bra until the last month of the pregnancy if you will be nursing the baby.  You may get a dark line from your belly button to the pubic area called the linea nigra.  You may develop rosy cheeks because of increase blood flow to the face.  You may develop spider looking lines of the face, neck, arms, and chest. These go away after the baby is born. HOME CARE INSTRUCTIONS   It is extremely important to avoid all smoking, herbs, alcohol, and unprescribed drugs during your pregnancy. These chemicals affect the formation and growth of the baby. Avoid these chemicals throughout the pregnancy to ensure the delivery of a healthy infant.  Most of your home care instructions are the same as suggested for the first trimester of your  pregnancy. Keep your caregiver's appointments. Follow your caregiver's instructions regarding medicine use, exercise, and diet.  During pregnancy, you are providing food for you and your baby. Continue to eat regular, well-balanced meals. Choose foods such as meat, fish, milk and other low fat dairy products, vegetables, fruits, and whole-grain breads and cereals. Your caregiver will tell you of the ideal weight gain.  A physical sexual relationship may be continued up until near the end of pregnancy if there are no other problems. Problems could include early (premature) leaking of amniotic fluid from the membranes, vaginal bleeding, abdominal pain, or other medical or pregnancy problems.  Exercise regularly if there are no restrictions. Check with your caregiver if you are unsure of the safety of some of your exercises. The greatest weight gain will occur in the last 2 trimesters of pregnancy. Exercise will help you:  Control your weight.  Get you in shape for labor and delivery.  Lose weight after you have the baby.  Wear   a good support or jogging bra for breast tenderness during pregnancy. This may help if worn during sleep. Pads or tissues may be used in the bra if you are leaking colostrum.  Do not use hot tubs, steam rooms or saunas throughout the pregnancy.  Wear your seat belt at all times when driving. This protects you and your baby if you are in an accident.  Avoid raw meat, uncooked cheese, cat litter boxes, and soil used by cats. These carry germs that can cause birth defects in the baby.  The second trimester is also a good time to visit your dentist for your dental health if this has not been done yet. Getting your teeth cleaned is okay. Use a soft toothbrush. Brush gently during pregnancy.  It is easier to leak urine during pregnancy. Tightening up and strengthening the pelvic muscles will help with this problem. Practice stopping your urination while you are going to the  bathroom. These are the same muscles you need to strengthen. It is also the muscles you would use as if you were trying to stop from passing gas. You can practice tightening these muscles up 10 times a set and repeating this about 3 times per day. Once you know what muscles to tighten up, do not perform these exercises during urination. It is more likely to contribute to an infection by backing up the urine.  Ask for help if you have financial, counseling, or nutritional needs during pregnancy. Your caregiver will be able to offer counseling for these needs as well as refer you for other special needs.  Your skin may become oily. If so, wash your face with mild soap, use non-greasy moisturizer and oil or cream based makeup. MEDICINES AND DRUG USE IN PREGNANCY  Take prenatal vitamins as directed. The vitamin should contain 1 milligram of folic acid. Keep all vitamins out of reach of children. Only a couple vitamins or tablets containing iron may be fatal to a baby or young child when ingested.  Avoid use of all medicines, including herbs, over-the-counter medicines, not prescribed or suggested by your caregiver. Only take over-the-counter or prescription medicines for pain, discomfort, or fever as directed by your caregiver. Do not use aspirin.  Let your caregiver also know about herbs you may be using.  Alcohol is related to a number of birth defects. This includes fetal alcohol syndrome. All alcohol, in any form, should be avoided completely. Smoking will cause low birth rate and premature babies.  Street or illegal drugs are very harmful to the baby. They are absolutely forbidden. A baby born to an addicted mother will be addicted at birth. The baby will go through the same withdrawal an adult does. SEEK MEDICAL CARE IF:  You have any concerns or worries during your pregnancy. It is better to call with your questions if you feel they cannot wait, rather than worry about them. SEEK IMMEDIATE  MEDICAL CARE IF:   An unexplained oral temperature above 102 F (38.9 C) develops, or as your caregiver suggests.  You have leaking of fluid from the vagina (birth canal). If leaking membranes are suspected, take your temperature and tell your caregiver of this when you call.  There is vaginal spotting, bleeding, or passing clots. Tell your caregiver of the amount and how many pads are used. Light spotting in pregnancy is common, especially following intercourse.  You develop a bad smelling vaginal discharge with a change in the color from clear to white.  You continue to feel   sick to your stomach (nauseated) and have no relief from remedies suggested. You vomit blood or coffee ground-like materials.  You lose more than 2 pounds of weight or gain more than 2 pounds of weight over 1 week, or as suggested by your caregiver.  You notice swelling of your face, hands, feet, or legs.  You get exposed to German measles and have never had them.  You are exposed to fifth disease or chickenpox.  You develop belly (abdominal) pain. Round ligament discomfort is a common non-cancerous (benign) cause of abdominal pain in pregnancy. Your caregiver still must evaluate you.  You develop a bad headache that does not go away.  You develop fever, diarrhea, pain with urination, or shortness of breath.  You develop visual problems, blurry, or double vision.  You fall or are in a car accident or any kind of trauma.  There is mental or physical violence at home. Document Released: 05/28/2001 Document Revised: 02/26/2012 Document Reviewed: 11/30/2008 [employer] Patient Information 2014 [employer].  Breastfeeding A change in hormones during your pregnancy causes growth of your breast tissue and an increase in number and size of milk ducts. The hormone prolactin allows proteins, sugars, and fats from your blood supply to make breast milk in your milk-producing glands. The hormone progesterone prevents  breast milk from being released before the birth of your baby. After the birth of your baby, your progesterone level decreases allowing breast milk to be released. Thoughts of your baby, as well as his or her sucking or crying, can stimulate the release of milk from the milk-producing glands. Deciding to breastfeed (nurse) is one of the best choices you can make for you and your baby. The information that follows gives a brief review of the benefits, as well as other important skills to know about breastfeeding. BENEFITS OF BREASTFEEDING For your baby  The first milk (colostrum) helps your baby's digestive system function better.   There are antibodies in your milk that help your baby fight off infections.   Your baby has a lower incidence of asthma, allergies, and sudden infant death syndrome (SIDS).   The nutrients in breast milk are better for your baby than infant formulas.  Breast milk improves your baby's brain development.   Your baby will have less gas, colic, and constipation.  Your baby is less likely to develop other conditions, such as childhood obesity, asthma, or diabetes mellitus. For you  Breastfeeding helps develop a very special bond between you and your baby.   Breastfeeding is convenient, always available at the correct temperature, and costs nothing.   Breastfeeding helps to burn calories and helps you lose the weight gained during pregnancy.   Breastfeeding makes your uterus contract back down to normal size faster and slows bleeding following delivery.   Breastfeeding mothers have a lower risk of developing osteoporosis or breast or ovarian cancer later in life.  BREASTFEEDING FREQUENCY  A healthy, full-term baby may breastfeed as often as every hour or space his or her feedings to every 3 hours. Breastfeeding frequency will vary from baby to baby.   Newborns should be fed no less than every 2 3 hours during the day and every 4 5 hours during the  night. You should breastfeed a minimum of 8 feedings in a 24 hour period.  Awaken your baby to breastfeed if it has been 3 4 hours since the last feeding.  Breastfeed when you feel the need to reduce the fullness of your breasts or when   your newborn shows signs of hunger. Signs that your baby may be hungry include:  Increased alertness or activity.  Stretching.  Movement of the head from side to side.  Movement of the head and opening of the mouth when the corner of the mouth or cheek is stroked (rooting).  Increased sucking sounds, smacking lips, cooing, sighing, or squeaking.  Hand-to-mouth movements.  Increased sucking of fingers or hands.  Fussing.  Intermittent crying.  Signs of extreme hunger will require calming and consoling before you try to feed your baby. Signs of extreme hunger may include:  Restlessness.  A loud, strong cry.  Screaming.  Frequent feeding will help you make more milk and will help prevent problems, such as sore nipples and engorgement of the breasts.  BREASTFEEDING   Whether lying down or sitting, be sure that the baby's abdomen is facing your abdomen.   Support your breast with 4 fingers under your breast and your thumb above your nipple. Make sure your fingers are well away from your nipple and your baby's mouth.   Stroke your baby's lips gently with your finger or nipple.   When your baby's mouth is open wide enough, place all of your nipple and as much of the colored area around your nipple (areola) as possible into your baby's mouth.  More areola should be visible above his or her upper lip than below his or her lower lip.  Your baby's tongue should be between his or her lower gum and your breast.  Ensure that your baby's mouth is correctly positioned around the nipple (latched). Your baby's lips should create a seal on your breast.  Signs that your baby has effectively latched onto your nipple include:  Tugging or sucking  without pain.  Swallowing heard between sucks.  Absent click or smacking sound.  Muscle movement above and in front of his or her ears with sucking.  Your baby must suck about 2 3 minutes in order to get your milk. Allow your baby to feed on each breast as long as he or she wants. Nurse your baby until he or she unlatches or falls asleep at the first breast, then offer the second breast.  Signs that your baby is full and satisfied include:  A gradual decrease in the number of sucks or complete cessation of sucking.  Falling asleep.  Extension or relaxation of his or her body.  Retention of a small amount of milk in his or her mouth.  Letting go of your breast by himself or herself.  Signs of effective breastfeeding in you include:  Breasts that have increased firmness, weight, and size prior to feeding.  Breasts that are softer after nursing.  Increased milk volume, as well as a change in milk consistency and color by the 5th day of breastfeeding.  Breast fullness relieved by breastfeeding.  Nipples are not sore, cracked, or bleeding.  If needed, break the suction by putting your finger into the corner of your baby's mouth and sliding your finger between his or her gums. Then, remove your breast from his or her mouth.  It is common for babies to spit up a small amount after a feeding.  Babies often swallow air during feeding. This can make babies fussy. Burping your baby between breasts can help with this.  Vitamin D supplements are recommended for babies who get only breast milk.  Avoid using a pacifier during your baby's first 4 6 weeks.  Avoid supplemental feedings of water, formula, or   juice in place of breastfeeding. Breast milk is all the food your baby needs. It is not necessary for your baby to have water or formula. Your breasts will make more milk if supplemental feedings are avoided during the early weeks. HOW TO TELL WHETHER YOUR BABY IS GETTING ENOUGH BREAST  MILK Wondering whether or not your baby is getting enough milk is a common concern among mothers. You can be assured that your baby is getting enough milk if:   Your baby is actively sucking and you hear swallowing.   Your baby seems relaxed and satisfied after a feeding.   Your baby nurses at least 8 12 times in a 24 hour time period.  During the first 3 5 days of age:  Your baby is wetting at least 3 5 diapers in a 24 hour period. The urine should be clear and pale yellow.  Your baby is having at least 3 4 stools in a 24 hour period. The stool should be soft and yellow.  At 5 7 days of age, your baby is having at least 3 6 stools in a 24 hour period. The stool should be seedy and yellow by 5 days of age.  Your baby has a weight loss less than 7 10% during the first 3 days of age.  Your baby does not lose weight after 3 7 days of age.  Your baby gains 4 7 ounces each week after he or she is 4 days of age.  Your baby gains weight by 5 days of age and is back to birth weight within 2 weeks. ENGORGEMENT In the first week after your baby is born, you may experience extremely full breasts (engorgement). When engorged, your breasts may feel heavy, warm, or tender to the touch. Engorgement peaks within 24 48 hours after delivery of your baby.  Engorgement may be reduced by:  Continuing to breastfeed.  Increasing the frequency of breastfeeding.  Taking warm showers or applying warm, moist heat to your breasts just before each feeding. This increases circulation and helps the milk flow.   Gently massaging your breast before and during the feedings. With your fingertips, massage from your chest wall towards your nipple in a circular motion.   Ensuring that your baby empties at least one breast at every feeding. It also helps to start the next feeding on the opposite breast.   Expressing breast milk by hand or by using a breast pump to empty the breasts if your baby is sleepy, or  not nursing well. You may also want to express milk if you are returning to work oryou feel you are getting engorged.  Ensuring your baby is latched on and positioned properly while breastfeeding. If you follow these suggestions, your engorgement should improve in 24 48 hours. If you are still experiencing difficulty, call your lactation consultant or caregiver.  CARING FOR YOURSELF Take care of your breasts.  Bathe or shower daily.   Avoid using soap on your nipples.   Wear a supportive bra. Avoid wearing underwire style bras.  Air dry your nipples for a 3 4minutes after each feeding.   Use only cotton bra pads to absorb breast milk leakage. Leaking of breast milk between feedings is normal.   Use only pure lanolin on your nipples after nursing. You do not need to wash it off before feeding your baby again. Another option is to express a few drops of breast milk and gently massage that milk into your nipples.  Continue   breast self-awareness checks. Take care of yourself.  Eat healthy foods. Alternate 3 meals with 3 snacks.  Avoid foods that you notice affect your baby in a bad way.  Drink milk, fruit juice, and water to satisfy your thirst (about 8 glasses a day).   Rest often, relax, and take your prenatal vitamins to prevent fatigue, stress, and anemia.  Avoid chewing and smoking tobacco.  Avoid alcohol and drug use.  Take over-the-counter and prescribed medicine only as directed by your caregiver or pharmacist. You should always check with your caregiver or pharmacist before taking any new medicine, vitamin, or herbal supplement.  Know that pregnancy is possible while breastfeeding. If desired, talk to your caregiver about family planning and safe birth control methods that may be used while breastfeeding. SEEK MEDICAL CARE IF:   You feel like you want to stop breastfeeding or have become frustrated with breastfeeding.  You have painful breasts or nipples.  Your  nipples are cracked or bleeding.  Your breasts are red, tender, or warm.  You have a swollen area on either breast.  You have a fever or chills.  You have nausea or vomiting.  You have drainage from your nipples.  Your breasts do not become full before feedings by the 5th day after delivery.  You feel sad and depressed.  Your baby is too sleepy to eat well.  Your baby is having trouble sleeping.   Your baby is wetting less than 3 diapers in a 24 hour period.  Your baby has less than 3 stools in a 24 hour period.  Your baby's skin or the white part of his or her eyes becomes more yellow.   Your baby is not gaining weight by 5 days of age. MAKE SURE YOU:   Understand these instructions.  Will watch your condition.  Will get help right away if you are not doing well or get worse. Document Released: 06/03/2005 Document Revised: 02/26/2012 Document Reviewed: 01/08/2012 [employer] Patient Information 2014 [employer].  

## 2013-03-09 NOTE — Progress Notes (Signed)
Doing well 

## 2013-03-09 NOTE — Progress Notes (Signed)
P - [id number] 

## 2013-03-19 ENCOUNTER — Ambulatory Visit (HOSPITAL_COMMUNITY)
Admission: RE | Admit: 2013-03-19 | Discharge: 2013-03-19 | Disposition: A | Discharge status: Home / Self Care | Payer: Medicaid Other | Source: Ambulatory Visit | Attending: Family Medicine | Admitting: Family Medicine

## 2013-03-19 ENCOUNTER — Other Ambulatory Visit: Payer: Self-pay | Admitting: Family Medicine

## 2013-03-19 DIAGNOSIS — Z3402 Encounter for supervision of normal first pregnancy, second trimester: Secondary | ICD-10-CM

## 2013-03-19 DIAGNOSIS — Z1389 Encounter for screening for other disorder: Secondary | ICD-10-CM | POA: Insufficient documentation

## 2013-03-19 DIAGNOSIS — Z363 Encounter for antenatal screening for malformations: Secondary | ICD-10-CM | POA: Insufficient documentation

## 2013-04-02 ENCOUNTER — Ambulatory Visit (INDEPENDENT_AMBULATORY_CARE_PROVIDER_SITE_OTHER): Payer: Medicaid Other | Admitting: Family Medicine

## 2013-04-02 VITALS — BP 122/71 | Wt 149.0 lb

## 2013-04-02 DIAGNOSIS — Z3402 Encounter for supervision of normal first pregnancy, second trimester: Secondary | ICD-10-CM

## 2013-04-02 DIAGNOSIS — Z3400 Encounter for supervision of normal first pregnancy, unspecified trimester: Secondary | ICD-10-CM

## 2013-04-02 DIAGNOSIS — Z23 Encounter for immunization: Secondary | ICD-10-CM

## 2013-04-02 NOTE — Progress Notes (Signed)
Feeling good FM.  nml anatomy but needs f/u to complete-will schedule.

## 2013-04-02 NOTE — Patient Instructions (Signed)
Pregnancy - Second Trimester The second trimester of pregnancy (3 to 6 months) is a period of rapid growth for you and your baby. At the end of the sixth month, your baby is about 9 inches long and weighs 1 1/2 pounds. You will begin to feel the baby move between 18 and 20 weeks of the pregnancy. This is called quickening. Weight gain is faster. A clear fluid (colostrum) may leak out of your breasts. You may feel small contractions of the womb (uterus). This is known as false labor or Braxton-Hicks contractions. This is like a practice for labor when the baby is ready to be born. Usually, the problems with morning sickness have usually passed by the end of your first trimester. Some women develop small dark blotches (called cholasma, mask of pregnancy) on their face that usually goes away after the baby is born. Exposure to the sun makes the blotches worse. Acne may also develop in some pregnant women and pregnant women who have acne, may find that it goes away. PRENATAL EXAMS  Blood work may continue to be done during prenatal exams. These tests are done to check on your health and the probable health of your baby. Blood work is used to follow your blood levels (hemoglobin). Anemia (low hemoglobin) is common during pregnancy. Iron and vitamins are given to help prevent this. You will also be checked for diabetes between 24 and 28 weeks of the pregnancy. Some of the previous blood tests may be repeated.  The size of the uterus is measured during each visit. This is to make sure that the baby is continuing to grow properly according to the dates of the pregnancy.  Your blood pressure is checked every prenatal visit. This is to make sure you are not getting toxemia.  Your urine is checked to make sure you do not have an infection, diabetes or protein in the urine.  Your weight is checked often to make sure gains are happening at the suggested rate. This is to ensure that both you and your baby are  growing normally.  Sometimes, an ultrasound is performed to confirm the proper growth and development of the baby. This is a test which bounces harmless sound waves off the baby so your caregiver can more accurately determine due dates. Sometimes, a test is done on the amniotic fluid surrounding the baby. This test is called an amniocentesis. The amniotic fluid is obtained by sticking a needle into the belly (abdomen). This is done to check the chromosomes in instances where there is a concern about possible genetic problems with the baby. It is also sometimes done near the end of pregnancy if an early delivery is required. In this case, it is done to help make sure the baby's lungs are mature enough for the baby to live outside of the womb. CHANGES OCCURING IN THE SECOND TRIMESTER OF PREGNANCY Your body goes through many changes during pregnancy. They vary from person to person. Talk to your caregiver about changes you notice that you are concerned about.  During the second trimester, you will likely have an increase in your appetite. It is normal to have cravings for certain foods. This varies from person to person and pregnancy to pregnancy.  Your lower abdomen will begin to bulge.  You may have to urinate more often because the uterus and baby are pressing on your bladder. It is also common to get more bladder infections during pregnancy. You can help this by drinking lots of fluids   and emptying your bladder before and after intercourse.  You may begin to get stretch marks on your hips, abdomen, and breasts. These are normal changes in the body during pregnancy. There are no exercises or medicines to take that prevent this change.  You may begin to develop swollen and bulging veins (varicose veins) in your legs. Wearing support hose, elevating your feet for 15 minutes, 3 to 4 times a day and limiting salt in your diet helps lessen the problem.  Heartburn may develop as the uterus grows and  pushes up against the stomach. Antacids recommended by your caregiver helps with this problem. Also, eating smaller meals 4 to 5 times a day helps.  Constipation can be treated with a stool softener or adding bulk to your diet. Drinking lots of fluids, and eating vegetables, fruits, and whole grains are helpful.  Exercising is also helpful. If you have been very active up until your pregnancy, most of these activities can be continued during your pregnancy. If you have been less active, it is helpful to start an exercise program such as walking.  Hemorrhoids may develop at the end of the second trimester. Warm sitz baths and hemorrhoid cream recommended by your caregiver helps hemorrhoid problems.  Backaches may develop during this time of your pregnancy. Avoid heavy lifting, wear low heal shoes, and practice good posture to help with backache problems.  Some pregnant women develop tingling and numbness of their hand and fingers because of swelling and tightening of ligaments in the wrist (carpel tunnel syndrome). This goes away after the baby is born.  As your breasts enlarge, you may have to get a bigger bra. Get a comfortable, cotton, support bra. Do not get a nursing bra until the last month of the pregnancy if you will be nursing the baby.  You may get a dark line from your belly button to the pubic area called the linea nigra.  You may develop rosy cheeks because of increase blood flow to the face.  You may develop spider looking lines of the face, neck, arms, and chest. These go away after the baby is born. HOME CARE INSTRUCTIONS   It is extremely important to avoid all smoking, herbs, alcohol, and unprescribed drugs during your pregnancy. These chemicals affect the formation and growth of the baby. Avoid these chemicals throughout the pregnancy to ensure the delivery of a healthy infant.  Most of your home care instructions are the same as suggested for the first trimester of your  pregnancy. Keep your caregiver's appointments. Follow your caregiver's instructions regarding medicine use, exercise, and diet.  During pregnancy, you are providing food for you and your baby. Continue to eat regular, well-balanced meals. Choose foods such as meat, fish, milk and other low fat dairy products, vegetables, fruits, and whole-grain breads and cereals. Your caregiver will tell you of the ideal weight gain.  A physical sexual relationship may be continued up until near the end of pregnancy if there are no other problems. Problems could include early (premature) leaking of amniotic fluid from the membranes, vaginal bleeding, abdominal pain, or other medical or pregnancy problems.  Exercise regularly if there are no restrictions. Check with your caregiver if you are unsure of the safety of some of your exercises. The greatest weight gain will occur in the last 2 trimesters of pregnancy. Exercise will help you:  Control your weight.  Get you in shape for labor and delivery.  Lose weight after you have the baby.  Wear   a good support or jogging bra for breast tenderness during pregnancy. This may help if worn during sleep. Pads or tissues may be used in the bra if you are leaking colostrum.  Do not use hot tubs, steam rooms or saunas throughout the pregnancy.  Wear your seat belt at all times when driving. This protects you and your baby if you are in an accident.  Avoid raw meat, uncooked cheese, cat litter boxes, and soil used by cats. These carry germs that can cause birth defects in the baby.  The second trimester is also a good time to visit your dentist for your dental health if this has not been done yet. Getting your teeth cleaned is okay. Use a soft toothbrush. Brush gently during pregnancy.  It is easier to leak urine during pregnancy. Tightening up and strengthening the pelvic muscles will help with this problem. Practice stopping your urination while you are going to the  bathroom. These are the same muscles you need to strengthen. It is also the muscles you would use as if you were trying to stop from passing gas. You can practice tightening these muscles up 10 times a set and repeating this about 3 times per day. Once you know what muscles to tighten up, do not perform these exercises during urination. It is more likely to contribute to an infection by backing up the urine.  Ask for help if you have financial, counseling, or nutritional needs during pregnancy. Your caregiver will be able to offer counseling for these needs as well as refer you for other special needs.  Your skin may become oily. If so, wash your face with mild soap, use non-greasy moisturizer and oil or cream based makeup. MEDICINES AND DRUG USE IN PREGNANCY  Take prenatal vitamins as directed. The vitamin should contain 1 milligram of folic acid. Keep all vitamins out of reach of children. Only a couple vitamins or tablets containing iron may be fatal to a baby or young child when ingested.  Avoid use of all medicines, including herbs, over-the-counter medicines, not prescribed or suggested by your caregiver. Only take over-the-counter or prescription medicines for pain, discomfort, or fever as directed by your caregiver. Do not use aspirin.  Let your caregiver also know about herbs you may be using.  Alcohol is related to a number of birth defects. This includes fetal alcohol syndrome. All alcohol, in any form, should be avoided completely. Smoking will cause low birth rate and premature babies.  Street or illegal drugs are very harmful to the baby. They are absolutely forbidden. A baby born to an addicted mother will be addicted at birth. The baby will go through the same withdrawal an adult does. SEEK MEDICAL CARE IF:  You have any concerns or worries during your pregnancy. It is better to call with your questions if you feel they cannot wait, rather than worry about them. SEEK IMMEDIATE  MEDICAL CARE IF:   An unexplained oral temperature above 102 F (38.9 C) develops, or as your caregiver suggests.  You have leaking of fluid from the vagina (birth canal). If leaking membranes are suspected, take your temperature and tell your caregiver of this when you call.  There is vaginal spotting, bleeding, or passing clots. Tell your caregiver of the amount and how many pads are used. Light spotting in pregnancy is common, especially following intercourse.  You develop a bad smelling vaginal discharge with a change in the color from clear to white.  You continue to feel   sick to your stomach (nauseated) and have no relief from remedies suggested. You vomit blood or coffee ground-like materials.  You lose more than 2 pounds of weight or gain more than 2 pounds of weight over 1 week, or as suggested by your caregiver.  You notice swelling of your face, hands, feet, or legs.  You get exposed to German measles and have never had them.  You are exposed to fifth disease or chickenpox.  You develop belly (abdominal) pain. Round ligament discomfort is a common non-cancerous (benign) cause of abdominal pain in pregnancy. Your caregiver still must evaluate you.  You develop a bad headache that does not go away.  You develop fever, diarrhea, pain with urination, or shortness of breath.  You develop visual problems, blurry, or double vision.  You fall or are in a car accident or any kind of trauma.  There is mental or physical violence at home. Document Released: 05/28/2001 Document Revised: 02/26/2012 Document Reviewed: 11/30/2008 [employer] Patient Information 2014 [employer].  Breastfeeding A change in hormones during your pregnancy causes growth of your breast tissue and an increase in number and size of milk ducts. The hormone prolactin allows proteins, sugars, and fats from your blood supply to make breast milk in your milk-producing glands. The hormone progesterone prevents  breast milk from being released before the birth of your baby. After the birth of your baby, your progesterone level decreases allowing breast milk to be released. Thoughts of your baby, as well as his or her sucking or crying, can stimulate the release of milk from the milk-producing glands. Deciding to breastfeed (nurse) is one of the best choices you can make for you and your baby. The information that follows gives a brief review of the benefits, as well as other important skills to know about breastfeeding. BENEFITS OF BREASTFEEDING For your baby  The first milk (colostrum) helps your baby's digestive system function better.   There are antibodies in your milk that help your baby fight off infections.   Your baby has a lower incidence of asthma, allergies, and sudden infant death syndrome (SIDS).   The nutrients in breast milk are better for your baby than infant formulas.  Breast milk improves your baby's brain development.   Your baby will have less gas, colic, and constipation.  Your baby is less likely to develop other conditions, such as childhood obesity, asthma, or diabetes mellitus. For you  Breastfeeding helps develop a very special bond between you and your baby.   Breastfeeding is convenient, always available at the correct temperature, and costs nothing.   Breastfeeding helps to burn calories and helps you lose the weight gained during pregnancy.   Breastfeeding makes your uterus contract back down to normal size faster and slows bleeding following delivery.   Breastfeeding mothers have a lower risk of developing osteoporosis or breast or ovarian cancer later in life.  BREASTFEEDING FREQUENCY  A healthy, full-term baby may breastfeed as often as every hour or space his or her feedings to every 3 hours. Breastfeeding frequency will vary from baby to baby.   Newborns should be fed no less than every 2 3 hours during the day and every 4 5 hours during the  night. You should breastfeed a minimum of 8 feedings in a 24 hour period.  Awaken your baby to breastfeed if it has been 3 4 hours since the last feeding.  Breastfeed when you feel the need to reduce the fullness of your breasts or when   your newborn shows signs of hunger. Signs that your baby may be hungry include:  Increased alertness or activity.  Stretching.  Movement of the head from side to side.  Movement of the head and opening of the mouth when the corner of the mouth or cheek is stroked (rooting).  Increased sucking sounds, smacking lips, cooing, sighing, or squeaking.  Hand-to-mouth movements.  Increased sucking of fingers or hands.  Fussing.  Intermittent crying.  Signs of extreme hunger will require calming and consoling before you try to feed your baby. Signs of extreme hunger may include:  Restlessness.  A loud, strong cry.  Screaming.  Frequent feeding will help you make more milk and will help prevent problems, such as sore nipples and engorgement of the breasts.  BREASTFEEDING   Whether lying down or sitting, be sure that the baby's abdomen is facing your abdomen.   Support your breast with 4 fingers under your breast and your thumb above your nipple. Make sure your fingers are well away from your nipple and your baby's mouth.   Stroke your baby's lips gently with your finger or nipple.   When your baby's mouth is open wide enough, place all of your nipple and as much of the colored area around your nipple (areola) as possible into your baby's mouth.  More areola should be visible above his or her upper lip than below his or her lower lip.  Your baby's tongue should be between his or her lower gum and your breast.  Ensure that your baby's mouth is correctly positioned around the nipple (latched). Your baby's lips should create a seal on your breast.  Signs that your baby has effectively latched onto your nipple include:  Tugging or sucking  without pain.  Swallowing heard between sucks.  Absent click or smacking sound.  Muscle movement above and in front of his or her ears with sucking.  Your baby must suck about 2 3 minutes in order to get your milk. Allow your baby to feed on each breast as long as he or she wants. Nurse your baby until he or she unlatches or falls asleep at the first breast, then offer the second breast.  Signs that your baby is full and satisfied include:  A gradual decrease in the number of sucks or complete cessation of sucking.  Falling asleep.  Extension or relaxation of his or her body.  Retention of a small amount of milk in his or her mouth.  Letting go of your breast by himself or herself.  Signs of effective breastfeeding in you include:  Breasts that have increased firmness, weight, and size prior to feeding.  Breasts that are softer after nursing.  Increased milk volume, as well as a change in milk consistency and color by the 5th day of breastfeeding.  Breast fullness relieved by breastfeeding.  Nipples are not sore, cracked, or bleeding.  If needed, break the suction by putting your finger into the corner of your baby's mouth and sliding your finger between his or her gums. Then, remove your breast from his or her mouth.  It is common for babies to spit up a small amount after a feeding.  Babies often swallow air during feeding. This can make babies fussy. Burping your baby between breasts can help with this.  Vitamin D supplements are recommended for babies who get only breast milk.  Avoid using a pacifier during your baby's first 4 6 weeks.  Avoid supplemental feedings of water, formula, or   juice in place of breastfeeding. Breast milk is all the food your baby needs. It is not necessary for your baby to have water or formula. Your breasts will make more milk if supplemental feedings are avoided during the early weeks. HOW TO TELL WHETHER YOUR BABY IS GETTING ENOUGH BREAST  MILK Wondering whether or not your baby is getting enough milk is a common concern among mothers. You can be assured that your baby is getting enough milk if:   Your baby is actively sucking and you hear swallowing.   Your baby seems relaxed and satisfied after a feeding.   Your baby nurses at least 8 12 times in a 24 hour time period.  During the first 3 5 days of age:  Your baby is wetting at least 3 5 diapers in a 24 hour period. The urine should be clear and pale yellow.  Your baby is having at least 3 4 stools in a 24 hour period. The stool should be soft and yellow.  At 5 7 days of age, your baby is having at least 3 6 stools in a 24 hour period. The stool should be seedy and yellow by 5 days of age.  Your baby has a weight loss less than 7 10% during the first 3 days of age.  Your baby does not lose weight after 3 7 days of age.  Your baby gains 4 7 ounces each week after he or she is 4 days of age.  Your baby gains weight by 5 days of age and is back to birth weight within 2 weeks. ENGORGEMENT In the first week after your baby is born, you may experience extremely full breasts (engorgement). When engorged, your breasts may feel heavy, warm, or tender to the touch. Engorgement peaks within 24 48 hours after delivery of your baby.  Engorgement may be reduced by:  Continuing to breastfeed.  Increasing the frequency of breastfeeding.  Taking warm showers or applying warm, moist heat to your breasts just before each feeding. This increases circulation and helps the milk flow.   Gently massaging your breast before and during the feedings. With your fingertips, massage from your chest wall towards your nipple in a circular motion.   Ensuring that your baby empties at least one breast at every feeding. It also helps to start the next feeding on the opposite breast.   Expressing breast milk by hand or by using a breast pump to empty the breasts if your baby is sleepy, or  not nursing well. You may also want to express milk if you are returning to work oryou feel you are getting engorged.  Ensuring your baby is latched on and positioned properly while breastfeeding. If you follow these suggestions, your engorgement should improve in 24 48 hours. If you are still experiencing difficulty, call your lactation consultant or caregiver.  CARING FOR YOURSELF Take care of your breasts.  Bathe or shower daily.   Avoid using soap on your nipples.   Wear a supportive bra. Avoid wearing underwire style bras.  Air dry your nipples for a 3 4minutes after each feeding.   Use only cotton bra pads to absorb breast milk leakage. Leaking of breast milk between feedings is normal.   Use only pure lanolin on your nipples after nursing. You do not need to wash it off before feeding your baby again. Another option is to express a few drops of breast milk and gently massage that milk into your nipples.  Continue   breast self-awareness checks. Take care of yourself.  Eat healthy foods. Alternate 3 meals with 3 snacks.  Avoid foods that you notice affect your baby in a bad way.  Drink milk, fruit juice, and water to satisfy your thirst (about 8 glasses a day).   Rest often, relax, and take your prenatal vitamins to prevent fatigue, stress, and anemia.  Avoid chewing and smoking tobacco.  Avoid alcohol and drug use.  Take over-the-counter and prescribed medicine only as directed by your caregiver or pharmacist. You should always check with your caregiver or pharmacist before taking any new medicine, vitamin, or herbal supplement.  Know that pregnancy is possible while breastfeeding. If desired, talk to your caregiver about family planning and safe birth control methods that may be used while breastfeeding. SEEK MEDICAL CARE IF:   You feel like you want to stop breastfeeding or have become frustrated with breastfeeding.  You have painful breasts or nipples.  Your  nipples are cracked or bleeding.  Your breasts are red, tender, or warm.  You have a swollen area on either breast.  You have a fever or chills.  You have nausea or vomiting.  You have drainage from your nipples.  Your breasts do not become full before feedings by the 5th day after delivery.  You feel sad and depressed.  Your baby is too sleepy to eat well.  Your baby is having trouble sleeping.   Your baby is wetting less than 3 diapers in a 24 hour period.  Your baby has less than 3 stools in a 24 hour period.  Your baby's skin or the white part of his or her eyes becomes more yellow.   Your baby is not gaining weight by 5 days of age. MAKE SURE YOU:   Understand these instructions.  Will watch your condition.  Will get help right away if you are not doing well or get worse. Document Released: 06/03/2005 Document Revised: 02/26/2012 Document Reviewed: 01/08/2012 [employer] Patient Information 2014 [employer].  

## 2013-04-02 NOTE — Progress Notes (Signed)
P-78 

## 2013-04-05 ENCOUNTER — Ambulatory Visit (HOSPITAL_COMMUNITY)
Admission: RE | Admit: 2013-04-05 | Discharge: 2013-04-05 | Disposition: A | Discharge status: Home / Self Care | Payer: Medicaid Other | Source: Ambulatory Visit | Attending: Family Medicine | Admitting: Family Medicine

## 2013-04-05 ENCOUNTER — Other Ambulatory Visit: Payer: Self-pay | Admitting: Family Medicine

## 2013-04-05 ENCOUNTER — Other Ambulatory Visit (HOSPITAL_COMMUNITY): Payer: Self-pay | Admitting: Family Medicine

## 2013-04-05 DIAGNOSIS — O99330 Smoking (tobacco) complicating pregnancy, unspecified trimester: Secondary | ICD-10-CM | POA: Insufficient documentation

## 2013-04-05 DIAGNOSIS — Z3402 Encounter for supervision of normal first pregnancy, second trimester: Secondary | ICD-10-CM

## 2013-04-05 DIAGNOSIS — Z3689 Encounter for other specified antenatal screening: Secondary | ICD-10-CM | POA: Insufficient documentation

## 2013-04-08 ENCOUNTER — Ambulatory Visit (INDEPENDENT_AMBULATORY_CARE_PROVIDER_SITE_OTHER): Payer: Medicaid Other | Admitting: Emergency Medicine

## 2013-04-08 ENCOUNTER — Encounter: Payer: Self-pay | Admitting: Emergency Medicine

## 2013-04-08 VITALS — BP 116/74 | HR 87 | Temp 98.3°F | Wt 153.0 lb

## 2013-04-08 DIAGNOSIS — H9201 Otalgia, right ear: Secondary | ICD-10-CM

## 2013-04-08 DIAGNOSIS — H9209 Otalgia, unspecified ear: Secondary | ICD-10-CM | POA: Insufficient documentation

## 2013-04-08 NOTE — Assessment & Plan Note (Signed)
Likely secondary to broken tooth.  Possible component of TMJ pain. TM is normal.  No signs of infection. Treat with tylenol prn.  Follow up with dentist when able.

## 2013-04-08 NOTE — Progress Notes (Signed)
  Subjective:    Patient ID: Maureen Perez, female    DOB: 11-24-1985, 27 y.o.   MRN: [id number]  HPI [name] is here for right ear pain.  She reports that her right ear has been intermittently throbbing for the last week.  Just before this started, she developed a broken tooth (the posterior right upper molar).  She has not been able to see a dentist yet.  No fevers, change in hearing, drainage from the ear.  She states the pain is getting worse.  It is worse after chewing.  Taking tylenol with some relief.  She has a history of frequent ear infections as a child and had tubes placed.  Also she is pregnant.  I have reviewed and updated the following as appropriate: allergies and current medications SHx: current smoker  Review of Systems See HPI    Objective:   Physical Exam BP 116/74  Pulse 87  Temp(Src) 98.3 F (36.8 C) (Oral)  Wt 153 lb (69.4 kg)  BMI 28.92 kg/m2  LMP 10/15/2012 Gen: alert, cooperative, NAD HEENT: AT/[place], sclera white, MMM, poor dentition, no erythema, tenderness, or swelling to suggest infection, right upper posterior molar broken; bilateral TMs with some scarring but no erythema or pus; very tender over right TMJ      Assessment & Plan:

## 2013-04-14 ENCOUNTER — Emergency Department (HOSPITAL_COMMUNITY)
Admission: EM | Admit: 2013-04-14 | Discharge: 2013-04-14 | Disposition: A | Discharge status: Home / Self Care | Payer: Medicaid Other | Attending: Emergency Medicine | Admitting: Emergency Medicine

## 2013-04-14 ENCOUNTER — Encounter (HOSPITAL_COMMUNITY): Payer: Self-pay | Admitting: Emergency Medicine

## 2013-04-14 DIAGNOSIS — O9989 Other specified diseases and conditions complicating pregnancy, childbirth and the puerperium: Secondary | ICD-10-CM | POA: Insufficient documentation

## 2013-04-14 DIAGNOSIS — K029 Dental caries, unspecified: Secondary | ICD-10-CM | POA: Insufficient documentation

## 2013-04-14 DIAGNOSIS — Z8659 Personal history of other mental and behavioral disorders: Secondary | ICD-10-CM | POA: Insufficient documentation

## 2013-04-14 DIAGNOSIS — IMO0002 Reserved for concepts with insufficient information to code with codable children: Secondary | ICD-10-CM | POA: Insufficient documentation

## 2013-04-14 DIAGNOSIS — Z87891 Personal history of nicotine dependence: Secondary | ICD-10-CM | POA: Insufficient documentation

## 2013-04-14 DIAGNOSIS — J309 Allergic rhinitis, unspecified: Secondary | ICD-10-CM

## 2013-04-14 DIAGNOSIS — E669 Obesity, unspecified: Secondary | ICD-10-CM | POA: Insufficient documentation

## 2013-04-14 DIAGNOSIS — H9209 Otalgia, unspecified ear: Secondary | ICD-10-CM | POA: Insufficient documentation

## 2013-04-14 DIAGNOSIS — Z79899 Other long term (current) drug therapy: Secondary | ICD-10-CM | POA: Insufficient documentation

## 2013-04-14 DIAGNOSIS — Z87828 Personal history of other (healed) physical injury and trauma: Secondary | ICD-10-CM | POA: Insufficient documentation

## 2013-04-14 DIAGNOSIS — H9201 Otalgia, right ear: Secondary | ICD-10-CM

## 2013-04-14 MED ORDER — CETIRIZINE HCL 10 MG PO TABS: 10.0000 mg | ORAL_TABLET | Freq: Every day | ORAL | Status: DC

## 2013-04-14 MED ORDER — FLUTICASONE PROPIONATE 50 MCG/ACT NA SUSP: 2.0000 | Freq: Every day | NASAL | Status: DC

## 2013-04-14 NOTE — ED Notes (Signed)
Pt. reports right ear ache for 1 week with drainage , denies injury , no hearing loss.

## 2013-04-14 NOTE — ED Provider Notes (Signed)
CSN: [id number]     Arrival date & time 04/14/13  2022 History  This chart was scribed for non-physician practitioner Dierdre Forth, PA-C working with Audree Camel, MD by Danella Maiers, ED Scribe. This patient was seen in room TR04C/TR04C and the patient's care was started at 10:08 PM.   Chief Complaint  Patient presents with  . Otalgia   The history is provided by the patient. No language interpreter was used.   HPI Comments: Maureen Perez is a 27 y.o. female who is [redacted] weeks pregnant who presents to the Emergency Department complaining of worsening right otalgia for the past week with possible drainage. She was running a fever of 101 last night and this morning, relieved by tylenol, but no fever here in the ED. She used to have tubes in her ears. She went to the doctor one week ago and was told she had some white spots, but he did not know what it was. She was not given any antibiotics. She also reports nasal congestion and rhinorrhea secondary to allergies and has not taken any medications for it.  Past Medical History  Diagnosis Date  . Back pain   . Obesity   . Depression     no meds since pregancny  . Abnormal Pap smear     repeat WNL  . PONV (postoperative nausea and vomiting)   . Injury of tendon of left rotator cuff    Past Surgical History  Procedure Laterality Date  . Tubes in ears  age 57  . Tonsillectomy      age 46   Family History  Problem Relation Age of Onset  . Depression Mother   . Diabetes Mother   . Hypertension Mother   . COPD Mother   . Anesthesia problems Mother   . Depression Sister   . Diabetes Sister   . Kidney disease Sister   . Hypertension Sister   . Hypertension Father   . Anesthesia problems Father    History  Substance Use Topics  . Smoking status: Former Smoker -- 0.25 packs/day for 6 years    Types: Cigarettes  . Smokeless tobacco: Former Neurosurgeon    Quit date: 12/10/2012  . Alcohol Use: No   OB History   Grav Para Term  Preterm Abortions TAB SAB Ect Mult Living   3 1 1  0 1 0 1 0 0 1     Review of Systems  Constitutional: Negative for fever, diaphoresis, appetite change, fatigue and unexpected weight change.  HENT: Positive for ear pain, rhinorrhea and sinus pressure. Negative for mouth sores.   Eyes: Negative for visual disturbance.  Respiratory: Negative for cough, chest tightness, shortness of breath and wheezing.   Cardiovascular: Negative for chest pain.  Gastrointestinal: Negative for nausea, vomiting, abdominal pain, diarrhea and constipation.  Endocrine: Negative for polydipsia, polyphagia and polyuria.  Genitourinary: Negative for dysuria, urgency, frequency and hematuria.  Musculoskeletal: Negative for back pain and neck stiffness.  Skin: Negative for rash.  Allergic/Immunologic: Negative for immunocompromised state.  Neurological: Negative for syncope, light-headedness and headaches.  Hematological: Does not bruise/bleed easily.  Psychiatric/Behavioral: Negative for sleep disturbance. The patient is not nervous/anxious.     Allergies  Cephalexin and Percocet  Home Medications   Current Outpatient Rx  Name  Route  Sig  Dispense  Refill  . acetaminophen (TYLENOL) 325 MG tablet   Oral   Take 650 mg by mouth every 6 (six) hours as needed for pain or fever.         Marland Kitchen  Prenatal Vit-Fe Fumarate-FA (MULTIVITAMIN-PRENATAL) 27-0.8 MG TABS   Oral   Take 1 tablet by mouth daily at 12 noon.         . cetirizine (ZYRTEC ALLERGY) 10 MG tablet   Oral   Take 1 tablet (10 mg total) by mouth daily.   30 tablet   1   . fluticasone (FLONASE) 50 MCG/ACT nasal spray   Nasal   Place 2 sprays into the nose daily.   16 g   0    BP 125/77  Pulse 96  Temp(Src) 98.2 F (36.8 C) (Oral)  Resp 16  Wt 155 lb 1.6 oz (70.353 kg)  BMI 29.32 kg/m2  SpO2 99%  LMP 10/15/2012 Physical Exam  Nursing note and vitals reviewed. Constitutional: She is oriented to person, place, and time. She appears  well-developed and well-nourished. No distress.  Awake, alert, nontoxic appearance  HENT:  Head: Normocephalic and atraumatic.  Right Ear: External ear and ear canal normal. Tympanic membrane is scarred.  Left Ear: External ear and ear canal normal. Tympanic membrane is scarred.  Nose: Mucosal edema and rhinorrhea present. No epistaxis. Right sinus exhibits no maxillary sinus tenderness and no frontal sinus tenderness. Left sinus exhibits no maxillary sinus tenderness and no frontal sinus tenderness.  Mouth/Throat: Uvula is midline, oropharynx is clear and moist and mucous membranes are normal. Mucous membranes are not pale and not cyanotic. Dental caries (throughout) present. No oropharyngeal exudate, posterior oropharyngeal edema, posterior oropharyngeal erythema or tonsillar abscesses.  Tonsils absent Clear fluid noted behind bilateral TMs - no erythema or purulent fluid; no discharge from either ear; clear canals   Eyes: Conjunctivae are normal. Pupils are equal, round, and reactive to light. No scleral icterus.  Neck: Normal range of motion and full passive range of motion without pain. Neck supple.  Cardiovascular: Normal rate, regular rhythm, S1 normal, S2 normal, normal heart sounds and intact distal pulses.   Pulses:      Radial pulses are 2+ on the right side, and 2+ on the left side.  Regular rate and rhythm  Pulmonary/Chest: Effort normal and breath sounds normal. No stridor. No respiratory distress. She has no decreased breath sounds. She has no wheezes. She has no rhonchi. She has no rales.  Clear and equal breath sounds  Abdominal: Soft. Bowel sounds are normal. There is no tenderness. There is no guarding.  Gravid uterus  Musculoskeletal: Normal range of motion. She exhibits no edema.  Lymphadenopathy:       Head (right side): Submandibular, tonsillar and posterior auricular adenopathy present.       Head (left side): Submandibular and tonsillar adenopathy present.    She has  no cervical adenopathy.       Right cervical: No superficial cervical, no deep cervical and no posterior cervical adenopathy present.      Left cervical: No superficial cervical, no deep cervical and no posterior cervical adenopathy present.  Neurological: She is alert and oriented to person, place, and time.  Speech is clear and goal oriented Moves extremities without ataxia  Skin: Skin is warm and dry. No rash noted. She is not diaphoretic.  Psychiatric: She has a normal mood and affect.    ED Course  Procedures (including critical care time) Medications - No data to display  DIAGNOSTIC STUDIES: Oxygen Saturation is 99% on RA, normal by my interpretation.    COORDINATION OF CARE: 10:22 PM- Discussed treatment plan with pt which includes Zyrtec and Flonase. Pt agrees to plan.  Labs Review Labs Reviewed - No data to display Imaging Review No results found.  EKG Interpretation   None       MDM   1. Allergic rhinitis   2. Otalgia of right ear      Maureen Perez presents with right otalgia and clear rhinorrhea.  Patient with allergic rhinitis. On exam no evidence of otitis media or otitis externa. Clear fluid noted behind TMs bilaterally likely secondary to patient's rhinorrhea. Discussed with pharmacy who recommends a Zyrtec and Flonase which they have confirmed her blood safe for pregnancy. Will prescribe those and have her followup with her primary care physician.  It has been determined that no acute conditions requiring further emergency intervention are present at this time. The patient/guardian have been advised of the diagnosis and plan. We have discussed signs and symptoms that warrant return to the ED, such as changes or worsening in symptoms.   Vital signs are stable at discharge.   BP 125/77  Pulse 96  Temp(Src) 98.2 F (36.8 C) (Oral)  Resp 16  Wt 155 lb 1.6 oz (70.353 kg)  BMI 29.32 kg/m2  SpO2 99%  LMP 10/15/2012  Patient/guardian has voiced  understanding and agreed to follow-up with the PCP or specialist.    I personally performed the services described in this documentation, which was scribed in my presence. The recorded information has been reviewed and is accurate.   Dahlia Client Alexandre Faries, PA-C 04/14/13 [id number]

## 2013-04-14 NOTE — ED Notes (Signed)
Patient is alert and orientedx4.  Patient was explained discharge instructions and they understood them with no questions.   

## 2013-04-16 NOTE — ED Provider Notes (Signed)
Medical screening examination/treatment/procedure(s) were performed by non-physician practitioner and as supervising physician I was immediately available for consultation/collaboration.  EKG Interpretation   None         Frazier Balfour T Juandiego Kolenovic, MD 04/16/13 1714 

## 2013-04-30 ENCOUNTER — Encounter: Payer: Medicaid Other | Admitting: Obstetrics & Gynecology

## 2013-05-03 ENCOUNTER — Encounter: Payer: Self-pay | Admitting: Obstetrics & Gynecology

## 2013-05-03 ENCOUNTER — Ambulatory Visit (INDEPENDENT_AMBULATORY_CARE_PROVIDER_SITE_OTHER): Payer: Medicaid Other | Admitting: Obstetrics & Gynecology

## 2013-05-03 VITALS — BP 127/73 | Wt 154.0 lb

## 2013-05-03 DIAGNOSIS — Z3402 Encounter for supervision of normal first pregnancy, second trimester: Secondary | ICD-10-CM

## 2013-05-03 DIAGNOSIS — O99334 Smoking (tobacco) complicating childbirth: Secondary | ICD-10-CM

## 2013-05-03 DIAGNOSIS — O9989 Other specified diseases and conditions complicating pregnancy, childbirth and the puerperium: Secondary | ICD-10-CM

## 2013-05-03 DIAGNOSIS — O99332 Smoking (tobacco) complicating pregnancy, second trimester: Secondary | ICD-10-CM

## 2013-05-03 DIAGNOSIS — Z283 Underimmunization status: Secondary | ICD-10-CM

## 2013-05-03 DIAGNOSIS — Z3400 Encounter for supervision of normal first pregnancy, unspecified trimester: Secondary | ICD-10-CM

## 2013-05-03 NOTE — Progress Notes (Signed)
Patient stopped smoking, congratulated her on this achievement. Third trimester labs next visit.  Counseled about Tdap, will receive next visit.  No other complaints or concerns.  Fetal movement and labor precautions reviewed.

## 2013-05-03 NOTE — Progress Notes (Signed)
P-85 

## 2013-05-03 NOTE — Patient Instructions (Addendum)
Return to clinic for any obstetric concerns or go to MAU for evaluation  Tetanus, Diphtheria, Pertussis (Tdap) Vaccine What You Need to Know WHY GET VACCINATED? Tetanus, diphtheria and pertussis can be very serious diseases, even for adolescents and adults. Tdap vaccine can protect [place] from these diseases. TETANUS (Lockjaw) causes painful muscle tightening and stiffness, usually all over the body.  It can lead to tightening of muscles in the head and neck so you can't open your mouth, swallow, or sometimes even breathe. Tetanus kills about 1 out of 5 people who are infected. DIPHTHERIA can cause a thick coating to form in the back of the throat.  It can lead to breathing problems, paralysis, heart failure, and death. PERTUSSIS (Whooping Cough) causes severe coughing spells, which can cause difficulty breathing, vomiting and disturbed sleep.  It can also lead to weight loss, incontinence, and rib fractures. Up to 2 in 100 adolescents and 5 in 100 adults with pertussis are hospitalized or have complications, which could include pneumonia and death. These diseases are caused by bacteria. Diphtheria and pertussis are spread from person to person through coughing or sneezing. Tetanus enters the body through cuts, scratches, or wounds. Before vaccines, the [place] saw as many as 200,000 cases a year of diphtheria and pertussis, and hundreds of cases of tetanus. Since vaccination began, tetanus and diphtheria have dropped by about 99% and pertussis by about 80%. TDAP VACCINE Tdap vaccine can protect adolescents and adults from tetanus, diphtheria, and pertussis. One dose of Tdap is routinely given at age 51 or 32. People who did not get Tdap at that age should get it as soon as possible. Tdap is especially important for health care professionals and anyone having close contact with a baby younger than 12 months. Pregnant women should get a dose of Tdap during every pregnancy, to protect the newborn  from pertussis. Infants are most at risk for severe, life-threatening complications from pertussis. A similar vaccine, called Td, protects from tetanus and diphtheria, but not pertussis. A Td booster should be given every 10 years. Tdap may be given as one of these boosters if you have not already gotten a dose. Tdap may also be given after a severe cut or burn to prevent tetanus infection. Your doctor can give you more information. Tdap may safely be given at the same time as other vaccines. SOME PEOPLE SHOULD NOT GET THIS VACCINE  If you ever had a life-threatening allergic reaction after a dose of any tetanus, diphtheria, or pertussis containing vaccine, OR if you have a severe allergy to any part of this vaccine, you should not get Tdap. Tell your doctor if you have any severe allergies.  If you had a coma, or long or multiple seizures within 7 days after a childhood dose of DTP or DTaP, you should not get Tdap, unless a cause other than the vaccine was found. You can still get Td.  Talk to your doctor if you:  have epilepsy or another nervous system problem,  had severe pain or swelling after any vaccine containing diphtheria, tetanus or pertussis,  ever had Guillain-Barr Syndrome (GBS),  aren't feeling well on the day the shot is scheduled. RISKS OF A VACCINE REACTION With any medicine, including vaccines, there is a chance of side effects. These are usually mild and go away on their own, but serious reactions are also possible. Brief fainting spells can follow a vaccination, leading to injuries from falling. Sitting or lying down for about 15 minutes  can help prevent these. Tell your doctor if you feel dizzy or light-headed, or have vision changes or ringing in the ears. Mild problems following Tdap (Did not interfere with activities)  Pain where the shot was given (about 3 in 4 adolescents or 2 in 3 adults)  Redness or swelling where the shot was given (about 1 person in 5)  Mild  fever of at least 100.31F (up to about 1 in 25 adolescents or 1 in 100 adults)  Headache (about 3 or 4 people in 10)  Tiredness (about 1 person in 3 or 4)  Nausea, vomiting, diarrhea, stomach ache (up to 1 in 4 adolescents or 1 in 10 adults)  Chills, body aches, sore joints, rash, swollen glands (uncommon) Moderate problems following Tdap (Interfered with activities, but did not require medical attention)  Pain where the shot was given (about 1 in 5 adolescents or 1 in 100 adults)  Redness or swelling where the shot was given (up to about 1 in 16 adolescents or 1 in 25 adults)  Fever over 102F (about 1 in 100 adolescents or 1 in 250 adults)  Headache (about 3 in 20 adolescents or 1 in 10 adults)  Nausea, vomiting, diarrhea, stomach ache (up to 1 or 3 people in 100)  Swelling of the entire arm where the shot was given (up to about 3 in 100). Severe problems following Tdap (Unable to perform usual activities, required medical attention)  Swelling, severe pain, bleeding and redness in the arm where the shot was given (rare). A severe allergic reaction could occur after any vaccine (estimated less than 1 in a million doses). WHAT IF THERE IS A SERIOUS REACTION? What should I look for?  Look for anything that concerns you, such as signs of a severe allergic reaction, very high fever, or behavior changes. Signs of a severe allergic reaction can include hives, swelling of the face and throat, difficulty breathing, a fast heartbeat, dizziness, and weakness. These would start a few minutes to a few hours after the vaccination. What should I do?  If you think it is a severe allergic reaction or other emergency that can't wait, call 9-1-1 or get the person to the nearest hospital. Otherwise, call your doctor.  Afterward, the reaction should be reported to the "Vaccine Adverse Event Reporting System" (VAERS). Your doctor might file this report, or you can do it yourself through the VAERS web  site at www.vaers.LAgents.no, or by calling 1-575-410-8547. VAERS is only for reporting reactions. They do not give medical advice.  THE NATIONAL VACCINE INJURY COMPENSATION PROGRAM The National Vaccine Injury Compensation Program (VICP) is a federal program that was created to compensate people who may have been injured by certain vaccines. Persons who believe they may have been injured by a vaccine can learn about the program and about filing a claim by calling 1-339-748-1801 or visiting the VICP website at SpiritualWord.at. HOW CAN I LEARN MORE?  Ask your doctor.  Call your local or state health department.  Contact the Centers for Disease Control and Prevention (CDC):  Call 418 249 7029 or visit CDC's website at PicCapture.uy. CDC Tdap Vaccine VIS (10/24/11) Document Released: 12/03/2011 Document Revised: 09/28/2012 Document Reviewed: 09/23/2012 [facility] Patient Information 2014 [place], [place].   Third Trimester of Pregnancy The third trimester is from week 29 through week 42, months 7 through 9. The third trimester is a time when the fetus is growing rapidly. At the end of the ninth month, the fetus is about 20 inches in length  and weighs 6 10 pounds.  BODY CHANGES Your body goes through many changes during pregnancy. The changes vary from woman to woman.   Your weight will continue to increase. You can expect to gain 25 35 pounds (11 16 kg) by the end of the pregnancy.  You may begin to get stretch marks on your hips, abdomen, and breasts.  You may urinate more often because the fetus is moving lower into your pelvis and pressing on your bladder.  You may develop or continue to have heartburn as a result of your pregnancy.  You may develop constipation because certain hormones are causing the muscles that push waste through your intestines to slow down.  You may develop hemorrhoids or swollen, bulging veins (varicose veins).  You may have pelvic pain  because of the weight gain and pregnancy hormones relaxing your joints between the bones in your pelvis. Back aches may result from over exertion of the muscles supporting your posture.  Your breasts will continue to grow and be tender. A yellow discharge may leak from your breasts called colostrum.  Your belly button may stick out.  You may feel short of breath because of your expanding uterus.  You may notice the fetus "dropping," or moving lower in your abdomen.  You may have a bloody mucus discharge. This usually occurs a few days to a week before labor begins.  Your cervix becomes thin and soft (effaced) near your due date. WHAT TO EXPECT AT YOUR PRENATAL EXAMS  You will have prenatal exams every 2 weeks until week 36. Then, you will have weekly prenatal exams. During a routine prenatal visit:  You will be weighed to make sure you and the fetus are growing normally.  Your blood pressure is taken.  Your abdomen will be measured to track your baby's growth.  The fetal heartbeat will be listened to.  Any test results from the previous visit will be discussed.  You may have a cervical check near your due date to see if you have effaced. At around 36 weeks, your caregiver will check your cervix. At the same time, your caregiver will also perform a test on the secretions of the vaginal tissue. This test is to determine if a type of bacteria, Group B streptococcus, is present. Your caregiver will explain this further. Your caregiver may ask you:  What your birth plan is.  How you are feeling.  If you are feeling the baby move.  If you have had any abnormal symptoms, such as leaking fluid, bleeding, severe headaches, or abdominal cramping.  If you have any questions. Other tests or screenings that may be performed during your third trimester include:  Blood tests that check for low iron levels (anemia).  Fetal testing to check the health, activity level, and growth of the  fetus. Testing is done if you have certain medical conditions or if there are problems during the pregnancy. FALSE LABOR You may feel small, irregular contractions that eventually go away. These are called Braxton Hicks contractions, or false labor. Contractions may last for hours, days, or even weeks before true labor sets in. If contractions come at regular intervals, intensify, or become painful, it is best to be seen by your caregiver.  SIGNS OF LABOR   Menstrual-like cramps.  Contractions that are 5 minutes apart or less.  Contractions that start on the top of the uterus and spread down to the lower abdomen and back.  A sense of increased pelvic pressure or back  pain.  A watery or bloody mucus discharge that comes from the vagina. If you have any of these signs before the 37th week of pregnancy, call your caregiver right away. You need to go to the hospital to get checked immediately. HOME CARE INSTRUCTIONS   Avoid all smoking, herbs, alcohol, and unprescribed drugs. These chemicals affect the formation and growth of the baby.  Follow your caregiver's instructions regarding medicine use. There are medicines that are either safe or unsafe to take during pregnancy.  Exercise only as directed by your caregiver. Experiencing uterine cramps is a good sign to stop exercising.  Continue to eat regular, healthy meals.  Wear a good support bra for breast tenderness.  Do not use hot tubs, steam rooms, or saunas.  Wear your seat belt at all times when driving.  Avoid raw meat, uncooked cheese, cat litter boxes, and soil used by cats. These carry germs that can cause birth defects in the baby.  Take your prenatal vitamins.  Try taking a stool softener (if your caregiver approves) if you develop constipation. Eat more high-fiber foods, such as fresh vegetables or fruit and whole grains. Drink plenty of fluids to keep your urine clear or pale yellow.  Take warm sitz baths to soothe any  pain or discomfort caused by hemorrhoids. Use hemorrhoid cream if your caregiver approves.  If you develop varicose veins, wear support hose. Elevate your feet for 15 minutes, 3 4 times a day. Limit salt in your diet.  Avoid heavy lifting, wear low heal shoes, and practice good posture.  Rest a lot with your legs elevated if you have leg cramps or low back pain.  Visit your dentist if you have not gone during your pregnancy. Use a soft toothbrush to brush your teeth and be gentle when you floss.  A sexual relationship may be continued unless your caregiver directs you otherwise.  Do not travel far distances unless it is absolutely necessary and only with the approval of your caregiver.  Take prenatal classes to understand, practice, and ask questions about the labor and delivery.  Make a trial run to the hospital.  Pack your hospital bag.  Prepare the baby's nursery.  Continue to go to all your prenatal visits as directed by your caregiver. SEEK MEDICAL CARE IF:  You are unsure if you are in labor or if your water has broken.  You have dizziness.  You have mild pelvic cramps, pelvic pressure, or nagging pain in your abdominal area.  You have persistent nausea, vomiting, or diarrhea.  You have a bad smelling vaginal discharge.  You have pain with urination. SEEK IMMEDIATE MEDICAL CARE IF:   You have a fever.  You are leaking fluid from your vagina.  You have spotting or bleeding from your vagina.  You have severe abdominal cramping or pain.  You have rapid weight loss or gain.  You have shortness of breath with chest pain.  You notice sudden or extreme swelling of your face, hands, ankles, feet, or legs.  You have not felt your baby move in over an hour.  You have severe headaches that do not go away with medicine.  You have vision changes. Document Released: 05/28/2001 Document Revised: 02/03/2013 Document Reviewed: 08/04/2012 [facility] Patient Information  2014 [place], [place].

## 2013-05-15 ENCOUNTER — Encounter (HOSPITAL_COMMUNITY): Payer: Self-pay | Admitting: General Practice

## 2013-05-15 ENCOUNTER — Inpatient Hospital Stay (HOSPITAL_COMMUNITY)
Admission: AD | Admit: 2013-05-15 | Discharge: 2013-05-15 | Disposition: A | Discharge status: Home / Self Care | Payer: Medicaid Other | Source: Ambulatory Visit | Attending: Obstetrics & Gynecology | Admitting: Obstetrics & Gynecology

## 2013-05-15 DIAGNOSIS — O99891 Other specified diseases and conditions complicating pregnancy: Secondary | ICD-10-CM | POA: Insufficient documentation

## 2013-05-15 DIAGNOSIS — R109 Unspecified abdominal pain: Secondary | ICD-10-CM | POA: Insufficient documentation

## 2013-05-15 LAB — URINE MICROSCOPIC-ADD ON

## 2013-05-15 LAB — URINALYSIS, ROUTINE W REFLEX MICROSCOPIC
Bilirubin Urine: NEGATIVE
Hgb urine dipstick: NEGATIVE
Nitrite: NEGATIVE
Protein, ur: NEGATIVE mg/dL
Urobilinogen, UA: 0.2 mg/dL (ref 0.0–1.0)

## 2013-05-15 LAB — WET PREP, GENITAL: Clue Cells Wet Prep HPF POC: NONE SEEN

## 2013-05-15 NOTE — MAU Provider Note (Signed)
S: c/o low abd pain since this afternoon. Constant in nature. O: VSS, SVE cl/th/post/high. No uc's on EFM or palpated. A: common discomfort in pregnancy. P: d/c home.

## 2013-05-15 NOTE — MAU Note (Signed)
Pt states here for lower abdominal cramping/pressure that began today around 1100. Denies bleeding or vaginal discharge changes. Denies uti s/s, no problems with pregnancy thus far.

## 2013-05-17 ENCOUNTER — Encounter (HOSPITAL_COMMUNITY): Payer: Self-pay | Admitting: *Deleted

## 2013-05-17 ENCOUNTER — Inpatient Hospital Stay (HOSPITAL_COMMUNITY)
Admission: AD | Admit: 2013-05-17 | Discharge: 2013-05-17 | Disposition: A | Discharge status: Home / Self Care | Payer: Medicaid Other | Source: Ambulatory Visit | Attending: Obstetrics & Gynecology | Admitting: Obstetrics & Gynecology

## 2013-05-17 DIAGNOSIS — IMO0002 Reserved for concepts with insufficient information to code with codable children: Secondary | ICD-10-CM | POA: Insufficient documentation

## 2013-05-17 DIAGNOSIS — R05 Cough: Secondary | ICD-10-CM | POA: Insufficient documentation

## 2013-05-17 DIAGNOSIS — J069 Acute upper respiratory infection, unspecified: Secondary | ICD-10-CM

## 2013-05-17 DIAGNOSIS — R509 Fever, unspecified: Secondary | ICD-10-CM | POA: Insufficient documentation

## 2013-05-17 DIAGNOSIS — R059 Cough, unspecified: Secondary | ICD-10-CM | POA: Insufficient documentation

## 2013-05-17 LAB — URINE CULTURE

## 2013-05-17 NOTE — MAU Provider Note (Signed)
History     CSN: [id number]  Arrival date and time: 05/17/13 2048   None     Chief Complaint  Patient presents with  . Fever  . Cough  . Fatigue   HPI  Maureen Perez is a 27 y.o. G3P1011 at [redacted]w[redacted]d complaining of a 1 day history of fatigue, malaise, cough productive of clear sputum, and a temperature of 74F.   Onset was gradual and severity is moderate. The cough preceded a sore throat. She has taken tylenol which has helped, but nothing else. She has been able to eat/drink normally. She denies HA except when she coughs. Denies diarrhea, constipation, RUQ/epigastric pain, swelling, vision changes.   She has been around a niece and nephew who are being treated for pneumonia.   + GFM, no VB, LOF, CTX's  She has had an uncomplicated pregnancy followed by [facility]. She now smokes about 1 cigarette per day (down from 1 ppd at beginning of pregnancy).   OB History   Grav Para Term Preterm Abortions TAB SAB Ect Mult Living   3 1 1  0 1 0 1 0 0 1      Past Medical History  Diagnosis Date  . Back pain   . Obesity   . Depression     no meds since pregancny  . Abnormal Pap smear     repeat WNL  . PONV (postoperative nausea and vomiting)   . Injury of tendon of left rotator cuff     Past Surgical History  Procedure Laterality Date  . Tubes in ears  age 42  . Tonsillectomy      age 73    Family History  Problem Relation Age of Onset  . Depression Mother   . Diabetes Mother   . Hypertension Mother   . COPD Mother   . Anesthesia problems Mother   . Depression Sister   . Diabetes Sister   . Kidney disease Sister   . Hypertension Sister   . Hypertension Father   . Anesthesia problems Father     History  Substance Use Topics  . Smoking status: Former Smoker -- 0.25 packs/day for 6 years    Types: Cigarettes  . Smokeless tobacco: Former Neurosurgeon    Quit date: 12/10/2012  . Alcohol Use: No    Allergies:  Allergies  Allergen Reactions  . Cephalexin Itching  and Nausea And Vomiting  . Percocet [Oxycodone-Acetaminophen] Nausea And Vomiting    Prescriptions prior to admission  Medication Sig Dispense Refill  . acetaminophen (TYLENOL) 325 MG tablet Take 650 mg by mouth every 6 (six) hours as needed for pain or fever.      . cetirizine (ZYRTEC ALLERGY) 10 MG tablet Take 1 tablet (10 mg total) by mouth daily.  30 tablet  1  . fluticasone (FLONASE) 50 MCG/ACT nasal spray Place 2 sprays into the nose daily.  16 g  0  . Prenatal Vit-Fe Fumarate-FA (PRENATAL MULTIVITAMIN) TABS tablet Take 1 tablet by mouth daily at 12 noon.        ROS As per HPI. No weight loss/nausea, vomiting, diarrhea, constipation, otherwise negative.  Physical Exam   Blood pressure 107/61, pulse 80, temperature 98.1 F (36.7 C), temperature source Oral, resp. rate 18, height 5\' 1"  (1.549 m), weight 71.94 kg (158 lb 9.6 oz), last menstrual period 10/15/2012, SpO2 100.00%.  Physical Exam Gen: NCAT, NAD HEENT: oropharynx clear, no erythema/tonsilomegaly, poor dentition   CV: rrr, no M/R/G Pulm: CTAB bilaterally, nonlabored Abd: Soft,  NT, ND, gravid Ext: No edema, full AROM  MAU Course  Procedures  Assessment and Plan  [name] is a 27 y.o. G3P1011 at [redacted]w[redacted]d with likely viral respiratory tract infection.   - Continue adequate fluids - Mucinex prn cough - Tylenol prn pain/fever - Return to MAU if worsening at 7 days.  - Preterm labor precautions, kick counts.  - D/C home.    Hazeline Junker 05/17/2013, 9:52 PM   I have seen and examined this patient and agree with above documentation in the resident's note. Pt presented with viral symptoms for <1 day and congestion, cough.  Exam consistent with viral URI. Given information on medicines to use during pregnancy. Return precautions discussed.  FWB- cat I tracing.  Baseline 145, mod variability, +accels.  F/u as scheduled at [facility].   Rulon Abide, [name] [facility] Fellow 05/17/2013 10:43 PM

## 2013-05-17 NOTE — MAU Note (Signed)
Pt G3 P1 at 27wks with cough, chills and "not feeling very good."  Pt reports that she has been exposed to family with pnuemonia.

## 2013-05-17 NOTE — MAU Note (Signed)
Cough, chills, low grade fever 99.?, started today; pt reported taking tylenol ~1700 for fever.  Exposure to family with pneumonia over holiday.

## 2013-05-18 NOTE — MAU Provider Note (Signed)
Attestation of Attending Supervision of Obstetric Fellow: Evaluation and management procedures were performed by the Obstetric Fellow under my supervision and collaboration.  I have reviewed the Obstetric Fellow's note and chart, and I agree with the management and plan.  Lamark Schue, MD, FACOG Attending Obstetrician & Gynecologist Faculty Practice, [facility]   

## 2013-05-19 ENCOUNTER — Ambulatory Visit (INDEPENDENT_AMBULATORY_CARE_PROVIDER_SITE_OTHER): Payer: Medicaid Other | Admitting: Obstetrics and Gynecology

## 2013-05-19 ENCOUNTER — Encounter: Payer: Self-pay | Admitting: Obstetrics and Gynecology

## 2013-05-19 VITALS — BP 128/83 | Wt 156.0 lb

## 2013-05-19 DIAGNOSIS — Z3402 Encounter for supervision of normal first pregnancy, second trimester: Secondary | ICD-10-CM

## 2013-05-19 DIAGNOSIS — Z3400 Encounter for supervision of normal first pregnancy, unspecified trimester: Secondary | ICD-10-CM

## 2013-05-19 LAB — CBC
Hemoglobin: 11 g/dL — ABNORMAL LOW (ref 12.0–15.0)
MCHC: 34.7 g/dL (ref 30.0–36.0)
RBC: 3.7 MIL/uL — ABNORMAL LOW (ref 3.87–5.11)
WBC: 10.2 10*3/uL (ref 4.0–10.5)

## 2013-05-19 MED ORDER — TETANUS-DIPHTH-ACELL PERTUSSIS 5-2.5-18.5 LF-MCG/0.5 IM SUSP: 0.5000 mL | Freq: Once | INTRAMUSCULAR | Status: DC

## 2013-05-19 NOTE — Addendum Note (Signed)
Addended by: Tandy Gaw C on: 05/19/2013 10:22 AM   Modules accepted: Orders

## 2013-05-19 NOTE — Progress Notes (Signed)
Patient doing well without complaints. FM/PTL precautions reviewed. 1 hr GCT, tdap and labs today

## 2013-05-19 NOTE — Progress Notes (Signed)
P= 86  

## 2013-05-20 LAB — RPR

## 2013-05-20 LAB — GLUCOSE TOLERANCE, 1 HOUR (50G) W/O FASTING: Glucose, 1 Hour GTT: 121 mg/dL (ref 70–140)

## 2013-05-20 LAB — HIV ANTIBODY (ROUTINE TESTING W REFLEX): HIV: NONREACTIVE

## 2013-05-21 ENCOUNTER — Encounter: Payer: Self-pay | Admitting: Obstetrics and Gynecology

## 2013-06-01 ENCOUNTER — Ambulatory Visit (INDEPENDENT_AMBULATORY_CARE_PROVIDER_SITE_OTHER): Payer: Medicaid Other | Admitting: Obstetrics & Gynecology

## 2013-06-01 ENCOUNTER — Encounter: Payer: Self-pay | Admitting: Obstetrics & Gynecology

## 2013-06-01 VITALS — BP 125/72 | Wt 157.8 lb

## 2013-06-01 DIAGNOSIS — Z23 Encounter for immunization: Secondary | ICD-10-CM

## 2013-06-01 DIAGNOSIS — Z3403 Encounter for supervision of normal first pregnancy, third trimester: Secondary | ICD-10-CM

## 2013-06-01 DIAGNOSIS — O9989 Other specified diseases and conditions complicating pregnancy, childbirth and the puerperium: Secondary | ICD-10-CM

## 2013-06-01 DIAGNOSIS — Z3400 Encounter for supervision of normal first pregnancy, unspecified trimester: Secondary | ICD-10-CM

## 2013-06-01 DIAGNOSIS — N898 Other specified noninflammatory disorders of vagina: Secondary | ICD-10-CM

## 2013-06-01 NOTE — Progress Notes (Signed)
No pruritus noted with discharge; discharge noted for 3 days.  No bleeding or other symptoms.  White discharge seen on exam, sample for wet prep obtained.  No other complaints or concerns.  Fetal movement and labor precautions reviewed. Tdap vaccine today.

## 2013-06-01 NOTE — Progress Notes (Signed)
P-92  Patient is complaining of increased discharge that is greenish in color along with some watery discharge.  This started after a trip [place] lot of walking.

## 2013-06-01 NOTE — Patient Instructions (Addendum)
Return to clinic for any obstetric concerns or go to MAU for evaluation  Thank you for enrolling in MyChart. Please follow the instructions below to securely access your online medical record. MyChart allows you to send messages to your doctor, view your test results, manage appointments, and more.   How Do I Sign Up? 1. In your Internet browser, go to [employer] and enter [email or web address]. 2. Click on the Sign Up Now link in the Sign In box. You will see the New Member Sign Up page. 3. Enter your MyChart Access Code exactly as it appears below. You will not need to use this code after you've completed the sign-up process. If you do not sign up before the expiration date, you must request a new code.  MyChart Access Code: [email or web address] Expires: 06/01/2013 10:50 AM  4. Enter your Social Security Number ([id number]) and Date of Birth (mm/dd/yyyy) as indicated and click Submit. You will be taken to the next sign-up page. 5. Create a MyChart ID. This will be your MyChart login ID and cannot be changed, so think of one that is secure and easy to remember. 6. Create a MyChart password. You can change your password at any time. 7. Enter your Password Reset Question and Answer. This can be used at a later time if you forget your password.  8. Enter your e-mail address. You will receive e-mail notification when new information is available in MyChart. 9. Click Sign Up. You can now view your medical record.   Additional Information Remember, MyChart is NOT to be used for urgent needs. For medical emergencies, dial 911.

## 2013-06-02 LAB — WET PREP, GENITAL
Clue Cells Wet Prep HPF POC: NONE SEEN
Trich, Wet Prep: NONE SEEN
Yeast Wet Prep HPF POC: NONE SEEN

## 2013-06-02 NOTE — Progress Notes (Signed)
Patient notified of lab results

## 2013-06-04 ENCOUNTER — Encounter: Payer: Medicaid Other | Admitting: Family Medicine

## 2013-06-09 ENCOUNTER — Inpatient Hospital Stay (HOSPITAL_COMMUNITY)
Admission: AD | Admit: 2013-06-09 | Discharge: 2013-06-09 | Disposition: A | Discharge status: Home / Self Care | Payer: Medicaid Other | Source: Ambulatory Visit | Attending: Obstetrics & Gynecology | Admitting: Obstetrics & Gynecology

## 2013-06-09 ENCOUNTER — Encounter (HOSPITAL_COMMUNITY): Payer: Self-pay | Admitting: *Deleted

## 2013-06-09 DIAGNOSIS — R197 Diarrhea, unspecified: Secondary | ICD-10-CM | POA: Insufficient documentation

## 2013-06-09 DIAGNOSIS — O99891 Other specified diseases and conditions complicating pregnancy: Secondary | ICD-10-CM | POA: Insufficient documentation

## 2013-06-09 DIAGNOSIS — Z3403 Encounter for supervision of normal first pregnancy, third trimester: Secondary | ICD-10-CM

## 2013-06-09 DIAGNOSIS — O99334 Smoking (tobacco) complicating childbirth: Secondary | ICD-10-CM

## 2013-06-09 DIAGNOSIS — K529 Noninfective gastroenteritis and colitis, unspecified: Secondary | ICD-10-CM

## 2013-06-09 DIAGNOSIS — K5289 Other specified noninfective gastroenteritis and colitis: Secondary | ICD-10-CM | POA: Insufficient documentation

## 2013-06-09 DIAGNOSIS — O212 Late vomiting of pregnancy: Secondary | ICD-10-CM | POA: Insufficient documentation

## 2013-06-09 DIAGNOSIS — Z283 Underimmunization status: Secondary | ICD-10-CM

## 2013-06-09 DIAGNOSIS — O99332 Smoking (tobacco) complicating pregnancy, second trimester: Secondary | ICD-10-CM

## 2013-06-09 LAB — URINALYSIS, ROUTINE W REFLEX MICROSCOPIC
Bilirubin Urine: NEGATIVE
Ketones, ur: 15 mg/dL — AB
Nitrite: NEGATIVE
Protein, ur: NEGATIVE mg/dL
Urobilinogen, UA: 1 mg/dL (ref 0.0–1.0)

## 2013-06-09 MED ORDER — ONDANSETRON HCL 4 MG PO TABS: 4.0000 mg | ORAL_TABLET | Freq: Four times a day (QID) | ORAL | Status: DC

## 2013-06-09 NOTE — MAU Note (Signed)
Pt states it started yesterday.Pt states she has vomited several times and it "seems like every time i eat or drink i vomit"

## 2013-06-09 NOTE — MAU Provider Note (Signed)
  History   CSN: [id number]  Arrival date and time: 06/09/13 1659   None     Chief Complaint  Patient presents with  . Nausea  . Emesis  . Diarrhea   HPI  Pt presents with c/o nausea and emesis with meals only and diarrhea.  She reports sick contacts at home with the same issue.  She denies contractions, LOF or vaginal bleeding.  She also denies fever or chills.  She reports that her sx have been present for 2 days.      Past Medical History  Diagnosis Date  . Back pain   . Obesity   . Depression     no meds since pregancny  . Abnormal Pap smear     repeat WNL  . PONV (postoperative nausea and vomiting)   . Injury of tendon of left rotator cuff     Past Surgical History  Procedure Laterality Date  . Tubes in ears  age 27  . Tonsillectomy      age 9    Family History  Problem Relation Age of Onset  . Depression Mother   . Diabetes Mother   . Hypertension Mother   . COPD Mother   . Anesthesia problems Mother   . Depression Sister   . Diabetes Sister   . Kidney disease Sister   . Hypertension Sister   . Hypertension Father   . Anesthesia problems Father     History  Substance Use Topics  . Smoking status: Former Smoker -- 0.25 packs/day for 6 years    Types: Cigarettes  . Smokeless tobacco: Former Neurosurgeon    Quit date: 12/10/2012  . Alcohol Use: No    Allergies:  Allergies  Allergen Reactions  . Cephalexin Itching and Nausea And Vomiting  . Percocet [Oxycodone-Acetaminophen] Nausea And Vomiting    Facility-administered medications prior to admission  Medication Dose Route Frequency Provider Last Rate Last Dose  . Tdap (BOOSTRIX) injection 0.5 mL  0.5 mL Intramuscular Once Catalina Antigua, MD       Prescriptions prior to admission  Medication Sig Dispense Refill  . cetirizine (ZYRTEC ALLERGY) 10 MG tablet Take 1 tablet (10 mg total) by mouth daily.  30 tablet  1  . fluticasone (FLONASE) 50 MCG/ACT nasal spray Place 2 sprays into the nose daily.  16  g  0  . Prenatal Vit-Fe Fumarate-FA (PRENATAL MULTIVITAMIN) TABS tablet Take 1 tablet by mouth daily at 12 noon.      Marland Kitchen acetaminophen (TYLENOL) 325 MG tablet Take 650 mg by mouth every 6 (six) hours as needed for pain or fever.        ROS Physical Exam   Blood pressure 97/61, pulse 92, temperature 97.8 F (36.6 C), temperature source Oral, resp. rate 16, last menstrual period 10/15/2012.  Physical Exam Pt in NAD BP 97/61  Pulse 92  Temp(Src) 97.8 F (36.6 C) (Oral)  Resp 16  LMP 10/15/2012 HEENT: poor dentition Abd: gravid; NT, ND FHR: Cat I; +BTBV; + accels no decels    Urine dipstick shows positive for ketones.  Micro exam: contaminated specimen.   MAU Course  Procedures NST; UA    Assessment and Plan  Pt with gastroenteritis presumed viral.   Discharge to home F/u prn Zofran prior to meals Keep routine OB visit   HARRAWAY-SMITH, Sani Loiseau 06/09/2013, 6:30 PM

## 2013-06-09 NOTE — MAU Note (Signed)
Vomiting and diarrhea  Started yesterday, folks at home also sick. Has continued today, was afraid she might be getting dehydrated, can't keep anything down.

## 2013-06-15 ENCOUNTER — Ambulatory Visit (INDEPENDENT_AMBULATORY_CARE_PROVIDER_SITE_OTHER): Payer: Medicaid Other | Admitting: Family Medicine

## 2013-06-15 ENCOUNTER — Encounter: Payer: Self-pay | Admitting: Family Medicine

## 2013-06-15 VITALS — BP 132/69 | Wt 157.0 lb

## 2013-06-15 DIAGNOSIS — K219 Gastro-esophageal reflux disease without esophagitis: Secondary | ICD-10-CM

## 2013-06-15 DIAGNOSIS — Z3403 Encounter for supervision of normal first pregnancy, third trimester: Secondary | ICD-10-CM

## 2013-06-15 DIAGNOSIS — Z3400 Encounter for supervision of normal first pregnancy, unspecified trimester: Secondary | ICD-10-CM

## 2013-06-15 MED ORDER — OMEPRAZOLE MAGNESIUM 20 MG PO TBEC: 20.0000 mg | DELAYED_RELEASE_TABLET | Freq: Every day | ORAL | Status: DC

## 2013-06-15 NOTE — Progress Notes (Signed)
P=[id number] 

## 2013-06-15 NOTE — Patient Instructions (Addendum)
Third Trimester of Pregnancy The third trimester is from week 29 through week 42, months 7 through 9. The third trimester is a time when the fetus is growing rapidly. At the end of the ninth month, the fetus is about 20 inches in length and weighs 6 10 pounds.  BODY CHANGES Your body goes through many changes during pregnancy. The changes vary from woman to woman.   Your weight will continue to increase. You can expect to gain 25 35 pounds (11 16 kg) by the end of the pregnancy.  You may begin to get stretch marks on your hips, abdomen, and breasts.  You may urinate more often because the fetus is moving lower into your pelvis and pressing on your bladder.  You may develop or continue to have heartburn as a result of your pregnancy.  You may develop constipation because certain hormones are causing the muscles that push waste through your intestines to slow down.  You may develop hemorrhoids or swollen, bulging veins (varicose veins).  You may have pelvic pain because of the weight gain and pregnancy hormones relaxing your joints between the bones in your pelvis. Back aches may result from over exertion of the muscles supporting your posture.  Your breasts will continue to grow and be tender. A yellow discharge may leak from your breasts called colostrum.  Your belly button may stick out.  You may feel short of breath because of your expanding uterus.  You may notice the fetus "dropping," or moving lower in your abdomen.  You may have a bloody mucus discharge. This usually occurs a few days to a week before labor begins.  Your cervix becomes thin and soft (effaced) near your due date. WHAT TO EXPECT AT YOUR PRENATAL EXAMS  You will have prenatal exams every 2 weeks until week 36. Then, you will have weekly prenatal exams. During a routine prenatal visit:  You will be weighed to make sure you and the fetus are growing normally.  Your blood pressure is taken.  Your abdomen will  be measured to track your baby's growth.  The fetal heartbeat will be listened to.  Any test results from the previous visit will be discussed.  You may have a cervical check near your due date to see if you have effaced. At around 36 weeks, your caregiver will check your cervix. At the same time, your caregiver will also perform a test on the secretions of the vaginal tissue. This test is to determine if a type of bacteria, Group B streptococcus, is present. Your caregiver will explain this further. Your caregiver may ask you:  What your birth plan is.  How you are feeling.  If you are feeling the baby move.  If you have had any abnormal symptoms, such as leaking fluid, bleeding, severe headaches, or abdominal cramping.  If you have any questions. Other tests or screenings that may be performed during your third trimester include:  Blood tests that check for low iron levels (anemia).  Fetal testing to check the health, activity level, and growth of the fetus. Testing is done if you have certain medical conditions or if there are problems during the pregnancy. FALSE LABOR You may feel small, irregular contractions that eventually go away. These are called Braxton Hicks contractions, or false labor. Contractions may last for hours, days, or even weeks before true labor sets in. If contractions come at regular intervals, intensify, or become painful, it is best to be seen by your caregiver.    SIGNS OF LABOR   Menstrual-like cramps.  Contractions that are 5 minutes apart or less.  Contractions that start on the top of the uterus and spread down to the lower abdomen and back.  A sense of increased pelvic pressure or back pain.  A watery or bloody mucus discharge that comes from the vagina. If you have any of these signs before the 37th week of pregnancy, call your caregiver right away. You need to go to the hospital to get checked immediately. HOME CARE INSTRUCTIONS   Avoid all  smoking, herbs, alcohol, and unprescribed drugs. These chemicals affect the formation and growth of the baby.  Follow your caregiver's instructions regarding medicine use. There are medicines that are either safe or unsafe to take during pregnancy.  Exercise only as directed by your caregiver. Experiencing uterine cramps is a good sign to stop exercising.  Continue to eat regular, healthy meals.  Wear a good support bra for breast tenderness.  Do not use hot tubs, steam rooms, or saunas.  Wear your seat belt at all times when driving.  Avoid raw meat, uncooked cheese, cat litter boxes, and soil used by cats. These carry germs that can cause birth defects in the baby.  Take your prenatal vitamins.  Try taking a stool softener (if your caregiver approves) if you develop constipation. Eat more high-fiber foods, such as fresh vegetables or fruit and whole grains. Drink plenty of fluids to keep your urine clear or pale yellow.  Take warm sitz baths to soothe any pain or discomfort caused by hemorrhoids. Use hemorrhoid cream if your caregiver approves.  If you develop varicose veins, wear support hose. Elevate your feet for 15 minutes, 3 4 times a day. Limit salt in your diet.  Avoid heavy lifting, wear low heal shoes, and practice good posture.  Rest a lot with your legs elevated if you have leg cramps or low back pain.  Visit your dentist if you have not gone during your pregnancy. Use a soft toothbrush to brush your teeth and be gentle when you floss.  A sexual relationship may be continued unless your caregiver directs you otherwise.  Do not travel far distances unless it is absolutely necessary and only with the approval of your caregiver.  Take prenatal classes to understand, practice, and ask questions about the labor and delivery.  Make a trial run to the hospital.  Pack your hospital bag.  Prepare the baby's nursery.  Continue to go to all your prenatal visits as directed  by your caregiver. SEEK MEDICAL CARE IF:  You are unsure if you are in labor or if your water has broken.  You have dizziness.  You have mild pelvic cramps, pelvic pressure, or nagging pain in your abdominal area.  You have persistent nausea, vomiting, or diarrhea.  You have a bad smelling vaginal discharge.  You have pain with urination. SEEK IMMEDIATE MEDICAL CARE IF:   You have a fever.  You are leaking fluid from your vagina.  You have spotting or bleeding from your vagina.  You have severe abdominal cramping or pain.  You have rapid weight loss or gain.  You have shortness of breath with chest pain.  You notice sudden or extreme swelling of your face, hands, ankles, feet, or legs.  You have not felt your baby move in over an hour.  You have severe headaches that do not go away with medicine.  You have vision changes. Document Released: 05/28/2001 Document Revised: 02/03/2013 Document Reviewed:   08/04/2012 [employer] Patient Information 2014 [employer].  Breastfeeding Deciding to breastfeed is one of the best choices you can make for you and your baby. A change in hormones during pregnancy causes your breast tissue to grow and increases the number and size of your milk ducts. These hormones also allow proteins, sugars, and fats from your blood supply to make breast milk in your milk-producing glands. Hormones prevent breast milk from being released before your baby is born as well as prompt milk flow after birth. Once breastfeeding has begun, thoughts of your baby, as well as his or her sucking or crying, can stimulate the release of milk from your milk-producing glands.  BENEFITS OF BREASTFEEDING For Your Baby  Your first milk (colostrum) helps your baby's digestive system function better.   There are antibodies in your milk that help your baby fight off infections.   Your baby has a lower incidence of asthma, allergies, and sudden infant death syndrome.    The nutrients in breast milk are better for your baby than infant formulas and are designed uniquely for your baby's needs.   Breast milk improves your baby's brain development.   Your baby is less likely to develop other conditions, such as childhood obesity, asthma, or type 2 diabetes mellitus.  For You   Breastfeeding helps to create a very special bond between you and your baby.   Breastfeeding is convenient. Breast milk is always available at the correct temperature and costs nothing.   Breastfeeding helps to burn calories and helps you lose the weight gained during pregnancy.   Breastfeeding makes your uterus contract to its prepregnancy size faster and slows bleeding (lochia) after you give birth.   Breastfeeding helps to lower your risk of developing type 2 diabetes mellitus, osteoporosis, and breast or ovarian cancer later in life. SIGNS THAT YOUR BABY IS HUNGRY Early Signs of Hunger  Increased alertness or activity.  Stretching.  Movement of the head from side to side.  Movement of the head and opening of the mouth when the corner of the mouth or cheek is stroked (rooting).  Increased sucking sounds, smacking lips, cooing, sighing, or squeaking.  Hand-to-mouth movements.  Increased sucking of fingers or hands. Late Signs of Hunger  Fussing.  Intermittent crying. Extreme Signs of Hunger Signs of extreme hunger will require calming and consoling before your baby will be able to breastfeed successfully. Do not wait for the following signs of extreme hunger to occur before you initiate breastfeeding:   Restlessness.  A loud, strong cry.   Screaming. BREASTFEEDING BASICS Breastfeeding Initiation  Find a comfortable place to sit or lie down, with your neck and back well supported.  Place a pillow or rolled up blanket under your baby to bring him or her to the level of your breast (if you are seated). Nursing pillows are specially designed to help  support your arms and your baby while you breastfeed.  Make sure that your baby's abdomen is facing your abdomen.   Gently massage your breast. With your fingertips, massage from your chest wall toward your nipple in a circular motion. This encourages milk flow. You may need to continue this action during the feeding if your milk flows slowly.  Support your breast with 4 fingers underneath and your thumb above your nipple. Make sure your fingers are well away from your nipple and your baby's mouth.   Stroke your baby's lips gently with your finger or nipple.   When your baby's mouth is   open wide enough, quickly bring your baby to your breast, placing your entire nipple and as much of the colored area around your nipple (areola) as possible into your baby's mouth.   More areola should be visible above your baby's upper lip than below the lower lip.   Your baby's tongue should be between his or her lower gum and your breast.   Ensure that your baby's mouth is correctly positioned around your nipple (latched). Your baby's lips should create a seal on your breast and be turned out (everted).  It is common for your baby to suck about 2 3 minutes in order to start the flow of breast milk. Latching Teaching your baby how to latch on to your breast properly is very important. An improper latch can cause nipple pain and decreased milk supply for you and poor weight gain in your baby. Also, if your baby is not latched onto your nipple properly, he or she may swallow some air during feeding. This can make your baby fussy. Burping your baby when you switch breasts during the feeding can help to get rid of the air. However, teaching your baby to latch on properly is still the best way to prevent fussiness from swallowing air while breastfeeding. Signs that your baby has successfully latched on to your nipple:    Silent tugging or silent sucking, without causing you pain.   Swallowing heard  between every 3 4 sucks.    Muscle movement above and in front of his or her ears while sucking.  Signs that your baby has not successfully latched on to nipple:   Sucking sounds or smacking sounds from your baby while breastfeeding.  Nipple pain. If you think your baby has not latched on correctly, slip your finger into the corner of your baby's mouth to break the suction and place it between your baby's gums. Attempt breastfeeding initiation again. Signs of Successful Breastfeeding Signs from your baby:   A gradual decrease in the number of sucks or complete cessation of sucking.   Falling asleep.   Relaxation of his or her body.   Retention of a small amount of milk in his or her mouth.   Letting go of your breast by himself or herself. Signs from you:  Breasts that have increased in firmness, weight, and size 1 3 hours after feeding.   Breasts that are softer immediately after breastfeeding.  Increased milk volume, as well as a change in milk consistency and color by the 5th day of breastfeeding.   Nipples that are not sore, cracked, or bleeding. Signs That Your Baby is Getting Enough Milk  Wetting at least 3 diapers in a 24-hour period. The urine should be clear and pale yellow by age 5 days.  At least 3 stools in a 24-hour period by age 5 days. The stool should be soft and yellow.  At least 3 stools in a 24-hour period by age 7 days. The stool should be seedy and yellow.  No loss of weight greater than 10% of birth weight during the first 3 days of age.  Average weight gain of 4 7 ounces (120 210 mL) per week after age 4 days.  Consistent daily weight gain by age 5 days, without weight loss after the age of 2 weeks. After a feeding, your baby may spit up a small amount. This is common. BREASTFEEDING FREQUENCY AND DURATION Frequent feeding will help you make more milk and can prevent sore nipples and breast engorgement.   Breastfeed when you feel the need to  reduce the fullness of your breasts or when your baby shows signs of hunger. This is called "breastfeeding on demand." Avoid introducing a pacifier to your baby while you are working to establish breastfeeding (the first 4 6 weeks after your baby is born). After this time you may choose to use a pacifier. Research has shown that pacifier use during the first year of a baby's life decreases the risk of sudden infant death syndrome (SIDS). Allow your baby to feed on each breast as long as he or she wants. Breastfeed until your baby is finished feeding. When your baby unlatches or falls asleep while feeding from the first breast, offer the second breast. Because newborns are often sleepy in the first few weeks of life, you may need to awaken your baby to get him or her to feed. Breastfeeding times will vary from baby to baby. However, the following rules can serve as a guide to help you ensure that your baby is properly fed:  Newborns (babies 4 weeks of age or younger) may breastfeed every 1 3 hours.  Newborns should not go longer than 3 hours during the day or 5 hours during the night without breastfeeding.  You should breastfeed your baby a minimum of 8 times in a 24-hour period until you begin to introduce solid foods to your baby at around 6 months of age. BREAST MILK PUMPING Pumping and storing breast milk allows you to ensure that your baby is exclusively fed your breast milk, even at times when you are unable to breastfeed. This is especially important if you are going back to work while you are still breastfeeding or when you are not able to be present during feedings. Your lactation consultant can give you guidelines on how long it is safe to store breast milk.  A breast pump is a machine that allows you to pump milk from your breast into a sterile bottle. The pumped breast milk can then be stored in a refrigerator or freezer. Some breast pumps are operated by hand, while others use electricity. Ask  your lactation consultant which type will work best for you. Breast pumps can be purchased, but some hospitals and breastfeeding support groups lease breast pumps on a monthly basis. A lactation consultant can teach you how to hand express breast milk, if you prefer not to use a pump.  CARING FOR YOUR BREASTS WHILE YOU BREASTFEED Nipples can become dry, cracked, and sore while breastfeeding. The following recommendations can help keep your breasts moisturized and healthy:  Avoid using soap on your nipples.   Wear a supportive bra. Although not required, special nursing bras and tank tops are designed to allow access to your breasts for breastfeeding without taking off your entire bra or top. Avoid wearing underwire style bras or extremely tight bras.  Air dry your nipples for 3 4minutes after each feeding.   Use only cotton bra pads to absorb leaked breast milk. Leaking of breast milk between feedings is normal.   Use lanolin on your nipples after breastfeeding. Lanolin helps to maintain your skin's normal moisture barrier. If you use pure lanolin you do not need to wash it off before feeding your baby again. Pure lanolin is not toxic to your baby. You may also hand express a few drops of breast milk and gently massage that milk into your nipples and allow the milk to air dry. In the first few weeks after giving birth, some women   experience extremely full breasts (engorgement). Engorgement can make your breasts feel heavy, warm, and tender to the touch. Engorgement peaks within 3 5 days after you give birth. The following recommendations can help ease engorgement:  Completely empty your breasts while breastfeeding or pumping. You may want to start by applying warm, moist heat (in the shower or with warm water-soaked hand towels) just before feeding or pumping. This increases circulation and helps the milk flow. If your baby does not completely empty your breasts while breastfeeding, pump any extra  milk after he or she is finished.  Wear a snug bra (nursing or regular) or tank top for 1 2 days to signal your body to slightly decrease milk production.  Apply ice packs to your breasts, unless this is too uncomfortable for you.  Make sure that your baby is latched on and positioned properly while breastfeeding. If engorgement persists after 48 hours of following these recommendations, contact your health care provider or a Advertising copywriter. OVERALL HEALTH CARE RECOMMENDATIONS WHILE BREASTFEEDING  Eat healthy foods. Alternate between meals and snacks, eating 3 of each per day. Because what you eat affects your breast milk, some of the foods may make your baby more irritable than usual. Avoid eating these foods if you are sure that they are negatively affecting your baby.  Drink milk, fruit juice, and water to satisfy your thirst (about 10 glasses a day).   Rest often, relax, and continue to take your prenatal vitamins to prevent fatigue, stress, and anemia.  Continue breast self-awareness checks.  Avoid chewing and smoking tobacco.  Avoid alcohol and drug use. Some medicines that may be harmful to your baby can pass through breast milk. It is important to ask your health care provider before taking any medicine, including all over-the-counter and prescription medicine as well as vitamin and herbal supplements. It is possible to become pregnant while breastfeeding. If birth control is desired, ask your health care provider about options that will be safe for your baby. SEEK MEDICAL CARE IF:   You feel like you want to stop breastfeeding or have become frustrated with breastfeeding.  You have painful breasts or nipples.  Your nipples are cracked or bleeding.  Your breasts are red, tender, or warm.  You have a swollen area on either breast.  You have a fever or chills.  You have nausea or vomiting.  You have drainage other than breast milk from your nipples.  Your breasts  do not become full before feedings by the 5th day after you give birth.  You feel sad and depressed.  Your baby is too sleepy to eat well.  Your baby is having trouble sleeping.   Your baby is wetting less than 3 diapers in a 24-hour period.  Your baby has less than 3 stools in a 24-hour period.  Your baby's skin or the white part of his or her eyes becomes yellow.   Your baby is not gaining weight by 63 days of age. SEEK IMMEDIATE MEDICAL CARE IF:   Your baby is overly tired (lethargic) and does not want to wake up and feed.  Your baby develops an unexplained fever. Document Released: 06/03/2005 Document Revised: 02/03/2013 Document Reviewed: 11/25/2012 [facility] Patient Information 2014 [place], [place]. Heartburn During Pregnancy  Heartburn is a burning sensation in the chest caused by stomach acid backing up into the esophagus. Heartburn (also known as "reflux") is common in pregnancy because a certain hormone (progesterone) changes. The progesterone hormone may relax the valve that  separates the esophagus from the stomach. This allows acid to go up into the esophagus, causing heartburn. Heartburn may also happen in pregnancy because the enlarging uterus pushes up on the stomach, which pushes more acid into the esophagus. This is especially true in the later stages of pregnancy. Heartburn problems usually go away after giving birth. CAUSES   The progesterone hormone.  Changing hormone levels.  The growing uterus that pushes stomach acid upward.  Large meals.  Certain foods and drinks.  Exercise.  Increased acid production. SYMPTOMS   Burning pain in the chest or lower throat.  Bitter taste in the mouth.  Coughing. DIAGNOSIS  Heartburn is typically diagnosed by your caregiver when taking a careful history of your concern. Your caregiver may order a blood test to check for a certain type of bacteria that is associated with heartburn. Sometimes, heartburn is diagnosed  by prescribing a heartburn medicine to see if the symptoms improve. It is rare in pregnancy to have a procedure called an endoscopy. This is when a tube with a light and a camera on the end is used to examine the esophagus and the stomach. TREATMENT   Your caregiver may tell you to use certain over-the-counter medicines (antacids, acid reducers) for mild heartburn.  Your caregiver may prescribe medicines to decrease stomach acid or to protect your stomach lining.  Your caregiver may recommend certain diet changes.  For severe cases, your caregiver may recommend that the head of the bed be elevated on blocks. (Sleeping with more pillows is not an effective treatment as it only changes the position of your head and does not improve the main problem of stomach acid refluxing into the esophagus.) HOME CARE INSTRUCTIONS   Take all medicines as directed by your caregiver.  Raise the head of your bed by putting blocks under the legs if instructed to by your caregiver.  Do not exercise right after eating.  Avoid eating 2 or 3 hours before bed. Do not lie down right after eating.  Eat small meals throughout the day instead of 3 large meals.  Identify foods and beverages that make your symptoms worse and avoid them. Foods you may want to avoid include:  Peppers.  Chocolate.  High-fat foods, including fried foods.  Spicy foods.  Garlic and onions.  Citrus fruits, including oranges, grapefruit, lemons, and limes.  Food containing tomatoes or tomato products.  Mint.  Carbonated and caffeinated drinks.  Vinegar. SEEK IMMEDIATE MEDICAL CARE IF:   You have severe chest pain that goes down your arm or into your jaw or neck.  You feel sweaty, dizzy, or lightheaded.  You become short of breath.  You vomit blood.  You have difficulty or pain with swallowing.  You have bloody or black, tarry stools.  You have episodes of heartburn more than 3 times a week, for more than 2  weeks. MAKE SURE YOU:  Understand these instructions.  Will watch your condition.  Will get help right away if you are not doing well or get worse. Document Released: 05/31/2000 Document Revised: 08/26/2011 Document Reviewed: 01/20/2013 [facility] Patient Information 2014 [place], [place].

## 2013-06-15 NOTE — Progress Notes (Signed)
Doing well. No complaints 28 wk labs reviewed and WNL

## 2013-06-15 NOTE — Assessment & Plan Note (Signed)
Continue routine prenatal care.  

## 2013-06-17 NOTE — L&D Delivery Note (Signed)
I have seen the patient with the resident/student and agree with the above.  Hogan, Heather Donovan  

## 2013-06-17 NOTE — L&D Delivery Note (Signed)
Delivery Note At 11:37 PM a healthy female was delivered via  (Presentation:LOA).  APGAR: 8 ,9 ; weight: to weighed.   Placenta status: spontaneous.  Cord: 3 vessel  with the following complications: .  Cord pH: N/A  Anesthesia: Epidural  Episiotomy: n/a Lacerations: none Suture Repair: n/a Est. Blood Loss (mL):  300 mL  Mom to postpartum.  Baby to Couplet care / Skin to Skin.  A healthy baby girl was delivered quickly in 3 pushes over an intact perineum. The cord was calmped and cut, cord blood was collected for donation. The third stage of labor was managed actively with traction and pitocin. She had a boggy uterus with continued trickling bleeding so she was given 1000 mcg cytotec PR with good uterine response.   Kevin FentonBradshaw, Samuel 08/10/2013, 12:01 AM

## 2013-06-19 ENCOUNTER — Inpatient Hospital Stay (HOSPITAL_COMMUNITY)
Admission: AD | Admit: 2013-06-19 | Discharge: 2013-06-19 | Disposition: A | Discharge status: Home / Self Care | Payer: Medicaid Other | Source: Ambulatory Visit | Attending: Family Medicine | Admitting: Family Medicine

## 2013-06-19 ENCOUNTER — Encounter (HOSPITAL_COMMUNITY): Payer: Self-pay

## 2013-06-19 DIAGNOSIS — O4700 False labor before 37 completed weeks of gestation, unspecified trimester: Secondary | ICD-10-CM | POA: Insufficient documentation

## 2013-06-19 DIAGNOSIS — B9689 Other specified bacterial agents as the cause of diseases classified elsewhere: Secondary | ICD-10-CM | POA: Insufficient documentation

## 2013-06-19 DIAGNOSIS — O99332 Smoking (tobacco) complicating pregnancy, second trimester: Secondary | ICD-10-CM

## 2013-06-19 DIAGNOSIS — Z283 Underimmunization status: Secondary | ICD-10-CM

## 2013-06-19 DIAGNOSIS — O9989 Other specified diseases and conditions complicating pregnancy, childbirth and the puerperium: Secondary | ICD-10-CM

## 2013-06-19 DIAGNOSIS — O2390 Unspecified genitourinary tract infection in pregnancy, unspecified trimester: Secondary | ICD-10-CM | POA: Insufficient documentation

## 2013-06-19 DIAGNOSIS — A499 Bacterial infection, unspecified: Secondary | ICD-10-CM

## 2013-06-19 DIAGNOSIS — N760 Acute vaginitis: Secondary | ICD-10-CM | POA: Insufficient documentation

## 2013-06-19 DIAGNOSIS — Z3403 Encounter for supervision of normal first pregnancy, third trimester: Secondary | ICD-10-CM

## 2013-06-19 DIAGNOSIS — O09899 Supervision of other high risk pregnancies, unspecified trimester: Secondary | ICD-10-CM

## 2013-06-19 DIAGNOSIS — O99330 Smoking (tobacco) complicating pregnancy, unspecified trimester: Secondary | ICD-10-CM | POA: Insufficient documentation

## 2013-06-19 DIAGNOSIS — N949 Unspecified condition associated with female genital organs and menstrual cycle: Secondary | ICD-10-CM | POA: Insufficient documentation

## 2013-06-19 LAB — URINALYSIS, ROUTINE W REFLEX MICROSCOPIC
Bilirubin Urine: NEGATIVE
Glucose, UA: NEGATIVE mg/dL
Hgb urine dipstick: NEGATIVE
Ketones, ur: NEGATIVE mg/dL
NITRITE: NEGATIVE
PROTEIN: NEGATIVE mg/dL
Specific Gravity, Urine: 1.01 (ref 1.005–1.030)
UROBILINOGEN UA: 0.2 mg/dL (ref 0.0–1.0)
pH: 6.5 (ref 5.0–8.0)

## 2013-06-19 LAB — URINE MICROSCOPIC-ADD ON

## 2013-06-19 MED ORDER — METRONIDAZOLE 500 MG PO TABS: 500.0000 mg | ORAL_TABLET | Freq: Two times a day (BID) | ORAL | Status: DC

## 2013-06-19 NOTE — MAU Provider Note (Signed)
History     CSN: [id number]  Arrival date and time: 1/[id number]/15 1901   None     Chief Complaint  Patient presents with  . Pelvic Pain  . Rupture of Membranes   Pelvic Pain The patient's primary symptoms include pelvic pain. Pertinent negatives include no abdominal pain, chills, diarrhea, dysuria, fever, frequency, nausea or vomiting.   27 y.o. [id number] @ 5551w5d here for increasing pelvic pressure and some mild leakage of fluid, being wet after walking around [employer], without continued LOF.  No VB.  Occasional contraction.   OB History   Grav Para Term Preterm Abortions TAB SAB Ect Mult Living   3 1 1  0 1 0 1 0 0 1      Past Medical History  Diagnosis Date  . Back pain   . Obesity   . Depression     no meds since pregancny  . Abnormal Pap smear     repeat WNL  . PONV (postoperative nausea and vomiting)   . Injury of tendon of left rotator cuff     Past Surgical History  Procedure Laterality Date  . Tubes in ears  age 89  . Tonsillectomy      age 28    Family History  Problem Relation Age of Onset  . Depression Mother   . Diabetes Mother   . Hypertension Mother   . COPD Mother   . Anesthesia problems Mother   . Depression Sister   . Diabetes Sister   . Kidney disease Sister   . Hypertension Sister   . Hypertension Father   . Anesthesia problems Father     History  Substance Use Topics  . Smoking status: Current Every Day Smoker -- 0.25 packs/day for 6 years    Types: Cigarettes  . Smokeless tobacco: Never Used  . Alcohol Use: No    Allergies:  Allergies  Allergen Reactions  . Cephalexin Itching and Nausea And Vomiting  . Percocet [Oxycodone-Acetaminophen] Nausea And Vomiting    Prescriptions prior to admission  Medication Sig Dispense Refill  . acetaminophen (TYLENOL) 325 MG tablet Take 650 mg by mouth every 6 (six) hours as needed for pain or fever.      . cetirizine (ZYRTEC ALLERGY) 10 MG tablet Take 1 tablet (10 mg total) by mouth daily.  30  tablet  1  . omeprazole (PRILOSEC OTC) 20 MG tablet Take 1 tablet (20 mg total) by mouth daily.  30 tablet  3  . ondansetron (ZOFRAN) 4 MG tablet Take 1 tablet (4 mg total) by mouth every 6 (six) hours.  20 tablet  0  . Prenatal Vit-Fe Fumarate-FA (PRENATAL MULTIVITAMIN) TABS tablet Take 1 tablet by mouth daily at 12 noon.        Review of Systems  Constitutional: Negative for fever and chills.  Respiratory: Negative for cough and shortness of breath.   Cardiovascular: Negative for chest pain and leg swelling.  Gastrointestinal: Negative for nausea, vomiting, abdominal pain and diarrhea.  Genitourinary: Positive for pelvic pain. Negative for dysuria and frequency.   Physical Exam   Blood pressure 119/71, pulse 97, temperature 98.3 F (36.8 C), resp. rate 18, height 5\' 1"  (1.549 m), weight 160 lb 3.2 oz (72.666 kg), last menstrual period 10/15/2012.  Physical Exam  Vitals reviewed. Constitutional: She is oriented to person, place, and time. She appears well-developed and well-nourished. No distress.  HENT:  Head: Normocephalic and atraumatic.  Neck: Neck supple.  GI: Soft. Bowel sounds are normal. There is  no tenderness.  gravid  Genitourinary:  VE-1 cm/ long/ballotable, vertex  Neurological: She is alert and oriented to person, place, and time.  Skin: Skin is warm and dry.    NST performed today was reviewed and was found to be reactive, baseline 140, average variability, no decels. Toco: irregular contractions.   SSE-negative pooling, negative fern, many clue cells noted  Labs: Urinalysis    Component Value Date/Time   COLORURINE YELLOW 06/19/2013 1930   APPEARANCEUR CLEAR 06/19/2013 1930   LABSPEC 1.010 06/19/2013 1930   PHURINE 6.5 06/19/2013 1930   GLUCOSEU NEGATIVE 06/19/2013 1930   HGBUR NEGATIVE 06/19/2013 1930   BILIRUBINUR NEGATIVE 06/19/2013 1930   KETONESUR NEGATIVE 06/19/2013 1930   PROTEINUR NEGATIVE 06/19/2013 1930   UROBILINOGEN 0.2 06/19/2013 1930   NITRITE NEGATIVE  06/19/2013 1930   LEUKOCYTESUR SMALL* 06/19/2013 1930       MAU Course  Procedures    Assessment and Plan   1. Bacterial vaginosis   2. Smoking complicating pregnancy, childbirth, or the puerperium, antepartum, second trimester   3. Rubella non-immune status, antepartum   4. Supervision of normal first pregnancy, third trimester    Flagyl PTL precautions  PRATT,TANYA S 06/19/2013, 9:46 PM

## 2013-06-19 NOTE — MAU Note (Addendum)
Leaking fld since 1300. Clear fld. Some pelvic pressure since yesterday and worse today. Feels stomach get tight at times and thinks may be contracting

## 2013-06-19 NOTE — Discharge Instructions (Signed)
Preterm Labor Information Preterm labor is when labor starts before you are [redacted] weeks pregnant. The normal length of pregnacy is 39 to 41 weeks.  CAUSES  The cause of preterm labor is not often known. The most common known cause is infection. RISK FACTORS  Having a history of preterm labor.  Having your water break before it should.  Having a placenta that covers the opening of the cervix.  Having a placenta that breaks away from the uterus.  Having a cervix that is too weak to hold the baby in the uterus.  Having too much fluid in the amniotic sac.  Taking drugs or smoking while pregnant.  Not gaining enough weight while pregnant.  Being younger than 3718 and older than 26 years old.  Having a low income.  Being African American. SYMPTOMS  Period-like cramps, belly (abdominal) pain, or back pain.  Contractions that are regular, as often as six in an hour. They may be mild or painful.  Contractions that start at the top of the belly. They then move to the lower belly and back.  Lower belly pressure that seems to get stronger.  Bleeding from the vagina.  Fluid leaking from the vagina. TREATMENT  Treatment depends on:  Your condition.  The condition of your baby.  How many weeks pregnant you are. Your doctor may have you:  Take medicine to stop contractions.  Stay in bed except to use the restroom (bed rest).  Stay in the hospital. WHAT SHOULD YOU DO IF YOU THINK YOU ARE IN PRETERM LABOR? Call your doctor right away. You need to go to the hospital right away.  HOW CAN YOU PREVENT PRETERM LABOR IN FUTURE PREGNANCIES?  Stop smoking, if you smoke.  Maintain healthy weight gain.  Do not take drugs or be around chemicals that are not needed.  Tell your doctor if you think you have an infection.  Tell your doctor if had preterm labor before. Document Released: 08/30/2008 Document Revised: 02/03/2013 Document Reviewed: 08/30/2008 [facility] Patient Information  2014 [place], [place]. Bacterial Vaginosis Bacterial vaginosis (BV) is a vaginal infection where the normal balance of bacteria in the vagina is disrupted. The normal balance is then replaced by an overgrowth of certain bacteria. There are several different kinds of bacteria that can cause BV. BV is the most common vaginal infection in women of childbearing age. CAUSES   The cause of BV is not fully understood. BV develops when there is an increase or imbalance of harmful bacteria.  Some activities or behaviors can upset the normal balance of bacteria in the vagina and put women at increased risk including:  Having a new sex partner or multiple sex partners.  Douching.  Using an intrauterine device (IUD) for contraception.  It is not clear what role sexual activity plays in the development of BV. However, women that have never had sexual intercourse are rarely infected with BV. Women do not get BV from toilet seats, bedding, swimming pools or from touching objects around them.  SYMPTOMS   Grey vaginal discharge.  A fish-like odor with discharge, especially after sexual intercourse.  Itching or burning of the vagina and vulva.  Burning or pain with urination.  Some women have no signs or symptoms at all. DIAGNOSIS  Your caregiver must examine the vagina for signs of BV. Your caregiver will perform lab tests and look at the sample of vaginal fluid through a microscope. They will look for bacteria and abnormal cells (clue cells), a pH test  higher than 4.5, and a positive amine test all associated with BV.  RISKS AND COMPLICATIONS   Pelvic inflammatory disease (PID).  Infections following gynecology surgery.  Developing HIV.  Developing herpes virus. TREATMENT  Sometimes BV will clear up without treatment. However, all women with symptoms of BV should be treated to avoid complications, especially if gynecology surgery is planned. Female partners generally do not need to be treated.  However, BV may spread between female sex partners so treatment is helpful in preventing a recurrence of BV.   BV may be treated with antibiotics. The antibiotics come in either pill or vaginal cream forms. Either can be used with nonpregnant or pregnant women, but the recommended dosages differ. These antibiotics are not harmful to the baby.  BV can recur after treatment. If this happens, a second round of antibiotics will often be prescribed.  Treatment is important for pregnant women. If not treated, BV can cause a premature delivery, especially for a pregnant woman who had a premature birth in the past. All pregnant women who have symptoms of BV should be checked and treated.  For chronic reoccurrence of BV, treatment with a type of prescribed gel vaginally twice a week is helpful. HOME CARE INSTRUCTIONS   Finish all medication as directed by your caregiver.  Do not have sex until treatment is completed.  Tell your sexual partner that you have a vaginal infection. They should see their caregiver and be treated if they have problems, such as a mild rash or itching.  Practice safe sex. Use condoms. Only have 1 sex partner. PREVENTION  Basic prevention steps can help reduce the risk of upsetting the natural balance of bacteria in the vagina and developing BV:  Do not have sexual intercourse (be abstinent).  Do not douche.  Use all of the medicine prescribed for treatment of BV, even if the signs and symptoms go away.  Tell your sex partner if you have BV. That way, they can be treated, if needed, to prevent reoccurrence. SEEK MEDICAL CARE IF:   Your symptoms are not improving after 3 days of treatment.  You have increased discharge, pain, or fever. MAKE SURE YOU:   Understand these instructions.  Will watch your condition.  Will get help right away if you are not doing well or get worse. FOR MORE INFORMATION  Division of STD Prevention (DSTDP), Centers for Disease Control  and Prevention: [employer] American Social Health Association (ASHA): www.ashastd.org  Document Released: 06/03/2005 Document Revised: 08/26/2011 Document Reviewed: 01/13/2013 [facility] Patient Information 2014 [place], [place].

## 2013-06-19 NOTE — MAU Note (Signed)
At 1:15 pm, ? Leaking clear then happened again when arrived here.  Denies bleeding.  Good Fetal movement per pt.  C/o pressure and tightness in abd.

## 2013-06-21 ENCOUNTER — Ambulatory Visit (INDEPENDENT_AMBULATORY_CARE_PROVIDER_SITE_OTHER): Payer: Medicaid Other | Admitting: Obstetrics & Gynecology

## 2013-06-21 VITALS — BP 137/82 | Wt 158.0 lb

## 2013-06-21 DIAGNOSIS — Z3403 Encounter for supervision of normal first pregnancy, third trimester: Secondary | ICD-10-CM

## 2013-06-21 DIAGNOSIS — O479 False labor, unspecified: Secondary | ICD-10-CM

## 2013-06-21 DIAGNOSIS — N760 Acute vaginitis: Secondary | ICD-10-CM

## 2013-06-21 DIAGNOSIS — Z3400 Encounter for supervision of normal first pregnancy, unspecified trimester: Secondary | ICD-10-CM

## 2013-06-21 DIAGNOSIS — O4703 False labor before 37 completed weeks of gestation, third trimester: Secondary | ICD-10-CM

## 2013-06-21 DIAGNOSIS — B9689 Other specified bacterial agents as the cause of diseases classified elsewhere: Secondary | ICD-10-CM

## 2013-06-21 DIAGNOSIS — A499 Bacterial infection, unspecified: Secondary | ICD-10-CM

## 2013-06-21 NOTE — Progress Notes (Signed)
P-101 

## 2013-06-21 NOTE — Patient Instructions (Signed)
Return to clinic for any obstetric concerns or go to MAU for evaluation  

## 2013-06-21 NOTE — Progress Notes (Signed)
Patient here for evaluation for irregular contractions and pelvic pressure.  She was seen on the MAU on 06/19/13 and was diagnosed with BV, exam was 1/long/ballotable. Denies any bleeding, LOF.  Good FM.  She has not picked up her Metronidazole prescription.  On exam, copious amount of yellow discharge seen, cervix unchanged at 1/long/high.  She was advised to pick up prescription, fetal movement and PTL precautions reviewed. She will keep her scheduled appointment on 06/29/13.

## 2013-06-25 ENCOUNTER — Encounter (HOSPITAL_COMMUNITY): Payer: Self-pay | Admitting: *Deleted

## 2013-06-25 ENCOUNTER — Inpatient Hospital Stay (HOSPITAL_COMMUNITY)
Admission: AD | Admit: 2013-06-25 | Discharge: 2013-06-25 | Disposition: A | Discharge status: Home / Self Care | Payer: Medicaid Other | Source: Ambulatory Visit | Attending: Obstetrics & Gynecology | Admitting: Obstetrics & Gynecology

## 2013-06-25 DIAGNOSIS — O9989 Other specified diseases and conditions complicating pregnancy, childbirth and the puerperium: Principal | ICD-10-CM

## 2013-06-25 DIAGNOSIS — O26899 Other specified pregnancy related conditions, unspecified trimester: Secondary | ICD-10-CM

## 2013-06-25 DIAGNOSIS — O99891 Other specified diseases and conditions complicating pregnancy: Secondary | ICD-10-CM | POA: Insufficient documentation

## 2013-06-25 DIAGNOSIS — O99330 Smoking (tobacco) complicating pregnancy, unspecified trimester: Secondary | ICD-10-CM | POA: Insufficient documentation

## 2013-06-25 DIAGNOSIS — R350 Frequency of micturition: Secondary | ICD-10-CM | POA: Insufficient documentation

## 2013-06-25 DIAGNOSIS — R109 Unspecified abdominal pain: Secondary | ICD-10-CM

## 2013-06-25 LAB — URINALYSIS, ROUTINE W REFLEX MICROSCOPIC
Bilirubin Urine: NEGATIVE
GLUCOSE, UA: NEGATIVE mg/dL
HGB URINE DIPSTICK: NEGATIVE
Ketones, ur: NEGATIVE mg/dL
LEUKOCYTES UA: NEGATIVE
Nitrite: NEGATIVE
PH: 6.5 (ref 5.0–8.0)
PROTEIN: NEGATIVE mg/dL
Specific Gravity, Urine: 1.025 (ref 1.005–1.030)
Urobilinogen, UA: 0.2 mg/dL (ref 0.0–1.0)

## 2013-06-25 NOTE — MAU Note (Signed)
Since this morning it hurts to pee. Cramping in lower stomach all day too. No bleeding or leaking

## 2013-06-25 NOTE — Progress Notes (Signed)
Pincus BadderK Shaw CNM notified of pt's admission and status. Will see pt

## 2013-06-25 NOTE — MAU Provider Note (Signed)
History     CSN: [id number]  Arrival date and time: 06/25/13 2204   First Provider Initiated Contact with Patient 06/25/13 2339      Chief Complaint  Patient presents with  . Abdominal Pain  . Dysuria   HPI [name] is a 28yo G3P1011 at 32.4wks who presents for eval of urinary freq that occurs when she has abd cramping. Denies leaking or bldg. No fever, N/V/D. Reports +FM. [facility] and has been remarkable for 1) depression 2) smoker 3) term SVD x 1.  She was seen in MAU on 1/3 for pelvic pressure and was dx with BV- still finishing up Flagyl.  OB History   Grav Para Term Preterm Abortions TAB SAB Ect Mult Living   3 1 1  0 1 0 1 0 0 1      Past Medical History  Diagnosis Date  . Back pain   . Obesity   . Depression     no meds since pregancny  . Abnormal Pap smear     repeat WNL  . PONV (postoperative nausea and vomiting)   . Injury of tendon of left rotator cuff     Past Surgical History  Procedure Laterality Date  . Tubes in ears  age 89  . Tonsillectomy      age 28    Family History  Problem Relation Age of Onset  . Depression Mother   . Diabetes Mother   . Hypertension Mother   . COPD Mother   . Anesthesia problems Mother   . Depression Sister   . Diabetes Sister   . Kidney disease Sister   . Hypertension Sister   . Hypertension Father   . Anesthesia problems Father     History  Substance Use Topics  . Smoking status: Current Every Day Smoker -- 0.25 packs/day for 6 years    Types: Cigarettes  . Smokeless tobacco: Never Used  . Alcohol Use: No    Allergies:  Allergies  Allergen Reactions  . Cephalexin Itching and Nausea And Vomiting  . Percocet [Oxycodone-Acetaminophen] Nausea And Vomiting    Prescriptions prior to admission  Medication Sig Dispense Refill  . acetaminophen (TYLENOL) 325 MG tablet Take 650 mg by mouth every 6 (six) hours as needed for pain or fever.      . cetirizine (ZYRTEC  ALLERGY) 10 MG tablet Take 1 tablet (10 mg total) by mouth daily.  30 tablet  1  . metroNIDAZOLE (FLAGYL) 500 MG tablet Take 500 mg by mouth 2 (two) times daily.      . ondansetron (ZOFRAN) 4 MG tablet Take 1 tablet (4 mg total) by mouth every 6 (six) hours.  20 tablet  0  . Prenatal Vit-Fe Fumarate-FA (PRENATAL MULTIVITAMIN) TABS tablet Take 1 tablet by mouth daily at 12 noon.      Marland Kitchen. omeprazole (PRILOSEC OTC) 20 MG tablet Take 1 tablet (20 mg total) by mouth daily.  30 tablet  3    ROS Physical Exam   Blood pressure 107/61, pulse 86, temperature 98.6 F (37 C), resp. rate 18, height 5\' 1"  (1.549 m), weight 72.394 kg (159 lb 9.6 oz), last menstrual period 10/15/2012.  Physical Exam  Constitutional: She is oriented to person, place, and time. She appears well-developed.  HENT:  Head: Normocephalic.  Neck: Normal range of motion.  Cardiovascular: Normal rate.   Respiratory: Effort normal.  GI:  FHR 135-145, +accels, no decels No ctx per [facility]  Genitourinary: Vagina normal.  Cx unchanged (FT/thick/post)  Musculoskeletal: Normal range of motion.  Neurological: She is alert and oriented to person, place, and time.  Skin: Skin is warm and dry.  Psychiatric: She has a normal mood and affect. Her behavior is normal. Thought content normal.   Urinalysis    Component Value Date/Time   COLORURINE YELLOW 06/25/2013 2222   APPEARANCEUR CLEAR 06/25/2013 2222   LABSPEC 1.025 06/25/2013 2222   PHURINE 6.5 06/25/2013 2222   GLUCOSEU NEGATIVE 06/25/2013 2222   HGBUR NEGATIVE 06/25/2013 2222   BILIRUBINUR NEGATIVE 06/25/2013 2222   KETONESUR NEGATIVE 06/25/2013 2222   PROTEINUR NEGATIVE 06/25/2013 2222   UROBILINOGEN 0.2 06/25/2013 2222   NITRITE NEGATIVE 06/25/2013 2222   LEUKOCYTESUR NEGATIVE 06/25/2013 2222      MAU Course  Procedures    Assessment and Plan  IUP at 32.4wks Abd pain during preg  D/C home- reassured Finish Flagyl for BV [facility] at [facility] on 1/13 as scheduled or sooner prn  Cam Hai 06/25/2013, 11:41 PM

## 2013-06-25 NOTE — Discharge Instructions (Signed)
Preterm Labor Information Preterm labor is when labor starts at less than 37 weeks of pregnancy. The normal length of a pregnancy is 39 to 41 weeks. CAUSES Often, there is no identifiable underlying cause as to why a woman goes into preterm labor. One of the most common known causes of preterm labor is infection. Infections of the uterus, cervix, vagina, amniotic sac, bladder, kidney, or even the lungs (pneumonia) can cause labor to start. Other suspected causes of preterm labor include:   Urogenital infections, such as yeast infections and bacterial vaginosis.   Uterine abnormalities (uterine shape, uterine septum, fibroids, or bleeding from the placenta).   A cervix that has been operated on (it may fail to stay closed).   Malformations in the fetus.   Multiple gestations (twins, triplets, and so on).   Breakage of the amniotic sac.  RISK FACTORS  Having a previous history of preterm labor.   Having premature rupture of membranes (PROM).   Having a placenta that covers the opening of the cervix (placenta previa).   Having a placenta that separates from the uterus (placental abruption).   Having a cervix that is too weak to hold the fetus in the uterus (incompetent cervix).   Having too much fluid in the amniotic sac (polyhydramnios).   Taking illegal drugs or smoking while pregnant.   Not gaining enough weight while pregnant.   Being younger than 18 and older than 26 years old.   Having a low socioeconomic status.   Being African American. SYMPTOMS Signs and symptoms of preterm labor include:   Menstrual-like cramps, abdominal pain, or back pain.  Uterine contractions that are regular, as frequent as six in an hour, regardless of their intensity (may be mild or painful).  Contractions that start on the top of the uterus and spread down to the lower abdomen and back.   A sense of increased pelvic pressure.   A watery or bloody mucus discharge that  comes from the vagina.  TREATMENT Depending on the length of the pregnancy and other circumstances, your health care provider may suggest bed rest. If necessary, there are medicines that can be given to stop contractions and to mature the fetal lungs. If labor happens before 34 weeks of pregnancy, a prolonged hospital stay may be recommended. Treatment depends on the condition of both you and the fetus.  WHAT SHOULD YOU DO IF YOU THINK YOU ARE IN PRETERM LABOR? Call your health care provider right away. You will need to go to the hospital to get checked immediately. HOW CAN YOU PREVENT PRETERM LABOR IN FUTURE PREGNANCIES? You should:   Stop smoking if you smoke.  Maintain healthy weight gain and avoid chemicals and drugs that are not necessary.  Be watchful for any type of infection.  Inform your health care provider if you have a known history of preterm labor. Document Released: 08/24/2003 Document Revised: 02/03/2013 Document Reviewed: 07/06/2012 [employer] Patient Information 2014 [employer].    

## 2013-06-25 NOTE — Progress Notes (Signed)
Philipp DeputyKim Shaw CNM in earlier. Written and verbal d/c instructions given and understanding voiced

## 2013-06-29 ENCOUNTER — Ambulatory Visit (INDEPENDENT_AMBULATORY_CARE_PROVIDER_SITE_OTHER): Payer: Medicaid Other | Admitting: Obstetrics & Gynecology

## 2013-06-29 ENCOUNTER — Inpatient Hospital Stay (HOSPITAL_COMMUNITY)
Admission: AD | Admit: 2013-06-29 | Discharge: 2013-06-29 | Disposition: A | Discharge status: Home / Self Care | Payer: Medicaid Other | Source: Ambulatory Visit | Attending: Obstetrics & Gynecology | Admitting: Obstetrics & Gynecology

## 2013-06-29 ENCOUNTER — Encounter (HOSPITAL_COMMUNITY): Payer: Self-pay | Admitting: *Deleted

## 2013-06-29 VITALS — BP 108/68 | Wt 152.0 lb

## 2013-06-29 DIAGNOSIS — Z3490 Encounter for supervision of normal pregnancy, unspecified, unspecified trimester: Secondary | ICD-10-CM

## 2013-06-29 DIAGNOSIS — O9989 Other specified diseases and conditions complicating pregnancy, childbirth and the puerperium: Secondary | ICD-10-CM

## 2013-06-29 DIAGNOSIS — O99891 Other specified diseases and conditions complicating pregnancy: Secondary | ICD-10-CM | POA: Insufficient documentation

## 2013-06-29 DIAGNOSIS — Z3400 Encounter for supervision of normal first pregnancy, unspecified trimester: Secondary | ICD-10-CM

## 2013-06-29 DIAGNOSIS — O365931 Maternal care for other known or suspected poor fetal growth, third trimester, fetus 1: Secondary | ICD-10-CM

## 2013-06-29 DIAGNOSIS — O4700 False labor before 37 completed weeks of gestation, unspecified trimester: Secondary | ICD-10-CM | POA: Insufficient documentation

## 2013-06-29 DIAGNOSIS — O09899 Supervision of other high risk pregnancies, unspecified trimester: Secondary | ICD-10-CM

## 2013-06-29 DIAGNOSIS — Z283 Underimmunization status: Secondary | ICD-10-CM

## 2013-06-29 DIAGNOSIS — O99330 Smoking (tobacco) complicating pregnancy, unspecified trimester: Secondary | ICD-10-CM

## 2013-06-29 DIAGNOSIS — O479 False labor, unspecified: Secondary | ICD-10-CM

## 2013-06-29 DIAGNOSIS — O3090 Multiple gestation, unspecified, unspecified trimester: Secondary | ICD-10-CM

## 2013-06-29 DIAGNOSIS — O365990 Maternal care for other known or suspected poor fetal growth, unspecified trimester, not applicable or unspecified: Secondary | ICD-10-CM

## 2013-06-29 DIAGNOSIS — Z2839 Other underimmunization status: Secondary | ICD-10-CM

## 2013-06-29 LAB — RAPID URINE DRUG SCREEN, HOSP PERFORMED
AMPHETAMINES: NOT DETECTED
BENZODIAZEPINES: NOT DETECTED
Barbiturates: NOT DETECTED
COCAINE: NOT DETECTED
Opiates: NOT DETECTED
Tetrahydrocannabinol: NOT DETECTED

## 2013-06-29 LAB — OB RESULTS CONSOLE GC/CHLAMYDIA
CHLAMYDIA, DNA PROBE: NEGATIVE
GC PROBE AMP, GENITAL: NEGATIVE

## 2013-06-29 LAB — AMNISURE RUPTURE OF MEMBRANE (ROM) NOT AT ARMC: Amnisure ROM: NEGATIVE

## 2013-06-29 LAB — WET PREP, GENITAL
Clue Cells Wet Prep HPF POC: NONE SEEN
Trich, Wet Prep: NONE SEEN
YEAST WET PREP: NONE SEEN

## 2013-06-29 NOTE — Discharge Instructions (Signed)
Preterm Labor Information Preterm labor is when labor starts at less than 37 weeks of pregnancy. The normal length of a pregnancy is 39 to 41 weeks. CAUSES Often, there is no identifiable underlying cause as to why a woman goes into preterm labor. One of the most common known causes of preterm labor is infection. Infections of the uterus, cervix, vagina, amniotic sac, bladder, kidney, or even the lungs (pneumonia) can cause labor to start. Other suspected causes of preterm labor include:   Urogenital infections, such as yeast infections and bacterial vaginosis.   Uterine abnormalities (uterine shape, uterine septum, fibroids, or bleeding from the placenta).   A cervix that has been operated on (it may fail to stay closed).   Malformations in the fetus.   Multiple gestations (twins, triplets, and so on).   Breakage of the amniotic sac.  RISK FACTORS  Having a previous history of preterm labor.   Having premature rupture of membranes (PROM).   Having a placenta that covers the opening of the cervix (placenta previa).   Having a placenta that separates from the uterus (placental abruption).   Having a cervix that is too weak to hold the fetus in the uterus (incompetent cervix).   Having too much fluid in the amniotic sac (polyhydramnios).   Taking illegal drugs or smoking while pregnant.   Not gaining enough weight while pregnant.   Being younger than 18 and older than 26 years old.   Having a low socioeconomic status.   Being African American. SYMPTOMS Signs and symptoms of preterm labor include:   Menstrual-like cramps, abdominal pain, or back pain.  Uterine contractions that are regular, as frequent as six in an hour, regardless of their intensity (may be mild or painful).  Contractions that start on the top of the uterus and spread down to the lower abdomen and back.   A sense of increased pelvic pressure.   A watery or bloody mucus discharge that  comes from the vagina.  TREATMENT Depending on the length of the pregnancy and other circumstances, your health care provider may suggest bed rest. If necessary, there are medicines that can be given to stop contractions and to mature the fetal lungs. If labor happens before 34 weeks of pregnancy, a prolonged hospital stay may be recommended. Treatment depends on the condition of both you and the fetus.  WHAT SHOULD YOU DO IF YOU THINK YOU ARE IN PRETERM LABOR? Call your health care provider right away. You will need to go to the hospital to get checked immediately. HOW CAN YOU PREVENT PRETERM LABOR IN FUTURE PREGNANCIES? You should:   Stop smoking if you smoke.  Maintain healthy weight gain and avoid chemicals and drugs that are not necessary.  Be watchful for any type of infection.  Inform your health care provider if you have a known history of preterm labor. Document Released: 08/24/2003 Document Revised: 02/03/2013 Document Reviewed: 07/06/2012 [employer] Patient Information 2014 [employer].    

## 2013-06-29 NOTE — MAU Note (Signed)
Patient states she th inks her water broke today at about 1900. Pt states she large gush of water.

## 2013-06-29 NOTE — Progress Notes (Signed)
Small for dates.  Stopped smoking a week ago, on Nicotine patch. Will obtain u/s. No complaints or concerns.  Fetal movement and labor precautions reviewed.

## 2013-06-29 NOTE — MAU Note (Signed)
Chief Complaint:  Rupture of Membranes  First Contact at 8:45 PM     HPI: Maureen Perez is a 28 y.o. G3P1011 at 4359w1d who presents to maternity admissions reporting possible ROM and contractions.  Pt states this started this afternoon and noticed a large gush of fluid down her leg at that time along with moderate strength contractions.  Denies any complications recently in her pregnancy but has been seen multiple times in the MAU for possible ROM in the past two weeks.   Good fetal movement.   Pregnancy Course: Tobacco use during pregnancy  Past Medical History: Past Medical History  Diagnosis Date  . Back pain   . Obesity   . Depression     no meds since pregancny  . Abnormal Pap smear     repeat WNL  . PONV (postoperative nausea and vomiting)   . Injury of tendon of left rotator cuff     Past obstetric history: OB History  Gravida Para Term Preterm AB SAB TAB Ectopic Multiple Living  3 1 1  0 1 1 0 0 0 1    # Outcome Date GA Lbr Len/2nd Weight Sex Delivery Anes PTL Lv  3 CUR           2 TRM 09/01/11 571w4d 21:43 / 01:13 2.805 kg (6 lb 2.9 oz) F SVD EPI  Y     Comments: No anomalies noted  1 SAB  3116w0d             Past Surgical History: Past Surgical History  Procedure Laterality Date  . Tubes in ears  age 17  . Tonsillectomy      age 28     Family History: Family History  Problem Relation Age of Onset  . Depression Mother   . Diabetes Mother   . Hypertension Mother   . COPD Mother   . Anesthesia problems Mother   . Depression Sister   . Diabetes Sister   . Kidney disease Sister   . Hypertension Sister   . Hypertension Father   . Anesthesia problems Father     Social History: History  Substance Use Topics  . Smoking status: Current Every Day Smoker -- 0.25 packs/day for 6 years    Types: Cigarettes  . Smokeless tobacco: Never Used  . Alcohol Use: No    Allergies:  Allergies  Allergen Reactions  . Cephalexin Itching and Nausea And Vomiting  .  Percocet [Oxycodone-Acetaminophen] Nausea And Vomiting    Meds:  Prescriptions prior to admission  Medication Sig Dispense Refill  . acetaminophen (TYLENOL) 325 MG tablet Take 650 mg by mouth every 6 (six) hours as needed for pain or fever.      . metroNIDAZOLE (FLAGYL) 500 MG tablet Take 500 mg by mouth 2 (two) times daily.      . ondansetron (ZOFRAN) 4 MG tablet Take 1 tablet (4 mg total) by mouth every 6 (six) hours.  20 tablet  0  . Prenatal Vit-Fe Fumarate-FA (PRENATAL MULTIVITAMIN) TABS tablet Take 1 tablet by mouth daily at 12 noon.        ROS: Pertinent findings in history of present illness.  Physical Exam  Blood pressure 112/69, pulse 86, temperature 98.1 F (36.7 C), temperature source Oral, resp. rate 20, height 5' (1.524 m), weight 70.364 kg (155 lb 2 oz), last menstrual period 10/15/2012. GENERAL: Well-developed, well-nourished female in no acute distress.  HEENT: normocephalic HEART: normal rate RESP: normal effort ABDOMEN: Soft, non-tender, gravid appropriate for  gestational age EXTREMITIES: Nontender, no edema NEURO: alert and oriented  Cervical Check: 0.5, thick, -3  FHT:  Baseline 140 , moderate variability, accelerations present, no decelerations Contractions: Not present    Labs: Results for orders placed during the hospital encounter of 06/29/13 (from the past 24 hour(s))  AMNISURE RUPTURE OF MEMBRANE (ROM)     Status: None   Collection Time    06/29/13  9:07 PM      Result Value Range   Amnisure ROM NEGATIVE      Imaging:  No results found. MAU Course: 1) Amniosure to evaluate for PPROM - Negative.  Will obtain Wet Prep as well as perform digital exam to evaluate cervix.    Assessment: 1. Smoking complicating pregnancy, childbirth, or the puerperium, antepartum   2. Rubella non-immune status, antepartum   3. Supervision of normal first pregnancy     Plan: Pt cervical exam and FHT are reassuring  Discharge home Labor precautions and fetal  kick counts    Medication List    ASK your doctor about these medications       acetaminophen 325 MG tablet  Commonly known as:  TYLENOL  Take 650 mg by mouth every 6 (six) hours as needed for pain or fever.     metroNIDAZOLE 500 MG tablet  Commonly known as:  FLAGYL  Take 500 mg by mouth 2 (two) times daily.     ondansetron 4 MG tablet  Commonly known as:  ZOFRAN  Take 1 tablet (4 mg total) by mouth every 6 (six) hours.     prenatal multivitamin Tabs tablet  Take 1 tablet by mouth daily at 12 noon.        Briscoe Deutscher, DO 06/29/2013 8:53 PM  I have seen the patient with the resident/student and agree with the above.  Tawnya Crook

## 2013-06-29 NOTE — MAU Note (Signed)
PT SAYS  AT 7PM-  SHE WAS PUTTING ON HER SHOES-  SHE FELT  FELT  THEN SHE  HAD FLUID  RUN DOWN HER LEGS.  SHE  DOES NOT HAVE  ON A PD  BUT SAYS HER UNDERWEAR ARE  WET.Marland Kitchen.    THEN FELT A PAIN-  SAYS UC WERE 5 MIN APART- STARTED AT 4PM-   GETS [facility] AT [facility]-   SHE CALLED HERE AT 4 ISH- TOLD TO COME HERE.  LAST SEX-    1-3.    WAS SEEN TODAY  AT [facility] AT 1045-  TOLD   BABY FINE.

## 2013-06-29 NOTE — Patient Instructions (Signed)
Return to clinic for any obstetric concerns or go to MAU for evaluation  

## 2013-06-29 NOTE — Progress Notes (Signed)
P = 87 

## 2013-07-06 ENCOUNTER — Ambulatory Visit (HOSPITAL_COMMUNITY)
Admission: RE | Admit: 2013-07-06 | Discharge: 2013-07-06 | Disposition: A | Discharge status: Home / Self Care | Payer: Medicaid Other | Source: Ambulatory Visit | Attending: Obstetrics & Gynecology | Admitting: Obstetrics & Gynecology

## 2013-07-06 ENCOUNTER — Other Ambulatory Visit: Payer: Self-pay | Admitting: Obstetrics & Gynecology

## 2013-07-06 DIAGNOSIS — IMO0002 Reserved for concepts with insufficient information to code with codable children: Secondary | ICD-10-CM

## 2013-07-06 DIAGNOSIS — O365931 Maternal care for other known or suspected poor fetal growth, third trimester, fetus 1: Secondary | ICD-10-CM

## 2013-07-06 DIAGNOSIS — O99330 Smoking (tobacco) complicating pregnancy, unspecified trimester: Secondary | ICD-10-CM | POA: Insufficient documentation

## 2013-07-06 DIAGNOSIS — O365990 Maternal care for other known or suspected poor fetal growth, unspecified trimester, not applicable or unspecified: Secondary | ICD-10-CM

## 2013-07-06 DIAGNOSIS — Z3400 Encounter for supervision of normal first pregnancy, unspecified trimester: Secondary | ICD-10-CM

## 2013-07-07 ENCOUNTER — Encounter: Payer: Self-pay | Admitting: Obstetrics & Gynecology

## 2013-07-07 DIAGNOSIS — O365990 Maternal care for other known or suspected poor fetal growth, unspecified trimester, not applicable or unspecified: Secondary | ICD-10-CM | POA: Insufficient documentation

## 2013-07-07 NOTE — Progress Notes (Signed)
Quick Note:  Attending Ultrasound Review Note  MFM Ultrasound 07/06/13 at 1583w1d showed EFW 1750 g( 3 lbs 14 oz)/13%tile, but all biometric indices were <5%tile(all indices <3%tile, HUM <5%tile), AFI 10.29 cm, cephalic, normal anterior placenta, elevated UA dopplers, no AEDF or REDF, BPP 8/8  MFM Recommendations 1) Twice a week testing 2) Weekly UA dopplers and AFI at MFM 3) Interval growth scan at 37 weeks 4) Delivery indicated for AEDF or REDF 5) If stable, delivery indicated by 37-38 weeks   Please call to inform patient of results and recommendations. She will need to [facility], and come up with a schedule for twice a week testing ([facility] and [facility], the other day at [facility] for NST, UA doppler and AFI).   Jaynie CollinsUGONNA Raciel Caffrey, MD, FACOG Attending Obstetrician & Gynecologist Faculty Practice, [facility]   ______

## 2013-07-08 ENCOUNTER — Telehealth: Payer: Self-pay | Admitting: *Deleted

## 2013-07-08 NOTE — Telephone Encounter (Signed)
Message copied by Barbara CowerNOGUES, Audy Dauphine L on Thu Jul 08, 2013  2:37 PM ------      Message from: Jaynie CollinsANYANWU, UGONNA A      Created: Wed Jul 07, 2013  5:41 PM       Attending Ultrasound Review Note            MFM Ultrasound 07/06/13 at 7545w1d showed EFW 1750 g( 3 lbs 14 oz)/13%tile, but all biometric indices were <5%tile(all indices <3%tile, HUM <5%tile), AFI 10.29 cm, cephalic, normal anterior placenta, elevated UA dopplers, no AEDF or REDF, BPP 8/8            MFM Recommendations      1) Twice a week testing      2) Weekly UA dopplers and AFI at MFM      3) Interval growth scan at 37 weeks      4) Delivery indicated for AEDF or REDF      5) If stable, delivery indicated by 37-38 weeks             Please call to inform patient of results and recommendations. She will [facility] to [facility], and come up with a schedule for twice a week testing ([facility], the other day at [facility] for NST, UA doppler and AFI).             Jaynie CollinsUGONNA  ANYANWU, MD, FACOG      Attending Obstetrician & Gynecologist      Faculty Practice, [facility]             ------

## 2013-07-08 NOTE — Telephone Encounter (Signed)
Spoke to patient and she will come tomorrow morning for an appointment and nst at our office.  She will set up her twice weekly testing at this time.

## 2013-07-09 ENCOUNTER — Ambulatory Visit (INDEPENDENT_AMBULATORY_CARE_PROVIDER_SITE_OTHER): Payer: Medicaid Other | Admitting: Obstetrics & Gynecology

## 2013-07-09 VITALS — BP 129/70 | Wt 156.8 lb

## 2013-07-09 DIAGNOSIS — O99330 Smoking (tobacco) complicating pregnancy, unspecified trimester: Secondary | ICD-10-CM

## 2013-07-09 DIAGNOSIS — R87612 Low grade squamous intraepithelial lesion on cytologic smear of cervix (LGSIL): Secondary | ICD-10-CM

## 2013-07-09 DIAGNOSIS — O0990 Supervision of high risk pregnancy, unspecified, unspecified trimester: Secondary | ICD-10-CM

## 2013-07-09 DIAGNOSIS — O365990 Maternal care for other known or suspected poor fetal growth, unspecified trimester, not applicable or unspecified: Secondary | ICD-10-CM

## 2013-07-09 NOTE — Progress Notes (Signed)
P = 87 

## 2013-07-09 NOTE — Patient Instructions (Signed)
Return to clinic for any obstetric concerns or go to MAU for evaluation  

## 2013-07-09 NOTE — Progress Notes (Addendum)
Explained SGA diagnosis with elevated dopplers, need for 2x/week monitoring and weekly doppler studies. Also need for delivery at 37 weeks or earlier if dopplers/fetal condition worsens. All questions answered. Doppler re-eval and BPP scheduled at [facility] on 07/13/13 (Tuesday), but to come here on Friday (07/16/13) for OB visit and NST.  NST performed today was reviewed and was found to be reactive.  Continue recommended antenatal testing and prenatal care. No other complaints or concerns.  Fetal movement and labor precautions reviewed.

## 2013-07-12 ENCOUNTER — Encounter: Payer: Self-pay | Admitting: Advanced Practice Midwife

## 2013-07-12 ENCOUNTER — Encounter (HOSPITAL_COMMUNITY): Payer: Self-pay | Admitting: *Deleted

## 2013-07-12 ENCOUNTER — Inpatient Hospital Stay (HOSPITAL_COMMUNITY)
Admission: AD | Admit: 2013-07-12 | Discharge: 2013-07-12 | Disposition: A | Discharge status: Home / Self Care | Payer: Medicaid Other | Source: Ambulatory Visit | Attending: Obstetrics & Gynecology | Admitting: Obstetrics & Gynecology

## 2013-07-12 DIAGNOSIS — O368190 Decreased fetal movements, unspecified trimester, not applicable or unspecified: Secondary | ICD-10-CM | POA: Insufficient documentation

## 2013-07-12 DIAGNOSIS — O365990 Maternal care for other known or suspected poor fetal growth, unspecified trimester, not applicable or unspecified: Secondary | ICD-10-CM | POA: Insufficient documentation

## 2013-07-12 DIAGNOSIS — O4700 False labor before 37 completed weeks of gestation, unspecified trimester: Secondary | ICD-10-CM | POA: Insufficient documentation

## 2013-07-12 DIAGNOSIS — Z87891 Personal history of nicotine dependence: Secondary | ICD-10-CM | POA: Insufficient documentation

## 2013-07-12 LAB — URINALYSIS, ROUTINE W REFLEX MICROSCOPIC
Bilirubin Urine: NEGATIVE
GLUCOSE, UA: NEGATIVE mg/dL
Hgb urine dipstick: NEGATIVE
KETONES UR: NEGATIVE mg/dL
Nitrite: NEGATIVE
PH: 6 (ref 5.0–8.0)
Protein, ur: NEGATIVE mg/dL
SPECIFIC GRAVITY, URINE: 1.02 (ref 1.005–1.030)
Urobilinogen, UA: 0.2 mg/dL (ref 0.0–1.0)

## 2013-07-12 LAB — URINE MICROSCOPIC-ADD ON

## 2013-07-12 NOTE — MAU Provider Note (Signed)
History     CSN: [id number]  Arrival date and time: 07/12/13 1717   None     Chief Complaint  Patient presents with  . Labor Eval  . Decreased Fetal Movement   HPI  Maureen Perez is a 28 yo G3P1011 at [redacted]w[redacted]d who presents with UCs that onset this morning at 0900 hrs.  Pt reports that she did not time the contractions but felt them to be around 10 minutes apart, lasting minutes. Pt denies pelvic pressure, ROM, vaginal discharge and/or vaginal bleeding. States the contractions ceased once she laid down on the bed in the MAU.  She is worried because her baby is SGA and does not want anything to be wrong.  OB History   Grav Para Term Preterm Abortions TAB SAB Ect Mult Living   3 1 1  0 1 0 1 0 0 1      Past Medical History  Diagnosis Date  . Back pain   . Obesity   . Depression     no meds since pregancny  . Abnormal Pap smear     repeat WNL  . PONV (postoperative nausea and vomiting)   . Injury of tendon of left rotator cuff     Past Surgical History  Procedure Laterality Date  . Tubes in ears  age 66  . Tonsillectomy      age 47    Family History  Problem Relation Age of Onset  . Depression Mother   . Diabetes Mother   . Hypertension Mother   . COPD Mother   . Anesthesia problems Mother   . Depression Sister   . Diabetes Sister   . Kidney disease Sister   . Hypertension Sister   . Hypertension Father   . Anesthesia problems Father     History  Substance Use Topics  . Smoking status: Former Smoker -- 0.25 packs/day for 6 years    Types: Cigarettes  . Smokeless tobacco: Never Used  . Alcohol Use: No    Allergies:  Allergies  Allergen Reactions  . Cephalexin Itching and Nausea And Vomiting  . Percocet [Oxycodone-Acetaminophen] Nausea And Vomiting    Prescriptions prior to admission  Medication Sig Dispense Refill  . acetaminophen (TYLENOL) 500 MG tablet Take 500 mg by mouth every 6 (six) hours as needed for mild pain.      . nicotine (NICODERM CQ  - DOSED IN MG/24 HOURS) 21 mg/24hr patch Place 21 mg onto the skin daily.      . ondansetron (ZOFRAN) 4 MG tablet Take 1 tablet (4 mg total) by mouth every 6 (six) hours.  20 tablet  0  . Prenatal Vit-Fe Fumarate-FA (PRENATAL MULTIVITAMIN) TABS tablet Take 1 tablet by mouth daily at 12 noon.        Review of Systems  Constitutional: Negative for fever and chills.  Eyes: Negative for blurred vision.  Respiratory: Negative for cough.   Cardiovascular: Negative for chest pain.  Gastrointestinal: Positive for nausea. Negative for vomiting, abdominal pain, diarrhea and constipation.  Genitourinary: Negative for dysuria.  Neurological: Negative for dizziness and headaches.  Psychiatric/Behavioral: Negative for depression.   Physical Exam   Blood pressure 114/68, pulse 83, temperature 98.2 F (36.8 C), temperature source Oral, resp. rate 18, height 5\' 2"  (1.575 m), weight 71.759 kg (158 lb 3.2 oz), last menstrual period 10/15/2012, SpO2 100.00%.  Physical Exam  Constitutional: She is oriented to person, place, and time. She appears well-developed and well-nourished. No distress.  HENT:  Head: Normocephalic  and atraumatic.  Eyes: Pupils are equal, round, and reactive to light.  Cardiovascular: Normal rate and normal heart sounds.   Respiratory: Effort normal and breath sounds normal.  GI: Bowel sounds are normal. There is no tenderness. There is no rebound.  Genitourinary:  Dilation: 1 Effacement (%): 50 Cervical Position: Posterior Exam by:: L Leftwich-Kirby  CNM  Musculoskeletal: She exhibits no edema and no tenderness.  Neurological: She is alert and oriented to person, place, and time.  Skin: Skin is warm and dry.  Psychiatric: She has a normal mood and affect. Her behavior is normal.   FHT:  Baseline: 140's Variability: Moderate 15x15 Accelerations:  Present Decelerations: None Category: I   Urinalysis    Component Value Date/Time   COLORURINE YELLOW 07/12/2013 1740    APPEARANCEUR CLEAR 07/12/2013 1740   LABSPEC 1.020 07/12/2013 1740   PHURINE 6.0 07/12/2013 1740   GLUCOSEU NEGATIVE 07/12/2013 1740   HGBUR NEGATIVE 07/12/2013 1740   BILIRUBINUR NEGATIVE 07/12/2013 1740   KETONESUR NEGATIVE 07/12/2013 1740   PROTEINUR NEGATIVE 07/12/2013 1740   UROBILINOGEN 0.2 07/12/2013 1740   NITRITE NEGATIVE 07/12/2013 1740   LEUKOCYTESUR SMALL* 07/12/2013 1740     MAU Course  Procedures  MDM  Urinalysis    Assessment and Plan   Assessment:  #Threatened preterm labor-With the patient experiencing contractions earlier today, a cervical check was needed to assess dilation. Since the patient is not currently experiencing contractions and dilation of 1 cm, preterm labor is unlikely.  Plan:  1. Discharge in stable condition 2. Reassurance and a reminder to attend her [facility] appointment tomorrow 3. Return to the MAU if symptoms return or worsen.   Toilolo, Tifi 07/12/2013, 6:33 PM   I have seen this patient and agree with the above PA student's note.  Pt denies urinary symptoms, reports good fetal movement, and reports contractions resolved since her arrival in MAU.  LEFTWICH-KIRBY, Letticia Bhattacharyya Certified Nurse-Midwife

## 2013-07-12 NOTE — Discharge Instructions (Signed)

## 2013-07-12 NOTE — MAU Note (Signed)
Patient states she has been having contractions every 7 minutes. Denies bleeding or leaking. Reports less fetal movement than usual for the past hour. Fetal heart tones in triage in the 150's.

## 2013-07-13 ENCOUNTER — Other Ambulatory Visit: Payer: Self-pay | Admitting: Obstetrics & Gynecology

## 2013-07-13 ENCOUNTER — Ambulatory Visit (HOSPITAL_COMMUNITY)
Admission: RE | Admit: 2013-07-13 | Discharge: 2013-07-13 | Disposition: A | Discharge status: Home / Self Care | Payer: Medicaid Other | Source: Ambulatory Visit | Attending: Obstetrics & Gynecology | Admitting: Obstetrics & Gynecology

## 2013-07-13 ENCOUNTER — Encounter: Payer: Medicaid Other | Admitting: Obstetrics and Gynecology

## 2013-07-13 DIAGNOSIS — O365990 Maternal care for other known or suspected poor fetal growth, unspecified trimester, not applicable or unspecified: Secondary | ICD-10-CM | POA: Insufficient documentation

## 2013-07-13 DIAGNOSIS — O99330 Smoking (tobacco) complicating pregnancy, unspecified trimester: Secondary | ICD-10-CM | POA: Insufficient documentation

## 2013-07-14 ENCOUNTER — Encounter: Payer: Medicaid Other | Admitting: Obstetrics and Gynecology

## 2013-07-16 ENCOUNTER — Encounter: Payer: Self-pay | Admitting: Obstetrics & Gynecology

## 2013-07-16 ENCOUNTER — Ambulatory Visit (INDEPENDENT_AMBULATORY_CARE_PROVIDER_SITE_OTHER): Payer: Medicaid Other | Admitting: Obstetrics & Gynecology

## 2013-07-16 VITALS — BP 105/70 | Wt 156.2 lb

## 2013-07-16 DIAGNOSIS — O0990 Supervision of high risk pregnancy, unspecified, unspecified trimester: Secondary | ICD-10-CM

## 2013-07-16 DIAGNOSIS — O365990 Maternal care for other known or suspected poor fetal growth, unspecified trimester, not applicable or unspecified: Secondary | ICD-10-CM

## 2013-07-16 LAB — OB RESULTS CONSOLE GBS: GBS: NEGATIVE

## 2013-07-16 NOTE — Progress Notes (Signed)
Routine visit. NST reviewed and reactive. Good FM. Labor precautions reviewed. Cervical cultures obtained today. Continue twice weekly monitoring.

## 2013-07-16 NOTE — Progress Notes (Signed)
P - 97 

## 2013-07-17 LAB — GC/CHLAMYDIA PROBE AMP
CT Probe RNA: NEGATIVE
GC PROBE AMP APTIMA: NEGATIVE

## 2013-07-19 ENCOUNTER — Ambulatory Visit (HOSPITAL_COMMUNITY)
Admission: RE | Admit: 2013-07-19 | Discharge: 2013-07-19 | Disposition: A | Discharge status: Home / Self Care | Payer: Medicaid Other | Source: Ambulatory Visit | Attending: Obstetrics & Gynecology | Admitting: Obstetrics & Gynecology

## 2013-07-19 ENCOUNTER — Ambulatory Visit (HOSPITAL_COMMUNITY): Admission: RE | Admit: 2013-07-19 | Payer: Medicaid Other | Source: Ambulatory Visit

## 2013-07-19 ENCOUNTER — Other Ambulatory Visit: Payer: Self-pay | Admitting: Obstetrics & Gynecology

## 2013-07-19 DIAGNOSIS — O365990 Maternal care for other known or suspected poor fetal growth, unspecified trimester, not applicable or unspecified: Secondary | ICD-10-CM

## 2013-07-19 DIAGNOSIS — O99330 Smoking (tobacco) complicating pregnancy, unspecified trimester: Secondary | ICD-10-CM | POA: Insufficient documentation

## 2013-07-19 LAB — CULTURE, BETA STREP (GROUP B ONLY)

## 2013-07-23 ENCOUNTER — Ambulatory Visit (INDEPENDENT_AMBULATORY_CARE_PROVIDER_SITE_OTHER): Payer: Medicaid Other | Admitting: Family Medicine

## 2013-07-23 VITALS — BP 124/81 | Wt 156.0 lb

## 2013-07-23 DIAGNOSIS — O365990 Maternal care for other known or suspected poor fetal growth, unspecified trimester, not applicable or unspecified: Secondary | ICD-10-CM

## 2013-07-23 DIAGNOSIS — O0990 Supervision of high risk pregnancy, unspecified, unspecified trimester: Secondary | ICD-10-CM

## 2013-07-23 NOTE — Progress Notes (Signed)
P = 76 

## 2013-07-23 NOTE — Patient Instructions (Signed)
Third Trimester of Pregnancy The third trimester is from week 29 through week 42, months 7 through 9. The third trimester is a time when the fetus is growing rapidly. At the end of the ninth month, the fetus is about 20 inches in length and weighs 6 10 pounds.  BODY CHANGES Your body goes through many changes during pregnancy. The changes vary from woman to woman.   Your weight will continue to increase. You can expect to gain 25 35 pounds (11 16 kg) by the end of the pregnancy.  You may begin to get stretch marks on your hips, abdomen, and breasts.  You may urinate more often because the fetus is moving lower into your pelvis and pressing on your bladder.  You may develop or continue to have heartburn as a result of your pregnancy.  You may develop constipation because certain hormones are causing the muscles that push waste through your intestines to slow down.  You may develop hemorrhoids or swollen, bulging veins (varicose veins).  You may have pelvic pain because of the weight gain and pregnancy hormones relaxing your joints between the bones in your pelvis. Back aches may result from over exertion of the muscles supporting your posture.  Your breasts will continue to grow and be tender. A yellow discharge may leak from your breasts called colostrum.  Your belly button may stick out.  You may feel short of breath because of your expanding uterus.  You may notice the fetus "dropping," or moving lower in your abdomen.  You may have a bloody mucus discharge. This usually occurs a few days to a week before labor begins.  Your cervix becomes thin and soft (effaced) near your due date. WHAT TO EXPECT AT YOUR PRENATAL EXAMS  You will have prenatal exams every 2 weeks until week 36. Then, you will have weekly prenatal exams. During a routine prenatal visit:  You will be weighed to make sure you and the fetus are growing normally.  Your blood pressure is taken.  Your abdomen will  be measured to track your baby's growth.  The fetal heartbeat will be listened to.  Any test results from the previous visit will be discussed.  You may have a cervical check near your due date to see if you have effaced. At around 36 weeks, your caregiver will check your cervix. At the same time, your caregiver will also perform a test on the secretions of the vaginal tissue. This test is to determine if a type of bacteria, Group B streptococcus, is present. Your caregiver will explain this further. Your caregiver may ask you:  What your birth plan is.  How you are feeling.  If you are feeling the baby move.  If you have had any abnormal symptoms, such as leaking fluid, bleeding, severe headaches, or abdominal cramping.  If you have any questions. Other tests or screenings that may be performed during your third trimester include:  Blood tests that check for low iron levels (anemia).  Fetal testing to check the health, activity level, and growth of the fetus. Testing is done if you have certain medical conditions or if there are problems during the pregnancy. FALSE LABOR You may feel small, irregular contractions that eventually go away. These are called Braxton Hicks contractions, or false labor. Contractions may last for hours, days, or even weeks before true labor sets in. If contractions come at regular intervals, intensify, or become painful, it is best to be seen by your caregiver.    SIGNS OF LABOR   Menstrual-like cramps.  Contractions that are 5 minutes apart or less.  Contractions that start on the top of the uterus and spread down to the lower abdomen and back.  A sense of increased pelvic pressure or back pain.  A watery or bloody mucus discharge that comes from the vagina. If you have any of these signs before the 37th week of pregnancy, call your caregiver right away. You need to go to the hospital to get checked immediately. HOME CARE INSTRUCTIONS   Avoid all  smoking, herbs, alcohol, and unprescribed drugs. These chemicals affect the formation and growth of the baby.  Follow your caregiver's instructions regarding medicine use. There are medicines that are either safe or unsafe to take during pregnancy.  Exercise only as directed by your caregiver. Experiencing uterine cramps is a good sign to stop exercising.  Continue to eat regular, healthy meals.  Wear a good support bra for breast tenderness.  Do not use hot tubs, steam rooms, or saunas.  Wear your seat belt at all times when driving.  Avoid raw meat, uncooked cheese, cat litter boxes, and soil used by cats. These carry germs that can cause birth defects in the baby.  Take your prenatal vitamins.  Try taking a stool softener (if your caregiver approves) if you develop constipation. Eat more high-fiber foods, such as fresh vegetables or fruit and whole grains. Drink plenty of fluids to keep your urine clear or pale yellow.  Take warm sitz baths to soothe any pain or discomfort caused by hemorrhoids. Use hemorrhoid cream if your caregiver approves.  If you develop varicose veins, wear support hose. Elevate your feet for 15 minutes, 3 4 times a day. Limit salt in your diet.  Avoid heavy lifting, wear low heal shoes, and practice good posture.  Rest a lot with your legs elevated if you have leg cramps or low back pain.  Visit your dentist if you have not gone during your pregnancy. Use a soft toothbrush to brush your teeth and be gentle when you floss.  A sexual relationship may be continued unless your caregiver directs you otherwise.  Do not travel far distances unless it is absolutely necessary and only with the approval of your caregiver.  Take prenatal classes to understand, practice, and ask questions about the labor and delivery.  Make a trial run to the hospital.  Pack your hospital bag.  Prepare the baby's nursery.  Continue to go to all your prenatal visits as directed  by your caregiver. SEEK MEDICAL CARE IF:  You are unsure if you are in labor or if your water has broken.  You have dizziness.  You have mild pelvic cramps, pelvic pressure, or nagging pain in your abdominal area.  You have persistent nausea, vomiting, or diarrhea.  You have a bad smelling vaginal discharge.  You have pain with urination. SEEK IMMEDIATE MEDICAL CARE IF:   You have a fever.  You are leaking fluid from your vagina.  You have spotting or bleeding from your vagina.  You have severe abdominal cramping or pain.  You have rapid weight loss or gain.  You have shortness of breath with chest pain.  You notice sudden or extreme swelling of your face, hands, ankles, feet, or legs.  You have not felt your baby move in over an hour.  You have severe headaches that do not go away with medicine.  You have vision changes. Document Released: 05/28/2001 Document Revised: 02/03/2013 Document Reviewed:   08/04/2012 [employer] Patient Information 2014 [employer].  Contraception Choices Contraception (birth control) is the use of any methods or devices to prevent pregnancy. Below are some methods to help avoid pregnancy. HORMONAL METHODS   Contraceptive implant This is a thin, plastic tube containing progesterone hormone. It does not contain estrogen hormone. Your health care provider inserts the tube in the inner part of the upper arm. The tube can remain in place for up to 3 years. After 3 years, the implant must be removed. The implant prevents the ovaries from releasing an egg (ovulation), thickens the cervical mucus to prevent sperm from entering the uterus, and thins the lining of the inside of the uterus.  Progesterone-only injections These injections are given every 3 months by your health care provider to prevent pregnancy. This synthetic progesterone hormone stops the ovaries from releasing eggs. It also thickens cervical mucus and changes the uterine lining. This  makes it harder for sperm to survive in the uterus.  Birth control pills These pills contain estrogen and progesterone hormone. They work by preventing the ovaries from releasing eggs (ovulation). They also cause the cervical mucus to thicken, preventing the sperm from entering the uterus. Birth control pills are prescribed by a health care provider.Birth control pills can also be used to treat heavy periods.  Minipill This type of birth control pill contains only the progesterone hormone. They are taken every day of each month and must be prescribed by your health care provider.  Birth control patch The patch contains hormones similar to those in birth control pills. It must be changed once a week and is prescribed by a health care provider.  Vaginal ring The ring contains hormones similar to those in birth control pills. It is left in the vagina for 3 weeks, removed for 1 week, and then a new one is put back in place. The patient must be comfortable inserting and removing the ring from the vagina.A health care provider's prescription is necessary.  Emergency contraception Emergency contraceptives prevent pregnancy after unprotected sexual intercourse. This pill can be taken right after sex or up to 5 days after unprotected sex. It is most effective the sooner you take the pills after having sexual intercourse. Most emergency contraceptive pills are available without a prescription. Check with your pharmacist. Do not use emergency contraception as your only form of birth control. BARRIER METHODS   Female condom This is a thin sheath (latex or rubber) that is worn over the penis during sexual intercourse. It can be used with spermicide to increase effectiveness.  Female condom. This is a soft, loose-fitting sheath that is put into the vagina before sexual intercourse.  Diaphragm This is a soft, latex, dome-shaped barrier that must be fitted by a health care provider. It is inserted into the vagina,  along with a spermicidal jelly. It is inserted before intercourse. The diaphragm should be left in the vagina for 6 to 8 hours after intercourse.  Cervical cap This is a round, soft, latex or plastic cup that fits over the cervix and must be fitted by a health care provider. The cap can be left in place for up to 48 hours after intercourse.  Sponge This is a soft, circular piece of polyurethane foam. The sponge has spermicide in it. It is inserted into the vagina after wetting it and before sexual intercourse.  Spermicides These are chemicals that kill or block sperm from entering the cervix and uterus. They come in the form of creams, jellies,   suppositories, foam, or tablets. They do not require a prescription. They are inserted into the vagina with an applicator before having sexual intercourse. The process must be repeated every time you have sexual intercourse. INTRAUTERINE CONTRACEPTION  Intrauterine device (IUD) This is a T-shaped device that is put in a woman's uterus during a menstrual period to prevent pregnancy. There are 2 types:  Copper IUD This type of IUD is wrapped in copper wire and is placed inside the uterus. Copper makes the uterus and fallopian tubes produce a fluid that kills sperm. It can stay in place for 10 years.  Hormone IUD This type of IUD contains the hormone progestin (synthetic progesterone). The hormone thickens the cervical mucus and prevents sperm from entering the uterus, and it also thins the uterine lining to prevent implantation of a fertilized egg. The hormone can weaken or kill the sperm that get into the uterus. It can stay in place for 3 5 years, depending on which type of IUD is used. PERMANENT METHODS OF CONTRACEPTION  Female tubal ligation This is when the woman's fallopian tubes are surgically sealed, tied, or blocked to prevent the egg from traveling to the uterus.  Hysteroscopic sterilization This involves placing a small coil or insert into each  fallopian tube. Your doctor uses a technique called hysteroscopy to do the procedure. The device causes scar tissue to form. This results in permanent blockage of the fallopian tubes, so the sperm cannot fertilize the egg. It takes about 3 months after the procedure for the tubes to become blocked. You must use another form of birth control for these 3 months.  Female sterilization This is when the female has the tubes that carry sperm tied off (vasectomy).This blocks sperm from entering the vagina during sexual intercourse. After the procedure, the man can still ejaculate fluid (semen). NATURAL PLANNING METHODS  Natural family planning This is not having sexual intercourse or using a barrier method (condom, diaphragm, cervical cap) on days the woman could become pregnant.  Calendar method This is keeping track of the length of each menstrual cycle and identifying when you are fertile.  Ovulation method This is avoiding sexual intercourse during ovulation.  Symptothermal method This is avoiding sexual intercourse during ovulation, using a thermometer and ovulation symptoms.  Post ovulation method This is timing sexual intercourse after you have ovulated. Regardless of which type or method of contraception you choose, it is important that you use condoms to protect against the transmission of sexually transmitted infections (STIs). Talk with your health care provider about which form of contraception is most appropriate for you. Document Released: 06/03/2005 Document Revised: 02/03/2013 Document Reviewed: 11/26/2012 [facility] Patient Information 2014 [place], [place].

## 2013-07-23 NOTE — Progress Notes (Signed)
NST reviewed and reactive. In 2x/wk testing with MFM for SGA--for growth, dopplers and BPP on Monday--they will decide plan of care related to delivery She is a smoker and last baby was 6 lb. GBS and other cultures negative.

## 2013-07-26 ENCOUNTER — Ambulatory Visit (HOSPITAL_COMMUNITY)
Admission: RE | Admit: 2013-07-26 | Discharge: 2013-07-26 | Disposition: A | Discharge status: Home / Self Care | Payer: Medicaid Other | Source: Ambulatory Visit | Attending: Obstetrics & Gynecology | Admitting: Obstetrics & Gynecology

## 2013-07-26 ENCOUNTER — Other Ambulatory Visit: Payer: Self-pay | Admitting: Obstetrics & Gynecology

## 2013-07-26 ENCOUNTER — Encounter: Payer: Self-pay | Admitting: Obstetrics & Gynecology

## 2013-07-26 DIAGNOSIS — O365990 Maternal care for other known or suspected poor fetal growth, unspecified trimester, not applicable or unspecified: Secondary | ICD-10-CM | POA: Insufficient documentation

## 2013-07-26 DIAGNOSIS — O99330 Smoking (tobacco) complicating pregnancy, unspecified trimester: Secondary | ICD-10-CM | POA: Insufficient documentation

## 2013-07-26 NOTE — Progress Notes (Signed)
[facility]  Indication: 28 yr old G3P1011 at 6441w0d with fetal growth restriction for fetal growth and doppler studies.  Findings: 1. Single intrauterine pregnancy. 2. Estimated fetal weight is in the 14th%. 3. Anterior placenta without evidence of previa. 4. Normal amniotic fluid index. 5. The limited anatomy survey is normal. 6. Normal umbilical artery Doppler studies. 7. Normal biophysical profile of 8/8.  Recommendations: 1. Lagging fetal growth: - previously counseled - growth parameters are improved on today's ultrasound but remain lagging - given this would recommend continue antenatal testing and weekly Doppler studies - would recommend delivery at 39 weeks given lagging growth but given parameters have improved- 39 weeks would be recommended; would not recommend going past 39 weeks - recommend fetal kick counts   Eulis FosterKristen Ottilia Pippenger, MD

## 2013-07-29 ENCOUNTER — Ambulatory Visit (INDEPENDENT_AMBULATORY_CARE_PROVIDER_SITE_OTHER): Payer: Medicaid Other | Admitting: Obstetrics & Gynecology

## 2013-07-29 ENCOUNTER — Encounter: Payer: Self-pay | Admitting: Obstetrics & Gynecology

## 2013-07-29 ENCOUNTER — Other Ambulatory Visit: Payer: Self-pay | Admitting: Obstetrics & Gynecology

## 2013-07-29 VITALS — BP 118/68 | Wt 158.0 lb

## 2013-07-29 DIAGNOSIS — O365990 Maternal care for other known or suspected poor fetal growth, unspecified trimester, not applicable or unspecified: Secondary | ICD-10-CM

## 2013-07-29 DIAGNOSIS — IMO0002 Reserved for concepts with insufficient information to code with codable children: Secondary | ICD-10-CM

## 2013-07-29 DIAGNOSIS — O0990 Supervision of high risk pregnancy, unspecified, unspecified trimester: Secondary | ICD-10-CM

## 2013-07-29 DIAGNOSIS — O99210 Obesity complicating pregnancy, unspecified trimester: Secondary | ICD-10-CM

## 2013-07-29 NOTE — Progress Notes (Signed)
Routine visit. NST reviewed and reactive. Good FM. I will schedule IOL at 39 weeks per MFM recommendation. Continue twice weekly monitoring. 08-09-13 at 0730.

## 2013-07-29 NOTE — Progress Notes (Signed)
[id number]  Patient is doing well she did go for dopplers and bpp Monday of this week and said that they recommend delivery at 39 weeks.

## 2013-07-31 ENCOUNTER — Inpatient Hospital Stay (HOSPITAL_COMMUNITY)
Admission: AD | Admit: 2013-07-31 | Discharge: 2013-07-31 | Disposition: A | Discharge status: Home / Self Care | Payer: Medicaid Other | Source: Ambulatory Visit | Attending: Obstetrics & Gynecology | Admitting: Obstetrics & Gynecology

## 2013-07-31 ENCOUNTER — Encounter (HOSPITAL_COMMUNITY): Payer: Self-pay

## 2013-07-31 DIAGNOSIS — M545 Low back pain, unspecified: Secondary | ICD-10-CM | POA: Insufficient documentation

## 2013-07-31 DIAGNOSIS — O365990 Maternal care for other known or suspected poor fetal growth, unspecified trimester, not applicable or unspecified: Secondary | ICD-10-CM | POA: Insufficient documentation

## 2013-07-31 DIAGNOSIS — O99891 Other specified diseases and conditions complicating pregnancy: Secondary | ICD-10-CM | POA: Insufficient documentation

## 2013-07-31 DIAGNOSIS — R109 Unspecified abdominal pain: Secondary | ICD-10-CM | POA: Insufficient documentation

## 2013-07-31 DIAGNOSIS — O9989 Other specified diseases and conditions complicating pregnancy, childbirth and the puerperium: Principal | ICD-10-CM

## 2013-07-31 NOTE — Progress Notes (Signed)
Notified of pt arrival in [facility] and cervical exam. Will give pt the option to stay for 1 hour and be rechecked or go home when we have a reactive strip

## 2013-07-31 NOTE — MAU Note (Signed)
Having pain in lower abd and lower back for 2 days. Worse tonight. Having u/s wkly and nst in [facility] due to measuring small since 34wks. All testing has been good so far

## 2013-07-31 NOTE — Discharge Instructions (Signed)
Fetal Movement Counts °Patient Name: __________________________________________________ Patient Due Date: ____________________ °Performing a fetal movement count is highly recommended in high-risk pregnancies, but it is good for every pregnant woman to do. Your caregiver may ask you to start counting fetal movements at 28 weeks of the pregnancy. Fetal movements often increase: °· After eating a full meal. °· After physical activity. °· After eating or drinking something sweet or cold. °· At rest. °Pay attention to when you feel the baby is most active. This will help you notice a pattern of your baby's sleep and wake cycles and what factors contribute to an increase in fetal movement. It is important to perform a fetal movement count at the same time each day when your baby is normally most active.  °HOW TO COUNT FETAL MOVEMENTS °1. Find a quiet and comfortable area to sit or lie down on your left side. Lying on your left side provides the best blood and oxygen circulation to your baby. °2. Write down the day and time on a sheet of paper or in a journal. °3. Start counting kicks, flutters, swishes, rolls, or jabs in a 2 hour period. You should feel at least 10 movements within 2 hours. °4. If you do not feel 10 movements in 2 hours, wait 2 3 hours and count again. Look for a change in the pattern or not enough counts in 2 hours. °SEEK MEDICAL CARE IF: °· You feel less than 10 counts in 2 hours, tried twice. °· There is no movement in over an hour. °· The pattern is changing or taking longer each day to reach 10 counts in 2 hours. °· You feel the baby is not moving as he or she usually does. °Date: ____________ Movements: ____________ Start time: ____________ Finish time: ____________  °Date: ____________ Movements: ____________ Start time: ____________ Finish time: ____________ °Date: ____________ Movements: ____________ Start time: ____________ Finish time: ____________ °Date: ____________ Movements: ____________  Start time: ____________ Finish time: ____________ °Date: ____________ Movements: ____________ Start time: ____________ Finish time: ____________ °Date: ____________ Movements: ____________ Start time: ____________ Finish time: ____________ °Date: ____________ Movements: ____________ Start time: ____________ Finish time: ____________ °Date: ____________ Movements: ____________ Start time: ____________ Finish time: ____________  °Date: ____________ Movements: ____________ Start time: ____________ Finish time: ____________ °Date: ____________ Movements: ____________ Start time: ____________ Finish time: ____________ °Date: ____________ Movements: ____________ Start time: ____________ Finish time: ____________ °Date: ____________ Movements: ____________ Start time: ____________ Finish time: ____________ °Date: ____________ Movements: ____________ Start time: ____________ Finish time: ____________ °Date: ____________ Movements: ____________ Start time: ____________ Finish time: ____________ °Date: ____________ Movements: ____________ Start time: ____________ Finish time: ____________  °Date: ____________ Movements: ____________ Start time: ____________ Finish time: ____________ °Date: ____________ Movements: ____________ Start time: ____________ Finish time: ____________ °Date: ____________ Movements: ____________ Start time: ____________ Finish time: ____________ °Date: ____________ Movements: ____________ Start time: ____________ Finish time: ____________ °Date: ____________ Movements: ____________ Start time: ____________ Finish time: ____________ °Date: ____________ Movements: ____________ Start time: ____________ Finish time: ____________ °Date: ____________ Movements: ____________ Start time: ____________ Finish time: ____________  °Date: ____________ Movements: ____________ Start time: ____________ Finish time: ____________ °Date: ____________ Movements: ____________ Start time: ____________ Finish time:  ____________ °Date: ____________ Movements: ____________ Start time: ____________ Finish time: ____________ °Date: ____________ Movements: ____________ Start time: ____________ Finish time: ____________ °Date: ____________ Movements: ____________ Start time: ____________ Finish time: ____________ °Date: ____________ Movements: ____________ Start time: ____________ Finish time: ____________ °Date: ____________ Movements: ____________ Start time: ____________ Finish time: ____________  °Date: ____________ Movements: ____________ Start time: ____________ Finish   time: ____________ °Date: ____________ Movements: ____________ Start time: ____________ Finish time: ____________ °Date: ____________ Movements: ____________ Start time: ____________ Finish time: ____________ °Date: ____________ Movements: ____________ Start time: ____________ Finish time: ____________ °Date: ____________ Movements: ____________ Start time: ____________ Finish time: ____________ °Date: ____________ Movements: ____________ Start time: ____________ Finish time: ____________ °Date: ____________ Movements: ____________ Start time: ____________ Finish time: ____________  °Date: ____________ Movements: ____________ Start time: ____________ Finish time: ____________ °Date: ____________ Movements: ____________ Start time: ____________ Finish time: ____________ °Date: ____________ Movements: ____________ Start time: ____________ Finish time: ____________ °Date: ____________ Movements: ____________ Start time: ____________ Finish time: ____________ °Date: ____________ Movements: ____________ Start time: ____________ Finish time: ____________ °Date: ____________ Movements: ____________ Start time: ____________ Finish time: ____________ °Date: ____________ Movements: ____________ Start time: ____________ Finish time: ____________  °Date: ____________ Movements: ____________ Start time: ____________ Finish time: ____________ °Date: ____________ Movements:  ____________ Start time: ____________ Finish time: ____________ °Date: ____________ Movements: ____________ Start time: ____________ Finish time: ____________ °Date: ____________ Movements: ____________ Start time: ____________ Finish time: ____________ °Date: ____________ Movements: ____________ Start time: ____________ Finish time: ____________ °Date: ____________ Movements: ____________ Start time: ____________ Finish time: ____________ °Date: ____________ Movements: ____________ Start time: ____________ Finish time: ____________  °Date: ____________ Movements: ____________ Start time: ____________ Finish time: ____________ °Date: ____________ Movements: ____________ Start time: ____________ Finish time: ____________ °Date: ____________ Movements: ____________ Start time: ____________ Finish time: ____________ °Date: ____________ Movements: ____________ Start time: ____________ Finish time: ____________ °Date: ____________ Movements: ____________ Start time: ____________ Finish time: ____________ °Date: ____________ Movements: ____________ Start time: ____________ Finish time: ____________ °Document Released: 07/03/2006 Document Revised: 05/20/2012 Document Reviewed: 03/30/2012 °[employer]® Patient Information ©2014 [employer]. °Third Trimester of Pregnancy °The third trimester is from week 29 through week 42, months 7 through 9. The third trimester is a time when the fetus is growing rapidly. At the end of the ninth month, the fetus is about 20 inches in length and weighs 6 10 pounds.  °BODY CHANGES °Your body goes through many changes during pregnancy. The changes vary from woman to woman.  °· Your weight will continue to increase. You can expect to gain 25 35 pounds (11 16 kg) by the end of the pregnancy. °· You may begin to get stretch marks on your hips, abdomen, and breasts. °· You may urinate more often because the fetus is moving lower into your pelvis and pressing on your bladder. °· You may develop or  continue to have heartburn as a result of your pregnancy. °· You may develop constipation because certain hormones are causing the muscles that push waste through your intestines to slow down. °· You may develop hemorrhoids or swollen, bulging veins (varicose veins). °· You may have pelvic pain because of the weight gain and pregnancy hormones relaxing your joints between the bones in your pelvis. Back aches may result from over exertion of the muscles supporting your posture. °· Your breasts will continue to grow and be tender. A yellow discharge may leak from your breasts called colostrum. °· Your belly button may stick out. °· You may feel short of breath because of your expanding uterus. °· You may notice the fetus "dropping," or moving lower in your abdomen. °· You may have a bloody mucus discharge. This usually occurs a few days to a week before labor begins. °· Your cervix becomes thin and soft (effaced) near your due date. °WHAT TO EXPECT AT YOUR PRENATAL EXAMS  °You will have prenatal exams every 2 weeks until week 36. Then, you will have weekly   prenatal exams. During a routine prenatal visit: °· You will be weighed to make sure you and the fetus are growing normally. °· Your blood pressure is taken. °· Your abdomen will be measured to track your baby's growth. °· The fetal heartbeat will be listened to. °· Any test results from the previous visit will be discussed. °· You may have a cervical check near your due date to see if you have effaced. °At around 36 weeks, your caregiver will check your cervix. At the same time, your caregiver will also perform a test on the secretions of the vaginal tissue. This test is to determine if a type of bacteria, Group B streptococcus, is present. Your caregiver will explain this further. °Your caregiver may ask you: °· What your birth plan is. °· How you are feeling. °· If you are feeling the baby move. °· If you have had any abnormal symptoms, such as leaking fluid,  bleeding, severe headaches, or abdominal cramping. °· If you have any questions. °Other tests or screenings that may be performed during your third trimester include: °· Blood tests that check for low iron levels (anemia). °· Fetal testing to check the health, activity level, and growth of the fetus. Testing is done if you have certain medical conditions or if there are problems during the pregnancy. °FALSE LABOR °You may feel small, irregular contractions that eventually go away. These are called Braxton Hicks contractions, or false labor. Contractions may last for hours, days, or even weeks before true labor sets in. If contractions come at regular intervals, intensify, or become painful, it is best to be seen by your caregiver.  °SIGNS OF LABOR  °· Menstrual-like cramps. °· Contractions that are 5 minutes apart or less. °· Contractions that start on the top of the uterus and spread down to the lower abdomen and back. °· A sense of increased pelvic pressure or back pain. °· A watery or bloody mucus discharge that comes from the vagina. °If you have any of these signs before the 37th week of pregnancy, call your caregiver right away. You need to go to the hospital to get checked immediately. °HOME CARE INSTRUCTIONS  °· Avoid all smoking, herbs, alcohol, and unprescribed drugs. These chemicals affect the formation and growth of the baby. °· Follow your caregiver's instructions regarding medicine use. There are medicines that are either safe or unsafe to take during pregnancy. °· Exercise only as directed by your caregiver. Experiencing uterine cramps is a good sign to stop exercising. °· Continue to eat regular, healthy meals. °· Wear a good support bra for breast tenderness. °· Do not use hot tubs, steam rooms, or saunas. °· Wear your seat belt at all times when driving. °· Avoid raw meat, uncooked cheese, cat litter boxes, and soil used by cats. These carry germs that can cause birth defects in the baby. °· Take  your prenatal vitamins. °· Try taking a stool softener (if your caregiver approves) if you develop constipation. Eat more high-fiber foods, such as fresh vegetables or fruit and whole grains. Drink plenty of fluids to keep your urine clear or pale yellow. °· Take warm sitz baths to soothe any pain or discomfort caused by hemorrhoids. Use hemorrhoid cream if your caregiver approves. °· If you develop varicose veins, wear support hose. Elevate your feet for 15 minutes, 3 4 times a day. Limit salt in your diet. °· Avoid heavy lifting, wear low heal shoes, and practice good posture. °· Rest a lot with your   legs elevated if you have leg cramps or low back pain. °· Visit your dentist if you have not gone during your pregnancy. Use a soft toothbrush to brush your teeth and be gentle when you floss. °· A sexual relationship may be continued unless your caregiver directs you otherwise. °· Do not travel far distances unless it is absolutely necessary and only with the approval of your caregiver. °· Take prenatal classes to understand, practice, and ask questions about the labor and delivery. °· Make a trial run to the hospital. °· Pack your hospital bag. °· Prepare the baby's nursery. °· Continue to go to all your prenatal visits as directed by your caregiver. °SEEK MEDICAL CARE IF: °· You are unsure if you are in labor or if your water has broken. °· You have dizziness. °· You have mild pelvic cramps, pelvic pressure, or nagging pain in your abdominal area. °· You have persistent nausea, vomiting, or diarrhea. °· You have a bad smelling vaginal discharge. °· You have pain with urination. °SEEK IMMEDIATE MEDICAL CARE IF:  °· You have a fever. °· You are leaking fluid from your vagina. °· You have spotting or bleeding from your vagina. °· You have severe abdominal cramping or pain. °· You have rapid weight loss or gain. °· You have shortness of breath with chest pain. °· You notice sudden or extreme swelling of your face,  hands, ankles, feet, or legs. °· You have not felt your baby move in over an hour. °· You have severe headaches that do not go away with medicine. °· You have vision changes. °Document Released: 05/28/2001 Document Revised: 02/03/2013 Document Reviewed: 08/04/2012 °[employer]® Patient Information ©2014 [employer]. °Braxton Hicks Contractions °Pregnancy is commonly associated with contractions of the uterus throughout the pregnancy. Towards the end of pregnancy (32 to 34 weeks), these contractions (Braxton Hicks) can develop more often and may become more forceful. This is not true labor because these contractions do not result in opening (dilatation) and thinning of the cervix. They are sometimes difficult to tell apart from true labor because these contractions can be forceful and people have different pain tolerances. You should not feel embarrassed if you go to the hospital with false labor. Sometimes, the only way to tell if you are in true labor is for your caregiver to follow the changes in the cervix. °How to tell the difference between true and false labor: °· False labor. °· The contractions of false labor are usually shorter, irregular and not as hard as those of true labor. °· They are often felt in the front of the lower abdomen and in the groin. °· They may leave with walking around or changing positions while lying down. °· They get weaker and are shorter lasting as time goes on. °· These contractions are usually irregular. °· They do not usually become progressively stronger, regular and closer together as with true labor. °· True labor. °· Contractions in true labor last 30 to 70 seconds, become very regular, usually become more intense, and increase in frequency. °· They do not go away with walking. °· The discomfort is usually felt in the top of the uterus and spreads to the lower abdomen and low back. °· True labor can be determined by your caregiver with an exam. This will show that the cervix  is dilating and getting thinner. °If there are no prenatal problems or other health problems associated with the pregnancy, it is completely safe to be sent home with false   labor and await the onset of true labor. °HOME CARE INSTRUCTIONS  °· Keep up with your usual exercises and instructions. °· Take medications as directed. °· Keep your regular prenatal appointment. °· Eat and drink lightly if you think you are going into labor. °· If BH contractions are making you uncomfortable: °· Change your activity position from lying down or resting to walking/walking to resting. °· Sit and rest in a tub of warm water. °· Drink 2 to 3 glasses of water. Dehydration may cause B-H contractions. °· Do slow and deep breathing several times an hour. °SEEK IMMEDIATE MEDICAL CARE IF:  °· Your contractions continue to become stronger, more regular, and closer together. °· You have a gushing, burst or leaking of fluid from the vagina. °· An oral temperature above 102° F (38.9° C) develops. °· You have passage of blood-tinged mucus. °· You develop vaginal bleeding. °· You develop continuous belly (abdominal) pain. °· You have low back pain that you never had before. °· You feel the baby's head pushing down causing pelvic pressure. °· The baby is not moving as much as it used to. °Document Released: 06/03/2005 Document Revised: 08/26/2011 Document Reviewed: 03/15/2013 °[employer]® Patient Information ©2014 [employer]. ° °

## 2013-08-02 ENCOUNTER — Other Ambulatory Visit: Payer: Self-pay | Admitting: Obstetrics & Gynecology

## 2013-08-02 ENCOUNTER — Ambulatory Visit (HOSPITAL_COMMUNITY)
Admission: RE | Admit: 2013-08-02 | Discharge: 2013-08-02 | Disposition: A | Discharge status: Home / Self Care | Payer: Medicaid Other | Source: Ambulatory Visit | Attending: Family Medicine | Admitting: Family Medicine

## 2013-08-02 ENCOUNTER — Ambulatory Visit (HOSPITAL_COMMUNITY)
Admission: RE | Admit: 2013-08-02 | Discharge: 2013-08-02 | Disposition: A | Discharge status: Home / Self Care | Payer: Medicaid Other | Source: Ambulatory Visit | Attending: Obstetrics & Gynecology | Admitting: Obstetrics & Gynecology

## 2013-08-02 ENCOUNTER — Encounter (HOSPITAL_COMMUNITY): Payer: Self-pay | Admitting: *Deleted

## 2013-08-02 ENCOUNTER — Inpatient Hospital Stay (HOSPITAL_COMMUNITY)
Admission: AD | Admit: 2013-08-02 | Discharge: 2013-08-02 | Disposition: A | Discharge status: Home / Self Care | Payer: Medicaid Other | Source: Ambulatory Visit | Attending: Obstetrics & Gynecology | Admitting: Obstetrics & Gynecology

## 2013-08-02 DIAGNOSIS — O99210 Obesity complicating pregnancy, unspecified trimester: Secondary | ICD-10-CM

## 2013-08-02 DIAGNOSIS — O365990 Maternal care for other known or suspected poor fetal growth, unspecified trimester, not applicable or unspecified: Secondary | ICD-10-CM | POA: Insufficient documentation

## 2013-08-02 DIAGNOSIS — O479 False labor, unspecified: Secondary | ICD-10-CM | POA: Insufficient documentation

## 2013-08-02 DIAGNOSIS — O99330 Smoking (tobacco) complicating pregnancy, unspecified trimester: Secondary | ICD-10-CM | POA: Insufficient documentation

## 2013-08-02 DIAGNOSIS — IMO0002 Reserved for concepts with insufficient information to code with codable children: Secondary | ICD-10-CM

## 2013-08-02 NOTE — MAU Note (Signed)
Been having a lot of ctx's today, now feeling more pressure- makes it hard to stand or walk. No bleeding or leaking. Was one cm when last checked. < 10 min apart

## 2013-08-02 NOTE — Discharge Instructions (Signed)

## 2013-08-04 ENCOUNTER — Telehealth (HOSPITAL_COMMUNITY): Payer: Self-pay | Admitting: *Deleted

## 2013-08-04 NOTE — Telephone Encounter (Signed)
Preadmission screen  

## 2013-08-05 ENCOUNTER — Inpatient Hospital Stay (HOSPITAL_COMMUNITY)
Admission: AD | Admit: 2013-08-05 | Discharge: 2013-08-05 | Disposition: A | Discharge status: Home / Self Care | Payer: Medicaid Other | Source: Ambulatory Visit | Attending: Obstetrics & Gynecology | Admitting: Obstetrics & Gynecology

## 2013-08-05 ENCOUNTER — Encounter (HOSPITAL_COMMUNITY): Payer: Self-pay | Admitting: *Deleted

## 2013-08-05 DIAGNOSIS — O9989 Other specified diseases and conditions complicating pregnancy, childbirth and the puerperium: Secondary | ICD-10-CM

## 2013-08-05 DIAGNOSIS — F329 Major depressive disorder, single episode, unspecified: Secondary | ICD-10-CM | POA: Insufficient documentation

## 2013-08-05 DIAGNOSIS — Z283 Underimmunization status: Secondary | ICD-10-CM

## 2013-08-05 DIAGNOSIS — Z87891 Personal history of nicotine dependence: Secondary | ICD-10-CM | POA: Insufficient documentation

## 2013-08-05 DIAGNOSIS — O365990 Maternal care for other known or suspected poor fetal growth, unspecified trimester, not applicable or unspecified: Secondary | ICD-10-CM

## 2013-08-05 DIAGNOSIS — O99330 Smoking (tobacco) complicating pregnancy, unspecified trimester: Secondary | ICD-10-CM

## 2013-08-05 DIAGNOSIS — F3289 Other specified depressive episodes: Secondary | ICD-10-CM | POA: Insufficient documentation

## 2013-08-05 DIAGNOSIS — O479 False labor, unspecified: Secondary | ICD-10-CM | POA: Insufficient documentation

## 2013-08-05 DIAGNOSIS — O0990 Supervision of high risk pregnancy, unspecified, unspecified trimester: Secondary | ICD-10-CM

## 2013-08-05 DIAGNOSIS — O09899 Supervision of other high risk pregnancies, unspecified trimester: Secondary | ICD-10-CM

## 2013-08-05 DIAGNOSIS — O99340 Other mental disorders complicating pregnancy, unspecified trimester: Secondary | ICD-10-CM | POA: Insufficient documentation

## 2013-08-05 LAB — URINALYSIS, ROUTINE W REFLEX MICROSCOPIC
BILIRUBIN URINE: NEGATIVE
Glucose, UA: NEGATIVE mg/dL
HGB URINE DIPSTICK: NEGATIVE
KETONES UR: NEGATIVE mg/dL
LEUKOCYTES UA: NEGATIVE
Nitrite: NEGATIVE
PH: 6.5 (ref 5.0–8.0)
PROTEIN: NEGATIVE mg/dL
Specific Gravity, Urine: <1.005 — ABNORMAL LOW (ref 1.005–1.030)
Urobilinogen, UA: 0.2 mg/dL (ref 0.0–1.0)

## 2013-08-05 NOTE — Discharge Instructions (Signed)

## 2013-08-05 NOTE — MAU Provider Note (Signed)
  History    CSN: [id number]  Arrival date and time: 08/05/13 1633   None     Chief Complaint  Patient presents with  . Labor Eval   HPI Comments: Report lose of mucus plug and previous contraction but c urrently not having contractions. Denies LOF.   Past Medical History  Diagnosis Date  . Back pain   . Obesity   . Depression     no meds since pregancny  . Abnormal Pap smear     repeat WNL  . PONV (postoperative nausea and vomiting)   . Injury of tendon of left rotator cuff     Past Surgical History  Procedure Laterality Date  . Tubes in ears  age 89  . Tonsillectomy      age 28    Family History  Problem Relation Age of Onset  . Depression Mother   . Diabetes Mother   . Hypertension Mother   . COPD Mother   . Anesthesia problems Mother   . Depression Sister   . Diabetes Sister   . Kidney disease Sister   . Hypertension Sister   . Hypertension Father   . Anesthesia problems Father     History  Substance Use Topics  . Smoking status: Former Smoker -- 0.25 packs/day for 6 years    Types: Cigarettes  . Smokeless tobacco: Never Used  . Alcohol Use: No    Allergies:  Allergies  Allergen Reactions  . Cephalexin Itching and Nausea And Vomiting  . Percocet [Oxycodone-Acetaminophen] Nausea And Vomiting    Prescriptions prior to admission  Medication Sig Dispense Refill  . acetaminophen (TYLENOL) 500 MG tablet Take 500 mg by mouth as needed for mild pain.       Marland Kitchen. acetaminophen-codeine (TYLENOL #3) 300-30 MG per tablet Take 1-2 tablets by mouth every 4 (four) hours as needed for moderate pain.      . nicotine (NICODERM CQ - DOSED IN MG/24 HOURS) 21 mg/24hr patch Place 21 mg onto the skin daily.      . Prenatal Vit-Fe Fumarate-FA (PRENATAL MULTIVITAMIN) TABS tablet Take 1 tablet by mouth daily at 12 noon.       ROS Physical Exam   Blood pressure 108/63, pulse 79, temperature 98.1 F (36.7 C), resp. rate 18, height 5\' 1"  (1.549 m), weight 71.668 kg (158  lb), last menstrual period 10/15/2012.  Dilation: 1 Effacement (%): 50 Station: -3 Presentation: Vertex Exam by:: Dr Gayla DossJoyner  Physical Exam  MAU Course  Procedures   Assessment and Plan  Contraction stopped. No LOF of Cervical change - FWB: Cat 1 tracing - Discharge Home  Maureen Perez, Maureen Perez 08/05/2013, 5:49 PM

## 2013-08-05 NOTE — MAU Note (Signed)
States that she lost her mucous plug around 1430 and has been having contractions ever since;

## 2013-08-06 ENCOUNTER — Ambulatory Visit (INDEPENDENT_AMBULATORY_CARE_PROVIDER_SITE_OTHER): Payer: Medicaid Other | Admitting: Obstetrics & Gynecology

## 2013-08-06 ENCOUNTER — Encounter: Payer: Self-pay | Admitting: Obstetrics & Gynecology

## 2013-08-06 VITALS — BP 136/86 | Wt 158.0 lb

## 2013-08-06 DIAGNOSIS — O0990 Supervision of high risk pregnancy, unspecified, unspecified trimester: Secondary | ICD-10-CM

## 2013-08-06 DIAGNOSIS — O365990 Maternal care for other known or suspected poor fetal growth, unspecified trimester, not applicable or unspecified: Secondary | ICD-10-CM

## 2013-08-06 NOTE — Addendum Note (Signed)
Addended by: Tandy GawHINTON, Lanaysia Fritchman C on: 08/06/2013 11:30 AM   Modules accepted: Orders

## 2013-08-06 NOTE — Progress Notes (Signed)
P=[id number] 

## 2013-08-06 NOTE — Progress Notes (Signed)
Routine visit. Good FM. No problems except some contraction. Labor precautions reviewed. NST reactive. IOL 08-09-13.

## 2013-08-09 ENCOUNTER — Encounter (HOSPITAL_COMMUNITY): Payer: Self-pay

## 2013-08-09 ENCOUNTER — Inpatient Hospital Stay (HOSPITAL_COMMUNITY)
Admission: RE | Admit: 2013-08-09 | Discharge: 2013-08-11 | DRG: 775 | Disposition: A | Discharge status: Home / Self Care | Payer: Medicaid Other | Source: Ambulatory Visit | Attending: Obstetrics and Gynecology | Admitting: Obstetrics and Gynecology

## 2013-08-09 ENCOUNTER — Encounter (HOSPITAL_COMMUNITY): Payer: Medicaid Other | Admitting: Anesthesiology

## 2013-08-09 ENCOUNTER — Inpatient Hospital Stay (HOSPITAL_COMMUNITY): Payer: Medicaid Other | Admitting: Anesthesiology

## 2013-08-09 VITALS — BP 116/76 | HR 51 | Temp 97.6°F | Resp 18 | Ht 61.0 in | Wt 158.0 lb

## 2013-08-09 DIAGNOSIS — O365990 Maternal care for other known or suspected poor fetal growth, unspecified trimester, not applicable or unspecified: Secondary | ICD-10-CM

## 2013-08-09 DIAGNOSIS — F3289 Other specified depressive episodes: Secondary | ICD-10-CM

## 2013-08-09 DIAGNOSIS — O9912 Other diseases of the blood and blood-forming organs and certain disorders involving the immune mechanism complicating childbirth: Secondary | ICD-10-CM

## 2013-08-09 DIAGNOSIS — F329 Major depressive disorder, single episode, unspecified: Secondary | ICD-10-CM | POA: Diagnosis present

## 2013-08-09 DIAGNOSIS — K219 Gastro-esophageal reflux disease without esophagitis: Secondary | ICD-10-CM | POA: Diagnosis present

## 2013-08-09 DIAGNOSIS — O99344 Other mental disorders complicating childbirth: Secondary | ICD-10-CM

## 2013-08-09 DIAGNOSIS — Z87891 Personal history of nicotine dependence: Secondary | ICD-10-CM

## 2013-08-09 DIAGNOSIS — O0990 Supervision of high risk pregnancy, unspecified, unspecified trimester: Secondary | ICD-10-CM

## 2013-08-09 DIAGNOSIS — D689 Coagulation defect, unspecified: Secondary | ICD-10-CM

## 2013-08-09 DIAGNOSIS — O09899 Supervision of other high risk pregnancies, unspecified trimester: Secondary | ICD-10-CM

## 2013-08-09 DIAGNOSIS — Z283 Underimmunization status: Secondary | ICD-10-CM

## 2013-08-09 DIAGNOSIS — O9989 Other specified diseases and conditions complicating pregnancy, childbirth and the puerperium: Secondary | ICD-10-CM

## 2013-08-09 DIAGNOSIS — O99330 Smoking (tobacco) complicating pregnancy, unspecified trimester: Secondary | ICD-10-CM

## 2013-08-09 LAB — CBC
HEMATOCRIT: 33.3 % — AB (ref 36.0–46.0)
Hemoglobin: 10.9 g/dL — ABNORMAL LOW (ref 12.0–15.0)
MCH: 29.4 pg (ref 26.0–34.0)
MCHC: 32.7 g/dL (ref 30.0–36.0)
MCV: 89.8 fL (ref 78.0–100.0)
Platelets: 273 10*3/uL (ref 150–400)
RBC: 3.71 MIL/uL — ABNORMAL LOW (ref 3.87–5.11)
RDW: 14.2 % (ref 11.5–15.5)
WBC: 9.3 10*3/uL (ref 4.0–10.5)

## 2013-08-09 LAB — RPR: RPR Ser Ql: NONREACTIVE

## 2013-08-09 MED ORDER — LIDOCAINE HCL (PF) 1 % IJ SOLN
INTRAMUSCULAR | Status: DC | PRN
  Administered 2013-08-09 (×2): 4 mL

## 2013-08-09 MED ORDER — LACTATED RINGERS IV SOLN
500.0000 mL | Freq: Once | INTRAVENOUS | Status: AC
  Administered 2013-08-09: 500 mL via INTRAVENOUS

## 2013-08-09 MED ORDER — CITRIC ACID-SODIUM CITRATE 334-500 MG/5ML PO SOLN: 30.0000 mL | ORAL | Status: DC | PRN

## 2013-08-09 MED ORDER — LACTATED RINGERS IV SOLN: 500.0000 mL | INTRAVENOUS | Status: DC | PRN

## 2013-08-09 MED ORDER — OXYTOCIN 40 UNITS IN LACTATED RINGERS INFUSION - SIMPLE MED
62.5000 mL/h | INTRAVENOUS | Status: DC
  Administered 2013-08-10: 62.5 mL/h via INTRAVENOUS
  Filled 2013-08-09: qty 1000

## 2013-08-09 MED ORDER — ONDANSETRON HCL 4 MG/2ML IJ SOLN: 4.0000 mg | Freq: Four times a day (QID) | INTRAMUSCULAR | Status: DC | PRN

## 2013-08-09 MED ORDER — OXYCODONE-ACETAMINOPHEN 5-325 MG PO TABS: 1.0000 | ORAL_TABLET | ORAL | Status: DC | PRN

## 2013-08-09 MED ORDER — OXYTOCIN 40 UNITS IN LACTATED RINGERS INFUSION - SIMPLE MED
1.0000 m[IU]/min | INTRAVENOUS | Status: DC
  Administered 2013-08-09: 2 m[IU]/min via INTRAVENOUS

## 2013-08-09 MED ORDER — LIDOCAINE HCL (PF) 1 % IJ SOLN
30.0000 mL | INTRAMUSCULAR | Status: DC | PRN
  Filled 2013-08-09: qty 30

## 2013-08-09 MED ORDER — TERBUTALINE SULFATE 1 MG/ML IJ SOLN: 0.2500 mg | Freq: Once | INTRAMUSCULAR | Status: AC | PRN

## 2013-08-09 MED ORDER — FENTANYL 2.5 MCG/ML BUPIVACAINE 1/10 % EPIDURAL INFUSION (WH - ANES)
14.0000 mL/h | INTRAMUSCULAR | Status: DC | PRN
  Filled 2013-08-09: qty 125

## 2013-08-09 MED ORDER — ACETAMINOPHEN 325 MG PO TABS: 650.0000 mg | ORAL_TABLET | ORAL | Status: DC | PRN

## 2013-08-09 MED ORDER — MISOPROSTOL 200 MCG PO TABS
ORAL_TABLET | ORAL | Status: AC
  Filled 2013-08-09: qty 5

## 2013-08-09 MED ORDER — MISOPROSTOL 25 MCG QUARTER TABLET
25.0000 ug | ORAL_TABLET | ORAL | Status: DC | PRN
  Administered 2013-08-09 (×2): 25 ug via VAGINAL
  Filled 2013-08-09: qty 0.25
  Filled 2013-08-09: qty 1
  Filled 2013-08-09: qty 0.25

## 2013-08-09 MED ORDER — OXYTOCIN BOLUS FROM INFUSION
500.0000 mL | INTRAVENOUS | Status: DC
  Administered 2013-08-09: 500 mL via INTRAVENOUS

## 2013-08-09 MED ORDER — FENTANYL 2.5 MCG/ML BUPIVACAINE 1/10 % EPIDURAL INFUSION (WH - ANES)
INTRAMUSCULAR | Status: DC | PRN
  Administered 2013-08-09: 12 mL/h via EPIDURAL

## 2013-08-09 MED ORDER — PHENYLEPHRINE 40 MCG/ML (10ML) SYRINGE FOR IV PUSH (FOR BLOOD PRESSURE SUPPORT)
80.0000 ug | PREFILLED_SYRINGE | INTRAVENOUS | Status: DC | PRN
  Filled 2013-08-09: qty 2

## 2013-08-09 MED ORDER — EPHEDRINE 5 MG/ML INJ
10.0000 mg | INTRAVENOUS | Status: DC | PRN
  Filled 2013-08-09: qty 2

## 2013-08-09 MED ORDER — PHENYLEPHRINE 40 MCG/ML (10ML) SYRINGE FOR IV PUSH (FOR BLOOD PRESSURE SUPPORT)
80.0000 ug | PREFILLED_SYRINGE | INTRAVENOUS | Status: DC | PRN
  Filled 2013-08-09: qty 2
  Filled 2013-08-09: qty 10

## 2013-08-09 MED ORDER — LACTATED RINGERS IV SOLN
INTRAVENOUS | Status: DC
  Administered 2013-08-09 (×3): via INTRAVENOUS

## 2013-08-09 MED ORDER — IBUPROFEN 600 MG PO TABS
600.0000 mg | ORAL_TABLET | Freq: Four times a day (QID) | ORAL | Status: DC | PRN
  Administered 2013-08-10: 600 mg via ORAL
  Filled 2013-08-09: qty 1

## 2013-08-09 MED ORDER — EPHEDRINE 5 MG/ML INJ
10.0000 mg | INTRAVENOUS | Status: DC | PRN
  Filled 2013-08-09: qty 2
  Filled 2013-08-09: qty 4

## 2013-08-09 MED ORDER — DIPHENHYDRAMINE HCL 50 MG/ML IJ SOLN: 12.5000 mg | INTRAMUSCULAR | Status: DC | PRN

## 2013-08-09 MED ORDER — MISOPROSTOL 200 MCG PO TABS
1000.0000 ug | ORAL_TABLET | Freq: Once | ORAL | Status: AC
  Administered 2013-08-09: 1000 ug via RECTAL

## 2013-08-09 MED ORDER — FENTANYL CITRATE 0.05 MG/ML IJ SOLN
100.0000 ug | INTRAMUSCULAR | Status: DC | PRN
  Administered 2013-08-09 (×3): 100 ug via INTRAVENOUS
  Filled 2013-08-09 (×3): qty 2

## 2013-08-09 NOTE — Anesthesia Preprocedure Evaluation (Signed)
Anesthesia Evaluation  Patient identified by MRN, date of birth, ID band Patient awake    Reviewed: Allergy & Precautions, H&P , Patient's Chart, lab work & pertinent test results  History of Anesthesia Complications (+) PONV and history of anesthetic complications  Airway Mallampati: III TM Distance: >3 FB Neck ROM: Full    Dental no notable dental hx. (+) Teeth Intact   Pulmonary former smoker,  breath sounds clear to auscultation  Pulmonary exam normal       Cardiovascular negative cardio ROS  Rhythm:Regular Rate:Normal     Neuro/Psych PSYCHIATRIC DISORDERS Depression    GI/Hepatic Neg liver ROS, GERD-  Controlled and Medicated,  Endo/Other  negative endocrine ROS  Renal/GU negative Renal ROS  negative genitourinary   Musculoskeletal   Abdominal   Peds  Hematology  (+) Blood dyscrasia, ,   Anesthesia Other Findings   Reproductive/Obstetrics (+) Pregnancy                           Anesthesia Physical Anesthesia Plan  ASA: II  Anesthesia Plan: Epidural   Post-op Pain Management:    Induction:   Airway Management Planned: Natural Airway  Additional Equipment:   Intra-op Plan:   Post-operative Plan:   Informed Consent: I have reviewed the patients History and Physical, chart, labs and discussed the procedure including the risks, benefits and alternatives for the proposed anesthesia with the patient or authorized representative who has indicated his/her understanding and acceptance.     Plan Discussed with: Anesthesiologist  Anesthesia Plan Comments:         Anesthesia Quick Evaluation

## 2013-08-09 NOTE — Anesthesia Procedure Notes (Signed)
Epidural Patient location during procedure: OB Start time: 08/09/2013 9:21 PM  Staffing Anesthesiologist: Travers Goodley A. Performed by: anesthesiologist   Preanesthetic Checklist Completed: patient identified, site marked, surgical consent, pre-op evaluation, timeout performed, IV checked, risks and benefits discussed and monitors and equipment checked  Epidural Patient position: sitting Prep: site prepped and draped and DuraPrep Patient monitoring: continuous pulse ox and blood pressure Approach: midline Injection technique: LOR air  Needle:  Needle type: Tuohy  Needle gauge: 17 G Needle length: 9 cm and 9 Needle insertion depth: 4 cm Catheter type: closed end flexible Catheter size: 19 Gauge Catheter at skin depth: 9 cm Test dose: negative and Other  Assessment Events: blood not aspirated, injection not painful, no injection resistance, negative IV test and no paresthesia  Additional Notes Patient identified. Risks and benefits discussed including failed block, incomplete  Pain control, post dural puncture headache, nerve damage, paralysis, blood pressure Changes, nausea, vomiting, reactions to medications-both toxic and allergic and post Partum back pain. All questions were answered. Patient expressed understanding and wished to proceed. Sterile technique was used throughout procedure. Epidural site was Dressed with sterile barrier dressing. No paresthesias, signs of intravascular injection Or signs of intrathecal spread were encountered.  Patient was more comfortable after the epidural was dosed. Please see RN's note for documentation of vital signs and FHR which are stable.

## 2013-08-09 NOTE — Progress Notes (Signed)
Patient ID: Maureen LipaMelissa D Perez, female   DOB: 09/06/1985, 28 y.o.   MRN: [id number] Maureen Perez is a 28 y.o. G3P1011 at 2060w0d admitted for IOL for fetal growth lag  Subjective: Comfortable after Fentanyl at 1500. Eating.   Objective: BP 112/63  Pulse 70  Temp(Src) 98.4 F (36.9 C) (Oral)  Resp 18  Ht 5\' 1"  (1.549 m)  Wt 71.668 kg (158 lb)  BMI 29.87 kg/m2  LMP 10/15/2012  Fetal Heart FHR: 130 bpm, variability: moderate,  accelerations:  Present,  decelerations:  Absent   Contractions: q 2-4  SVE:   Dilation: 2.5 Effacement (%): 70 Station: -2 Exam by:: Avram Danielson, CNM Cx post, soft Foley bulb placed  Assessment / Plan:  Labor: Early  Fetal Wellbeing: Cat 1 Pain Control:  adequate Expected mode of delivery: NSVD  Fernando Stoiber 08/09/2013, 5:41 PM

## 2013-08-09 NOTE — H&P (Signed)
Maureen Perez is a 28 y.o. female presenting for IOL for SGA. Maternal Medical History:  Reason for admission: Nausea.     Maureen Perez is a 28 y.o. G3P1011 at 5456w0d presenting for IOL for SGA. She reports possibly losing her mucous plug at 2:30 pm on 08/05/13. She is having only very mild contractions and denies LOF and VB. She reports good fetal movement today. She is GBS negative and is having a girl. She plans for IV fentanyl and epidural for pain control. She has no history of abdominal surgery.  She is a former smoker (quit during this pregnancy).  FMHX is significant for DM and HTN.    OB History   Grav Para Term Preterm Abortions TAB SAB Ect Mult Living   3 1 1  0 1 0 1 0 0 1     Past Medical History  Diagnosis Date  . Back pain   . Obesity   . Depression     no meds since pregancny  . Abnormal Pap smear     repeat WNL  . PONV (postoperative nausea and vomiting)   . Injury of tendon of left rotator cuff    Past Surgical History  Procedure Laterality Date  . Tubes in ears  age 89  . Tonsillectomy      age 28   Family History: family history includes Anesthesia problems in her father and mother; COPD in her mother; Depression in her mother and sister; Diabetes in her mother and sister; Hypertension in her father, mother, and sister; Kidney disease in her sister. Social History:  reports that she has quit smoking. Her smoking use included Cigarettes. She has a 1.5 pack-year smoking history. She has never used smokeless tobacco. She reports that she does not drink alcohol or use illicit drugs.   Prenatal Transfer Tool  Maternal Diabetes: No Genetic Screening: Normal Maternal Ultrasounds/Referrals: Normal Fetal Ultrasounds or other Referrals:  None Maternal Substance Abuse:  No Significant Maternal Medications:  None Significant Maternal Lab Results:  Lab values include: Group B Strep negative Other Comments:  None  Review of Systems  Constitutional: Negative for  fever and chills.  Respiratory: Negative for cough and shortness of breath.   Cardiovascular: Negative for chest pain and palpitations.  Gastrointestinal: Negative for heartburn, nausea, vomiting, abdominal pain, diarrhea and constipation.  Genitourinary: Negative for dysuria, urgency and hematuria.  Neurological: Negative for dizziness, weakness and headaches.    Dilation: 1 Effacement (%): 40 Station: -3 Exam by:: Renaldo HarrisonGoss, RN Blood pressure 132/77, pulse 87, temperature 97.2 F (36.2 C), temperature source Oral, resp. rate 18, height 5\' 1"  (1.549 m), weight 71.668 kg (158 lb), last menstrual period 10/15/2012. Exam Physical Exam  Constitutional: She is oriented to person, place, and time. She appears well-developed and well-nourished.  Eyes: EOM are normal.  Neck: No JVD present.  Cardiovascular: Normal rate and regular rhythm.   Respiratory: Effort normal and breath sounds normal.  GI: Soft. Bowel sounds are normal.  Musculoskeletal: She exhibits no edema.  Neurological: She is alert and oriented to person, place, and time.  Skin: Skin is warm and dry.  Psychiatric: She has a normal mood and affect.    Prenatal labs: ABO, Rh: O/POS/-- (07/09 1424) Antibody: NEG (07/09 1424) Rubella: 0.80 (07/09 1424) RPR: NON REAC (12/03 1022)  HBsAg: NEGATIVE (07/09 1424)  HIV: NON REACTIVE (12/03 1022)  GBS: Negative (01/30 0000)   GTT: 1 hr = 121 on 05/19/13   FROM 07/26/13:[facility]  Indication: 28 yr old G3P1011 at [redacted]w[redacted]d with fetal growth restriction for fetal growth and doppler studies.  Findings:  1. Single intrauterine pregnancy.  2. Estimated fetal weight is in the 14th%.  3. Anterior placenta without evidence of previa.  4. Normal amniotic fluid index.  5. The limited anatomy survey is normal.  6. Normal umbilical artery Doppler studies.  7. Normal biophysical profile of 8/8.   Recommendations:  1. Lagging fetal growth:  - previously counseled  - growth  parameters are improved on today's ultrasound but remain lagging  - given this would recommend continue antenatal testing and weekly Doppler studies  - would recommend delivery at 39 weeks given lagging growth but given parameters have improved- 39 weeks would be recommended; would not recommend going past 39 weeks  - recommend fetal kick counts    Assessment/Plan: IOL for SGA  Begin cytotec. Recheck in 4 hrs to consider foley bulb.    Duane Boston 08/09/2013, 9:16 AM   Evaluation and management procedures were performed by PA-S under my supervision/collaboration. Chart reviewed, patient examined by me and I agree with management and plan. G3P1011 at 39.0 here for IOL d/t growth lag (42nd>14th). S/P cytotec x 1> recheck 1 hr

## 2013-08-09 NOTE — Progress Notes (Signed)
Maureen Perez is a 28 y.o. G3P1011 at [redacted]w[redacted]d admitted for induction of labor due to Poor fetal growth.  Subjective:  Patient just got an epidural. Feeling well. No complaints. Pain is well controlled at this time.   Objective: BP 124/79  Pulse 57  Temp(Src) 98.5 F (36.9 C) (Oral)  Resp 20  Ht 5\' 1"  (1.549 m)  Wt 71.668 kg (158 lb)  BMI 29.87 kg/m2  SpO2 99%  LMP 10/15/2012      FHT:  FHR: 145 bpm, variability: moderate,  accelerations:  Present,  decelerations:  Absent UC:   regular, every 2-3 minutes SVE:   Dilation: 5 Effacement (%): 50 Station: -2 Exam by:: Jazelyn Sipe CNM  Labs: Lab Results  Component Value Date   WBC 9.3 08/09/2013   HGB 10.9* 08/09/2013   HCT 33.3* 08/09/2013   MCV 89.8 08/09/2013   PLT 273 08/09/2013    Assessment / Plan: Induction of labor due to IUGR,  progressing well on pitocin  Labor: Will AROM now.  Preeclampsia:  NA Fetal Wellbeing:  Category I Pain Control:  Epidural I/D:  n/a Anticipated MOD:  NSVD  Tawnya CrookHogan, Trevaughn Schear Donovan 08/09/2013, 9:57 PM

## 2013-08-09 NOTE — H&P (Signed)
Attestation of Attending Supervision of Advanced Practitioner (CNM/NP): Evaluation and management procedures were performed by the Advanced Practitioner under my supervision and collaboration.  I have reviewed the Advanced Practitioner's note and chart, and I agree with the management and plan.  Shaylen Nephew 08/09/2013 2:22 PM

## 2013-08-09 NOTE — Progress Notes (Signed)
Patient ID: Maureen Perez, female   DOB: 06/03/1986, 28 y.o.   MRN: [id number] Maureen Perez is a 28 y.o. G3P1011 at 8768w0d admitted for IOL  Subjective: Comfortable. Aware of UCs  Objective: BP 121/58  Pulse 62  Temp(Src) 97.3 F (36.3 C) (Oral)  Resp 18  Ht 5\' 1"  (1.549 m)  Wt 71.668 kg (158 lb)  BMI 29.87 kg/m2  LMP 10/15/2012  Fetal Heart FHR: 125-130 bpm, variability: moderate,  accelerations:  Present,  decelerations:  Absent   Contractions: irreg, q 3-7  SVE:   Dilation: 1 (cytotec placed) Effacement (%): Thick Station: -3 Exam by:: Rashan Patient, CNM Cx post soft/ long> cytotec placed Assessment / Plan:   Labor: cx ripening Fetal Wellbeing: Cat 1 Pain Control:  n/a Expected mode of delivery: NSVD  Ether Goebel 08/09/2013, 1:47 PM

## 2013-08-10 ENCOUNTER — Encounter (HOSPITAL_COMMUNITY): Payer: Self-pay

## 2013-08-10 LAB — CBC
HCT: 31.8 % — ABNORMAL LOW (ref 36.0–46.0)
Hemoglobin: 10.5 g/dL — ABNORMAL LOW (ref 12.0–15.0)
MCH: 29.3 pg (ref 26.0–34.0)
MCHC: 33 g/dL (ref 30.0–36.0)
MCV: 88.8 fL (ref 78.0–100.0)
PLATELETS: 231 10*3/uL (ref 150–400)
RBC: 3.58 MIL/uL — ABNORMAL LOW (ref 3.87–5.11)
RDW: 14 % (ref 11.5–15.5)
WBC: 13.6 10*3/uL — AB (ref 4.0–10.5)

## 2013-08-10 LAB — CCBB MATERNAL DONOR DRAW

## 2013-08-10 MED ORDER — DIBUCAINE 1 % RE OINT: 1.0000 "application " | TOPICAL_OINTMENT | RECTAL | Status: DC | PRN

## 2013-08-10 MED ORDER — LANOLIN HYDROUS EX OINT: TOPICAL_OINTMENT | CUTANEOUS | Status: DC | PRN

## 2013-08-10 MED ORDER — HYDROCODONE-ACETAMINOPHEN 5-325 MG PO TABS: 1.0000 | ORAL_TABLET | Freq: Four times a day (QID) | ORAL | Status: DC | PRN

## 2013-08-10 MED ORDER — WITCH HAZEL-GLYCERIN EX PADS: 1.0000 "application " | MEDICATED_PAD | CUTANEOUS | Status: DC | PRN

## 2013-08-10 MED ORDER — ZOLPIDEM TARTRATE 5 MG PO TABS: 5.0000 mg | ORAL_TABLET | Freq: Every evening | ORAL | Status: DC | PRN

## 2013-08-10 MED ORDER — BENZOCAINE-MENTHOL 20-0.5 % EX AERO: 1.0000 "application " | INHALATION_SPRAY | CUTANEOUS | Status: DC | PRN

## 2013-08-10 MED ORDER — IBUPROFEN 600 MG PO TABS
600.0000 mg | ORAL_TABLET | Freq: Four times a day (QID) | ORAL | Status: DC
  Administered 2013-08-10 – 2013-08-11 (×6): 600 mg via ORAL
  Filled 2013-08-10 (×6): qty 1

## 2013-08-10 MED ORDER — PRENATAL MULTIVITAMIN CH
1.0000 | ORAL_TABLET | Freq: Every day | ORAL | Status: DC
  Administered 2013-08-10 – 2013-08-11 (×2): 1 via ORAL
  Filled 2013-08-10 (×2): qty 1

## 2013-08-10 MED ORDER — SENNOSIDES-DOCUSATE SODIUM 8.6-50 MG PO TABS
2.0000 | ORAL_TABLET | ORAL | Status: DC
  Administered 2013-08-11: 2 via ORAL
  Filled 2013-08-10: qty 2

## 2013-08-10 MED ORDER — METHYLERGONOVINE MALEATE 0.2 MG/ML IJ SOLN
0.2000 mg | Freq: Once | INTRAMUSCULAR | Status: AC
  Administered 2013-08-10: 0.2 mg via INTRAMUSCULAR

## 2013-08-10 MED ORDER — ONDANSETRON HCL 4 MG/2ML IJ SOLN: 4.0000 mg | INTRAMUSCULAR | Status: DC | PRN

## 2013-08-10 MED ORDER — SIMETHICONE 80 MG PO CHEW: 80.0000 mg | CHEWABLE_TABLET | ORAL | Status: DC | PRN

## 2013-08-10 MED ORDER — ONDANSETRON HCL 4 MG PO TABS
4.0000 mg | ORAL_TABLET | ORAL | Status: DC | PRN
  Administered 2013-08-10: 4 mg via ORAL
  Filled 2013-08-10: qty 1

## 2013-08-10 MED ORDER — TETANUS-DIPHTH-ACELL PERTUSSIS 5-2.5-18.5 LF-MCG/0.5 IM SUSP: 0.5000 mL | Freq: Once | INTRAMUSCULAR | Status: DC

## 2013-08-10 MED ORDER — DIPHENHYDRAMINE HCL 25 MG PO CAPS: 25.0000 mg | ORAL_CAPSULE | Freq: Four times a day (QID) | ORAL | Status: DC | PRN

## 2013-08-10 NOTE — Lactation Note (Signed)
This note was copied from the chart of Girl Florentina AddisonMelissa Tulloch. Lactation Consultation Note  Patient Name: Girl Florentina AddisonMelissa Sackmann ZOXWR'UToday's Date: 08/10/2013 Reason for consult: Initial assessment;Other (Comment) (charting for exclusion)   Maternal Data Formula Feeding for Exclusion: Yes Reason for exclusion: Mother's choice to formula feed on admision  Feeding Feeding Type: Formula Nipple Type: Regular  LATCH Score/Interventions                      Lactation Tools Discussed/Used     Consult Status Consult Status: Complete    Lynda RainwaterBryant, Chantal Worthey [place] 08/10/2013, 4:46 PM

## 2013-08-10 NOTE — Progress Notes (Signed)

## 2013-08-10 NOTE — Progress Notes (Signed)
I have seen the patient with the resident/student and agree with the above.  Hogan, Heather Donovan  

## 2013-08-10 NOTE — Progress Notes (Signed)
UR chart review completed.  

## 2013-08-10 NOTE — Progress Notes (Signed)
Called to bedside for continued bleeding.   This is the second evaluation, Small amount of BRB visible at the introitus with fundal massage. Now S/p pitocin bolus and 1000 mcg cytotec PR. Bladder emptied with about 300 mL out.   Does not appear to be true PPH.  Considering continued bleeding will give methergen and monitor closely. CBC in about 12 hours.   Murtis SinkSam Tila Millirons, MD [facility] Resident, PGY-2 08/10/2013, 2:09 AM

## 2013-08-10 NOTE — Progress Notes (Signed)
Post Partum Day 1 Subjective: Maureen Perez is a 28 y.o. [id number] who delivered at 6543w0d via SVD. She is doing well on PPD#1 with no issues or complaints. She reports minimal vaginal bleeding, +voiding, +flatus, +BM, and ambulates well. She denies N/V, HA, dizziness, changes in vision, SOB, or pain. She is currently bottle feeding and plans for Nexplanon for contraception.   Objective: Blood pressure 122/79, pulse 56, temperature 98.5 F (36.9 C), temperature source Oral, resp. rate 16, height 5\' 1"  (1.549 m), weight 71.668 kg (158 lb), last menstrual period 10/15/2012, SpO2 96.00%, unknown if currently breastfeeding.  Physical Exam:  General: alert, cooperative and no distress Lochia: appropriate Uterine Fundus: firm Incision: N/A DVT Evaluation: No evidence of DVT seen on physical exam. Negative Homan's sign. No significant calf/ankle edema.   Recent Labs  08/09/13 0740  HGB 10.9*  HCT 33.3*    Assessment/Plan: Plan for discharge tomorrow. She is currently bottle feeding and plans for Nexplanon.     LOS: 1 day   Jyll Tomaro, [place] VillageBennie-John, T PA-S 08/10/2013, 7:52 AM

## 2013-08-10 NOTE — Progress Notes (Signed)
Dr. Ermalinda MemosBradshaw notified at 0154 of patient's bleeding status. Patient continues to "trickle" when I check her bleeding. I have asked him to come and evaluate her himself and he agrees.

## 2013-08-10 NOTE — Progress Notes (Signed)
I have seen the patient with the resident/student and agree with the above.  Mellina Benison Donovan  

## 2013-08-10 NOTE — Anesthesia Postprocedure Evaluation (Signed)
Anesthesia Post Note  Patient: Maureen LipaMelissa D Falck  Procedure(s) Performed: * No procedures listed *  Anesthesia type: Epidural  Patient location: Mother/Baby  Post pain: Pain level controlled  Post assessment: Post-op Vital signs reviewed  Last Vitals:  Filed Vitals:   08/10/13 0758  BP: 126/80  Pulse: 52  Temp: 36.7 C  Resp: 18    Post vital signs: Reviewed  Level of consciousness:alert  Complications: No apparent anesthesia complications

## 2013-08-11 MED ORDER — MEASLES, MUMPS & RUBELLA VAC ~~LOC~~ INJ
0.5000 mL | INJECTION | Freq: Once | SUBCUTANEOUS | Status: AC
  Administered 2013-08-11: 0.5 mL via SUBCUTANEOUS
  Filled 2013-08-11: qty 0.5

## 2013-08-11 MED ORDER — IBUPROFEN 600 MG PO TABS: 600.0000 mg | ORAL_TABLET | Freq: Four times a day (QID) | ORAL | Status: DC

## 2013-08-11 NOTE — Discharge Instructions (Signed)

## 2013-08-11 NOTE — Discharge Summary (Signed)
Attestation of Attending Supervision of Fellow: Evaluation and management procedures were performed by the Fellow under my supervision and collaboration.  I have reviewed the Fellow's note and chart, and I agree with the management and plan.    

## 2013-08-11 NOTE — Discharge Summary (Signed)
Obstetric Discharge Summary Reason for Admission: induction of labor Prenatal Procedures: NST Intrapartum Procedures: spontaneous vaginal delivery Postpartum Procedures: none Complications-Operative and Postpartum: none Hemoglobin  Date Value Ref Range Status  08/10/2013 10.5* 12.0 - 15.0 g/dL Final     HCT  Date Value Ref Range Status  08/10/2013 31.8* 36.0 - 46.0 % Final   Delivery Note  At 11:37 PM a healthy female was delivered via (Presentation:LOA). APGAR: 8 ,9 ; weight: to weighed.  Placenta status: spontaneous. Cord: 3 vessel with the following complications: . Cord pH: N/A  Anesthesia: Epidural  Episiotomy: n/a  Lacerations: none  Suture Repair: n/a  Est. Blood Loss (mL): 300 mL  Mom to postpartum. Baby to Couplet care / Skin to Skin.  A healthy baby girl was delivered quickly in 3 pushes over an intact perineum. The cord was calmped and cut, cord blood was collected for donation. The third stage of labor was managed actively with traction and pitocin. She had a boggy uterus with continued trickling bleeding so she was given 1000 mcg cytotec PR with good uterine response.  Kevin FentonBradshaw, Samuel  08/10/2013, 12:01 AM    Physical Exam:  General: alert, cooperative and no distress Lochia: appropriate Uterine Fundus: firm Incision: N/A DVT Evaluation: No evidence of DVT seen on physical exam. Negative Homan's sign. No significant calf/ankle edema.   Hospital Course: Maureen Perez is a 28 y.o. [id number] who presented for IOL for SGA at 8182w0d. She progressed well and delivered a viable female via SVD. Postpartum care was uncomplicated and she is being discharged in stable condition PPD#2. SW consult for h/o depression/anxiety. She is currently bottle feeding and plans for Nexplanon for contraception.     Discharge Diagnoses: Term Pregnancy-delivered  Discharge Information: Date: 08/11/2013 Activity: pelvic rest Diet: routine Medications: PNV, Ibuprofen and  Iron Condition: stable Instructions: refer to practice specific booklet Discharge to: home  Follow-up Information   [facility]. Schedule an appointment as soon as possible for a visit in 4 weeks. (Postpartum follow-up)    Specialty:  Obstetrics and Gynecology   Contact information:   [address] [place] [id number] [phone]       Newborn Data: Live born female  Birth Weight: 5 lb 9.6 oz (2540 g) APGAR: 8, 9  Home with mother.  [place], [place]oaBennie-John, T 08/11/2013, 7:26 AM  I have seen and examined this patient and agree with above documentation in the PA student's note. She is planning for nexplanon for contraception. She is bottle feeding.   Rulon AbideKeli Jahad Old, [name] [facility] 08/11/2013 10:44 AM

## 2013-08-16 ENCOUNTER — Emergency Department (HOSPITAL_COMMUNITY): Payer: Medicaid Other

## 2013-08-16 ENCOUNTER — Encounter (HOSPITAL_COMMUNITY): Payer: Self-pay | Admitting: Emergency Medicine

## 2013-08-16 DIAGNOSIS — Z87891 Personal history of nicotine dependence: Secondary | ICD-10-CM | POA: Insufficient documentation

## 2013-08-16 DIAGNOSIS — O99215 Obesity complicating the puerperium: Secondary | ICD-10-CM

## 2013-08-16 DIAGNOSIS — R110 Nausea: Secondary | ICD-10-CM | POA: Insufficient documentation

## 2013-08-16 DIAGNOSIS — Z3202 Encounter for pregnancy test, result negative: Secondary | ICD-10-CM | POA: Insufficient documentation

## 2013-08-16 DIAGNOSIS — Z8659 Personal history of other mental and behavioral disorders: Secondary | ICD-10-CM | POA: Insufficient documentation

## 2013-08-16 DIAGNOSIS — R0602 Shortness of breath: Secondary | ICD-10-CM | POA: Insufficient documentation

## 2013-08-16 DIAGNOSIS — Z87828 Personal history of other (healed) physical injury and trauma: Secondary | ICD-10-CM | POA: Insufficient documentation

## 2013-08-16 DIAGNOSIS — O26899 Other specified pregnancy related conditions, unspecified trimester: Secondary | ICD-10-CM | POA: Insufficient documentation

## 2013-08-16 DIAGNOSIS — Z79899 Other long term (current) drug therapy: Secondary | ICD-10-CM | POA: Insufficient documentation

## 2013-08-16 DIAGNOSIS — R609 Edema, unspecified: Secondary | ICD-10-CM | POA: Insufficient documentation

## 2013-08-16 DIAGNOSIS — O909 Complication of the puerperium, unspecified: Secondary | ICD-10-CM

## 2013-08-16 DIAGNOSIS — O903 Peripartum cardiomyopathy: Secondary | ICD-10-CM | POA: Insufficient documentation

## 2013-08-16 DIAGNOSIS — E669 Obesity, unspecified: Secondary | ICD-10-CM | POA: Insufficient documentation

## 2013-08-16 DIAGNOSIS — M7989 Other specified soft tissue disorders: Secondary | ICD-10-CM | POA: Insufficient documentation

## 2013-08-16 LAB — BASIC METABOLIC PANEL
BUN: 9 mg/dL (ref 6–23)
CALCIUM: 9 mg/dL (ref 8.4–10.5)
CO2: 26 mEq/L (ref 19–32)
Chloride: 105 mEq/L (ref 96–112)
Creatinine, Ser: 0.77 mg/dL (ref 0.50–1.10)
Glucose, Bld: 91 mg/dL (ref 70–99)
Potassium: 4 mEq/L (ref 3.7–5.3)
SODIUM: 144 meq/L (ref 137–147)

## 2013-08-16 LAB — CBC
HCT: 33.9 % — ABNORMAL LOW (ref 36.0–46.0)
Hemoglobin: 11.2 g/dL — ABNORMAL LOW (ref 12.0–15.0)
MCH: 29.9 pg (ref 26.0–34.0)
MCHC: 33 g/dL (ref 30.0–36.0)
MCV: 90.6 fL (ref 78.0–100.0)
PLATELETS: 281 10*3/uL (ref 150–400)
RBC: 3.74 MIL/uL — ABNORMAL LOW (ref 3.87–5.11)
RDW: 13.5 % (ref 11.5–15.5)
WBC: 9.7 10*3/uL (ref 4.0–10.5)

## 2013-08-16 LAB — I-STAT TROPONIN, ED: TROPONIN I, POC: 0 ng/mL (ref 0.00–0.08)

## 2013-08-16 LAB — POC URINE PREG, ED: Preg Test, Ur: NEGATIVE

## 2013-08-16 NOTE — ED Notes (Signed)
Pt. reports mid chest pain onset 1 week ago with slight SOB and nausea.

## 2013-08-16 NOTE — ED Notes (Signed)
Patient transported to X-ray, unable to void for UA sample at this time.

## 2013-08-17 ENCOUNTER — Emergency Department (HOSPITAL_COMMUNITY)
Admission: EM | Admit: 2013-08-17 | Discharge: 2013-08-17 | Disposition: A | Discharge status: Home / Self Care | Payer: Medicaid Other | Attending: Emergency Medicine | Admitting: Emergency Medicine

## 2013-08-17 ENCOUNTER — Emergency Department (HOSPITAL_COMMUNITY): Payer: Medicaid Other

## 2013-08-17 ENCOUNTER — Encounter (HOSPITAL_COMMUNITY): Payer: Self-pay | Admitting: Radiology

## 2013-08-17 DIAGNOSIS — O903 Peripartum cardiomyopathy: Secondary | ICD-10-CM

## 2013-08-17 LAB — PRO B NATRIURETIC PEPTIDE: PRO B NATRI PEPTIDE: 365.6 pg/mL — AB (ref 0–125)

## 2013-08-17 MED ORDER — POTASSIUM CHLORIDE ER 10 MEQ PO TBCR: 10.0000 meq | EXTENDED_RELEASE_TABLET | Freq: Every day | ORAL | Status: DC

## 2013-08-17 MED ORDER — IOHEXOL 350 MG/ML SOLN
100.0000 mL | Freq: Once | INTRAVENOUS | Status: AC | PRN
  Administered 2013-08-17: 100 mL via INTRAVENOUS

## 2013-08-17 MED ORDER — FUROSEMIDE 20 MG PO TABS: 20.0000 mg | ORAL_TABLET | Freq: Every day | ORAL | Status: DC

## 2013-08-17 NOTE — ED Notes (Signed)
Patient back from CT.

## 2013-08-17 NOTE — Discharge Instructions (Signed)
Call [facility]  to arrange a followup appointment as you will need an echocardiogram. The contact information is provided to you in this discharge summary.   Return to the ER if you develop severe chest pain or difficulty breathing.    Cardiomyopathy Cardiomyopathy means a disease of the heart muscle. The heart muscle becomes enlarged or stiff. The heart is not able to pump enough blood or deliver enough oxygen to the body. This leads to heart failure and is the number one reason for heart transplants.  TYPES OF CARDIOMYOPATHY INCLUDE: DILATED  The most common type. The heart muscle is stretched out and weak so there is less blood pumped out.   Some causes:  Disease of the arteries of the heart (ischemia).  Heart attack with muscle scar.  Leaky or damaged valves.  After a viral illness.  Smoking.  High cholesterol.  Diabetes or overactive thyroid.  Alcohol or drug abuse.  High blood pressure.  May be reversible. HYPERTROPHIC The heart muscle grows bigger so there is less room for blood in the ventricle, and not enough blood is pumped out.   Causes include:  Mitral valve leaks.  Inherited tendency (from your family).  No explanation (idiopathic).  May be a cause of sudden death in young athletes with no symptoms. RESTRICTIVE The heart muscle becomes stiff, but not always larger. The heart has to work harder and will get weaker. Abnormal heart beats or rhythm (arrhythmia) are common.  Some causes:  Diseases in other parts of the body which may produce abnormal deposits in the heart muscle.  Probably not inherited.  A result of radiation treatment for cancer. SYMPTOMS OF ALL TYPES:  Less able to exercise or tolerate physical activity.  Palpitations.  Irregular heart beat, heart arrhythmias.  Shortness of breath, even at rest.  Chest pain.  Lightheadedness or fainting. TREATMENT  Life-style changes including reducing salt, lowering cholesterol, stop  smoking.  Manage contributing causes with medications.  Medicines to help reduce the fluids in the body.  An implanted cardioverter defibrillator (ICD) to improve heart function and correct arrhythmias.  Medications to relax the blood vessels and make it easier for the heart to pump.  Drugs that help regulate heart beat and improve heart relaxation, reducing the work of the heart.  Myomectomy for patients with hypertrophic cardiomyopathy and severe problems. This is a surgical procedure that removes a portion of the thickened muscle wall in order to improve heart output and provide symptom relief.  A heart transplant is an option in carefully applied circumstances. SEEK IMMEDIATE MEDICAL CARE IF:   You have severe chest pain, especially if the pain is crushing or pressure-like and spreads to the arms, back, neck, or jaw, or if you have sweating, feeling sick to your stomach (nausea), or shortness of breath. THIS IS AN EMERGENCY. Do not wait to see if the pain will go away. Get medical help at once. Call your local emergency services (911 in [place] DO NOT drive yourself to the hospital.  You develop severe shortness of breath.  You begin to cough up bloody sputum.  You are unable to sleep because you cannot breathe.  You gain weight due to fluid retention.  You develop painful swelling in your calf or leg.  You feel your heart racing and it does not go away or happens when you are resting. Document Released: 08/16/2004 Document Revised: 08/26/2011 Document Reviewed: 01/20/2008 [facility] Patient Information 2014 [employer] [place].

## 2013-08-17 NOTE — ED Notes (Signed)
Patient states she has been having centralized chest pain for the last day or so.  States she has had SOB.  2+ pitting edema to the lower ext.

## 2013-08-17 NOTE — ED Provider Notes (Signed)
CSN: [id number]     Arrival date & time 08/16/13  2228 History   First MD Initiated Contact with Patient 08/17/13 0136     Chief Complaint  Patient presents with  . Chest Pain     (Consider location/radiation/quality/duration/timing/severity/associated sxs/prior Treatment) HPI Comments: Patient is a 28 year old female who is one week postpartum. She states at the time of delivery she developed discomfort in her chest which has persisted since that time. She also feels short of breath, nauseated and reports swelling of both legs which she did not have through her entire pregnancy. She denies any fevers or chills. She denies having had this happen before.  Patient is a 28 y.o. female presenting with chest pain. The history is provided by the patient.  Chest Pain Pain location:  Substernal area Pain quality: tightness   Pain radiates to:  Does not radiate Pain radiates to the back: no   Pain severity:  Moderate Onset quality:  Sudden Duration:  1 week Timing:  Constant Progression:  Unchanged Chronicity:  New Relieved by:  Nothing Worsened by:  Nothing tried Ineffective treatments:  None tried   Past Medical History  Diagnosis Date  . Back pain   . Obesity   . Depression     no meds since pregancny  . Abnormal Pap smear     repeat WNL  . PONV (postoperative nausea and vomiting)   . Injury of tendon of left rotator cuff    Past Surgical History  Procedure Laterality Date  . Tubes in ears  age 89  . Tonsillectomy      age 28   Family History  Problem Relation Age of Onset  . Depression Mother   . Diabetes Mother   . Hypertension Mother   . COPD Mother   . Anesthesia problems Mother   . Depression Sister   . Diabetes Sister   . Kidney disease Sister   . Hypertension Sister   . Hypertension Father   . Anesthesia problems Father    History  Substance Use Topics  . Smoking status: Former Smoker -- 0.25 packs/day for 6 years    Types: Cigarettes  . Smokeless  tobacco: Never Used  . Alcohol Use: No   OB History   Grav Para Term Preterm Abortions TAB SAB Ect Mult Living   3 2 2  0 1 0 1 0 0 2     Review of Systems  Cardiovascular: Positive for chest pain.  All other systems reviewed and are negative.      Allergies  Cephalexin and Percocet  Home Medications   Current Outpatient Rx  Name  Route  Sig  Dispense  Refill  . ibuprofen (ADVIL,MOTRIN) 600 MG tablet   Oral   Take 1 tablet (600 mg total) by mouth every 6 (six) hours.   30 tablet   0   . nicotine (NICODERM CQ - DOSED IN MG/24 HOURS) 21 mg/24hr patch   Transdermal   Place 21 mg onto the skin daily.         . Prenatal Vit-Fe Fumarate-FA (PRENATAL MULTIVITAMIN) TABS tablet   Oral   Take 1 tablet by mouth daily at 12 noon.          BP 171/94  Pulse 48  Temp(Src) 98.4 F (36.9 C) (Oral)  Resp 14  Ht 5\' 1"  (1.549 m)  Wt 159 lb (72.122 kg)  BMI 30.06 kg/m2  SpO2 99%  LMP 10/15/2012 Physical Exam  Nursing note and vitals reviewed. Constitutional:  She is oriented to person, place, and time. She appears well-developed and well-nourished. No distress.  HENT:  Head: Normocephalic and atraumatic.  Neck: Normal range of motion. Neck supple.  Cardiovascular: Normal rate and regular rhythm.  Exam reveals no gallop and no friction rub.   No murmur heard. Pulmonary/Chest: Effort normal and breath sounds normal. No respiratory distress. She has no wheezes. She has no rales. She exhibits tenderness.  Abdominal: Soft. Bowel sounds are normal. She exhibits no distension. There is no tenderness.  Musculoskeletal: Normal range of motion. She exhibits edema.  There is trace to 1+ pitting edema in the bilateral lower extremities.  Neurological: She is alert and oriented to person, place, and time.  Skin: Skin is warm and dry. She is not diaphoretic.    ED Course  Procedures (including critical care time) Labs Review Labs Reviewed  CBC - Abnormal; Notable for the following:     RBC 3.74 (*)    Hemoglobin 11.2 (*)    HCT 33.9 (*)    All other components within normal limits  PRO B NATRIURETIC PEPTIDE - Abnormal; Notable for the following:    Pro B Natriuretic peptide (BNP) 365.6 (*)    All other components within normal limits  BASIC METABOLIC PANEL  I-STAT TROPOININ, ED  POC URINE PREG, ED   Imaging Review Dg Chest 2 View  08/16/2013   CLINICAL DATA:  Chest pain  EXAM: CHEST  2 VIEW  COMPARISON:  None.  FINDINGS: Normal heart size and mediastinal contours. No acute infiltrate or edema. No effusion or pneumothorax. No acute osseous findings.  IMPRESSION: No active cardiopulmonary disease.   Electronically Signed   By: Tiburcio Pea [name]   On: 08/16/2013 23:53     EKG Interpretation   Date/Time:  Monday August 16 2013 22:37:11 EST Ventricular Rate:  40 PR Interval:  130 QRS Duration: 84 QT Interval:  496 QTC Calculation: 404 R Axis:   58 Text Interpretation:  Marked sinus bradycardia Abnormal ECG Confirmed by  DELOS  MD, Delcenia Inman ([id number]) on 08/17/2013 1:50:09 AM      MDM   Final diagnoses:  None    Patient is a 27 year old female who was one week postpartum. She states she developed chest discomfort around the time of delivery and is now having shortness of breath and swelling of her legs. Workup reveals a mildly elevated BNP. CT scan of the chest is negative for PE, however does reveal evidence for a small pericardial effusion and possibly cardiomegaly. 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 I discussed this case with Dr. Shirlee Latch from cardiology who recommends Lasix and potassium and followup at the cardiology clinic for an echocardiogram and further workup. She does not appear in any distress. There is no hypoxia and no indication for admission.    Geoffery Lyons, MD 08/17/13 [phone]

## 2013-08-18 ENCOUNTER — Encounter: Payer: Self-pay | Admitting: Cardiovascular Disease

## 2013-08-18 ENCOUNTER — Ambulatory Visit (INDEPENDENT_AMBULATORY_CARE_PROVIDER_SITE_OTHER): Payer: Medicaid Other | Admitting: Cardiovascular Disease

## 2013-08-18 VITALS — BP 124/80 | HR 53 | Ht 61.0 in | Wt 155.0 lb

## 2013-08-18 DIAGNOSIS — I319 Disease of pericardium, unspecified: Secondary | ICD-10-CM

## 2013-08-18 DIAGNOSIS — I3139 Other pericardial effusion (noninflammatory): Secondary | ICD-10-CM

## 2013-08-18 DIAGNOSIS — I517 Cardiomegaly: Secondary | ICD-10-CM

## 2013-08-18 DIAGNOSIS — I313 Pericardial effusion (noninflammatory): Secondary | ICD-10-CM

## 2013-08-18 DIAGNOSIS — R079 Chest pain, unspecified: Secondary | ICD-10-CM

## 2013-08-18 NOTE — Patient Instructions (Signed)
Your physician has requested that you have an echocardiogram. Echocardiography is a painless test that uses sound waves to create images of your heart. It provides your doctor with information about the size and shape of your heart and how well your heart's chambers and valves are working. This procedure takes approximately one hour. There are no restrictions for this procedure.  Your physician recommends that you schedule a follow-up appointment in: AS NEEDED BASIS  Your physician has recommended you make the following change in your medication:  STOP LASIX STOP POTASSIUM CHLORIDE

## 2013-08-18 NOTE — Assessment & Plan Note (Signed)
Her chest pain is clearly chest wall pain.  She is very tender above her right breast.  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 There is nothing about her physical exam that is consistent with pericardial cardiopathy.  She is very comfortable and does not have any shortness of breath. There is no significant edema. She does not have an S3 gallop. There is no evidence of volume overload. She may have some very mild swelling that may be related to her recent pregnancy.  She had a CT angiogram of her chest to rule out pulmonary embolus. She found to  have mild cardiomegaly and a very tiny pericardial effusion.  We'll get an echocardiogram for further assessment of her left ventricular function. I would be  very surprised if she has any evidence of peripartum cardiomyopathy.   I've advised her to discontinue the Lasix and potassium that she was prescribed yesterday.  She should stop smoking.   We'll see her on an as-needed basis. We'll have her followup with her general medical Dr.

## 2013-08-18 NOTE — Progress Notes (Signed)
Maureen Perez Date of Birth  04/17/1986       [facility]    [place] [facility] [address]  [address], [address] [place]  [address]   [place]  [id number] [phone]    [phone]   Fax  [phone]     Fax [phone]  Problem List: 1. Chest pain   History of Present Illness:  Maureen Perez presents for evaluation of some chest pain.  This started during the delivery of her baby. ( Feb. 23, 2015)  The pain has been constant since that time.  She has been seen in the ER.  She walks freqently - actually helps the pain.  No dyspnea. Mild leg edema.   She was seen in the ER recently and was started on Lasix and Kdur for possible peripartum cardiomyopathy.  She has not started these medications.    Current Outpatient Prescriptions on File Prior to Visit  Medication Sig Dispense Refill  . furosemide (LASIX) 20 MG tablet Take 1 tablet (20 mg total) by mouth daily.  10 tablet  0  . ibuprofen (ADVIL,MOTRIN) 600 MG tablet Take 1 tablet (600 mg total) by mouth every 6 (six) hours.  30 tablet  0  . nicotine (NICODERM CQ - DOSED IN MG/24 HOURS) 21 mg/24hr patch Place 21 mg onto the skin daily.      . potassium chloride (K-DUR) 10 MEQ tablet Take 1 tablet (10 mEq total) by mouth daily.  10 tablet  0  . Prenatal Vit-Fe Fumarate-FA (PRENATAL MULTIVITAMIN) TABS tablet Take 1 tablet by mouth daily at 12 noon.       No current facility-administered medications on file prior to visit.    Allergies  Allergen Reactions  . Cephalexin Itching and Nausea And Vomiting  . Percocet [Oxycodone-Acetaminophen] Nausea And Vomiting    Past Medical History  Diagnosis Date  . Back pain   . Obesity   . Depression     no meds since pregancny  . Abnormal Pap smear     repeat WNL  . PONV (postoperative nausea and vomiting)   . Injury of tendon of left rotator cuff     Past Surgical History  Procedure Laterality Date  . Tubes in ears  age 89  .  Tonsillectomy      age 28    History  Smoking status  . Former Smoker -- 0.25 packs/day for 6 years  . Types: Cigarettes  Smokeless tobacco  . Never Used    History  Alcohol Use No    Family History  Problem Relation Age of Onset  . Depression Mother   . Diabetes Mother   . Hypertension Mother   . COPD Mother   . Anesthesia problems Mother   . Depression Sister   . Diabetes Sister   . Kidney disease Sister   . Hypertension Sister   . Hypertension Father   . Anesthesia problems Father     Reviw of Systems:  Reviewed in the HPI.  All other systems are negative.  Physical Exam: Blood pressure 124/80, pulse 53, height 5\' 1"  (1.549 m), weight 155 lb (70.308 kg), last menstrual period 10/15/2012, unknown if currently breastfeeding. Wt Readings from Last 3 Encounters:  08/18/13 155 lb (70.308 kg)  08/16/13 159 lb (72.122 kg)  08/09/13 158 lb (71.668 kg)     General: Well developed, well nourished, in no acute distress. Head: Normocephalic, atraumatic, sclera non-icteric, mucus membranes are moist, poor  dentition  Neck: Supple. Carotids are 2 + without bruits. No JVD  Lungs: Clear  Heart: RR, normal  S1, S2,no gallop.  No S3 .  She has right chest wall tenderness. Abdomen: Soft, non-tender, non-distended with normal bowel sounds. Msk:  Strength and tone are normal  Extremities: No clubbing or cyanosis. No edema.  Distal pedal pulses are 2+ and equal  Neuro: CN II - XII intact.  Alert and oriented X 3.  Psych:  Normal   ECG: August 18, 2013:  Sinus brady at 41.  No ST or T wave changes.   Assessment / Plan:

## 2013-09-03 ENCOUNTER — Ambulatory Visit (HOSPITAL_COMMUNITY): Payer: Medicaid Other | Attending: Cardiovascular Disease | Admitting: Radiology

## 2013-09-03 DIAGNOSIS — R9430 Abnormal result of cardiovascular function study, unspecified: Secondary | ICD-10-CM

## 2013-09-03 DIAGNOSIS — I313 Pericardial effusion (noninflammatory): Secondary | ICD-10-CM

## 2013-09-03 DIAGNOSIS — I517 Cardiomegaly: Secondary | ICD-10-CM

## 2013-09-03 DIAGNOSIS — I319 Disease of pericardium, unspecified: Secondary | ICD-10-CM | POA: Insufficient documentation

## 2013-09-03 DIAGNOSIS — I3139 Other pericardial effusion (noninflammatory): Secondary | ICD-10-CM

## 2013-09-03 NOTE — Progress Notes (Signed)
Echocardiogram performed.  

## 2013-09-06 ENCOUNTER — Telehealth: Payer: Self-pay | Admitting: Cardiovascular Disease

## 2013-09-06 NOTE — Telephone Encounter (Signed)
New Message: ° °Pt is calling to hear her recent test results °

## 2013-09-06 NOTE — Telephone Encounter (Signed)
Pt called with echo result.

## 2013-09-22 ENCOUNTER — Ambulatory Visit: Payer: Medicaid Other | Admitting: Obstetrics & Gynecology

## 2013-09-29 ENCOUNTER — Ambulatory Visit: Payer: Medicaid Other | Admitting: Obstetrics & Gynecology

## 2013-10-13 ENCOUNTER — Ambulatory Visit: Payer: Medicaid Other | Admitting: Family Medicine

## 2013-10-19 ENCOUNTER — Ambulatory Visit: Payer: Medicaid Other | Admitting: Family Medicine

## 2013-11-14 ENCOUNTER — Emergency Department (INDEPENDENT_AMBULATORY_CARE_PROVIDER_SITE_OTHER)
Admission: EM | Admit: 2013-11-14 | Discharge: 2013-11-14 | Disposition: A | Discharge status: Home / Self Care | Payer: Medicaid Other | Source: Home / Self Care | Attending: Family Medicine | Admitting: Family Medicine

## 2013-11-14 ENCOUNTER — Emergency Department (INDEPENDENT_AMBULATORY_CARE_PROVIDER_SITE_OTHER): Payer: Medicaid Other

## 2013-11-14 ENCOUNTER — Encounter (HOSPITAL_COMMUNITY): Payer: Self-pay | Admitting: Emergency Medicine

## 2013-11-14 DIAGNOSIS — X58XXXA Exposure to other specified factors, initial encounter: Secondary | ICD-10-CM

## 2013-11-14 DIAGNOSIS — S93609A Unspecified sprain of unspecified foot, initial encounter: Secondary | ICD-10-CM

## 2013-11-14 MED ORDER — DICLOFENAC SODIUM 50 MG PO TBEC: 50.0000 mg | DELAYED_RELEASE_TABLET | Freq: Two times a day (BID) | ORAL | Status: DC | PRN

## 2013-11-14 NOTE — ED Notes (Signed)
C/o left ankle pain due to falling down stairs yesterday States leg was behind her when she landed Did elevate the foot as tx

## 2013-11-14 NOTE — Discharge Instructions (Signed)
Thank you for coming in today.  Foot Sprain The muscles and cord like structures which attach muscle to bone (tendons) that surround the feet are made up of units. A foot sprain can occur at the weakest spot in any of these units. This condition is most often caused by injury to or overuse of the foot, as from playing contact sports, or aggravating a previous injury, or from poor conditioning, or obesity. SYMPTOMS  Pain with movement of the foot.  Tenderness and swelling at the injury site.  Loss of strength is present in moderate or severe sprains. THE THREE GRADES OR SEVERITY OF FOOT SPRAIN ARE:  Mild (Grade I): Slightly pulled muscle without tearing of muscle or tendon fibers or loss of strength.  Moderate (Grade II): Tearing of fibers in a muscle, tendon, or at the attachment to bone, with small decrease in strength.  Severe (Grade III): Rupture of the muscle-tendon-bone attachment, with separation of fibers. Severe sprain requires surgical repair. Often repeating (chronic) sprains are caused by overuse. Sudden (acute) sprains are caused by direct injury or over-use. DIAGNOSIS  Diagnosis of this condition is usually by your own observation. If problems continue, a caregiver may be required for further evaluation and treatment. X-rays may be required to make sure there are not breaks in the bones (fractures) present. Continued problems may require physical therapy for treatment. PREVENTION  Use strength and conditioning exercises appropriate for your sport.  Warm up properly prior to working out.  Use athletic shoes that are made for the sport you are participating in.  Allow adequate time for healing. Early return to activities makes repeat injury more likely, and can lead to an unstable arthritic foot that can result in prolonged disability. Mild sprains generally heal in 3 to 10 days, with moderate and severe sprains taking 2 to 10 weeks. Your caregiver can help you determine the  proper time required for healing. HOME CARE INSTRUCTIONS   Apply ice to the injury for 15-20 minutes, 03-04 times per day. Put the ice in a plastic bag and place a towel between the bag of ice and your skin.  An elastic wrap (like an Ace bandage) may be used to keep swelling down.  Keep foot above the level of the heart, or at least raised on a footstool, when swelling and pain are present.  Try to avoid use other than gentle range of motion while the foot is painful. Do not resume use until instructed by your caregiver. Then begin use gradually, not increasing use to the point of pain. If pain does develop, decrease use and continue the above measures, gradually increasing activities that do not cause discomfort, until you gradually achieve normal use.  Use crutches if and as instructed, and for the length of time instructed.  Keep injured foot and ankle wrapped between treatments.  Massage foot and ankle for comfort and to keep swelling down. Massage from the toes up towards the knee.  Only take over-the-counter or prescription medicines for pain, discomfort, or fever as directed by your caregiver. SEEK IMMEDIATE MEDICAL CARE IF:   Your pain and swelling increase, or pain is not controlled with medications.  You have loss of feeling in your foot or your foot turns cold or blue.  You develop new, unexplained symptoms, or an increase of the symptoms that brought you to your caregiver. MAKE SURE YOU:   Understand these instructions.  Will watch your condition.  Will get help right away if you are not  doing well or get worse. Document Released: 11/23/2001 Document Revised: 08/26/2011 Document Reviewed: 01/21/2008 [facility] Patient Information 2014 [place] [place].     TREATMENT Treatment initially involves the use of ice, medication, and compression bandages to help reduce pain and inflammation. Ankle sprains are usually immobilized in a walking cast or boot to allow for healing.  Crutches may be recommended to reduce pressure on the injury. After immobilization, strengthening and stretching exercises may be necessary to regain strength and a full range of motion. Surgery is rarely needed to treat ankle sprains. MEDICATION   Nonsteroidal anti-inflammatory medications, such as aspirin and ibuprofen (do not take for the first 3 days after injury or within 7 days before surgery), or other minor pain relievers, such as acetaminophen, are often recommended. Take these as directed by your caregiver. Contact your caregiver immediately if any bleeding, stomach upset, or signs of an allergic reaction occur from these medications.  Ointments applied to the skin may be helpful.  Pain relievers may be prescribed as necessary by your caregiver. Do not take prescription pain medication for longer than 4 to 7 days. Use only as directed and only as much as you need. HEAT AND COLD  Cold treatment (icing) is used to relieve pain and reduce inflammation for acute and chronic cases. Cold should be applied for 10 to 15 minutes every 2 to 3 hours for inflammation and pain and immediately after any activity that aggravates your symptoms. Use ice packs or an ice massage.  Heat treatment may be used before performing stretching and strengthening activities prescribed by your caregiver. Use a heat pack or a warm soak. SEEK IMMEDIATE MEDICAL CARE IF:   Pain, swelling, or bruising worsens despite treatment.  You experience pain, numbness, discoloration, or coldness in the foot or toes.  New, unexplained symptoms develop (drugs used in treatment may produce side effects.) EXERCISES  PHASE I EXERCISES RANGE OF MOTION (ROM) AND STRETCHING EXERCISES - Ankle Sprain, Acute Phase I, Weeks 1 to 2 These exercises may help you when beginning to restore flexibility in your ankle. You will likely work on these exercises for the 1 to 2 weeks after your injury. Once your physician, physical therapist, or  athletic trainer sees adequate progress, he or she will advance your exercises. While completing these exercises, remember:   Restoring tissue flexibility helps normal motion to return to the joints. This allows healthier, less painful movement and activity.  An effective stretch should be held for at least 30 seconds.  A stretch should never be painful. You should only feel a gentle lengthening or release in the stretched tissue. RANGE OF MOTION - Dorsi/Plantar Flexion  While sitting with your right / left knee straight, draw the top of your foot upwards by flexing your ankle. Then reverse the motion, pointing your toes downward.  Hold each position for __________ seconds.  After completing your first set of exercises, repeat this exercise with your knee bent. Repeat __________ times. Complete this exercise __________ times per day.  RANGE OF MOTION - Ankle Alphabet  Imagine your right / left big toe is a pen.  Keeping your hip and knee still, write out the entire alphabet with your "pen." Make the letters as large as you can without increasing any discomfort. Repeat __________ times. Complete this exercise __________ times per day.  STRENGTHENING EXERCISES - Ankle Sprain, Acute -Phase I, Weeks 1 to 2 These exercises may help you when beginning to restore strength in your ankle. You will likely work  on these exercises for 1 to 2 weeks after your injury. Once your physician, physical therapist, or athletic trainer sees adequate progress, he or she will advance your exercises. While completing these exercises, remember:   Muscles can gain both the endurance and the strength needed for everyday activities through controlled exercises.  Complete these exercises as instructed by your physician, physical therapist, or athletic trainer. Progress the resistance and repetitions only as guided.  You may experience muscle soreness or fatigue, but the pain or discomfort you are trying to eliminate  should never worsen during these exercises. If this pain does worsen, stop and make certain you are following the directions exactly. If the pain is still present after adjustments, discontinue the exercise until you can discuss the trouble with your clinician. STRENGTH - Dorsiflexors  Secure a rubber exercise band/tubing to a fixed object (ie. table, pole) and loop the other end around your right / left foot.  Sit on the floor facing the fixed object. The band/tubing should be slightly tense when your foot is relaxed.  Slowly draw your foot back toward you using your ankle and toes.  Hold this position for __________ seconds. Slowly release the tension in the band and return your foot to the starting position. Repeat __________ times. Complete this exercise __________ times per day.  STRENGTH - Plantar-flexors   Sit with your right / left leg extended. Holding onto both ends of a rubber exercise band/tubing, loop it around the ball of your foot. Keep a slight tension in the band.  Slowly push your toes away from you, pointing them downward.  Hold this position for __________ seconds. Return slowly, controlling the tension in the band/tubing. Repeat __________ times. Complete this exercise __________ times per day.  STRENGTH - Ankle Eversion  Secure one end of a rubber exercise band/tubing to a fixed object (table, pole). Loop the other end around your foot just before your toes.  Place your fists between your knees. This will focus your strengthening at your ankle.  Drawing the band/tubing across your opposite foot, slowly, pull your little toe out and up. Make sure the band/tubing is positioned to resist the entire motion.  Hold this position for __________ seconds. Have your muscles resist the band/tubing as it slowly pulls your foot back to the starting position.  Repeat __________ times. Complete this exercise __________ times per day.  STRENGTH - Ankle Inversion  Secure one end  of a rubber exercise band/tubing to a fixed object (table, pole). Loop the other end around your foot just before your toes.  Place your fists between your knees. This will focus your strengthening at your ankle.  Slowly, pull your big toe up and in, making sure the band/tubing is positioned to resist the entire motion.  Hold this position for __________ seconds.  Have your muscles resist the band/tubing as it slowly pulls your foot back to the starting position. Repeat __________ times. Complete this exercises __________ times per day.  STRENGTH - Towel Curls  Sit in a chair positioned on a non-carpeted surface.  Place your right / left foot on a towel, keeping your heel on the floor.  Pull the towel toward your heel by only curling your toes. Keep your heel on the floor.  If instructed by your physician, physical therapist, or athletic trainer, add weight to the end of the towel. Repeat __________ times. Complete this exercise __________ times per day. Document Released: 01/02/2005 Document Revised: 08/26/2011 Document Reviewed: 09/15/2008 [employer] Patient Information  2014 [employer]. ° °

## 2013-11-14 NOTE — ED Provider Notes (Signed)
Maureen Perez is a 28 y.o. female who presents to Urgent Care today for left lateral foot pain occurring yesterday afternoon inversion injury. Pain at the proximal fifth metatarsal. Pain worse with activity better with rest. No radiating pain weakness or numbness. Medications tried yet.   Past Medical History  Diagnosis Date  . Back pain   . Obesity   . Depression     no meds since pregancny  . Abnormal Pap smear     repeat WNL  . PONV (postoperative nausea and vomiting)   . Injury of tendon of left rotator cuff    History  Substance Use Topics  . Smoking status: Former Smoker -- 0.25 packs/day for 6 years    Types: Cigarettes  . Smokeless tobacco: Never Used  . Alcohol Use: No   ROS as above Medications: No current facility-administered medications for this encounter.   Current Outpatient Prescriptions  Medication Sig Dispense Refill  . diclofenac (VOLTAREN) 50 MG EC tablet Take 1 tablet (50 mg total) by mouth 2 (two) times daily as needed.  60 tablet  0  . ibuprofen (ADVIL,MOTRIN) 600 MG tablet Take 1 tablet (600 mg total) by mouth every 6 (six) hours.  30 tablet  0  . nicotine (NICODERM CQ - DOSED IN MG/24 HOURS) 21 mg/24hr patch Place 21 mg onto the skin daily.      . Prenatal Vit-Fe Fumarate-FA (PRENATAL MULTIVITAMIN) TABS tablet Take 1 tablet by mouth daily at 12 noon.        Exam:  BP 123/80  Pulse 90  Temp(Src) 98.4 F (36.9 C) (Oral)  Resp 16  SpO2 100%  LMP 10/19/2013 Gen: Well NAD Left leg: Knee is nontender with full motion. Ankle nontender no swelling full stable ligamentous exam and motion.  Left foot is tender to palpation at the proximal fifth metatarsal with normal motion capillary full pulses and sensation intact distally.  Contralateral right ankle and knee are normal with normal motion.   No results found for this or any previous visit (from the past 24 hour(s)). Dg Foot Complete Left  11/14/2013   CLINICAL DATA:  Larey Seat last night with pain and  swelling lateral side of foot  EXAM: LEFT FOOT - COMPLETE 3+ VIEW  COMPARISON:  None.  FINDINGS: There is no evidence of fracture or dislocation. There is no evidence of arthropathy or other focal bone abnormality. Soft tissues are unremarkable.  IMPRESSION: Negative.   Electronically Signed   By: Esperanza Heir [name]   On: 11/14/2013 14:25    Assessment and Plan: 28 y.o. female with foot or ankle sprain.  Plan for ankle ASO brace, and NSAIDs as well as home exercise program.  Discussed warning signs or symptoms. Please see discharge instructions. Patient expresses understanding.    Rodolph Bong, MD 11/14/13 [phone]

## 2014-02-15 ENCOUNTER — Emergency Department (HOSPITAL_COMMUNITY)
Admission: EM | Admit: 2014-02-15 | Discharge: 2014-02-15 | Disposition: A | Discharge status: Home / Self Care | Payer: Medicaid Other | Attending: Emergency Medicine | Admitting: Emergency Medicine

## 2014-02-15 ENCOUNTER — Encounter (HOSPITAL_COMMUNITY): Payer: Self-pay | Admitting: Emergency Medicine

## 2014-02-15 DIAGNOSIS — E669 Obesity, unspecified: Secondary | ICD-10-CM | POA: Insufficient documentation

## 2014-02-15 DIAGNOSIS — Z87891 Personal history of nicotine dependence: Secondary | ICD-10-CM | POA: Insufficient documentation

## 2014-02-15 DIAGNOSIS — J3089 Other allergic rhinitis: Secondary | ICD-10-CM

## 2014-02-15 DIAGNOSIS — Z87828 Personal history of other (healed) physical injury and trauma: Secondary | ICD-10-CM | POA: Insufficient documentation

## 2014-02-15 DIAGNOSIS — Z79899 Other long term (current) drug therapy: Secondary | ICD-10-CM | POA: Insufficient documentation

## 2014-02-15 DIAGNOSIS — J3489 Other specified disorders of nose and nasal sinuses: Secondary | ICD-10-CM | POA: Insufficient documentation

## 2014-02-15 DIAGNOSIS — J309 Allergic rhinitis, unspecified: Secondary | ICD-10-CM | POA: Insufficient documentation

## 2014-02-15 DIAGNOSIS — Z8659 Personal history of other mental and behavioral disorders: Secondary | ICD-10-CM | POA: Insufficient documentation

## 2014-02-15 MED ORDER — FLUTICASONE PROPIONATE 50 MCG/ACT NA SUSP: 2.0000 | Freq: Every day | NASAL | Status: DC

## 2014-02-15 NOTE — Discharge Instructions (Signed)

## 2014-02-15 NOTE — ED Provider Notes (Signed)
CSN: [id number]     Arrival date & time 02/15/14  1730 History  This chart was scribed for non-physician practitioner, Fayrene Helper, PA-C working with Doug Sou, MD by Luisa Dago, ED scribe. This patient was seen in room TR08C/TR08C and the patient's care was started at 5:52 PM.    Chief Complaint  Patient presents with  . Allergies   The history is provided by the patient. No language interpreter was used.   HPI Comments: Maureen Perez is a 28 y.o. female who presents to the Emergency Department complaining of worsening seasonal allergies that started 3 days ago. Pt is complaining of associated rhinorrhea, sinus pressure, and itchy eyes. Pt states that she usually takes Claritin but has not alleviating her symptoms. She also reports taking Allegra, zyrtec, and another OTC medication which she cannot recall. Pt states that that she's always had allergies, but her symptoms are usually triggered by seasonal changes and contact to cats. Denies any fever or chills.  Past Medical History  Diagnosis Date  . Back pain   . Obesity   . Depression     no meds since pregancny  . Abnormal Pap smear     repeat WNL  . PONV (postoperative nausea and vomiting)   . Injury of tendon of left rotator cuff    Past Surgical History  Procedure Laterality Date  . Tubes in ears  age 85  . Tonsillectomy      age 89   Family History  Problem Relation Age of Onset  . Depression Mother   . Diabetes Mother   . Hypertension Mother   . COPD Mother   . Anesthesia problems Mother   . Depression Sister   . Diabetes Sister   . Kidney disease Sister   . Hypertension Sister   . Hypertension Father   . Anesthesia problems Father    History  Substance Use Topics  . Smoking status: Former Smoker -- 0.25 packs/day for 6 years    Types: Cigarettes  . Smokeless tobacco: Never Used  . Alcohol Use: No   OB History   Grav Para Term Preterm Abortions TAB SAB Ect Mult Living   0 1 0 1 0 0 2      Review of Systems  Constitutional: Negative for fever and chills.  HENT: Positive for rhinorrhea and sinus pressure. Negative for congestion, ear pain, sore throat, trouble swallowing and voice change.   Eyes: Positive for itching. Negative for discharge.  Respiratory: Negative for cough, shortness of breath and wheezing.   Cardiovascular: Negative for chest pain.  Gastrointestinal: Negative for abdominal pain.  Genitourinary: Negative.       Allergies  Cephalexin and Percocet  Home Medications   Prior to Admission medications   Medication Sig Start Date End Date Taking? Authorizing Provider  diclofenac (VOLTAREN) 50 MG EC tablet Take 1 tablet (50 mg total) by mouth 2 (two) times daily as needed. 11/14/13   Rodolph Bong, MD  ibuprofen (ADVIL,MOTRIN) 600 MG tablet Take 1 tablet (600 mg total) by mouth every 6 (six) hours. 08/11/13   Vale Haven, MD  nicotine (NICODERM CQ - DOSED IN MG/24 HOURS) 21 mg/24hr patch Place 21 mg onto the skin daily.    Historical Provider, MD  Prenatal Vit-Fe Fumarate-FA (PRENATAL MULTIVITAMIN) TABS tablet Take 1 tablet by mouth daily at 12 noon.    Historical Provider, MD   BP 136/76  Pulse 70  Temp(Src) 97.5 F (36.4 C) (Oral)  Resp 18  SpO2 100%  Physical Exam  Nursing note and vitals reviewed. Constitutional: She is oriented to person, place, and time. She appears well-developed and well-nourished. No distress.  HENT:  Head: Normocephalic and atraumatic.  Right Ear: External ear normal.  Left Ear: External ear normal.  Mouth/Throat: Oropharynx is clear and moist.  Nildly boggy terbinates.  Eyes: Conjunctivae and EOM are normal. Pupils are equal, round, and reactive to light.     Neck: Neck supple.  Cardiovascular: Normal rate, regular rhythm and normal heart sounds.  Exam reveals no gallop and no friction rub.   No murmur heard. Pulmonary/Chest: Effort normal. No respiratory distress.  Musculoskeletal: Normal range of motion.   Neurological: She is alert and oriented to person, place, and time.  Skin: Skin is warm and dry.  Psychiatric: She has a normal mood and affect. Her behavior is normal.    ED Course  Procedures (including critical care time)  DIAGNOSTIC STUDIES: Oxygen Saturation is 100% on RA, normal by my interpretation.    COORDINATION OF CARE: 5:56 PM- recommend flonase for allergies.  Pt advised of plan for treatment and pt agrees.  Labs Review Labs Reviewed - No data to display  Imaging Review No results found.   EKG Interpretation None      MDM   Final diagnoses:  Non-seasonal allergic rhinitis   BP 136/76  Pulse 70  Temp(Src) 97.5 F (36.4 C) (Oral)  Resp 18  SpO2 100%   I personally performed the services described in this documentation, which was scribed in my presence. The recorded information has been reviewed and is accurate.    Fayrene Helper, PA-C 02/15/14 [phone]

## 2014-02-15 NOTE — ED Notes (Signed)
Pt was taken off of allergy meds and has developed allergy symptoms since that time.

## 2014-02-16 NOTE — ED Provider Notes (Signed)
Medical screening examination/treatment/procedure(s) were performed by non-physician practitioner and as supervising physician I was immediately available for consultation/collaboration.   EKG Interpretation None       Doug Sou, MD 02/16/14 1610

## 2014-03-21 ENCOUNTER — Encounter (HOSPITAL_COMMUNITY): Payer: Self-pay | Admitting: Emergency Medicine

## 2014-03-21 ENCOUNTER — Emergency Department (HOSPITAL_COMMUNITY)
Admission: EM | Admit: 2014-03-21 | Discharge: 2014-03-21 | Disposition: A | Discharge status: Home / Self Care | Payer: Medicaid Other | Attending: Emergency Medicine | Admitting: Emergency Medicine

## 2014-03-21 DIAGNOSIS — Z87828 Personal history of other (healed) physical injury and trauma: Secondary | ICD-10-CM | POA: Insufficient documentation

## 2014-03-21 DIAGNOSIS — Z720 Tobacco use: Secondary | ICD-10-CM | POA: Insufficient documentation

## 2014-03-21 DIAGNOSIS — E669 Obesity, unspecified: Secondary | ICD-10-CM | POA: Insufficient documentation

## 2014-03-21 DIAGNOSIS — Z8659 Personal history of other mental and behavioral disorders: Secondary | ICD-10-CM | POA: Insufficient documentation

## 2014-03-21 DIAGNOSIS — Z7251 High risk heterosexual behavior: Secondary | ICD-10-CM | POA: Insufficient documentation

## 2014-03-21 DIAGNOSIS — B349 Viral infection, unspecified: Secondary | ICD-10-CM | POA: Insufficient documentation

## 2014-03-21 DIAGNOSIS — R112 Nausea with vomiting, unspecified: Secondary | ICD-10-CM | POA: Insufficient documentation

## 2014-03-21 DIAGNOSIS — Z3202 Encounter for pregnancy test, result negative: Secondary | ICD-10-CM | POA: Insufficient documentation

## 2014-03-21 LAB — URINALYSIS, ROUTINE W REFLEX MICROSCOPIC
Bilirubin Urine: NEGATIVE
Glucose, UA: NEGATIVE mg/dL
Hgb urine dipstick: NEGATIVE
KETONES UR: NEGATIVE mg/dL
LEUKOCYTES UA: NEGATIVE
Nitrite: NEGATIVE
PROTEIN: NEGATIVE mg/dL
Specific Gravity, Urine: 1.02 (ref 1.005–1.030)
Urobilinogen, UA: 1 mg/dL (ref 0.0–1.0)
pH: 5.5 (ref 5.0–8.0)

## 2014-03-21 LAB — WET PREP, GENITAL
Clue Cells Wet Prep HPF POC: NONE SEEN
Trich, Wet Prep: NONE SEEN
Yeast Wet Prep HPF POC: NONE SEEN

## 2014-03-21 LAB — POC URINE PREG, ED: PREG TEST UR: NEGATIVE

## 2014-03-21 NOTE — Discharge Instructions (Signed)

## 2014-03-21 NOTE — ED Provider Notes (Signed)
CSN: [id number]     Arrival date & time 03/21/14  1510 History   First MD Initiated Contact with Patient 03/21/14 1545     Chief Complaint  Patient presents with  . Nausea  . Vaginal Discharge  . Possible Pregnancy     (Consider location/radiation/quality/duration/timing/severity/associated sxs/prior Treatment) HPI Comments: Patient here complaining of 2-3 weeks of nausea with emesis x1 today which was nonbilious. She has had URI symptoms along with positive sick exposures. Denies cough or dyspnea. No headache or photophobia. No rashes noted. Denies any diarrhea. She is also some white vaginal discharge without dysuria or hematuria. No vaginal bleeding noted. Denies abdominal pain. Symptoms persisted and nothing makes them better or worse. No treatment used prior to arrival.  Patient is a 28 y.o. female presenting with vaginal discharge and pregnancy problem. The history is provided by the patient.  Vaginal Discharge Possible Pregnancy  Primary symptoms include vaginal discharge.    Past Medical History  Diagnosis Date  . Back pain   . Obesity   . Depression     no meds since pregancny  . Abnormal Pap smear     repeat WNL  . PONV (postoperative nausea and vomiting)   . Injury of tendon of left rotator cuff    Past Surgical History  Procedure Laterality Date  . Tubes in ears  age 89  . Tonsillectomy      age 28   Family History  Problem Relation Age of Onset  . Depression Mother   . Diabetes Mother   . Hypertension Mother   . COPD Mother   . Anesthesia problems Mother   . Depression Sister   . Diabetes Sister   . Kidney disease Sister   . Hypertension Sister   . Hypertension Father   . Anesthesia problems Father    History  Substance Use Topics  . Smoking status: Current Every Day Smoker -- 0.25 packs/day for 6 years    Types: Cigarettes  . Smokeless tobacco: Never Used  . Alcohol Use: No   OB History   Grav Para Term Preterm Abortions TAB SAB Ect Mult Living    3 2 2  0 1 0 1 0 0 2     Review of Systems  Genitourinary: Positive for vaginal discharge.  All other systems reviewed and are negative.     Allergies  Cephalexin and Percocet  Home Medications   Prior to Admission medications   Medication Sig Start Date End Date Taking? Authorizing Provider  acetaminophen (TYLENOL) 500 MG tablet Take 500 mg by mouth every 6 (six) hours as needed for mild pain.    Historical Provider, MD  fluticasone (FLONASE) 50 MCG/ACT nasal spray Place 2 sprays into both nostrils daily. 02/15/14   Fayrene HelperBowie Tran, PA-C   BP 122/75  Pulse 81  Temp(Src) 98.1 F (36.7 C) (Oral)  Resp 18  Ht 5\' 4"  (1.626 m)  Wt 150 lb (68.04 kg)  BMI 25.73 kg/m2  SpO2 100%  LMP 02/15/2014 Physical Exam  Nursing note and vitals reviewed. Constitutional: She is oriented to person, place, and time. She appears well-developed and well-nourished.  Non-toxic appearance. No distress.  HENT:  Head: Normocephalic and atraumatic.  Eyes: Conjunctivae, EOM and lids are normal. Pupils are equal, round, and reactive to light.  Neck: Normal range of motion. Neck supple. No tracheal deviation present. No mass present.  Cardiovascular: Normal rate, regular rhythm and normal heart sounds.  Exam reveals no gallop.   No murmur heard. Pulmonary/Chest: Effort  normal and breath sounds normal. No stridor. No respiratory distress. She has no decreased breath sounds. She has no wheezes. She has no rhonchi. She has no rales.  Abdominal: Soft. Normal appearance and bowel sounds are normal. She exhibits no distension. There is no tenderness. There is no rebound and no CVA tenderness.  Genitourinary: Vaginal discharge found.  Musculoskeletal: Normal range of motion. She exhibits no edema and no tenderness.  Neurological: She is alert and oriented to person, place, and time. She has normal strength. No cranial nerve deficit or sensory deficit. GCS eye subscore is 4. GCS verbal subscore is 5. GCS motor subscore  is 6.  Skin: Skin is warm and dry. No abrasion and no rash noted.  Psychiatric: She has a normal mood and affect. Her speech is normal and behavior is normal.    ED Course  Procedures (including critical care time) Labs Review Labs Reviewed  WET PREP, GENITAL  GC/CHLAMYDIA PROBE AMP  URINALYSIS, ROUTINE W REFLEX MICROSCOPIC  POC URINE PREG, ED    Imaging Review No results found.   EKG Interpretation None      MDM   Final diagnoses:  None    Patient here with likely viral illness in stable for discharge    Toy Baker, MD 03/21/14 1735

## 2014-03-21 NOTE — ED Notes (Signed)
Presents with 2-3 weeks of nausea, dnies pain, intermittent headaches with sensitivity to light and sound, thick white vaginal discharge with slight odor and missing period. LMP 02/15/2014. Pt denies pain.

## 2014-03-22 LAB — GC/CHLAMYDIA PROBE AMP
CT Probe RNA: NEGATIVE
GC Probe RNA: NEGATIVE

## 2014-04-18 ENCOUNTER — Encounter (HOSPITAL_COMMUNITY): Payer: Self-pay | Admitting: Emergency Medicine

## 2014-06-06 ENCOUNTER — Emergency Department (HOSPITAL_COMMUNITY)
Admission: EM | Admit: 2014-06-06 | Discharge: 2014-06-06 | Disposition: A | Discharge status: Home / Self Care | Payer: Medicaid Other | Attending: Emergency Medicine | Admitting: Emergency Medicine

## 2014-06-06 ENCOUNTER — Encounter (HOSPITAL_COMMUNITY): Payer: Self-pay | Admitting: Family Medicine

## 2014-06-06 DIAGNOSIS — R1110 Vomiting, unspecified: Secondary | ICD-10-CM | POA: Insufficient documentation

## 2014-06-06 DIAGNOSIS — Z3202 Encounter for pregnancy test, result negative: Secondary | ICD-10-CM | POA: Insufficient documentation

## 2014-06-06 DIAGNOSIS — R197 Diarrhea, unspecified: Secondary | ICD-10-CM | POA: Insufficient documentation

## 2014-06-06 DIAGNOSIS — Z87828 Personal history of other (healed) physical injury and trauma: Secondary | ICD-10-CM | POA: Insufficient documentation

## 2014-06-06 DIAGNOSIS — R6883 Chills (without fever): Secondary | ICD-10-CM | POA: Insufficient documentation

## 2014-06-06 DIAGNOSIS — E669 Obesity, unspecified: Secondary | ICD-10-CM | POA: Insufficient documentation

## 2014-06-06 DIAGNOSIS — R1013 Epigastric pain: Secondary | ICD-10-CM | POA: Insufficient documentation

## 2014-06-06 DIAGNOSIS — Z7951 Long term (current) use of inhaled steroids: Secondary | ICD-10-CM | POA: Insufficient documentation

## 2014-06-06 DIAGNOSIS — Z720 Tobacco use: Secondary | ICD-10-CM | POA: Insufficient documentation

## 2014-06-06 DIAGNOSIS — Z8659 Personal history of other mental and behavioral disorders: Secondary | ICD-10-CM | POA: Insufficient documentation

## 2014-06-06 LAB — URINALYSIS, ROUTINE W REFLEX MICROSCOPIC
Bilirubin Urine: NEGATIVE
Glucose, UA: NEGATIVE mg/dL
Hgb urine dipstick: NEGATIVE
Ketones, ur: NEGATIVE mg/dL
LEUKOCYTES UA: NEGATIVE
Nitrite: NEGATIVE
PROTEIN: NEGATIVE mg/dL
Specific Gravity, Urine: 1.01 (ref 1.005–1.030)
Urobilinogen, UA: 0.2 mg/dL (ref 0.0–1.0)
pH: 5.5 (ref 5.0–8.0)

## 2014-06-06 LAB — COMPREHENSIVE METABOLIC PANEL
ALT: 11 U/L (ref 0–35)
AST: 13 U/L (ref 0–37)
Albumin: 3.7 g/dL (ref 3.5–5.2)
Alkaline Phosphatase: 67 U/L (ref 39–117)
Anion gap: 9 (ref 5–15)
BUN: 9 mg/dL (ref 6–23)
CHLORIDE: 105 meq/L (ref 96–112)
CO2: 26 meq/L (ref 19–32)
Calcium: 9.6 mg/dL (ref 8.4–10.5)
Creatinine, Ser: 0.75 mg/dL (ref 0.50–1.10)
GFR calc Af Amer: >90 mL/min (ref >90)
Glucose, Bld: 127 mg/dL — ABNORMAL HIGH (ref 70–99)
POTASSIUM: 4.6 meq/L (ref 3.7–5.3)
Sodium: 140 mEq/L (ref 137–147)
Total Protein: 6.5 g/dL (ref 6.0–8.3)

## 2014-06-06 LAB — CBC WITH DIFFERENTIAL/PLATELET
BASOS ABS: 0 10*3/uL (ref 0.0–0.1)
Basophils Relative: 0 % (ref 0–1)
Eosinophils Absolute: 0.5 10*3/uL (ref 0.0–0.7)
Eosinophils Relative: 4 % (ref 0–5)
HCT: 38.9 % (ref 36.0–46.0)
Hemoglobin: 12.5 g/dL (ref 12.0–15.0)
LYMPHS PCT: 34 % (ref 12–46)
Lymphs Abs: 3.6 10*3/uL (ref 0.7–4.0)
MCH: 28.3 pg (ref 26.0–34.0)
MCHC: 32.1 g/dL (ref 30.0–36.0)
MCV: 88.2 fL (ref 78.0–100.0)
Monocytes Absolute: 0.5 10*3/uL (ref 0.1–1.0)
Monocytes Relative: 5 % (ref 3–12)
NEUTROS ABS: 5.9 10*3/uL (ref 1.7–7.7)
NEUTROS PCT: 57 % (ref 43–77)
PLATELETS: 254 10*3/uL (ref 150–400)
RBC: 4.41 MIL/uL (ref 3.87–5.11)
RDW: 13.4 % (ref 11.5–15.5)
WBC: 10.5 10*3/uL (ref 4.0–10.5)

## 2014-06-06 LAB — LIPASE, BLOOD: Lipase: 38 U/L (ref 11–59)

## 2014-06-06 LAB — PREGNANCY, URINE: PREG TEST UR: NEGATIVE

## 2014-06-06 MED ORDER — ONDANSETRON HCL 4 MG PO TABS: 4.0000 mg | ORAL_TABLET | Freq: Four times a day (QID) | ORAL | Status: DC

## 2014-06-06 MED ORDER — ONDANSETRON HCL 4 MG/2ML IJ SOLN
4.0000 mg | Freq: Once | INTRAMUSCULAR | Status: AC
  Administered 2014-06-06: 4 mg via INTRAVENOUS
  Filled 2014-06-06: qty 2

## 2014-06-06 MED ORDER — SODIUM CHLORIDE 0.9 % IV BOLUS (SEPSIS)
1000.0000 mL | Freq: Once | INTRAVENOUS | Status: AC
  Administered 2014-06-06: 1000 mL via INTRAVENOUS

## 2014-06-06 NOTE — ED Provider Notes (Signed)
CSN: [id number]     Arrival date & time 06/06/14  1829 History   First MD Initiated Contact with Patient 06/06/14 2013     Chief Complaint  Patient presents with  . Emesis  . Generalized Body Aches     (Consider location/radiation/quality/duration/timing/severity/associated sxs/prior Treatment) Patient is a 28 y.o. female presenting with vomiting.  Emesis Severity:  Moderate Duration:  1 day Timing:  Constant Quality:  Stomach contents Progression:  Unchanged Chronicity:  New Relieved by:  Nothing Associated symptoms: abdominal pain (periumbilical, cramping. ), chills and diarrhea (for a few weeks, no significant change today.)   Associated symptoms: no fever     Past Medical History  Diagnosis Date  . Back pain   . Obesity   . Depression     no meds since pregancny  . Abnormal Pap smear     repeat WNL  . PONV (postoperative nausea and vomiting)   . Injury of tendon of left rotator cuff    Past Surgical History  Procedure Laterality Date  . Tubes in ears  age 89  . Tonsillectomy      age [age]   Family History  Problem Relation Age of Onset  . Depression Mother   . Diabetes Mother   . Hypertension Mother   . COPD Mother   . Anesthesia problems Mother   . Depression Sister   . Diabetes Sister   . Kidney disease Sister   . Hypertension Sister   . Hypertension Father   . Anesthesia problems Father    History  Substance Use Topics  . Smoking status: Current Every Day Smoker -- 0.25 packs/day for 6 years    Types: Cigarettes  . Smokeless tobacco: Never Used  . Alcohol Use: No   OB History    Gravida Para Term Preterm AB TAB SAB Ectopic Multiple Living   3 2 2  0 1 0 1 0 0 2     Review of Systems  Constitutional: Positive for chills.  Gastrointestinal: Positive for vomiting, abdominal pain (periumbilical, cramping. ) and diarrhea (for a few weeks, no significant change today.).  All other systems reviewed and are negative.     Allergies  Cephalexin  and Percocet  Home Medications   Prior to Admission medications   Medication Sig Start Date End Date Taking? Authorizing Provider  acetaminophen (TYLENOL) 500 MG tablet Take 500 mg by mouth every 6 (six) hours as needed for mild pain.    Historical Provider, MD  fluticasone (FLONASE) 50 MCG/ACT nasal spray Place 2 sprays into both nostrils daily. 02/15/14   Fayrene HelperBowie Tran, PA-C   BP 104/61 mmHg  Pulse 73  Temp(Src) 97.6 F (36.4 C)  Resp 16  SpO2 100%  LMP 05/31/2014 Physical Exam  Constitutional: She is oriented to person, place, and time. She appears well-developed and well-nourished. No distress.  HENT:  Head: Normocephalic and atraumatic.  Mouth/Throat: Oropharynx is clear and moist.  Eyes: Conjunctivae are normal. Pupils are equal, round, and reactive to light. No scleral icterus.  Neck: Neck supple.  Cardiovascular: Normal rate and intact distal pulses.   Pulmonary/Chest: Effort normal. No stridor. No respiratory distress.  Abdominal: Normal appearance. She exhibits no distension. There is tenderness (epigastrium). There is no rebound and no guarding.  Neurological: She is alert and oriented to person, place, and time.  Skin: Skin is warm and dry. No rash noted.  Psychiatric: She has a normal mood and affect. Her behavior is normal.  Nursing note and vitals reviewed.  ED Course  Procedures (including critical care time) Labs Review Labs Reviewed  COMPREHENSIVE METABOLIC PANEL - Abnormal; Notable for the following:    Glucose, Bld 127 (*)    Total Bilirubin <0.2 (*)    All other components within normal limits  URINALYSIS, ROUTINE W REFLEX MICROSCOPIC  CBC WITH DIFFERENTIAL  LIPASE, BLOOD  PREGNANCY, URINE  POC URINE PREG, ED    Imaging Review No results found.   EKG Interpretation None      MDM   Final diagnoses:  Vomiting and diarrhea    28 yo female with vomiting which started today.  Also some abdominal cramping.  Well appearing, nontoxic on exam.   Mild epigastric tenderness.  I think it's unlikely to represent biliary disease.  Also, no RLQ tenderness to suggest appendicitis.  Plans labs, IV fluids, UA, U preg, IV zofran.    Pt felt better after fluids, zofran.  Repeat abd exam showed no tenderness. Plan dc with treatment for acute vomiting/diarrheal illness.  She was well appearing, nontoxic, and symptom free at time of dc.     Warnell Foresterrey Shontel Santee, MD 06/07/14 1247

## 2014-06-06 NOTE — ED Notes (Signed)
Pt remains monitored by blood pressure and pulse ox.  

## 2014-06-06 NOTE — ED Notes (Signed)
Dr. Wofford at bedside 

## 2014-06-06 NOTE — ED Notes (Signed)
Per pt sts body aches, chills and vomiting since 2 am. sts she vomited x4. sts she has been holding down liquids and her dinner.

## 2014-06-06 NOTE — Discharge Instructions (Signed)

## 2014-06-10 IMAGING — CT CT ANGIO CHEST
2 of 7 series · 18 of 36 positions shown · IV contrast (omnipaque)
Comparison: DG CHEST 2 VIEW dated 08/16/2013

CLINICAL DATA: Recent childbirth, chest pain, lower extremity
edema. Evaluate for pulmonary embolism.

EXAM:
CT ANGIOGRAPHY CHEST WITH CONTRAST
TECHNIQUE: Multidetector CT imaging of the chest was performed using the
standard protocol during bolus administration of intravenous
contrast. Multiplanar CT image reconstructions and MIPs were
obtained to evaluate the vascular anatomy.
CONTRAST:  100mL OMNIPAQUE IOHEXOL 350 MG/ML SOLN

[Series 8: pe thins · axial · 0.68mm/px · z∈[+169,+362]mm · 17 of 219 slices shown]
[im 13/219  lung]
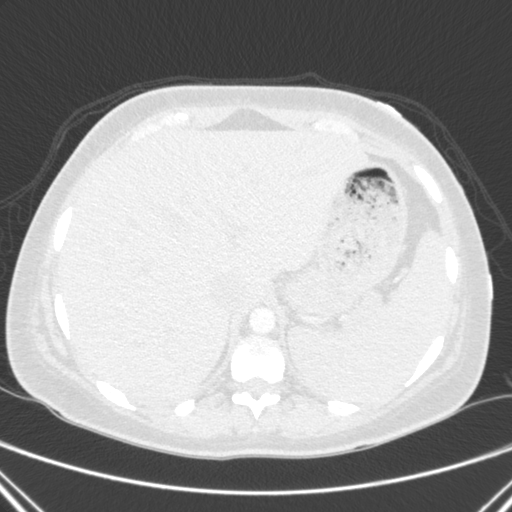
[im 25/219  mediastinal]
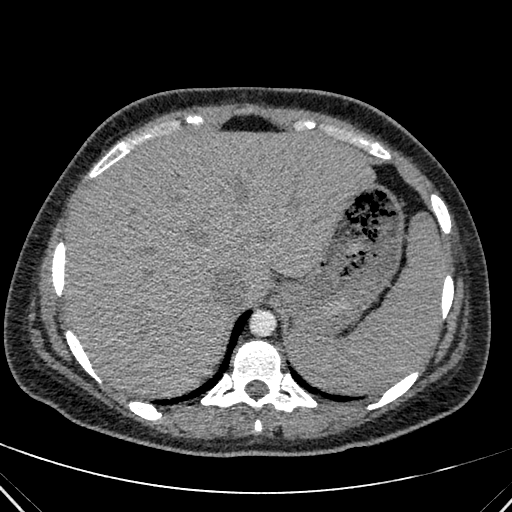
[im 37/219  lung]
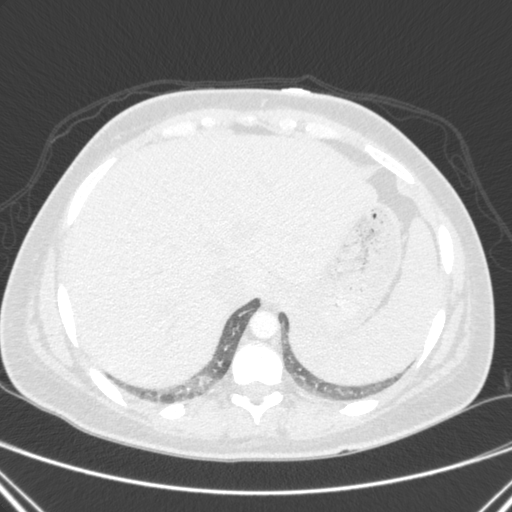
[im 49/219  mediastinal]
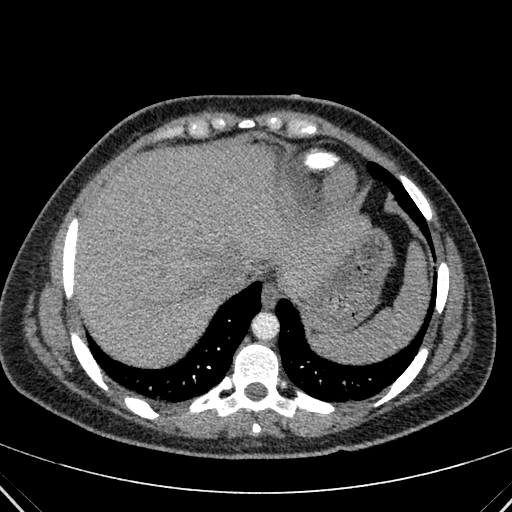
[im 61/219  lung]
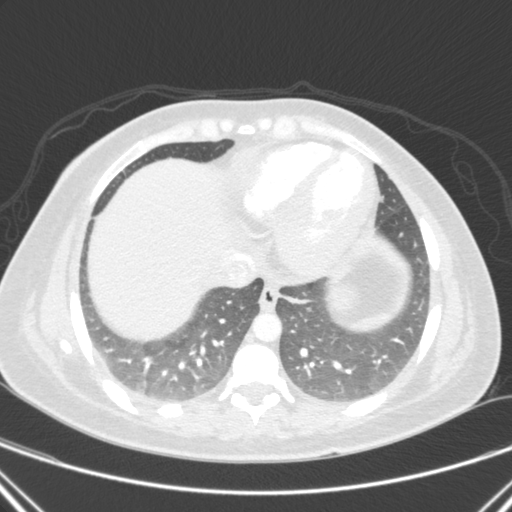
[im 73/219  mediastinal]
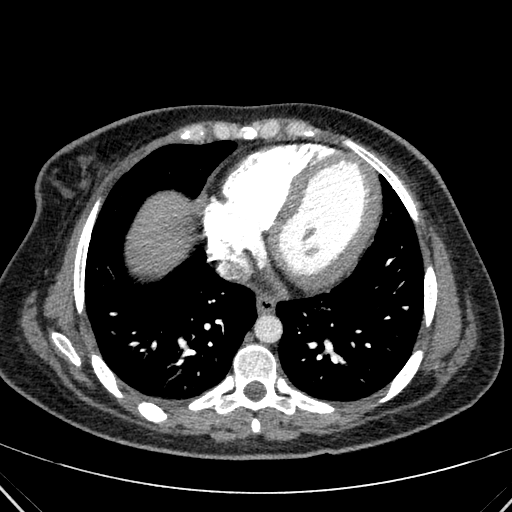
[im 85/219  lung]
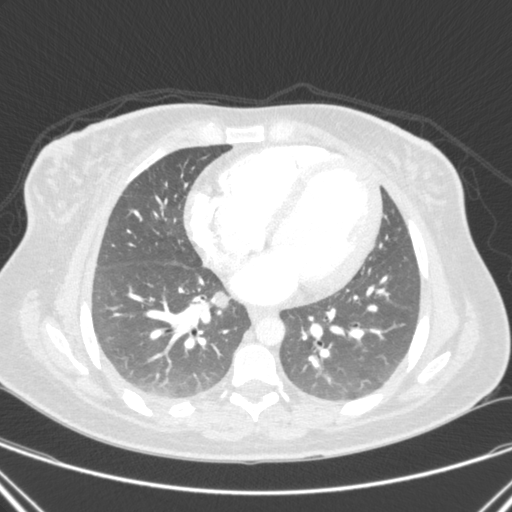
[im 97/219  mediastinal]
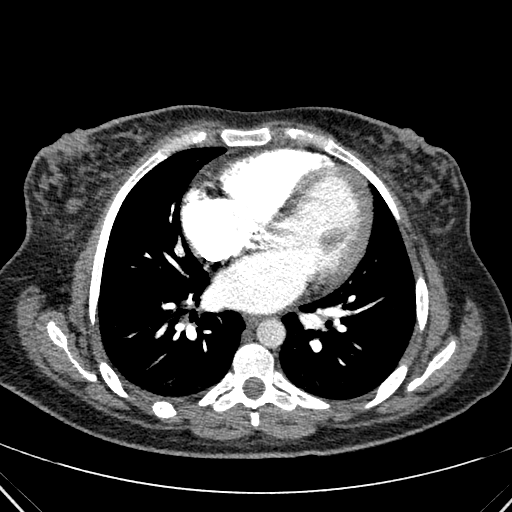
[im 110/219  lung]
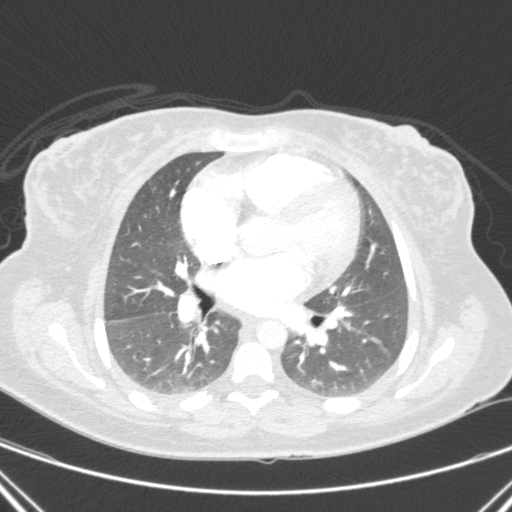
[im 122/219  mediastinal]
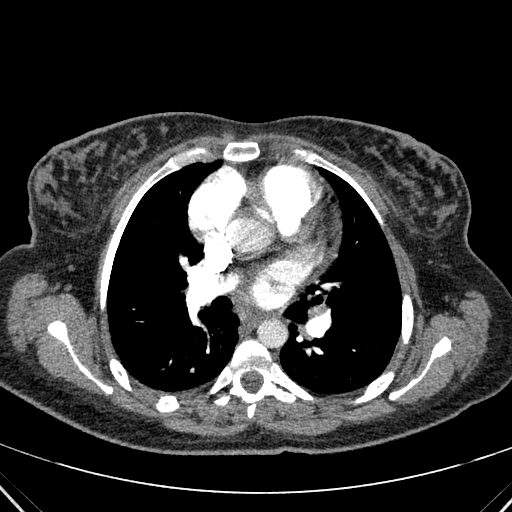
[im 134/219  lung]
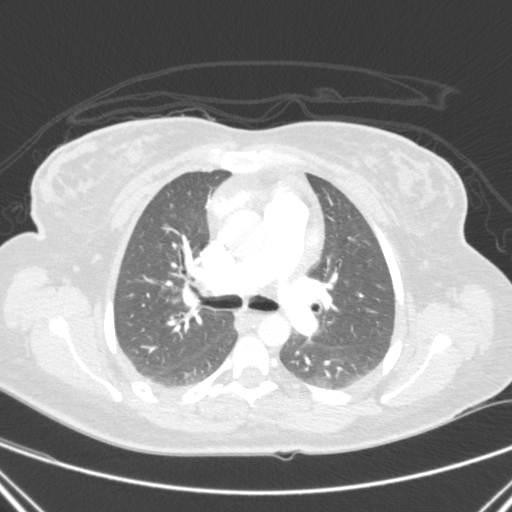
[im 146/219  mediastinal]
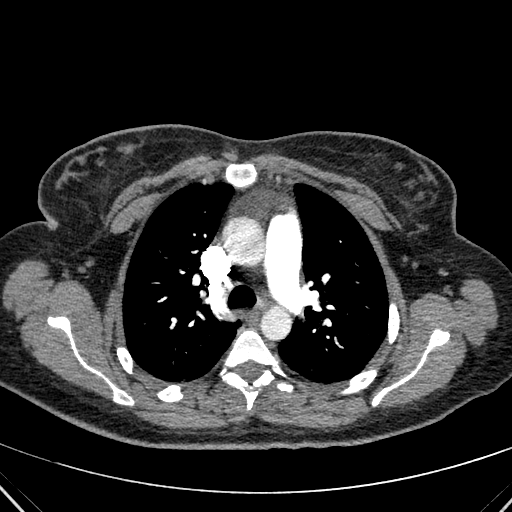
[im 158/219  lung]
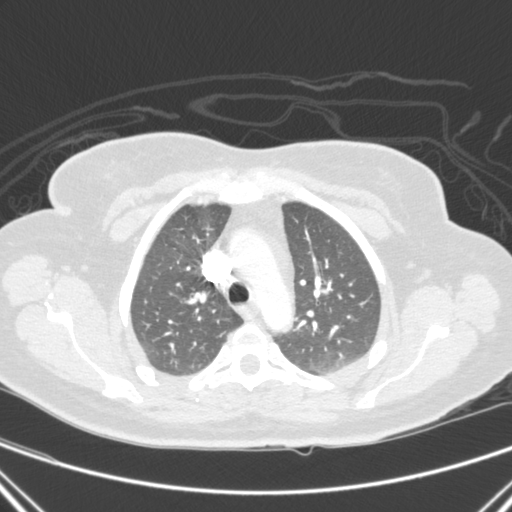
[im 170/219  mediastinal]
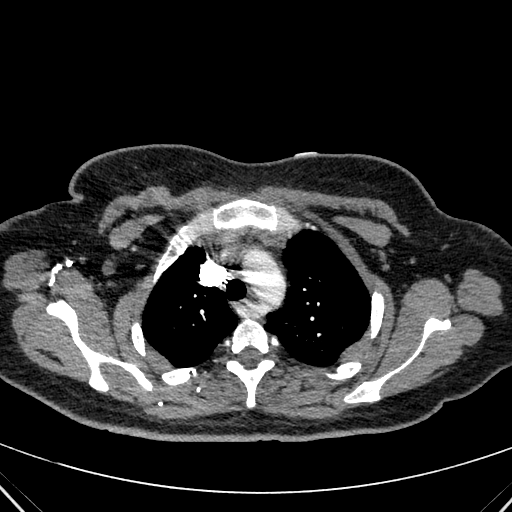
[im 182/219  lung]
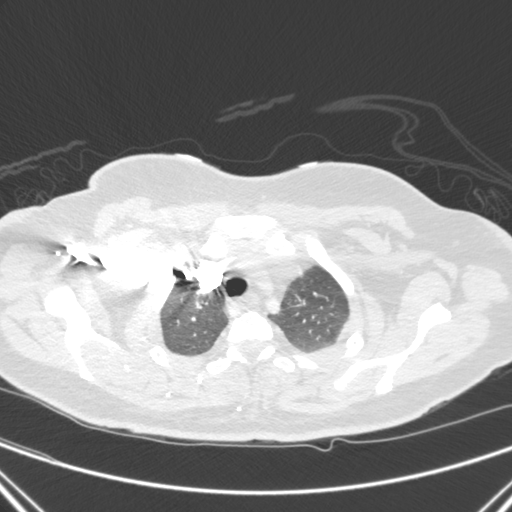
[im 194/219  mediastinal]
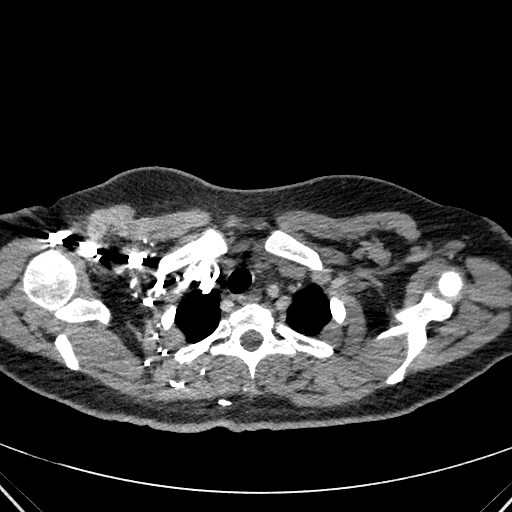
[im 206/219  lung]
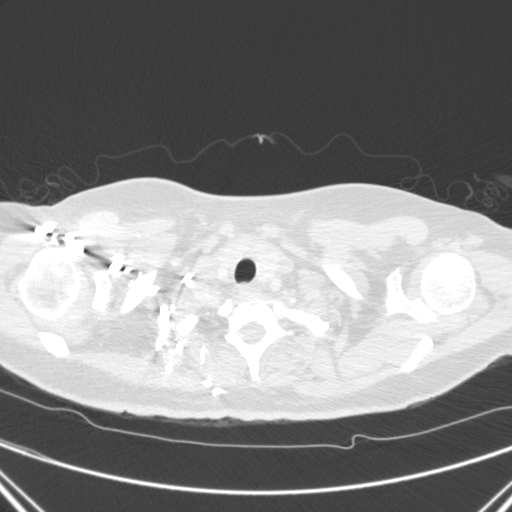

[mpr, coronals, coronal · coronal · 0.68mm/px · 1 of 112 slices shown]
[im 56/112  mediastinal]
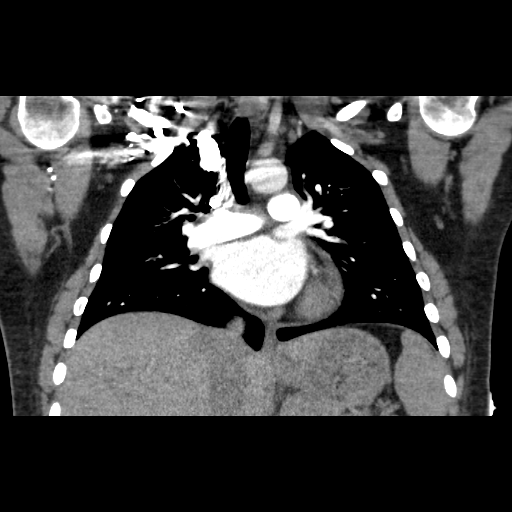

[18 of 36 positions shown; findings below may reference images not displayed]

FINDINGS: Technically excellent Examination. Main pulmonary artery is not
enlarged. No pulmonary arterial filling defects the level of the
subsegmental branches.

The heart appears mildly enlarged, trace pericardial effusion.
Thoracic aorta is normal in course and caliber. Aberrant right
subclavian artery courses posterior to the trachea and esophagus. No
pleural effusions or focal consolidations. Minimal dependent
atelectasis. 10 mm of low-density at along posterior margin of left
atrium may reflect a cyst.

No lymphadenopathy by CT size criteria. Included view of the abdomen
is nonsuspicious. Osseous structures are unremarkable. Mild
thyromegaly incompletely characterized.

Review of the MIP images confirms the above findings.
IMPRESSION: No pulmonary embolism.

Mild cardiomegaly and trace pericardial effusion.

Aberrant right subclavian artery.

Mild thyromegaly, incompletely characterized. Recommend clinical
correlation.

  By: Eygenia Imerai

## 2014-07-19 ENCOUNTER — Emergency Department (HOSPITAL_COMMUNITY)
Admission: EM | Admit: 2014-07-19 | Discharge: 2014-07-19 | Disposition: A | Discharge status: Home / Self Care | Payer: Medicaid Other | Attending: Emergency Medicine | Admitting: Emergency Medicine

## 2014-07-19 ENCOUNTER — Encounter (HOSPITAL_COMMUNITY): Payer: Self-pay | Admitting: Physical Medicine and Rehabilitation

## 2014-07-19 DIAGNOSIS — Z87828 Personal history of other (healed) physical injury and trauma: Secondary | ICD-10-CM | POA: Insufficient documentation

## 2014-07-19 DIAGNOSIS — Z7951 Long term (current) use of inhaled steroids: Secondary | ICD-10-CM | POA: Insufficient documentation

## 2014-07-19 DIAGNOSIS — Z8659 Personal history of other mental and behavioral disorders: Secondary | ICD-10-CM | POA: Insufficient documentation

## 2014-07-19 DIAGNOSIS — Z720 Tobacco use: Secondary | ICD-10-CM | POA: Insufficient documentation

## 2014-07-19 DIAGNOSIS — E669 Obesity, unspecified: Secondary | ICD-10-CM | POA: Insufficient documentation

## 2014-07-19 DIAGNOSIS — J0100 Acute maxillary sinusitis, unspecified: Secondary | ICD-10-CM | POA: Insufficient documentation

## 2014-07-19 MED ORDER — CETIRIZINE HCL 10 MG PO TABS: 10.0000 mg | ORAL_TABLET | Freq: Every day | ORAL | Status: DC

## 2014-07-19 MED ORDER — AMOXICILLIN 500 MG PO CAPS: 500.0000 mg | ORAL_CAPSULE | Freq: Two times a day (BID) | ORAL | Status: DC

## 2014-07-19 NOTE — Discharge Instructions (Signed)
Take amoxicillin as directed until gone. Refer to attached documents for more information. Take zyrtec daily. Follow up with your doctor as needed.

## 2014-07-19 NOTE — ED Provider Notes (Signed)
CSN: [id number]     Arrival date & time 07/19/14  1044 History  This chart was scribed for non-physician practitioner Emilia Beck, working with Lyanne Co, MD by Carl Best, ED Scribe. This patient was seen in room TR11C/TR11C and the patient's care was started at 11:47 AM.   Chief Complaint  Patient presents with  . Cough  . Nasal Congestion   HPI Comments: Maureen Perez is a 29 y.o. female who presents to the Emergency Department complaining of constant sinus congestion, dry cough, and sinus pressure that started 3 days ago.  She has taken Tylenol with no relief to her symptoms.  She states that her doctor recently stopped prescribing her Claritin due to its ineffectiveness.  She has been around her daughters who have both had similar symptoms.  She denies fever as an associated symptom.     Patient is a 29 y.o. female presenting with cough. The history is provided by the patient. No language interpreter was used.  Cough   Past Medical History  Diagnosis Date  . Back pain   . Obesity   . Depression     no meds since pregancny  . Abnormal Pap smear     repeat WNL  . PONV (postoperative nausea and vomiting)   . Injury of tendon of left rotator cuff    Past Surgical History  Procedure Laterality Date  . Tubes in ears  age 24  . Tonsillectomy      age 18   Family History  Problem Relation Age of Onset  . Depression Mother   . Diabetes Mother   . Hypertension Mother   . COPD Mother   . Anesthesia problems Mother   . Depression Sister   . Diabetes Sister   . Kidney disease Sister   . Hypertension Sister   . Hypertension Father   . Anesthesia problems Father    History  Substance Use Topics  . Smoking status: Current Every Day Smoker -- 0.25 packs/day for 6 years    Types: Cigarettes  . Smokeless tobacco: Never Used  . Alcohol Use: No   OB History    Gravida Para Term Preterm AB TAB SAB Ectopic Multiple Living   0 1 0 1 0 0 2     Review of  Systems  Constitutional: Negative for fatigue.  HENT: Positive for sinus pressure.   Respiratory: Positive for cough.   All other systems reviewed and are negative.     Allergies  Cephalexin and Percocet  Home Medications   Prior to Admission medications   Medication Sig Start Date End Date Taking? Authorizing Provider  fluticasone (FLONASE) 50 MCG/ACT nasal spray Place 2 sprays into both nostrils daily. 02/15/14   Fayrene Helper, PA-C  ondansetron (ZOFRAN) 4 MG tablet Take 1 tablet (4 mg total) by mouth every 6 (six) hours. 06/06/14   Candyce Churn III, MD   Triage Vitals: BP 106/49 mmHg  Pulse 72  Temp(Src) 97.5 F (36.4 C) (Oral)  Resp 18  SpO2 97%   Physical Exam  Constitutional: She is oriented to person, place, and time. She appears well-developed and well-nourished.  HENT:  Head: Normocephalic and atraumatic.  Mouth/Throat: Oropharynx is clear and moist. No oropharyngeal exudate.  Maxillary sinus tenderness to palpation.   Eyes: Conjunctivae and EOM are normal.  Neck: Normal range of motion.  Cardiovascular: Normal rate, regular rhythm and normal heart sounds.  Exam reveals no gallop and no friction rub.  No murmur heard. Pulmonary/Chest: Effort normal and breath sounds normal. No respiratory distress. She has no wheezes. She has no rales.  Abdominal: Soft. She exhibits no distension. There is no tenderness. There is no rebound.  Musculoskeletal: Normal range of motion.  Lymphadenopathy:    She has cervical adenopathy.  Neurological: She is alert and oriented to person, place, and time.  Skin: Skin is warm and dry.  Psychiatric: She has a normal mood and affect. Her behavior is normal.  Nursing note and vitals reviewed.   ED Course  Procedures (including critical care time)  DIAGNOSTIC STUDIES: Oxygen Saturation is 97% on room air, normal by my interpretation.    COORDINATION OF CARE: 11:48 AM- Discussed a clinical suspicion of sinusitis with the patient.   Will discharge patient with antibiotics and Zyrtec and the patient agreed to the treatment plan.   Labs Review Labs Reviewed - No data to display  Imaging Review No results found.   EKG Interpretation None      MDM   Final diagnoses:  Acute maxillary sinusitis, recurrence not specified    11:53 AM Patient will have amoxcillin and zyrtec for sinusitis. Vitals stable and patient afebrile.   I personally performed the services described in this documentation, which was scribed in my presence. The recorded information has been reviewed and is accurate.     Emilia BeckKaitlyn Georgenia Salim, PA-C 07/19/14 1153  Lyanne CoKevin M Campos, MD 07/19/14 [phone]

## 2014-07-19 NOTE — ED Notes (Signed)
Pt states dry cough, sinus congestion, and watery eyes. Ongoing x3 days. Respirations unlabored. NAD.

## 2014-08-24 ENCOUNTER — Emergency Department (HOSPITAL_COMMUNITY): Payer: Medicaid Other

## 2014-08-24 ENCOUNTER — Encounter (HOSPITAL_COMMUNITY): Payer: Self-pay | Admitting: General Practice

## 2014-08-24 ENCOUNTER — Emergency Department (HOSPITAL_COMMUNITY): Payer: Self-pay

## 2014-08-24 ENCOUNTER — Emergency Department (HOSPITAL_COMMUNITY)
Admission: EM | Admit: 2014-08-24 | Discharge: 2014-08-24 | Disposition: A | Discharge status: Home / Self Care | Payer: Self-pay | Attending: Emergency Medicine | Admitting: Emergency Medicine

## 2014-08-24 DIAGNOSIS — Z87828 Personal history of other (healed) physical injury and trauma: Secondary | ICD-10-CM | POA: Insufficient documentation

## 2014-08-24 DIAGNOSIS — Z720 Tobacco use: Secondary | ICD-10-CM | POA: Insufficient documentation

## 2014-08-24 DIAGNOSIS — E669 Obesity, unspecified: Secondary | ICD-10-CM | POA: Insufficient documentation

## 2014-08-24 DIAGNOSIS — Z8659 Personal history of other mental and behavioral disorders: Secondary | ICD-10-CM | POA: Insufficient documentation

## 2014-08-24 DIAGNOSIS — K8020 Calculus of gallbladder without cholecystitis without obstruction: Secondary | ICD-10-CM | POA: Insufficient documentation

## 2014-08-24 DIAGNOSIS — R1031 Right lower quadrant pain: Secondary | ICD-10-CM

## 2014-08-24 DIAGNOSIS — R52 Pain, unspecified: Secondary | ICD-10-CM

## 2014-08-24 DIAGNOSIS — Z3202 Encounter for pregnancy test, result negative: Secondary | ICD-10-CM | POA: Insufficient documentation

## 2014-08-24 DIAGNOSIS — Z79899 Other long term (current) drug therapy: Secondary | ICD-10-CM | POA: Insufficient documentation

## 2014-08-24 LAB — COMPREHENSIVE METABOLIC PANEL
ALK PHOS: 63 U/L (ref 39–117)
ALT: 14 U/L (ref 0–35)
AST: 16 U/L (ref 0–37)
Albumin: 3.9 g/dL (ref 3.5–5.2)
Anion gap: 5 (ref 5–15)
BUN: 9 mg/dL (ref 6–23)
CALCIUM: 9.2 mg/dL (ref 8.4–10.5)
CO2: 29 mmol/L (ref 19–32)
Chloride: 105 mmol/L (ref 96–112)
Creatinine, Ser: 0.79 mg/dL (ref 0.50–1.10)
GFR calc Af Amer: >90 mL/min (ref >90)
Glucose, Bld: 107 mg/dL — ABNORMAL HIGH (ref 70–99)
POTASSIUM: 4.1 mmol/L (ref 3.5–5.1)
SODIUM: 139 mmol/L (ref 135–145)
Total Bilirubin: 0.6 mg/dL (ref 0.3–1.2)
Total Protein: 6.5 g/dL (ref 6.0–8.3)

## 2014-08-24 LAB — CBC WITH DIFFERENTIAL/PLATELET
BASOS PCT: 1 % (ref 0–1)
Basophils Absolute: 0 10*3/uL (ref 0.0–0.1)
Eosinophils Absolute: 0.4 10*3/uL (ref 0.0–0.7)
Eosinophils Relative: 5 % (ref 0–5)
HEMATOCRIT: 40.5 % (ref 36.0–46.0)
HEMOGLOBIN: 13.4 g/dL (ref 12.0–15.0)
Lymphocytes Relative: 29 % (ref 12–46)
Lymphs Abs: 2.4 10*3/uL (ref 0.7–4.0)
MCH: 29.6 pg (ref 26.0–34.0)
MCHC: 33.1 g/dL (ref 30.0–36.0)
MCV: 89.4 fL (ref 78.0–100.0)
MONO ABS: 0.5 10*3/uL (ref 0.1–1.0)
Monocytes Relative: 6 % (ref 3–12)
NEUTROS PCT: 59 % (ref 43–77)
Neutro Abs: 4.9 10*3/uL (ref 1.7–7.7)
Platelets: 228 10*3/uL (ref 150–400)
RBC: 4.53 MIL/uL (ref 3.87–5.11)
RDW: 13.4 % (ref 11.5–15.5)
WBC: 8.3 10*3/uL (ref 4.0–10.5)

## 2014-08-24 LAB — URINALYSIS, ROUTINE W REFLEX MICROSCOPIC
BILIRUBIN URINE: NEGATIVE
Glucose, UA: NEGATIVE mg/dL
HGB URINE DIPSTICK: NEGATIVE
Ketones, ur: NEGATIVE mg/dL
LEUKOCYTES UA: NEGATIVE
Nitrite: NEGATIVE
PROTEIN: NEGATIVE mg/dL
SPECIFIC GRAVITY, URINE: 1.014 (ref 1.005–1.030)
Urobilinogen, UA: 1 mg/dL (ref 0.0–1.0)
pH: 7 (ref 5.0–8.0)

## 2014-08-24 LAB — WET PREP, GENITAL
CLUE CELLS WET PREP: NONE SEEN
Trich, Wet Prep: NONE SEEN
Yeast Wet Prep HPF POC: NONE SEEN

## 2014-08-24 LAB — LIPASE, BLOOD: Lipase: 23 U/L (ref 11–59)

## 2014-08-24 LAB — POC URINE PREG, ED: Preg Test, Ur: NEGATIVE

## 2014-08-24 MED ORDER — IOHEXOL 300 MG/ML  SOLN
100.0000 mL | Freq: Once | INTRAMUSCULAR | Status: AC | PRN
  Administered 2014-08-24: 100 mL via INTRAVENOUS

## 2014-08-24 MED ORDER — SODIUM CHLORIDE 0.9 % IV BOLUS (SEPSIS)
1000.0000 mL | Freq: Once | INTRAVENOUS | Status: AC
  Administered 2014-08-24: 1000 mL via INTRAVENOUS

## 2014-08-24 MED ORDER — ONDANSETRON HCL 4 MG/2ML IJ SOLN
4.0000 mg | Freq: Once | INTRAMUSCULAR | Status: AC
  Administered 2014-08-24: 4 mg via INTRAVENOUS
  Filled 2014-08-24: qty 2

## 2014-08-24 MED ORDER — MORPHINE SULFATE 4 MG/ML IJ SOLN
4.0000 mg | Freq: Once | INTRAMUSCULAR | Status: AC | PRN
  Administered 2014-08-24: 4 mg via INTRAVENOUS
  Filled 2014-08-24: qty 1

## 2014-08-24 MED ORDER — IOHEXOL 300 MG/ML  SOLN
25.0000 mL | Freq: Once | INTRAMUSCULAR | Status: AC | PRN
  Administered 2014-08-24: 25 mL via ORAL

## 2014-08-24 NOTE — ED Notes (Signed)
Pt in ultasound 

## 2014-08-24 NOTE — ED Provider Notes (Signed)
CSN: [id number]     Arrival date & time 08/24/14  4098 History   First MD Initiated Contact with Patient 08/24/14 [phone]     Chief Complaint  Patient presents with  . Abdominal Pain     (Consider location/radiation/quality/duration/timing/severity/associated sxs/prior Treatment) HPI   Maureen Perez 29 year old female who presents emergency Department with chief complaint of right lower quadrant abdominal pain. Onset this morning at 7 AM after bending over. Patient had onset of acute right lower abdominal pain which she felt radiated to her back. At the time it was a 9 out of 10. It is now currently a 7 or 8 out of 10. She had associated nausea. The pain has been improving, intermittent. She has no previous history of abdominal surgeries. Last menstrual period was 08/18/2014. She denies any urinary symptoms or vaginal symptoms. She denies a history of kidney stones. She denies a family history of kidney stones.   Past Medical History  Diagnosis Date  . Back pain   . Obesity   . Depression     no meds since pregancny  . Abnormal Pap smear     repeat WNL  . PONV (postoperative nausea and vomiting)   . Injury of tendon of left rotator cuff    Past Surgical History  Procedure Laterality Date  . Tubes in ears  age 45  . Tonsillectomy      age 49   Family History  Problem Relation Age of Onset  . Depression Mother   . Diabetes Mother   . Hypertension Mother   . COPD Mother   . Anesthesia problems Mother   . Depression Sister   . Diabetes Sister   . Kidney disease Sister   . Hypertension Sister   . Hypertension Father   . Anesthesia problems Father    History  Substance Use Topics  . Smoking status: Current Every Day Smoker -- 0.25 packs/day for 6 years    Types: Cigarettes  . Smokeless tobacco: Never Used  . Alcohol Use: No   OB History    Gravida Para Term Preterm AB TAB SAB Ectopic Multiple Living   0 1 0 1 0 0 2     Review of Systems  Ten systems  reviewed and are negative for acute change, except as noted in the HPI.    Allergies  Cephalexin and Percocet  Home Medications   Prior to Admission medications   Medication Sig Start Date End Date Taking? Authorizing Provider  cetirizine (ZYRTEC ALLERGY) 10 MG tablet Take 1 tablet (10 mg total) by mouth daily. 07/19/14  Yes Kaitlyn Szekalski, PA-C  naproxen sodium (ANAPROX) 220 MG tablet Take 220 mg by mouth daily as needed (headaches).   Yes Historical Provider, MD  amoxicillin (AMOXIL) 500 MG capsule Take 1 capsule (500 mg total) by mouth 2 (two) times daily. Patient not taking: Reported on 08/24/2014 07/19/14   Emilia Beck, PA-C  fluticasone [facility]) 50 MCG/ACT nasal spray Place 2 sprays into both nostrils daily. Patient not taking: Reported on 08/24/2014 02/15/14   Fayrene Helper, PA-C  ondansetron (ZOFRAN) 4 MG tablet Take 1 tablet (4 mg total) by mouth every 6 (six) hours. Patient not taking: Reported on 08/24/2014 06/06/14   Blake Divine, MD   BP 101/66 mmHg  Pulse 57  Temp(Src) 97.9 F (36.6 C) (Oral)  Resp 16  Ht  (1.549 m)  Wt 160 lb (72.576 kg)  BMI 30.25 kg/m2  SpO2 100%  LMP 08/17/2014  Breastfeeding? No Physical Exam  Constitutional: She is oriented to person, place, and time. She appears well-developed and well-nourished. No distress.  HENT:  Head: Normocephalic and atraumatic.  Eyes: Conjunctivae are normal. No scleral icterus.  Neck: Normal range of motion.  Cardiovascular: Normal rate, regular rhythm and normal heart sounds.  Exam reveals no gallop and no friction rub.   No murmur heard. Pulmonary/Chest: Effort normal and breath sounds normal. No respiratory distress.  Abdominal: Soft. Bowel sounds are normal. She exhibits no distension and no mass. There is tenderness (RLQ; NO CVA Tenderness). There is rebound. There is no guarding.  Neurological: She is alert and oriented to person, place, and time.  Skin: Skin is warm and dry. She is not diaphoretic.   Nursing note and vitals reviewed.   ED Course  Pelvic exam Date/Time: 08/24/2014 11:16 AM Performed by: Arthor Captain Authorized by: Arthor Captain Consent: Verbal consent obtained. Patient identity confirmed: verbally with patient Time out: Immediately prior to procedure a "time out" was called to verify the correct patient, procedure, equipment, support staff and site/side marked as required. Comments: Pelvic exam: VULVA: normal appearing vulva with no masses, tenderness or lesions, VAGINA: normal appearing vagina with normal color  no lesions, vaginal discharge - copious, green and mucoid, CERVIX: normal appearing cervix without discharge or lesions, UTERUS: uterus is normal size, shape, consistency and nontender, ADNEXA: normal adnexa in size, nontender and no masses.    (including critical care time) Labs Review Labs Reviewed  WET PREP, GENITAL - Abnormal; Notable for the following:    WBC, Wet Prep HPF POC MANY (*)    All other components within normal limits  COMPREHENSIVE METABOLIC PANEL - Abnormal; Notable for the following:    Glucose, Bld 107 (*)    All other components within normal limits  CBC WITH DIFFERENTIAL/PLATELET  LIPASE, BLOOD  URINALYSIS, ROUTINE W REFLEX MICROSCOPIC  HIV ANTIBODY (ROUTINE TESTING)  RPR  POC URINE PREG, ED  GC/CHLAMYDIA PROBE AMP ([place])    Imaging Review Ct Abdomen Pelvis W Contrast  08/24/2014   CLINICAL DATA:  Right side lower abdominal pain for 1 day, nausea  EXAM: CT ABDOMEN AND PELVIS WITH CONTRAST  TECHNIQUE: Multidetector CT imaging of the abdomen and pelvis was performed using the standard protocol following bolus administration of intravenous contrast.  CONTRAST:  OMNIPAQUE IOHEXOL 300 MG/ML  SOLN  COMPARISON:  12/09/2010  FINDINGS: Sagittal images of the spine are unremarkable. Lung bases are unremarkable. Mild hepatic fatty infiltration. Multiple gallstones are noted filling the gallbladder. The largest in gallbladder  neck region measures 8 mm. There is enhancement of gallbladder wall and thickening of gallbladder wall. Wall thickness measures 6 mm. Findings are highly suspicious for acute cholecystitis and clinical correlation is necessary. CBD measures 6 mm in diameter.  The pancreas, spleen and adrenal glands are unremarkable.  Kidneys are symmetrical in size.  No hydronephrosis or hydroureter.  No small bowel obstruction. No ascites or free air. No adenopathy. No pericecal inflammation. Normal retrocecal appendix clearly visualized axial image 55. The terminal ileum is unremarkable. The uterus and adnexa are unremarkable. Small amount of free fluid noted within posterior pelvis. The urinary bladder is unremarkable.  IMPRESSION: 1. Multiple gallstones are noted within gallbladder. There is enhancement of gallbladder mucosa . Thickening of gallbladder wall up to 6 mm. Findings are suspicious for acute cholecystitis and clinical correlation is necessary. CBD measures 6 mm in diameter. 2. No hydronephrosis or hydroureter. 3. No small bowel obstruction. 4. No  pericecal inflammation.  Normal appendix. 5. Unremarkable uterus and adnexal. Small amount of pelvic free fluid is noted.  These results were called by telephone at the time of interpretation on 08/24/2014 at 1:10 pm to Dr. Arthor CaptainABIGAIL Delyle Weider , who verbally acknowledged these results.   Electronically Signed   By: Natasha MeadLiviu  Pop [name]   On: 08/24/2014 13:09   [place] Abdomen Limited  08/24/2014   CLINICAL DATA:  RIGHT-sided abdominal pain with nausea. Onset of symptoms today. Initial encounter.  EXAM: US ABDOMEN LIMITED - RIGHT UPPER QUADRANT  COMPARISON:  CT 08/24/2014.  FINDINGS: Gallbladder:  Cholelithiasis is present. The gallbladder is essentially packed with gallstones. There is no wall thickening. Gallbladder wall measures under 3 mm. No sonographic Murphy sign. The gallbladder is partially contracted. Largest gallstone is estimated to measure about 11 mm. This is based on acoustic  reflections.  Common bile duct:  Diameter: Between 6 mm and 7 mm, enlarged for age. Correlation with bilirubin recommended.  Liver:  No focal lesion identified. Within normal limits in parenchymal echogenicity.  Exam degraded by obesity.  IMPRESSION: 1. Cholelithiasis without cholecystitis. 2. Mild enlargement of the common bile duct for age. No common duct stone identified. Correlation with bilirubin recommended.   Electronically Signed   By: Andreas NewportGeoffrey  Lamke [name]   On: 08/24/2014 15:36     EKG Interpretation None      MDM   Final diagnoses:  RLQ abdominal pain  Calculus of gallbladder without cholecystitis without obstruction    4:24 PM BP 101/66 mmHg  Pulse 57  Temp(Src) 97.9 F (36.6 C) (Oral)  Resp 16  Ht 5\' 1"  (1.549 m)  Wt 160 lb (72.576 kg)  BMI 30.25 kg/m2  SpO2 100%  LMP 08/17/2014  Breastfeeding? No Patient here with right lower quadrant abdominal pain. Her workup is pending Differential diagnosis includes kidney stone, ovarian cyst, ovarian torsion, appendicitis. Workup is pending. Pelvic exam is pending.    4:24 PM BP 101/66 mmHg  Pulse 57  Temp(Src) 97.9 F (36.6 C) (Oral)  Resp 16  Ht 5\' 1"  (1.549 m)  Wt 160 lb (72.576 kg)  BMI 30.25 kg/m2  SpO2 100%  LMP 08/17/2014  Breastfeeding? No Patient with benign pelvic exam. Will proceed to abdominal CT'.   I received a call from Dr. Ruffin FrederickPop. He states that her appendix is normal. However, he is concerned for: Cystitis. He he states that she has a gallbladder, which is packed with gallstones. He has that we proceed with ultrasound.    Patient's right upper quadrant abdominal ultrasound shows large cholelithiasis without acute cholecystitis. Her common bile duct is large for her age. However, she has no elevation in her liver enzymes. Patient will be discharged home. Her pelvic exam is also benign and I doubt any other cause of her pain including but not limited to, ovarian cysts, ovarian torsion, tubo-ovarian  abscess. She appears safe for discharge at this time. OTC medications for pain control. I discussed return precautions with the patient.   Arthor Captainbigail Zaylia Riolo, PA-C 08/24/14 1625  Vanetta MuldersScott Zackowski, MD 09/01/14 [phone]

## 2014-08-24 NOTE — ED Notes (Signed)
Pt complaining of right lower abdominal pain. Pt reports pain started this morning. Pt denies V/D, reports feeling nauseated this morning. Pt reports pain a 8/10.

## 2014-08-24 NOTE — Discharge Instructions (Signed)
Your imaging was negative today. You do have a very large amount of gallstones. Many people live with gallstones without any problem. Please read the information below regarding your diagnosis and reasons to seek immediate care or surgery. Abdominal (belly) pain can be caused by many things. Your caregiver performed an examination and possibly ordered blood/urine tests and imaging (CT scan, x-rays, ultrasound). Many cases can be observed and treated at home after initial evaluation in the emergency department. Even though you are being discharged home, abdominal pain can be unpredictable. Therefore, you need a repeated exam if your pain does not resolve, returns, or worsens. Most patients with abdominal pain don't have to be admitted to the hospital or have surgery, but serious problems like appendicitis and gallbladder attacks can start out as nonspecific pain. Many abdominal conditions cannot be diagnosed in one visit, so follow-up evaluations are very important. SEEK IMMEDIATE MEDICAL ATTENTION IF: The pain does not go away or becomes severe.  A temperature above 101 develops.  Repeated vomiting occurs (multiple episodes).  The pain becomes localized to portions of the abdomen. The right side could possibly be appendicitis. In an adult, the left lower portion of the abdomen could be colitis or diverticulitis.  Blood is being passed in stools or vomit (bright red or black tarry stools).  Return also if you develop chest pain, difficulty breathing, dizziness or fainting, or become confused, poorly responsive, or inconsolable (young children).   Abdominal Pain Many things can cause abdominal pain. Usually, abdominal pain is not caused by a disease and will improve without treatment. It can often be observed and treated at home. Your health care provider will do a physical exam and possibly order blood tests and X-rays to help determine the seriousness of your pain. However, in many cases, more time must  pass before a clear cause of the pain can be found. Before that point, your health care provider may not know if you need more testing or further treatment. HOME CARE INSTRUCTIONS  Monitor your abdominal pain for any changes. The following actions may help to alleviate any discomfort you are experiencing:  Only take over-the-counter or prescription medicines as directed by your health care provider.  Do not take laxatives unless directed to do so by your health care provider.  Try a clear liquid diet (broth, tea, or water) as directed by your health care provider. Slowly move to a bland diet as tolerated. SEEK MEDICAL CARE IF:  You have unexplained abdominal pain.  You have abdominal pain associated with nausea or diarrhea.  You have pain when you urinate or have a bowel movement.  You experience abdominal pain that wakes you in the night.  You have abdominal pain that is worsened or improved by eating food.  You have abdominal pain that is worsened with eating fatty foods.  You have a fever. SEEK IMMEDIATE MEDICAL CARE IF:   Your pain does not go away within 2 hours.  You keep throwing up (vomiting).  Your pain is felt only in portions of the abdomen, such as the right side or the left lower portion of the abdomen.  You pass bloody or black tarry stools. MAKE SURE YOU:  Understand these instructions.   Will watch your condition.   Will get help right away if you are not doing well or get worse.  Document Released: 03/13/2005 Document Revised: 06/08/2013 Document Reviewed: 02/10/2013 [facility] Patient Information 2015 [place], [place]. This information is not intended to replace advice given to you  by your health care provider. Make sure you discuss any questions you have with your health care provider.  Cholelithiasis Cholelithiasis (also called gallstones) is a form of gallbladder disease in which gallstones form in your gallbladder. The gallbladder is an organ that  stores bile made in the liver, which helps digest fats. Gallstones begin as small crystals and slowly grow into stones. Gallstone pain occurs when the gallbladder spasms and a gallstone is blocking the duct. Pain can also occur when a stone passes out of the duct.  RISK FACTORS  Being female.   Having multiple pregnancies. Health care providers sometimes advise removing diseased gallbladders before future pregnancies.   Being obese.  Eating a diet heavy in fried foods and fat.   Being older than 60 years and increasing age.   Prolonged use of medicines containing female hormones.   Having diabetes mellitus.   Rapidly losing weight.   Having a family history of gallstones (heredity).  SYMPTOMS  Nausea.   Vomiting.  Abdominal pain.   Yellowing of the skin (jaundice).   Sudden pain. It may persist from several minutes to several hours.  Fever.   Tenderness to the touch. In some cases, when gallstones do not move into the bile duct, people have no pain or symptoms. These are called "silent" gallstones.  TREATMENT Silent gallstones do not need treatment. In severe cases, emergency surgery may be required. Options for treatment include:  Surgery to remove the gallbladder. This is the most common treatment.  Medicines. These do not always work and may take 6-12 months or more to work.  Shock wave treatment (extracorporeal biliary lithotripsy). In this treatment an ultrasound machine sends shock waves to the gallbladder to break gallstones into smaller pieces that can pass into the intestines or be dissolved by medicine. HOME CARE INSTRUCTIONS   Only take over-the-counter or prescription medicines for pain, discomfort, or fever as directed by your health care provider.   Follow a low-fat diet until seen again by your health care provider. Fat causes the gallbladder to contract, which can result in pain.   Follow up with your health care provider as directed.  Attacks are almost always recurrent and surgery is usually required for permanent treatment.  SEEK IMMEDIATE MEDICAL CARE IF:   Your pain increases and is not controlled by medicines.   You have a fever or persistent symptoms for more than 2-3 days.   You have a fever and your symptoms suddenly get worse.   You have persistent nausea and vomiting.  MAKE SURE YOU:   Understand these instructions.  Will watch your condition.  Will get help right away if you are not doing well or get worse. Document Released: 05/30/2005 Document Revised: 02/03/2013 Document Reviewed: 11/25/2012 [facility] Patient Information 2015 [place]. This information is not intended to replace advice given to you by your health care provider. Make sure you discuss any questions you have with your health care provider.

## 2014-08-24 NOTE — ED Notes (Signed)
Patient transported to Ultrasound 

## 2014-08-24 NOTE — ED Notes (Signed)
Patient continues to drink contrast.  Will continue to monitor

## 2014-08-25 LAB — GC/CHLAMYDIA PROBE AMP (~~LOC~~) NOT AT ARMC
Chlamydia: NEGATIVE
Neisseria Gonorrhea: NEGATIVE

## 2014-08-25 LAB — RPR: RPR: NONREACTIVE

## 2014-08-25 LAB — HIV ANTIBODY (ROUTINE TESTING W REFLEX): HIV SCREEN 4TH GENERATION: NONREACTIVE

## 2014-08-26 ENCOUNTER — Ambulatory Visit (INDEPENDENT_AMBULATORY_CARE_PROVIDER_SITE_OTHER): Payer: Self-pay | Admitting: Family Medicine

## 2014-08-26 ENCOUNTER — Encounter: Payer: Self-pay | Admitting: Family Medicine

## 2014-08-26 VITALS — BP 129/81 | HR 80 | Temp 98.3°F | Ht 61.0 in | Wt 161.3 lb

## 2014-08-26 DIAGNOSIS — R1011 Right upper quadrant pain: Secondary | ICD-10-CM

## 2014-08-26 NOTE — Patient Instructions (Signed)
Your right upper quadrant pain is most likely from your gallstones. I have referred you to general surgery to evaluate getting your gallbladder removed. If you have fevers, severe nausea or vomiting, or worsened pain, seek immediate care.  Cholelithiasis Cholelithiasis (also called gallstones) is a form of gallbladder disease. The gallbladder is a small organ that helps you digest fats. Symptoms of gallstones are:  Feeling sick to your stomach (nausea).  Throwing up (vomiting).  Belly pain.  Yellowing of the skin (jaundice).  Sudden pain. You may feel the pain for minutes to hours.  Fever.  Pain to the touch. HOME CARE  Only take medicines as told by your doctor.  Eat a low-fat diet until you see your doctor again. Eating fat can result in pain.  Follow up with your doctor as told. Attacks usually happen time after time. Surgery is usually needed for permanent treatment. GET HELP RIGHT AWAY IF:   Your pain gets worse.  Your pain is not helped by medicines.  You have a fever and lasting symptoms for more than 2-3 days.  You have a fever and your symptoms suddenly get worse.  You keep feeling sick to your stomach and throwing up. MAKE SURE YOU:   Understand these instructions.  Will watch your condition.  Will get help right away if you are not doing well or get worse. Document Released: 11/20/2007 Document Revised: 02/03/2013 Document Reviewed: 11/25/2012 [facility] Patient Information 2015 [place], [place]. This information is not intended to replace advice given to you by your health care provider. Make sure you discuss any questions you have with your health care provider.

## 2014-08-26 NOTE — Assessment & Plan Note (Signed)
Cholelithiasis per abdominal US without cholecystitis, though CT shows cholecystitis. Afebrile well-appearing patient with stable vitals and good hyration status. Discharged from ED 3/9  For outpatient follow up. Pt amenable to surgical referral. No insurance but thinks she will have coverage in about 30 days from her job. - Referral to surgery placed. Pt given phone number to call and find out if sliding scale. - Avoid fatty foods/triggers. - Return precautions for emergent evaluation discussed including fever, severe constant pain, emesis, or other concerns. - Precepted with Dr Mauricio PoBreen.

## 2014-08-26 NOTE — Progress Notes (Signed)
Patient ID: Maureen Perez, female   DOB: 08/05/1985, 29 y.o.   MRN: [id number] Subjective:   CC: ED follow up   HPI:   Seen in the ED 3/9 for RUQ abdominal pain. Abdominal CT with cholelithiasis concerning for cholecystitis, though Right upper quadrant ultrasound with large cholelithiasis without acute cholecystitis, large common bile duct for age. No elevation in liver enzymes. Benign pelvic exam with negative UPT, UA, wet prep, GC/CHlamydia, RPR, and CBC. Gave OTC medications for pain control.  She reports 2 days of RUQ abdominal pain worse when she bends over or lays on that side. Diarrhea is more bothersome though nonbloody, with no nausea or vomiting. She describes flares of pain with salads, peanuts, onions. SHe had similar symptoms ~1 month ago. Denies fevers. Able to eat normally besides trigger foods. Denies dysuria, vaginal discharge, concern for STD, chest pain, or dyspnea.   Review of Systems - Per HPI.   PMH - allergic rhinitis, depression, GERD, insomnia, obesity, abnormal Pap smear, nonimmune rubella, smoking, tobacco abuse, chronic tinnitus    Objective:  Physical Exam BP 129/81 mmHg  Pulse 80  Temp(Src) 98.3 F (36.8 C) (Oral)  Ht 5\' 1"  (1.549 m)  Wt 161 lb 5 oz (73.171 kg)  BMI 30.50 kg/m2  LMP 08/17/2014 GEN: NAD CV: RRR, no m/r/g PULM: CTAB, normal effort ABD: RUQ TTP with positive murphey's sign, nonacute, no rebound, no obvious organomegaly HEENT: MMM, poor dentition    Assessment:     Maureen Perez is a 29 y.o. female here for RUQ pain ED follow up.    Plan:     # See problem list and after visit summary for problem-specific plans.   # Health Maintenance: Not discussed  Follow-up: Follow up PRN severe pain.   Leona SingletonMaria T Alann Avey, MD [facility]

## 2014-09-23 ENCOUNTER — Encounter (HOSPITAL_COMMUNITY): Payer: Self-pay | Admitting: *Deleted

## 2014-09-23 ENCOUNTER — Emergency Department (HOSPITAL_COMMUNITY): Payer: Self-pay

## 2014-09-23 ENCOUNTER — Emergency Department (HOSPITAL_COMMUNITY)
Admission: EM | Admit: 2014-09-23 | Discharge: 2014-09-23 | Disposition: A | Discharge status: Home / Self Care | Payer: Self-pay | Attending: Emergency Medicine | Admitting: Emergency Medicine

## 2014-09-23 DIAGNOSIS — Z3202 Encounter for pregnancy test, result negative: Secondary | ICD-10-CM | POA: Insufficient documentation

## 2014-09-23 DIAGNOSIS — J069 Acute upper respiratory infection, unspecified: Secondary | ICD-10-CM | POA: Insufficient documentation

## 2014-09-23 DIAGNOSIS — F329 Major depressive disorder, single episode, unspecified: Secondary | ICD-10-CM | POA: Insufficient documentation

## 2014-09-23 DIAGNOSIS — Z79899 Other long term (current) drug therapy: Secondary | ICD-10-CM | POA: Insufficient documentation

## 2014-09-23 DIAGNOSIS — Z720 Tobacco use: Secondary | ICD-10-CM | POA: Insufficient documentation

## 2014-09-23 DIAGNOSIS — E669 Obesity, unspecified: Secondary | ICD-10-CM | POA: Insufficient documentation

## 2014-09-23 DIAGNOSIS — H9201 Otalgia, right ear: Secondary | ICD-10-CM | POA: Insufficient documentation

## 2014-09-23 LAB — CBC
HCT: 42.1 % (ref 36.0–46.0)
HEMOGLOBIN: 13.9 g/dL (ref 12.0–15.0)
MCH: 29.4 pg (ref 26.0–34.0)
MCHC: 33 g/dL (ref 30.0–36.0)
MCV: 89 fL (ref 78.0–100.0)
Platelets: 227 10*3/uL (ref 150–400)
RBC: 4.73 MIL/uL (ref 3.87–5.11)
RDW: 13.6 % (ref 11.5–15.5)
WBC: 9.4 10*3/uL (ref 4.0–10.5)

## 2014-09-23 LAB — BASIC METABOLIC PANEL
Anion gap: 4 — ABNORMAL LOW (ref 5–15)
BUN: 12 mg/dL (ref 6–23)
CALCIUM: 8.9 mg/dL (ref 8.4–10.5)
CHLORIDE: 111 mmol/L (ref 96–112)
CO2: 22 mmol/L (ref 19–32)
Creatinine, Ser: 0.74 mg/dL (ref 0.50–1.10)
GFR calc Af Amer: >90 mL/min (ref >90)
GLUCOSE: 112 mg/dL — AB (ref 70–99)
POTASSIUM: 3.9 mmol/L (ref 3.5–5.1)
SODIUM: 137 mmol/L (ref 135–145)

## 2014-09-23 LAB — POC URINE PREG, ED: Preg Test, Ur: NEGATIVE

## 2014-09-23 LAB — I-STAT TROPONIN, ED: Troponin i, poc: 0 ng/mL (ref 0.00–0.08)

## 2014-09-23 LAB — D-DIMER, QUANTITATIVE (NOT AT ARMC): D-Dimer, Quant: <0.27 ug/mL-FEU (ref 0.00–0.48)

## 2014-09-23 MED ORDER — AZITHROMYCIN 250 MG PO TABS
500.0000 mg | ORAL_TABLET | Freq: Once | ORAL | Status: AC
  Administered 2014-09-23: 500 mg via ORAL
  Filled 2014-09-23: qty 2

## 2014-09-23 MED ORDER — TRAMADOL HCL 50 MG PO TABS: 50.0000 mg | ORAL_TABLET | Freq: Four times a day (QID) | ORAL | Status: DC | PRN

## 2014-09-23 MED ORDER — AZITHROMYCIN 250 MG PO TABS: 250.0000 mg | ORAL_TABLET | Freq: Every day | ORAL | Status: AC

## 2014-09-23 MED ORDER — TRAMADOL HCL 50 MG PO TABS
50.0000 mg | ORAL_TABLET | Freq: Once | ORAL | Status: AC
Start: 2014-09-23 — End: 2014-09-23
  Administered 2014-09-23: 50 mg via ORAL
  Filled 2014-09-23: qty 1

## 2014-09-23 NOTE — ED Notes (Signed)
Patient with reported uri sx for 2 days.  She denies any fevers.  She is also reporting chest pain for 3 days.  No s/sx of distress.  She is also complaining of right ear pain

## 2014-09-23 NOTE — ED Provider Notes (Signed)
CSN: [id number]     Arrival date & time 09/23/14  0605 History   First MD Initiated Contact with Patient 09/23/14 [phone]     Chief Complaint  Patient presents with  . Cough  . URI  . Chest Pain  . Otalgia     (Consider location/radiation/quality/duration/timing/severity/associated sxs/prior Treatment) HPI    PCP: Levert FeinsteinMcIntyre, Brittany, MD Blood pressure 107/66, pulse 60, temperature 98 F (36.7 C), temperature source Oral, resp. rate 12, height 5\' 1"  (1.549 m), last menstrual period 08/17/2014, SpO2 99 %, not currently breastfeeding.  Maureen Perez is a 29 y.o.female with a significant PMH of back pain, obesity, depression, PONV, injury of tendon or left rotator cuff presents to the ER with complaints of chest pain, cough with intermittent production of clear sputum, right ear pain for the past three days. She reports over-all not feeling well. On arrival she was tachypnic but in the room she is resting calmly and her respirations are at 12 breaths per minute at rest. She does not appear sick or diaphoretic. She says the symptoms started after blowing off some furniture with air. She also reports using her chest wall muscles more than at baseline recently.  Negative Review of Symptoms: no lower extremity swelling, SOB, lower extremity swelling, no use of birth control, abdominal pain, diarrhea, fever, vaginal discharge or diarrhea.   Past Medical History  Diagnosis Date  . Back pain   . Obesity   . Depression     no meds since pregancny  . Abnormal Pap smear     repeat WNL  . PONV (postoperative nausea and vomiting)   . Injury of tendon of left rotator cuff    Past Surgical History  Procedure Laterality Date  . Tubes in ears  age 37  . Tonsillectomy      age 29   Family History  Problem Relation Age of Onset  . Depression Mother   . Diabetes Mother   . Hypertension Mother   . COPD Mother   . Anesthesia problems Mother   . Depression Sister   . Diabetes Sister   .  Kidney disease Sister   . Hypertension Sister   . Hypertension Father   . Anesthesia problems Father    History  Substance Use Topics  . Smoking status: Current Some Day Smoker -- 0.25 packs/day for 6 years    Types: Cigarettes  . Smokeless tobacco: Never Used  . Alcohol Use: No   OB History    Gravida Para Term Preterm AB TAB SAB Ectopic Multiple Living   3 2 2  0 1 0 1 0 0 2     Review of Systems 10 Systems reviewed and are negative for acute change except as noted in the HPI.   Allergies  Cephalexin and Percocet  Home Medications   Prior to Admission medications   Medication Sig Start Date End Date Taking? Authorizing Provider  amoxicillin (AMOXIL) 500 MG capsule Take 1 capsule (500 mg total) by mouth 2 (two) times daily. Patient not taking: Reported on 08/24/2014 07/19/14   Emilia BeckKaitlyn Szekalski, PA-C  azithromycin (ZITHROMAX) 250 MG tablet Take 1 tablet (250 mg total) by mouth daily. 1 tablet every day until finished 09/23/14 09/27/14  Marlon Peliffany Dereck Agerton, PA-C  cetirizine (ZYRTEC ALLERGY) 10 MG tablet Take 1 tablet (10 mg total) by mouth daily. Patient not taking: Reported on 09/23/2014 07/19/14   Emilia BeckKaitlyn Szekalski, PA-C  fluticasone [facility](FLONASE) 50 MCG/ACT nasal spray Place 2 sprays into both nostrils daily. Patient not  taking: Reported on 08/24/2014 02/15/14   Fayrene Helper, PA-C  ondansetron (ZOFRAN) 4 MG tablet Take 1 tablet (4 mg total) by mouth every 6 (six) hours. Patient not taking: Reported on 08/24/2014 06/06/14   Blake Divine, MD  traMADol (ULTRAM) 50 MG tablet Take 1 tablet (50 mg total) by mouth every 6 (six) hours as needed. 09/23/14   Amyre Segundo Neva Seat, PA-C   BP 111/67 mmHg  Pulse 58  Temp(Src) 98 F (36.7 C) (Oral)  Resp 16  Ht  (1.549 m)  SpO2 99%  LMP 08/17/2014 Physical Exam  Constitutional: She appears well-developed and well-nourished. No distress.  Well appearing  HENT:  Head: Normocephalic and atraumatic.  Right Ear: Tympanic membrane and ear canal normal.  Left Ear:  Tympanic membrane and ear canal normal.  Nose: Nose normal. Right sinus exhibits no maxillary sinus tenderness and no frontal sinus tenderness. Left sinus exhibits no maxillary sinus tenderness and no frontal sinus tenderness.  Mouth/Throat: Uvula is midline, oropharynx is clear and moist and mucous membranes are normal.  Eyes: Pupils are equal, round, and reactive to light.  Neck: Normal range of motion. Neck supple.  Cardiovascular: Normal rate and regular rhythm.   Pulmonary/Chest: Effort normal and breath sounds normal. She has no decreased breath sounds. She has no wheezes. She has no rhonchi. She has no rales. She exhibits tenderness and bony tenderness. She exhibits no crepitus and no retraction.    Abdominal: Soft. Bowel sounds are normal. There is no tenderness. There is no rigidity, no rebound and no guarding.  Neurological: She is alert.  Skin: Skin is warm and dry. She is not diaphoretic.  Nursing note and vitals reviewed.   ED Course  Procedures (including critical care time) Labs Review Labs Reviewed  BASIC METABOLIC PANEL - Abnormal; Notable for the following:    Glucose, Bld 112 (*)    Anion gap 4 (*)    All other components within normal limits  CBC  D-DIMER, QUANTITATIVE  I-STAT TROPOININ, ED  POC URINE PREG, ED    Imaging Review Dg Chest 2 View  09/23/2014   CLINICAL DATA:  Upper respiratory infection symptoms for 2 days. Cough and chest pain. Right ear pain.  EXAM: CHEST  2 VIEW  COMPARISON:  08/16/2013  FINDINGS: The heart size and mediastinal contours are within normal limits. Both lungs are clear. The visualized skeletal structures are unremarkable.  IMPRESSION: No active cardiopulmonary disease.   Electronically Signed   By: Burman Nieves [name]   On: 09/23/2014 06:58     EKG Interpretation   Date/Time:  Friday September 23 2014 06:09:59 EDT Ventricular Rate:  81 PR Interval:  154 QRS Duration: 88 QT Interval:  378 QTC Calculation: 439 R Axis:   83 Text  Interpretation:  Normal sinus rhythm Normal ECG Confirmed by Erroll Luna [phone]) on 09/23/2014 6:33:22 AM     MDM   Final diagnoses:  URI (upper respiratory infection)   Medications  traMADol (ULTRAM) tablet 50 mg (50 mg Oral Given 09/23/14 0740)  azithromycin (ZITHROMAX) tablet 500 mg (500 mg Oral Given 09/23/14 0739)   Negative d-dimer, Troponin, pregnancy test, CBC and BMP. Chest xray unremarkable. The patient is no longer having pain after Ultram and Azithromcyin.   The patient is well appearing, she reports over-all not feeling well. Will start on Z-pack.   28 y.o.Tatia D Ribeiro's evaluation in the Emergency Department is complete. It has been determined that no acute conditions requiring further emergency intervention are  present at this time. The patient/guardian have been advised of the diagnosis and plan. We have discussed signs and symptoms that warrant return to the ED, such as changes or worsening in symptoms.  Vital signs are stable at discharge. Filed Vitals:   09/23/14 0941  BP: 111/67  Pulse: 58  Temp:   Resp: 16    Patient/guardian has voiced understanding and agreed to follow-up with the PCP or specialist.     Marlon Pel, PA-C 09/23/14 1016  Tomasita Crumble, MD 09/25/14 1610

## 2014-09-23 NOTE — ED Notes (Signed)
Pt comfortable with discharge and follow up instructions. Pt declines wheelchair, escorted to waiting area by this RN. Prescriptions x2. 

## 2014-09-23 NOTE — Discharge Instructions (Signed)
Upper Respiratory Infection, Adult An upper respiratory infection (URI) is also sometimes known as the common cold. The upper respiratory tract includes the nose, sinuses, throat, trachea, and bronchi. Bronchi are the airways leading to the lungs. Most people improve within 1 week, but symptoms can last up to 2 weeks. A residual cough may last even longer.  CAUSES Many different viruses can infect the tissues lining the upper respiratory tract. The tissues become irritated and inflamed and often become very moist. Mucus production is also common. A cold is contagious. You can easily spread the virus to others by oral contact. This includes kissing, sharing a glass, coughing, or sneezing. Touching your mouth or nose and then touching a surface, which is then touched by another person, can also spread the virus. SYMPTOMS  Symptoms typically develop 1 to 3 days after you come in contact with a cold virus. Symptoms vary from person to person. They may include:  Runny nose.  Sneezing.  Nasal congestion.  Sinus irritation.  Sore throat.  Loss of voice (laryngitis).  Cough.  Fatigue.  Muscle aches.  Loss of appetite.  Headache.  Low-grade fever. DIAGNOSIS  You might diagnose your own cold based on familiar symptoms, since most people get a cold 2 to 3 times a year. Your caregiver can confirm this based on your exam. Most importantly, your caregiver can check that your symptoms are not due to another disease such as strep throat, sinusitis, pneumonia, asthma, or epiglottitis. Blood tests, throat tests, and X-rays are not necessary to diagnose a common cold, but they may sometimes be helpful in excluding other more serious diseases. Your caregiver will decide if any further tests are required. RISKS AND COMPLICATIONS  You may be at risk for a more severe case of the common cold if you smoke cigarettes, have chronic heart disease (such as heart failure) or lung disease (such as asthma), or if  you have a weakened immune system. The very young and very old are also at risk for more serious infections. Bacterial sinusitis, middle ear infections, and bacterial pneumonia can complicate the common cold. The common cold can worsen asthma and chronic obstructive pulmonary disease (COPD). Sometimes, these complications can require emergency medical care and may be life-threatening. PREVENTION  The best way to protect against getting a cold is to practice good hygiene. Avoid oral or hand contact with people with cold symptoms. Wash your hands often if contact occurs. There is no clear evidence that vitamin C, vitamin E, echinacea, or exercise reduces the chance of developing a cold. However, it is always recommended to get plenty of rest and practice good nutrition. TREATMENT  Treatment is directed at relieving symptoms. There is no cure. Antibiotics are not effective, because the infection is caused by a virus, not by bacteria. Treatment may include:  Increased fluid intake. Sports drinks offer valuable electrolytes, sugars, and fluids.  Breathing heated mist or steam (vaporizer or shower).  Eating chicken soup or other clear broths, and maintaining good nutrition.  Getting plenty of rest.  Using gargles or lozenges for comfort.  Controlling fevers with ibuprofen or acetaminophen as directed by your caregiver.  Increasing usage of your inhaler if you have asthma. Zinc gel and zinc lozenges, taken in the first 24 hours of the common cold, can shorten the duration and lessen the severity of symptoms. Pain medicines may help with fever, muscle aches, and throat pain. A variety of non-prescription medicines are available to treat congestion and runny nose. Your caregiver   can make recommendations and may suggest nasal or lung inhalers for other symptoms.  HOME CARE INSTRUCTIONS   Only take over-the-counter or prescription medicines for pain, discomfort, or fever as directed by your  caregiver.  Use a warm mist humidifier or inhale steam from a shower to increase air moisture. This may keep secretions moist and make it easier to breathe.  Drink enough water and fluids to keep your urine clear or pale yellow.  Rest as needed.  Return to work when your temperature has returned to normal or as your caregiver advises. You may need to stay home longer to avoid infecting others. You can also use a face mask and careful hand washing to prevent spread of the virus. SEEK MEDICAL CARE IF:   After the first few days, you feel you are getting worse rather than better.  You need your caregiver's advice about medicines to control symptoms.  You develop chills, worsening shortness of breath, or brown or red sputum. These may be signs of pneumonia.  You develop yellow or brown nasal discharge or pain in the face, especially when you bend forward. These may be signs of sinusitis.  You develop a fever, swollen neck glands, pain with swallowing, or white areas in the back of your throat. These may be signs of strep throat. SEEK IMMEDIATE MEDICAL CARE IF:   You have a fever.  You develop severe or persistent headache, ear pain, sinus pain, or chest pain.  You develop wheezing, a prolonged cough, cough up blood, or have a change in your usual mucus (if you have chronic lung disease).  You develop sore muscles or a stiff neck. Document Released: 11/27/2000 Document Revised: 08/26/2011 Document Reviewed: 09/08/2013 Exit[employer] Patient Information 2015 [employer]. This information is not intended to replace advice given to you by your health care provider. Make sure you discuss any questions you have with your health care provider.  

## 2014-09-23 NOTE — ED Notes (Signed)
D-dimer sent down from [facility].

## 2014-10-27 ENCOUNTER — Other Ambulatory Visit: Payer: Self-pay | Admitting: General Surgery

## 2014-11-08 ENCOUNTER — Encounter (HOSPITAL_COMMUNITY): Payer: Self-pay | Admitting: Cardiology

## 2014-11-08 ENCOUNTER — Emergency Department (HOSPITAL_COMMUNITY)
Admission: EM | Admit: 2014-11-08 | Discharge: 2014-11-08 | Disposition: A | Discharge status: Home / Self Care | Payer: Self-pay | Attending: Emergency Medicine | Admitting: Emergency Medicine

## 2014-11-08 DIAGNOSIS — Z720 Tobacco use: Secondary | ICD-10-CM | POA: Insufficient documentation

## 2014-11-08 DIAGNOSIS — E669 Obesity, unspecified: Secondary | ICD-10-CM | POA: Insufficient documentation

## 2014-11-08 DIAGNOSIS — Z3202 Encounter for pregnancy test, result negative: Secondary | ICD-10-CM | POA: Insufficient documentation

## 2014-11-08 DIAGNOSIS — Z87828 Personal history of other (healed) physical injury and trauma: Secondary | ICD-10-CM | POA: Insufficient documentation

## 2014-11-08 DIAGNOSIS — Z8659 Personal history of other mental and behavioral disorders: Secondary | ICD-10-CM | POA: Insufficient documentation

## 2014-11-08 DIAGNOSIS — K8020 Calculus of gallbladder without cholecystitis without obstruction: Secondary | ICD-10-CM | POA: Insufficient documentation

## 2014-11-08 DIAGNOSIS — K8050 Calculus of bile duct without cholangitis or cholecystitis without obstruction: Secondary | ICD-10-CM

## 2014-11-08 LAB — COMPREHENSIVE METABOLIC PANEL
ALK PHOS: 69 U/L (ref 38–126)
ALT: 14 U/L (ref 14–54)
AST: 15 U/L (ref 15–41)
Albumin: 4 g/dL (ref 3.5–5.0)
Anion gap: 7 (ref 5–15)
BILIRUBIN TOTAL: 0.5 mg/dL (ref 0.3–1.2)
BUN: 6 mg/dL (ref 6–20)
CHLORIDE: 107 mmol/L (ref 101–111)
CO2: 27 mmol/L (ref 22–32)
Calcium: 9.2 mg/dL (ref 8.9–10.3)
Creatinine, Ser: 0.86 mg/dL (ref 0.44–1.00)
GFR calc Af Amer: >60 mL/min (ref >60)
Glucose, Bld: 87 mg/dL (ref 65–99)
POTASSIUM: 4.3 mmol/L (ref 3.5–5.1)
SODIUM: 141 mmol/L (ref 135–145)
TOTAL PROTEIN: 6.6 g/dL (ref 6.5–8.1)

## 2014-11-08 LAB — CBC WITH DIFFERENTIAL/PLATELET
Basophils Absolute: 0.1 10*3/uL (ref 0.0–0.1)
Basophils Relative: 1 % (ref 0–1)
EOS ABS: 0.3 10*3/uL (ref 0.0–0.7)
EOS PCT: 3 % (ref 0–5)
HEMATOCRIT: 42.8 % (ref 36.0–46.0)
Hemoglobin: 13.9 g/dL (ref 12.0–15.0)
LYMPHS ABS: 2.3 10*3/uL (ref 0.7–4.0)
Lymphocytes Relative: 23 % (ref 12–46)
MCH: 29.2 pg (ref 26.0–34.0)
MCHC: 32.5 g/dL (ref 30.0–36.0)
MCV: 89.9 fL (ref 78.0–100.0)
MONOS PCT: 6 % (ref 3–12)
Monocytes Absolute: 0.6 10*3/uL (ref 0.1–1.0)
NEUTROS ABS: 7 10*3/uL (ref 1.7–7.7)
Neutrophils Relative %: 67 % (ref 43–77)
Platelets: 256 10*3/uL (ref 150–400)
RBC: 4.76 MIL/uL (ref 3.87–5.11)
RDW: 13.5 % (ref 11.5–15.5)
WBC: 10.3 10*3/uL (ref 4.0–10.5)

## 2014-11-08 LAB — URINALYSIS, ROUTINE W REFLEX MICROSCOPIC
Bilirubin Urine: NEGATIVE
GLUCOSE, UA: NEGATIVE mg/dL
HGB URINE DIPSTICK: NEGATIVE
Ketones, ur: NEGATIVE mg/dL
Leukocytes, UA: NEGATIVE
NITRITE: NEGATIVE
PROTEIN: NEGATIVE mg/dL
SPECIFIC GRAVITY, URINE: 1.015 (ref 1.005–1.030)
Urobilinogen, UA: 0.2 mg/dL (ref 0.0–1.0)
pH: 6.5 (ref 5.0–8.0)

## 2014-11-08 LAB — POC URINE PREG, ED: PREG TEST UR: NEGATIVE

## 2014-11-08 MED ORDER — ONDANSETRON HCL 4 MG PO TABS: 4.0000 mg | ORAL_TABLET | Freq: Four times a day (QID) | ORAL | Status: DC

## 2014-11-08 MED ORDER — HYDROCODONE-ACETAMINOPHEN 5-325 MG PO TABS: 1.0000 | ORAL_TABLET | ORAL | Status: DC | PRN

## 2014-11-08 NOTE — ED Notes (Signed)
Pt reports right sided abd pain and vomiting. States she is suppose to have her gallbladder removed soon but is having increased pain. Also having burning with urination.

## 2014-11-08 NOTE — ED Provider Notes (Signed)
CSN: [id number]     Arrival date & time 11/08/14  1026 History   First MD Initiated Contact with Patient 11/08/14 1422     Chief Complaint  Patient presents with  . Abdominal Pain   HPI   29 YOF female presents today with right upper quadrant pain. Pt reports that for the past 3 months have been experiencing right upper and lower abdomen pain She states that she has been seen for this with ultrasound/  CT on 08/24/14 with cholelithiasis and suspected cholecystitis.. Patient reports that she followed up outpatient with her primary care, and contacted surgery but has not heard back for further evaluation and scheduling. Patient reports that 4 days ago the pain returned right upper quadrant and lower quadrant and has persisted. She denies any exacerbating event, not made worse by food, worse with deep palpation. Denies  N/V, chest pain, other abdominal pain, no changes in bowel habits characteristics, skin changes, or any other concerning findings. PT reports that todays presentation is identical to those for which she has seen previous evaluation, no addition concerns.   Past Medical History  Diagnosis Date  . Back pain   . Obesity   . Depression     no meds since pregancny  . Abnormal Pap smear     repeat WNL  . PONV (postoperative nausea and vomiting)   . Injury of tendon of left rotator cuff    Past Surgical History  Procedure Laterality Date  . Tubes in ears  age 67  . Tonsillectomy      age 29   Family History  Problem Relation Age of Onset  . Depression Mother   . Diabetes Mother   . Hypertension Mother   . COPD Mother   . Anesthesia problems Mother   . Depression Sister   . Diabetes Sister   . Kidney disease Sister   . Hypertension Sister   . Hypertension Father   . Anesthesia problems Father    History  Substance Use Topics  . Smoking status: Current Some Day Smoker -- 0.25 packs/day for 6 years    Types: Cigarettes  . Smokeless tobacco: Never Used  . Alcohol Use: No    OB History    Gravida Para Term Preterm AB TAB SAB Ectopic Multiple Living   0 1 0 1 0 0 2     Review of Systems  All other systems reviewed and are negative.     Allergies  Cephalexin and Percocet  Home Medications   Prior to Admission medications   Medication Sig Start Date End Date Taking? Authorizing Provider  acetaminophen (TYLENOL) 325 MG tablet Take 650 mg by mouth every 6 (six) hours as needed for mild pain.   Yes Historical Provider, MD  cetirizine (ZYRTEC ALLERGY) 10 MG tablet Take 1 tablet (10 mg total) by mouth daily. 07/19/14  Yes Kaitlyn Szekalski, PA-C  fluticasone (FLONASE) 50 MCG/ACT nasal spray Place 2 sprays into both nostrils daily. Patient not taking: Reported on 08/24/2014 02/15/14   Fayrene Helper, PA-C  ondansetron (ZOFRAN) 4 MG tablet Take 1 tablet (4 mg total) by mouth every 6 (six) hours. Patient not taking: Reported on 08/24/2014 06/06/14   Blake Divine, MD   BP 114/61 mmHg  Pulse 53  Temp(Src) 97.7 F (36.5 C) (Oral)  Resp 18  Ht  (1.549 m)  Wt 159 lb (72.122 kg)  BMI 30.06 kg/m2  SpO2 100%  LMP 10/16/2014 Physical Exam  Constitutional: She is oriented  to person, place, and time. She appears well-developed and well-nourished.  HENT:  Head: Normocephalic and atraumatic.  Eyes: Pupils are equal, round, and reactive to light.  Neck: Normal range of motion. Neck supple. No JVD present. No tracheal deviation present. No thyromegaly present.  Cardiovascular: Normal rate, regular rhythm, normal heart sounds and intact distal pulses.  Exam reveals no gallop and no friction rub.   No murmur heard. Pulmonary/Chest: Effort normal and breath sounds normal. No stridor. No respiratory distress. She has no wheezes. She has no rales. She exhibits no tenderness.  Abdominal: There is tenderness in the right upper quadrant and right lower quadrant. There is no rigidity, no rebound, no guarding, no CVA tenderness, no tenderness at McBurney's point and negative  Murphy's sign.  Musculoskeletal: Normal range of motion.  Lymphadenopathy:    She has no cervical adenopathy.  Neurological: She is alert and oriented to person, place, and time. Coordination normal.  Skin: Skin is warm and dry.  Psychiatric: She has a normal mood and affect. Her behavior is normal. Judgment and thought content normal.  Nursing note and vitals reviewed.   ED Course  Procedures (including critical care time) Labs Review Labs Reviewed  CBC WITH DIFFERENTIAL/PLATELET  COMPREHENSIVE METABOLIC PANEL  URINALYSIS, ROUTINE W REFLEX MICROSCOPIC  POC URINE PREG, ED    Imaging Review No results found.   EKG Interpretation None      MDM   Final diagnoses:  Biliary colic    Labs:POC urine, Urinalysis, CBC, CMP - all within normal limits   Imaging: none indicated   Consults: none  Therapeutics: none  Assessment: biliary colic   Plan: Pt presents with likely biliary colic. She reports today's episode is identical to previous with no additional findings. CT/ultrasound ABD Pelvis on 08/24/14 showed possible cholecystitis. Today pt is minimally tender to left upper lower palpation. At presentation she was experiencing pain, repeat eval with no intervention she was pain free. Pts labs normal, no fever, change in mentation, or any other concerning findings. Discharged home with prescription for pain medication and anti-nausea, instructions to follow up with surgery contact, and given strict return precautions. She verbalized her understanding and agreement to today's plan. All questions and concerns addressed.       Eyvonne MechanicJeffrey Jahari Wiginton, PA-C 11/10/14 1202  Purvis SheffieldForrest Harrison, MD 11/10/14 [phone]

## 2014-11-08 NOTE — Discharge Instructions (Signed)
Please monitor for new or worsening signs or symptoms, please return immediately if any present. Please follow-up with surgical consult for further evaluation and management.

## 2014-11-08 NOTE — ED Notes (Addendum)
Call pt name x2 to recheck vitals, no answer.

## 2014-11-16 ENCOUNTER — Telehealth: Payer: Self-pay | Admitting: Family Medicine

## 2014-11-16 NOTE — Telephone Encounter (Signed)
LMVOM for pt to call us back. Per tia the only other choice is [facility]. Maureen Perez, CMA

## 2014-11-16 NOTE — Telephone Encounter (Signed)
Mrs. Maureen Perez is calling because she would like to know if there are other options that just [facility] to have surgical procedures done. If there are other options or if this is her only option please advise her of this. Thank you, Dorothey BasemanSadie Reynolds, ASA

## 2014-12-04 ENCOUNTER — Encounter (HOSPITAL_COMMUNITY): Payer: Self-pay | Admitting: Emergency Medicine

## 2014-12-04 ENCOUNTER — Observation Stay (HOSPITAL_COMMUNITY)
Admission: EM | Admit: 2014-12-04 | Discharge: 2014-12-06 | Disposition: A | Discharge status: Home / Self Care | Payer: Self-pay | Attending: General Surgery | Admitting: General Surgery

## 2014-12-04 DIAGNOSIS — F329 Major depressive disorder, single episode, unspecified: Secondary | ICD-10-CM | POA: Insufficient documentation

## 2014-12-04 DIAGNOSIS — F17210 Nicotine dependence, cigarettes, uncomplicated: Secondary | ICD-10-CM | POA: Insufficient documentation

## 2014-12-04 DIAGNOSIS — K219 Gastro-esophageal reflux disease without esophagitis: Secondary | ICD-10-CM | POA: Insufficient documentation

## 2014-12-04 DIAGNOSIS — K8010 Calculus of gallbladder with chronic cholecystitis without obstruction: Principal | ICD-10-CM | POA: Insufficient documentation

## 2014-12-04 DIAGNOSIS — K810 Acute cholecystitis: Secondary | ICD-10-CM

## 2014-12-04 DIAGNOSIS — Z6828 Body mass index (BMI) 28.0-28.9, adult: Secondary | ICD-10-CM | POA: Insufficient documentation

## 2014-12-04 DIAGNOSIS — R112 Nausea with vomiting, unspecified: Secondary | ICD-10-CM

## 2014-12-04 DIAGNOSIS — R1011 Right upper quadrant pain: Secondary | ICD-10-CM

## 2014-12-04 DIAGNOSIS — R1110 Vomiting, unspecified: Secondary | ICD-10-CM

## 2014-12-04 DIAGNOSIS — G4700 Insomnia, unspecified: Secondary | ICD-10-CM | POA: Insufficient documentation

## 2014-12-04 DIAGNOSIS — K8020 Calculus of gallbladder without cholecystitis without obstruction: Secondary | ICD-10-CM

## 2014-12-04 DIAGNOSIS — E669 Obesity, unspecified: Secondary | ICD-10-CM | POA: Insufficient documentation

## 2014-12-04 DIAGNOSIS — Z888 Allergy status to other drugs, medicaments and biological substances status: Secondary | ICD-10-CM | POA: Insufficient documentation

## 2014-12-04 DIAGNOSIS — Z885 Allergy status to narcotic agent status: Secondary | ICD-10-CM | POA: Insufficient documentation

## 2014-12-04 HISTORY — DX: Calculus of gallbladder without cholecystitis without obstruction: K80.20

## 2014-12-04 HISTORY — DX: Family history of other specified conditions: Z84.89

## 2014-12-04 LAB — CBC WITH DIFFERENTIAL/PLATELET
Basophils Absolute: 0 10*3/uL (ref 0.0–0.1)
Basophils Relative: 0 % (ref 0–1)
Eosinophils Absolute: 0.4 10*3/uL (ref 0.0–0.7)
Eosinophils Relative: 3 % (ref 0–5)
HCT: 39.9 % (ref 36.0–46.0)
Hemoglobin: 13.4 g/dL (ref 12.0–15.0)
LYMPHS ABS: 3.1 10*3/uL (ref 0.7–4.0)
Lymphocytes Relative: 27 % (ref 12–46)
MCH: 29.9 pg (ref 26.0–34.0)
MCHC: 33.6 g/dL (ref 30.0–36.0)
MCV: 89.1 fL (ref 78.0–100.0)
Monocytes Absolute: 0.6 10*3/uL (ref 0.1–1.0)
Monocytes Relative: 5 % (ref 3–12)
NEUTROS ABS: 7.6 10*3/uL (ref 1.7–7.7)
Neutrophils Relative %: 65 % (ref 43–77)
Platelets: 265 10*3/uL (ref 150–400)
RBC: 4.48 MIL/uL (ref 3.87–5.11)
RDW: 13.5 % (ref 11.5–15.5)
WBC: 11.7 10*3/uL — ABNORMAL HIGH (ref 4.0–10.5)

## 2014-12-04 LAB — POC URINE PREG, ED: Preg Test, Ur: NEGATIVE

## 2014-12-04 MED ORDER — MORPHINE SULFATE 4 MG/ML IJ SOLN
4.0000 mg | Freq: Once | INTRAMUSCULAR | Status: AC
  Administered 2014-12-05: 4 mg via INTRAVENOUS
  Filled 2014-12-04: qty 1

## 2014-12-04 MED ORDER — ONDANSETRON HCL 4 MG/2ML IJ SOLN
4.0000 mg | Freq: Once | INTRAMUSCULAR | Status: AC
  Administered 2014-12-05: 4 mg via INTRAVENOUS
  Filled 2014-12-04: qty 2

## 2014-12-04 NOTE — ED Provider Notes (Signed)
CSN: [id number]     Arrival date & time 12/04/14  2251 History   First MD Initiated Contact with Patient 12/04/14 2309     Chief Complaint  Patient presents with  . Abdominal Pain     (Consider location/radiation/quality/duration/timing/severity/associated sxs/prior Treatment) HPI   Maureen Perez is a 29 y/o female with a h/o cholelithiasis who presents to the ED today with abdominal pain.  She had upper abdominal pain following eating hamburger helper last night, but by the time she woke up this morning the pain had resolved, however following her breakfast this morning she has had continuous sharp 10/10 upper abdomen pain that wraps around to her back.  She has had 3 episodes of non-bloody, non-bilious emesis that began tonight.  She has not tried any medicines to relieve her pain.  She denies frequent NSAID use, h/o abdominal surgery, diarrhea, fever, or chills. The patient has known cholelithiasis and has been referred to a surgeon but has been unable to schedule an appointment due to financial restraints. Last meal was at 6 PM on Sunday.  Past Medical History  Diagnosis Date  . Back pain   . Obesity   . Depression     no meds since pregancny  . Abnormal Pap smear     repeat WNL  . PONV (postoperative nausea and vomiting)   . Injury of tendon of left rotator cuff    Past Surgical History  Procedure Laterality Date  . Tubes in ears  age 29  . Tonsillectomy      age 29   Family History  Problem Relation Age of Onset  . Depression Mother   . Diabetes Mother   . Hypertension Mother   . COPD Mother   . Anesthesia problems Mother   . Depression Sister   . Diabetes Sister   . Kidney disease Sister   . Hypertension Sister   . Hypertension Father   . Anesthesia problems Father    History  Substance Use Topics  . Smoking status: Current Some Day Smoker -- 0.25 packs/day for 6 years    Types: Cigarettes  . Smokeless tobacco: Never Used  . Alcohol Use: No   OB History    Gravida Para  Term Preterm AB TAB SAB Ectopic Multiple Living   0 1 0 1 0 0 2     Review of Systems  10 Systems reviewed and are negative for acute change except as noted in the HPI.     Allergies  Cephalexin and Percocet  Home Medications   Prior to Admission medications   Medication Sig Start Date End Date Taking? Authorizing Provider  acetaminophen (TYLENOL) 325 MG tablet Take 650 mg by mouth every 6 (six) hours as needed for mild pain.   Yes Historical Provider, MD  cetirizine (ZYRTEC ALLERGY) 10 MG tablet Take 1 tablet (10 mg total) by mouth daily. Patient not taking: Reported on 12/04/2014 07/19/14   Emilia Beck, PA-C  fluticasone [facility]) 50 MCG/ACT nasal spray Place 2 sprays into both nostrils daily. Patient not taking: Reported on 08/24/2014 02/15/14   Fayrene Helper, PA-C  HYDROcodone-acetaminophen (NORCO/VICODIN) 5-325 MG per tablet Take 1 tablet by mouth every 4 (four) hours as needed. Patient not taking: Reported on 12/04/2014 11/08/14   Eyvonne Mechanic, PA-C  ondansetron (ZOFRAN) 4 MG tablet Take 1 tablet (4 mg total) by mouth every 6 (six) hours. Patient not taking: Reported on 12/04/2014 11/08/14   Eyvonne Mechanic, PA-C   BP 102/61 mmHg  Pulse  54  Temp(Src) 98.6 F (37 C) (Oral)  Resp 16  Ht 5\' 1"  (1.549 m)  Wt 154 lb (69.854 kg)  BMI 29.11 kg/m2  SpO2 100%  LMP 11/16/2014 Physical Exam  Constitutional: She appears well-developed and well-nourished. No distress.  HENT:  Head: Normocephalic and atraumatic.  Eyes: Pupils are equal, round, and reactive to light.  Neck: Normal range of motion. Neck supple.  Cardiovascular: Normal rate and regular rhythm.   Pulmonary/Chest: Effort normal.  Abdominal: Soft. There is tenderness in the right upper quadrant. There is guarding. There is no rigidity, no rebound and no CVA tenderness.  Neurological: She is alert.  Skin: Skin is warm and dry.  Nursing note and vitals reviewed.   ED Course  Procedures (including critical care  time) Labs Review Labs Reviewed  CBC WITH DIFFERENTIAL/PLATELET - Abnormal; Notable for the following:    WBC 11.7 (*)    All other components within normal limits  URINALYSIS, ROUTINE W REFLEX MICROSCOPIC (NOT AT [facility]) - Abnormal; Notable for the following:    APPearance CLOUDY (*)    Leukocytes, UA TRACE (*)    All other components within normal limits  URINE MICROSCOPIC-ADD ON - Abnormal; Notable for the following:    Squamous Epithelial / LPF MANY (*)    Bacteria, UA FEW (*)    All other components within normal limits  COMPREHENSIVE METABOLIC PANEL  LIPASE, BLOOD  POC URINE PREG, ED    Imaging Review US Abdomen Limited Ruq  12/05/2014   CLINICAL DATA:  Patient with right upper quadrant pain, nausea and vomiting.  EXAM: US ABDOMEN LIMITED - RIGHT UPPER QUADRANT  COMPARISON:  Ultrasound 08/24/2014  FINDINGS: Gallbladder:  Multiple gallstones are demonstrated within the gallbladder lumen. There is gallbladder wall thickening and suggestion of pericholecystic fluid. Positive sonographic Murphy's sign.  Common bile duct:  Diameter: Dilated measuring up to 10 mm.  Liver:  No focal lesion identified. Within normal limits in parenchymal echogenicity.  IMPRESSION: Findings most compatible with acute cholecystitis. There are multiple gallstones within the gallbladder lumen as well as gallbladder wall thickening, pericholecystic fluid and positive sonographic Murphy's sign.  Additionally there is dilatation of the common bile duct measuring up to 10 mm, raising the possibility of choledocholithiasis.   Electronically Signed   By: Annia Belt [name]   On: 12/05/2014 01:08     EKG Interpretation None      MDM   Final diagnoses:  RUQ abdominal pain  Non-intractable vomiting with nausea, vomiting of unspecified type  Acute cholecystitis    The patients labs (CMP, CBC, and lipase) are unremarkable. Her RUQ US shows acute cholecystitis.  At end of shift, Dr. Littie Deeds has agreed to call General  Surgery (1: 25 am) Patient aware of ultrasound findings, pain has been managed and she is agreeable for treatment.    Marlon Pel, PA-C 12/05/14 0125  Mirian Mo, MD 12/08/14 [phone]

## 2014-12-04 NOTE — ED Notes (Signed)
Pt. reports generalized abdominal pain and low back pain onset yesterday , denies diarrhea , no fever or chills. Pt. stated history of gallbladder disease.

## 2014-12-05 ENCOUNTER — Encounter (HOSPITAL_COMMUNITY): Payer: Self-pay | Admitting: General Practice

## 2014-12-05 ENCOUNTER — Emergency Department (HOSPITAL_COMMUNITY): Payer: Self-pay

## 2014-12-05 ENCOUNTER — Observation Stay (HOSPITAL_COMMUNITY): Payer: Self-pay

## 2014-12-05 ENCOUNTER — Encounter (HOSPITAL_COMMUNITY)
Admission: EM | Disposition: A | Discharge status: Home / Self Care | Payer: Self-pay | Source: Home / Self Care | Attending: Emergency Medicine

## 2014-12-05 ENCOUNTER — Observation Stay (HOSPITAL_COMMUNITY): Payer: MEDICAID | Admitting: Anesthesiology

## 2014-12-05 ENCOUNTER — Observation Stay (HOSPITAL_COMMUNITY): Payer: Self-pay | Admitting: Anesthesiology

## 2014-12-05 DIAGNOSIS — K810 Acute cholecystitis: Secondary | ICD-10-CM | POA: Diagnosis present

## 2014-12-05 HISTORY — PX: CHOLECYSTECTOMY: SHX55

## 2014-12-05 LAB — URINALYSIS, ROUTINE W REFLEX MICROSCOPIC
Bilirubin Urine: NEGATIVE
Glucose, UA: NEGATIVE mg/dL
Hgb urine dipstick: NEGATIVE
KETONES UR: NEGATIVE mg/dL
Nitrite: NEGATIVE
PH: 6.5 (ref 5.0–8.0)
Protein, ur: NEGATIVE mg/dL
Specific Gravity, Urine: 1.027 (ref 1.005–1.030)
Urobilinogen, UA: 1 mg/dL (ref 0.0–1.0)

## 2014-12-05 LAB — COMPREHENSIVE METABOLIC PANEL
ALT: 15 U/L (ref 14–54)
AST: 15 U/L (ref 15–41)
Albumin: 3.7 g/dL (ref 3.5–5.0)
Alkaline Phosphatase: 65 U/L (ref 38–126)
Anion gap: 9 (ref 5–15)
BILIRUBIN TOTAL: 0.4 mg/dL (ref 0.3–1.2)
BUN: 10 mg/dL (ref 6–20)
CHLORIDE: 104 mmol/L (ref 101–111)
CO2: 26 mmol/L (ref 22–32)
Calcium: 8.9 mg/dL (ref 8.9–10.3)
Creatinine, Ser: 0.83 mg/dL (ref 0.44–1.00)
GFR calc Af Amer: >60 mL/min (ref >60)
Glucose, Bld: 95 mg/dL (ref 65–99)
POTASSIUM: 3.5 mmol/L (ref 3.5–5.1)
SODIUM: 139 mmol/L (ref 135–145)
Total Protein: 6.7 g/dL (ref 6.5–8.1)

## 2014-12-05 LAB — URINE MICROSCOPIC-ADD ON

## 2014-12-05 LAB — LIPASE, BLOOD: LIPASE: 24 U/L (ref 22–51)

## 2014-12-05 SURGERY — LAPAROSCOPIC CHOLECYSTECTOMY WITH INTRAOPERATIVE CHOLANGIOGRAM
Anesthesia: General | Site: Abdomen

## 2014-12-05 MED ORDER — FENTANYL CITRATE (PF) 100 MCG/2ML IJ SOLN
INTRAMUSCULAR | Status: DC | PRN
  Administered 2014-12-05 (×3): 50 ug via INTRAVENOUS
  Administered 2014-12-05: 100 ug via INTRAVENOUS

## 2014-12-05 MED ORDER — LACTATED RINGERS IV SOLN
INTRAVENOUS | Status: DC
  Administered 2014-12-05 (×2): via INTRAVENOUS

## 2014-12-05 MED ORDER — HYDROMORPHONE HCL 1 MG/ML IJ SOLN
1.0000 mg | INTRAMUSCULAR | Status: DC | PRN
  Administered 2014-12-05: 1 mg via INTRAVENOUS
  Filled 2014-12-05: qty 1

## 2014-12-05 MED ORDER — BUPIVACAINE-EPINEPHRINE (PF) 0.25% -1:200000 IJ SOLN
INTRAMUSCULAR | Status: AC
  Filled 2014-12-05: qty 30

## 2014-12-05 MED ORDER — CHLORHEXIDINE GLUCONATE 4 % EX LIQD
1.0000 | Freq: Once | CUTANEOUS | Status: DC
Start: 2014-12-05 — End: 2014-12-05
  Filled 2014-12-05: qty 15

## 2014-12-05 MED ORDER — 0.9 % SODIUM CHLORIDE (POUR BTL) OPTIME
TOPICAL | Status: DC | PRN
  Administered 2014-12-05: 1000 mL

## 2014-12-05 MED ORDER — ROCURONIUM BROMIDE 100 MG/10ML IV SOLN
INTRAVENOUS | Status: DC | PRN
  Administered 2014-12-05: 25 mg via INTRAVENOUS

## 2014-12-05 MED ORDER — SODIUM CHLORIDE 0.9 % IR SOLN
Status: DC | PRN
  Administered 2014-12-05: 1000 mL

## 2014-12-05 MED ORDER — LIDOCAINE HCL (CARDIAC) 20 MG/ML IV SOLN
INTRAVENOUS | Status: AC
  Filled 2014-12-05: qty 5

## 2014-12-05 MED ORDER — DEXTROSE-NACL 5-0.9 % IV SOLN
INTRAVENOUS | Status: DC
  Administered 2014-12-05: 04:00:00 via INTRAVENOUS

## 2014-12-05 MED ORDER — PROPOFOL 10 MG/ML IV BOLUS
INTRAVENOUS | Status: AC
  Filled 2014-12-05: qty 20

## 2014-12-05 MED ORDER — LACTATED RINGERS IV SOLN
INTRAVENOUS | Status: DC | PRN
  Administered 2014-12-05 (×2): via INTRAVENOUS

## 2014-12-05 MED ORDER — CHLORHEXIDINE GLUCONATE 4 % EX LIQD: 1.0000 "application " | Freq: Once | CUTANEOUS | Status: DC

## 2014-12-05 MED ORDER — GLYCOPYRROLATE 0.2 MG/ML IJ SOLN
INTRAMUSCULAR | Status: AC
  Filled 2014-12-05: qty 4

## 2014-12-05 MED ORDER — ONDANSETRON HCL 4 MG/2ML IJ SOLN
INTRAMUSCULAR | Status: AC
  Filled 2014-12-05: qty 2

## 2014-12-05 MED ORDER — MIDAZOLAM HCL 5 MG/5ML IJ SOLN
INTRAMUSCULAR | Status: DC | PRN
  Administered 2014-12-05: 1 mg via INTRAVENOUS

## 2014-12-05 MED ORDER — HYDROMORPHONE HCL 1 MG/ML IJ SOLN
0.5000 mg | INTRAMUSCULAR | Status: DC | PRN
  Administered 2014-12-05 (×2): 0.5 mg via INTRAVENOUS

## 2014-12-05 MED ORDER — FENTANYL CITRATE (PF) 250 MCG/5ML IJ SOLN
INTRAMUSCULAR | Status: AC
  Filled 2014-12-05: qty 5

## 2014-12-05 MED ORDER — CIPROFLOXACIN IN D5W 400 MG/200ML IV SOLN
400.0000 mg | Freq: Two times a day (BID) | INTRAVENOUS | Status: DC
  Administered 2014-12-05 – 2014-12-06 (×4): 400 mg via INTRAVENOUS
  Filled 2014-12-05 (×5): qty 200

## 2014-12-05 MED ORDER — MORPHINE SULFATE 4 MG/ML IJ SOLN
4.0000 mg | Freq: Once | INTRAMUSCULAR | Status: AC
  Administered 2014-12-05: 4 mg via INTRAVENOUS
  Filled 2014-12-05: qty 1

## 2014-12-05 MED ORDER — CIPROFLOXACIN IN D5W 400 MG/200ML IV SOLN
400.0000 mg | INTRAVENOUS | Status: DC
  Filled 2014-12-05: qty 200

## 2014-12-05 MED ORDER — ONDANSETRON HCL 4 MG/2ML IJ SOLN
4.0000 mg | Freq: Once | INTRAMUSCULAR | Status: AC
  Administered 2014-12-05: 4 mg via INTRAVENOUS
  Filled 2014-12-05: qty 2

## 2014-12-05 MED ORDER — SODIUM CHLORIDE 0.9 % IV SOLN
INTRAVENOUS | Status: DC | PRN
  Administered 2014-12-05: 11:00:00

## 2014-12-05 MED ORDER — GLYCOPYRROLATE 0.2 MG/ML IJ SOLN
INTRAMUSCULAR | Status: DC | PRN
  Administered 2014-12-05 (×2): 0.4 mg via INTRAVENOUS

## 2014-12-05 MED ORDER — HYDROMORPHONE HCL 1 MG/ML IJ SOLN
INTRAMUSCULAR | Status: AC
  Filled 2014-12-05: qty 2

## 2014-12-05 MED ORDER — LIDOCAINE HCL (CARDIAC) 20 MG/ML IV SOLN
INTRAVENOUS | Status: DC | PRN
  Administered 2014-12-05: 100 mg via INTRAVENOUS

## 2014-12-05 MED ORDER — NEOSTIGMINE METHYLSULFATE 10 MG/10ML IV SOLN
INTRAVENOUS | Status: DC | PRN
  Administered 2014-12-05: 3 mg via INTRAVENOUS

## 2014-12-05 MED ORDER — ONDANSETRON HCL 4 MG/2ML IJ SOLN
4.0000 mg | Freq: Four times a day (QID) | INTRAMUSCULAR | Status: DC | PRN
  Administered 2014-12-05 (×3): 4 mg via INTRAVENOUS
  Filled 2014-12-05 (×2): qty 2

## 2014-12-05 MED ORDER — BUPIVACAINE-EPINEPHRINE 0.25% -1:200000 IJ SOLN
INTRAMUSCULAR | Status: DC | PRN
  Administered 2014-12-05: 30 mL

## 2014-12-05 MED ORDER — NEOSTIGMINE METHYLSULFATE 10 MG/10ML IV SOLN
INTRAVENOUS | Status: AC
  Filled 2014-12-05: qty 1

## 2014-12-05 MED ORDER — MIDAZOLAM HCL 2 MG/2ML IJ SOLN
INTRAMUSCULAR | Status: AC
  Filled 2014-12-05: qty 2

## 2014-12-05 MED ORDER — ONDANSETRON HCL 4 MG/2ML IJ SOLN
4.0000 mg | Freq: Once | INTRAMUSCULAR | Status: AC | PRN
  Administered 2014-12-05: 4 mg via INTRAVENOUS

## 2014-12-05 MED ORDER — PROPOFOL 10 MG/ML IV BOLUS
INTRAVENOUS | Status: DC | PRN
  Administered 2014-12-05: 120 mg via INTRAVENOUS

## 2014-12-05 SURGICAL SUPPLY — 44 items
APPLIER CLIP 5 13 M/L LIGAMAX5 (MISCELLANEOUS) ×3
APR CLP MED LRG 5 ANG JAW (MISCELLANEOUS) ×1
BAG SPEC RTRVL LRG 6X4 10 (ENDOMECHANICALS) ×1
BLADE SURG ROTATE 9660 (MISCELLANEOUS) IMPLANT
CANISTER SUCTION 2500CC (MISCELLANEOUS) ×3 IMPLANT
CATH REDDICK CHOLANGI 4FR 50CM (CATHETERS) ×3 IMPLANT
CHLORAPREP W/TINT 26ML (MISCELLANEOUS) ×3 IMPLANT
CLIP APPLIE 5 13 M/L LIGAMAX5 (MISCELLANEOUS) ×1 IMPLANT
COVER MAYO STAND STRL (DRAPES) ×3 IMPLANT
COVER SURGICAL LIGHT HANDLE (MISCELLANEOUS) ×3 IMPLANT
DECANTER SPIKE VIAL GLASS SM (MISCELLANEOUS) ×2 IMPLANT
DRAPE C-ARM 42X72 X-RAY (DRAPES) ×3 IMPLANT
DRAPE PROXIMA HALF (DRAPES) ×2 IMPLANT
ELECT REM PT RETURN 9FT ADLT (ELECTROSURGICAL) ×3
ELECTRODE REM PT RTRN 9FT ADLT (ELECTROSURGICAL) ×1 IMPLANT
GLOVE BIO SURGEON STRL SZ7 (GLOVE) ×2 IMPLANT
GLOVE BIO SURGEON STRL SZ7.5 (GLOVE) ×7 IMPLANT
GLOVE BIO SURGEON STRL SZ8 (GLOVE) ×2 IMPLANT
GLOVE BIOGEL PI IND STRL 7.0 (GLOVE) IMPLANT
GLOVE BIOGEL PI IND STRL 7.5 (GLOVE) IMPLANT
GLOVE BIOGEL PI INDICATOR 7.0 (GLOVE) ×6
GLOVE BIOGEL PI INDICATOR 7.5 (GLOVE) ×4
GLOVE SURG SS PI 7.5 STRL IVOR (GLOVE) ×2 IMPLANT
GOWN STRL REUS W/ TWL LRG LVL3 (GOWN DISPOSABLE) ×3 IMPLANT
GOWN STRL REUS W/TWL LRG LVL3 (GOWN DISPOSABLE) ×12
IV CATH 14GX2 1/4 (CATHETERS) ×3 IMPLANT
KIT BASIN OR (CUSTOM PROCEDURE TRAY) ×3 IMPLANT
KIT ROOM TURNOVER OR (KITS) ×3 IMPLANT
LIQUID BAND (GAUZE/BANDAGES/DRESSINGS) ×3 IMPLANT
NS IRRIG 1000ML POUR BTL (IV SOLUTION) ×3 IMPLANT
PAD ARMBOARD 7.5X6 YLW CONV (MISCELLANEOUS) ×3 IMPLANT
PENCIL BUTTON HOLSTER BLD 10FT (ELECTRODE) ×4 IMPLANT
POUCH SPECIMEN RETRIEVAL 10MM (ENDOMECHANICALS) ×3 IMPLANT
SCISSORS LAP 5X35 DISP (ENDOMECHANICALS) ×3 IMPLANT
SET IRRIG TUBING LAPAROSCOPIC (IRRIGATION / IRRIGATOR) ×3 IMPLANT
SLEEVE ENDOPATH XCEL 5M (ENDOMECHANICALS) ×6 IMPLANT
SPECIMEN JAR SMALL (MISCELLANEOUS) ×3 IMPLANT
SUT MNCRL AB 4-0 PS2 18 (SUTURE) ×3 IMPLANT
TOWEL OR 17X24 6PK STRL BLUE (TOWEL DISPOSABLE) ×3 IMPLANT
TOWEL OR 17X26 10 PK STRL BLUE (TOWEL DISPOSABLE) ×3 IMPLANT
TRAY LAPAROSCOPIC (CUSTOM PROCEDURE TRAY) ×3 IMPLANT
TROCAR XCEL BLUNT TIP 100MML (ENDOMECHANICALS) ×3 IMPLANT
TROCAR XCEL NON-BLD 5MMX100MML (ENDOMECHANICALS) ×3 IMPLANT
TUBING INSUFFLATION (TUBING) ×3 IMPLANT

## 2014-12-05 NOTE — Progress Notes (Signed)
Patient ID: Maureen Perez, female   DOB: 03/24/1986, 29 y.o.   MRN: [id number] Risks and benefits of surgery discussed with pt including some of the technical aspects and risk of common duct injury and need for open procedure and she understands and wishes to proceed. Plan for lap chole with ioc today

## 2014-12-05 NOTE — Progress Notes (Signed)
Nutrition Brief Note  Patient identified on the Malnutrition Screening Tool (MST) Report  Wt Readings from Last 15 Encounters:  12/05/14 153 lb 3.5 oz (69.5 kg)  11/08/14 159 lb (72.122 kg)  08/26/14 161 lb 5 oz (73.171 kg)  08/24/14 160 lb (72.576 kg)  03/21/14 150 lb (68.04 kg)  08/18/13 155 lb (70.308 kg)  08/16/13 159 lb (72.122 kg)  08/09/13 158 lb (71.668 kg)  08/06/13 158 lb (71.668 kg)  08/05/13 158 lb (71.668 kg)  08/02/13 158 lb (71.668 kg)  08/02/13 156 lb (70.761 kg)  07/31/13 161 lb 6.4 oz (73.211 kg)  07/29/13 158 lb (71.668 kg)  07/26/13 157 lb (71.215 kg)   The patient is a 29 year old female with known history of gallstones. Patient states that after eating a hamburger helper the night prior to admission. She states that the pain continued throughout the day. This is accompanied with nausea and vomiting.  Pt down in OR for lap chole with IOC today. Currently NPO.  Cjart review indicates good appetite PTA. Weight has been stable. UBW between 150-160#.   Body mass index is 28.97 kg/(m^2). Patient meets criteria for overweight based on current BMI.   Current diet order is NPO, patient is consuming approximately n/a% of meals at this time. Labs and medications reviewed.   No nutrition interventions warranted at this time. If nutrition issues arise, please consult RD.   Eastin Swing A. Mayford Knife, RD, LDN, CDE Pager: [phone] After hours Pager: [phone]

## 2014-12-05 NOTE — Anesthesia Preprocedure Evaluation (Signed)
Anesthesia Evaluation  Patient identified by MRN, date of birth, ID band Patient awake    Reviewed: Allergy & Precautions, NPO status , Patient's Chart, lab work & pertinent test results  History of Anesthesia Complications (+) PONV  Airway Mallampati: I       Dental   Pulmonary Current Smoker,    Pulmonary exam normal       Cardiovascular Normal cardiovascular exam    Neuro/Psych    GI/Hepatic   Endo/Other    Renal/GU      Musculoskeletal   Abdominal   Peds  Hematology   Anesthesia Other Findings   Reproductive/Obstetrics                             Anesthesia Physical Anesthesia Plan  ASA: I  Anesthesia Plan: General   Post-op Pain Management:    Induction: Intravenous  Airway Management Planned: Oral ETT  Additional Equipment:   Intra-op Plan:   Post-operative Plan: Extubation in OR  Informed Consent: I have reviewed the patients History and Physical, chart, labs and discussed the procedure including the risks, benefits and alternatives for the proposed anesthesia with the patient or authorized representative who has indicated his/her understanding and acceptance.     Plan Discussed with: CRNA, Anesthesiologist and Surgeon  Anesthesia Plan Comments:         Anesthesia Quick Evaluation

## 2014-12-05 NOTE — Anesthesia Postprocedure Evaluation (Signed)
  Anesthesia Post-op Note  Patient: Maureen Perez  Procedure(s) Performed: Procedure(s): LAPAROSCOPIC CHOLECYSTECTOMY WITH INTRAOPERATIVE CHOLANGIOGRAM (N/A)  Patient Location: PACU  Anesthesia Type:General  Level of Consciousness: awake, oriented, sedated and patient cooperative  Airway and Oxygen Therapy: Patient Spontanous Breathing  Post-op Pain: mild  Post-op Assessment: Post-op Vital signs reviewed, Patient's Cardiovascular Status Stable, Respiratory Function Stable, Patent Airway, No signs of Nausea or vomiting and Pain level controlled              Post-op Vital Signs: stable  Last Vitals:  Filed Vitals:   12/05/14 1224  BP:   Pulse: 50  Temp:   Resp: 18    Complications: No apparent anesthesia complications

## 2014-12-05 NOTE — Op Note (Signed)
12/04/2014 - 12/05/2014  11:46 AM  PATIENT:  Maureen Perez  29 y.o. female  PRE-OPERATIVE DIAGNOSIS:  cholecystitis  POST-OPERATIVE DIAGNOSIS:  cholecystitis  PROCEDURE:  Procedure(s): LAPAROSCOPIC CHOLECYSTECTOMY WITH INTRAOPERATIVE CHOLANGIOGRAM (N/A)  SURGEON:  Surgeon(s) and Role:    * Griselda Miner, MD - Primary  PHYSICIAN ASSISTANT:   ASSISTANTS: Magnus Ivan, RNFA   ANESTHESIA:   general  EBL:  Total I/O In: 1125 [I.V.:1125] Out: -   BLOOD ADMINISTERED:none  DRAINS: none   LOCAL MEDICATIONS USED:  MARCAINE     SPECIMEN:  Source of Specimen:  gallbladder  DISPOSITION OF SPECIMEN:  PATHOLOGY  COUNTS:  YES  TOURNIQUET:  * No tourniquets in log *  DICTATION: .Dragon Dictation   Procedure: After informed consent was obtained the patient was brought to the operating room and placed in the supine position on the operating room table. After adequate induction of general anesthesia the patient's abdomen was prepped with ChloraPrep allowed to dry and draped in usual sterile manner. The area below the umbilicus was infiltrated with quarter percent  Marcaine. A small incision was made with a 15 blade knife. The incision was carried down through the subcutaneous tissue bluntly with a hemostat and [employer] retractors. The linea alba was identified. The linea alba was incised with a 15 blade knife and each side was grasped with Coker clamps. The preperitoneal space was then probed with a hemostat until the peritoneum was opened and access was gained to the abdominal cavity. A 0 Vicryl pursestring stitch was placed in the fascia surrounding the opening. A Hassan cannula was then placed through the opening and anchored in place with the previously placed Vicryl purse string stitch. The abdomen was insufflated with carbon dioxide without difficulty. A laparoscope was inserted through the [facility] cannula in the right upper quadrant was inspected. Next the epigastric region was  infiltrated with % Marcaine. A small incision was made with a 15 blade knife. A 5 mm port was placed bluntly through this incision into the abdominal cavity under direct vision. Next 2 sites were chosen laterally on the right side of the abdomen for placement of 5 mm ports. Each of these areas was infiltrated with quarter percent Marcaine. Small stab incisions were made with a 15 blade knife. 5 mm ports were then placed bluntly through these incisions into the abdominal cavity under direct vision without difficulty. A blunt grasper was placed through the lateralmost 5 mm port and used to grasp the dome of the gallbladder and elevated anteriorly and superiorly. Another blunt grasper was placed through the other 5 mm port and used to retract the body and neck of the gallbladder. A dissector was placed through the epigastric port and using the electrocautery the peritoneal reflection at the gallbladder neck was opened. Blunt dissection was then carried out in this area until the gallbladder neck-cystic duct junction was readily identified and a good window was created. A single clip was placed on the gallbladder neck. A small  ductotomy was made just below the clip with laparoscopic scissors. A 14-gauge Angiocath was then placed through the anterior abdominal wall under direct vision. A Reddick cholangiogram catheter was then placed through the Angiocath and flushed. The catheter was then placed in the cystic duct and anchored in place with a clip. A cholangiogram was obtained that showed no filling defects good emptying into the duodenum an adequate length on the cystic duct. The anchoring clip and catheters were then removed from the patient. 3 clips  were placed proximally on the cystic duct and the duct was divided between the 2 sets of clips. Posterior to this the cystic artery was identified and again dissected bluntly in a circumferential manner until a good window  was created. 2 clips were placed proximally  and one distally on the artery and the artery was divided between the 2 sets of clips. Next a laparoscopic hook cautery device was used to separate the gallbladder from the liver bed. Prior to completely detaching the gallbladder from the liver bed the liver bed was inspected and several small bleeding points were coagulated with the electrocautery until the area was completely hemostatic. The gallbladder was then detached the rest of it from the liver bed without difficulty. A laparoscopic bag was inserted through the hassan port. The gallbladder was placed within the bag and the bag was sealed. A laparoscope was then moved to the epigastric port.  The bag with the gallbladder was then removed with the [facility] cannula through the infraumbilical port without difficulty. The fascial defect was then closed with the previously placed Vicryl pursestring stitch as well as with another figure-of-eight 0 Vicryl stitch. The liver bed was inspected again and found to be hemostatic. The abdomen was irrigated with copious amounts of saline until the effluent was clear. The ports were then removed under direct vision without difficulty and were found to be hemostatic. The gas was allowed to escape. The skin incisions were all closed with interrupted 4-0 Monocryl subcuticular stitches. Dermabond dressings were applied. The patient tolerated the procedure well. At the end of the case all needle sponge and instrument counts were correct. The patient was then awakened and taken to recovery in stable condition  PLAN OF CARE: Admit to inpatient   PATIENT DISPOSITION:  PACU - hemodynamically stable.   Delay start of Pharmacological VTE agent (>24hrs) due to surgical blood loss or risk of bleeding: no

## 2014-12-05 NOTE — Anesthesia Procedure Notes (Signed)
Procedure Name: Intubation Date/Time: 12/05/2014 10:28 AM Performed by: Ferol Luz L Pre-anesthesia Checklist: Emergency Drugs available, Patient identified, Suction available, Patient being monitored and Timeout performed Patient Re-evaluated:Patient Re-evaluated prior to inductionOxygen Delivery Method: Circle system utilized Preoxygenation: Pre-oxygenation with 100% oxygen Intubation Type: IV induction Ventilation: Mask ventilation without difficulty Laryngoscope Size: Mac and 3 Grade View: Grade I Tube size: 7.5 mm Number of attempts: 1 Airway Equipment and Method: Stylet Placement Confirmation: ETT inserted through vocal cords under direct vision,  breath sounds checked- equal and bilateral and positive ETCO2 Secured at: 19 cm Tube secured with: Tape Dental Injury: Teeth and Oropharynx as per pre-operative assessment

## 2014-12-05 NOTE — H&P (Signed)
Maureen Perez is an 29 y.o. female.   Chief Complaint: Abd pain  HPI: The patient is a 29 year old female with known history of gallstones. Patient states that after eating a hamburger helper the night prior to admission. She states that the pain continued throughout the day. This is accompanied with nausea and vomiting.  Upon evaluation ER patient underwent ultrasound scan and laboratory studies. Ultrasound revealed multiple gallstones with gallbladder wall thickening and pericholecystic fluid. Common bile duct measured up to 10 mm. There is no common duct stones seen. Laboratory studies reveal an elevated WBC with normal LFTs, normal lipase.  Past Medical History  Diagnosis Date  . Back pain   . Obesity   . Depression     no meds since pregancny  . Abnormal Pap smear     repeat WNL  . PONV (postoperative nausea and vomiting)   . Injury of tendon of left rotator cuff     Past Surgical History  Procedure Laterality Date  . Tubes in ears  age 89  . Tonsillectomy      age 89    Family History  Problem Relation Age of Onset  . Depression Mother   . Diabetes Mother   . Hypertension Mother   . COPD Mother   . Anesthesia problems Mother   . Depression Sister   . Diabetes Sister   . Kidney disease Sister   . Hypertension Sister   . Hypertension Father   . Anesthesia problems Father    Social History:  reports that she has been smoking Cigarettes.  She has a 1.5 pack-year smoking history. She has never used smokeless tobacco. She reports that she does not drink alcohol or use illicit drugs.  Allergies:  Allergies  Allergen Reactions  . Cephalexin Itching and Nausea And Vomiting  . Percocet [Oxycodone-Acetaminophen] Nausea And Vomiting     (Not in a hospital admission)  Results for orders placed or performed during the hospital encounter of 12/04/14 (from the past 48 hour(s))  CBC with Differential     Status: Abnormal   Collection Time: 12/04/14 11:00 PM  Result Value  Ref Range   WBC 11.7 (H) 4.0 - 10.5 K/uL   RBC 4.48 3.87 - 5.11 MIL/uL   Hemoglobin 13.4 12.0 - 15.0 g/dL   HCT 39.9 36.0 - 46.0 %   MCV 89.1 78.0 - 100.0 fL   MCH 29.9 26.0 - 34.0 pg   MCHC 33.6 30.0 - 36.0 g/dL   RDW 13.5 11.5 - 15.5 %   Platelets 265 150 - 400 K/uL   Neutrophils Relative % 65 43 - 77 %   Neutro Abs 7.6 1.7 - 7.7 K/uL   Lymphocytes Relative 27 12 - 46 %   Lymphs Abs 3.1 0.7 - 4.0 K/uL   Monocytes Relative 5 3 - 12 %   Monocytes Absolute 0.6 0.1 - 1.0 K/uL   Eosinophils Relative 3 0 - 5 %   Eosinophils Absolute 0.4 0.0 - 0.7 K/uL   Basophils Relative 0 0 - 1 %   Basophils Absolute 0.0 0.0 - 0.1 K/uL  Comprehensive metabolic panel     Status: None   Collection Time: 12/04/14 11:00 PM  Result Value Ref Range   Sodium 139 135 - 145 mmol/L   Potassium 3.5 3.5 - 5.1 mmol/L   Chloride 104 101 - 111 mmol/L   CO2 26 22 - 32 mmol/L   Glucose, Bld 95 65 - 99 mg/dL   BUN 10 6 -  20 mg/dL   Creatinine, Ser 0.83 0.44 - 1.00 mg/dL   Calcium 8.9 8.9 - 10.3 mg/dL   Total Protein 6.7 6.5 - 8.1 g/dL   Albumin 3.7 3.5 - 5.0 g/dL   AST 15 15 - 41 U/L   ALT 15 14 - 54 U/L   Alkaline Phosphatase 65 38 - 126 U/L   Total Bilirubin 0.4 0.3 - 1.2 mg/dL   GFR calc non Af Amer >60 >60 mL/min   GFR calc Af Amer >60 >60 mL/min    Comment: (NOTE) The eGFR has been calculated using the CKD EPI equation. This calculation has not been validated in all clinical situations. eGFR's persistently <60 mL/min signify possible Chronic Kidney Disease.    Anion gap 9 5 - 15  Lipase, blood     Status: None   Collection Time: 12/04/14 11:00 PM  Result Value Ref Range   Lipase 24 22 - 51 U/L  Urinalysis, Routine w reflex microscopic (not at [facility])     Status: Abnormal   Collection Time: 12/04/14 11:01 PM  Result Value Ref Range   Color, Urine YELLOW YELLOW   APPearance CLOUDY (A) CLEAR   Specific Gravity, Urine 1.027 1.005 - 1.030   pH 6.5 5.0 - 8.0   Glucose, UA NEGATIVE NEGATIVE mg/dL    Hgb urine dipstick NEGATIVE NEGATIVE   Bilirubin Urine NEGATIVE NEGATIVE   Ketones, ur NEGATIVE NEGATIVE mg/dL   Protein, ur NEGATIVE NEGATIVE mg/dL   Urobilinogen, UA 1.0 0.0 - 1.0 mg/dL   Nitrite NEGATIVE NEGATIVE   Leukocytes, UA TRACE (A) NEGATIVE  Urine microscopic-add on     Status: Abnormal   Collection Time: 12/04/14 11:01 PM  Result Value Ref Range   Squamous Epithelial / LPF MANY (A) RARE   WBC, UA 0-2 <3 WBC/hpf   Bacteria, UA FEW (A) RARE  POC Urine Pregnancy, ED  (If Pre-menopausal female)  not at [facility]     Status: None   Collection Time: 12/04/14 11:20 PM  Result Value Ref Range   Preg Test, Ur NEGATIVE NEGATIVE    Comment:        THE SENSITIVITY OF THIS METHODOLOGY IS >24 mIU/mL    US Abdomen Limited Ruq  12/05/2014   CLINICAL DATA:  Patient with right upper quadrant pain, nausea and vomiting.  EXAM: US ABDOMEN LIMITED - RIGHT UPPER QUADRANT  COMPARISON:  Ultrasound 08/24/2014  FINDINGS: Gallbladder:  Multiple gallstones are demonstrated within the gallbladder lumen. There is gallbladder wall thickening and suggestion of pericholecystic fluid. Positive sonographic Murphy's sign.  Common bile duct:  Diameter: Dilated measuring up to 10 mm.  Liver:  No focal lesion identified. Within normal limits in parenchymal echogenicity.  IMPRESSION: Findings most compatible with acute cholecystitis. There are multiple gallstones within the gallbladder lumen as well as gallbladder wall thickening, pericholecystic fluid and positive sonographic Murphy's sign.  Additionally there is dilatation of the common bile duct measuring up to 10 mm, raising the possibility of choledocholithiasis.   Electronically Signed   By: Lovey Newcomer [name]   On: 12/05/2014 01:08    Review of Systems  Constitutional: Negative for weight loss.  HENT: Negative for ear discharge, ear pain, hearing loss and tinnitus.   Eyes: Negative for blurred vision, double vision, photophobia and pain.  Respiratory: Negative for  cough, sputum production and shortness of breath.   Cardiovascular: Negative for chest pain.  Gastrointestinal: Positive for nausea, vomiting and abdominal pain.  Genitourinary: Negative for dysuria, urgency, frequency and flank  pain.  Musculoskeletal: Negative for myalgias, back pain, joint pain, falls and neck pain.  Neurological: Negative for dizziness, tingling, sensory change, focal weakness, loss of consciousness and headaches.  Endo/Heme/Allergies: Does not bruise/bleed easily.  Psychiatric/Behavioral: Negative for depression, memory loss and substance abuse. The patient is not nervous/anxious.     Blood pressure 102/61, pulse 54, temperature 98.6 F (37 C), temperature source Oral, resp. rate 16, height '5\' 1"'  (1.549 m), weight 69.854 kg (154 lb), last menstrual period 11/16/2014, SpO2 100 %, not currently breastfeeding. Physical Exam  Constitutional: She is oriented to person, place, and time. She appears well-developed and well-nourished.  HENT:  Head: Normocephalic and atraumatic.  Eyes: Conjunctivae and EOM are normal. Pupils are equal, round, and reactive to light.  Neck: Normal range of motion.  Cardiovascular: Normal rate, regular rhythm and normal heart sounds.   Respiratory: Effort normal and breath sounds normal.  GI: Soft. Bowel sounds are normal. She exhibits no distension. There is tenderness (RUQ). There is no rebound and no guarding.  Musculoskeletal: Normal range of motion.  Neurological: She is alert and oriented to person, place, and time.     Assessment/Plan 29 year old female with acute cholecystitis.  1. We'll admit the patient, nothing by mouth 2. IV antibiotics 3. I discussed the patient she will require lap cholecystectomy with intraoperative cholangiogram. I will discuss patient with Dr. Marlou Starks.  Rosario Jacks., Camala Talwar 12/05/2014, 3:08 AM

## 2014-12-05 NOTE — Transfer of Care (Signed)
Immediate Anesthesia Transfer of Care Note  Patient: Maureen Perez  Procedure(s) Performed: Procedure(s): LAPAROSCOPIC CHOLECYSTECTOMY WITH INTRAOPERATIVE CHOLANGIOGRAM (N/A)  Patient Location: PACU  Anesthesia Type:General  Level of Consciousness: awake, alert , oriented and patient cooperative  Airway & Oxygen Therapy: Patient Spontanous Breathing and Patient connected to nasal cannula oxygen  Post-op Assessment: Report given to RN, Post -op Vital signs reviewed and stable and Patient moving all extremities  Post vital signs: Reviewed and stable  Last Vitals:  Filed Vitals:   12/05/14 0401  BP: 93/52  Pulse: 57  Temp: 36.7 C  Resp: 16    Complications: No apparent anesthesia complications

## 2014-12-06 ENCOUNTER — Encounter (HOSPITAL_COMMUNITY): Payer: Self-pay | Admitting: General Surgery

## 2014-12-06 MED ORDER — HYDROCODONE-ACETAMINOPHEN 5-325 MG PO TABS: 1.0000 | ORAL_TABLET | Freq: Four times a day (QID) | ORAL | Status: DC | PRN

## 2014-12-06 MED ORDER — HYDROCODONE-ACETAMINOPHEN 5-325 MG PO TABS: 1.0000 | ORAL_TABLET | ORAL | Status: DC | PRN

## 2014-12-06 NOTE — Discharge Instructions (Signed)

## 2014-12-06 NOTE — Progress Notes (Signed)
Pt discharged to home.  Discharge instructions explained to pt.  Pt conveys understanding.  Pt has no questions at the time of discharge.  IV dc'd.

## 2014-12-06 NOTE — Discharge Summary (Signed)
Physician Discharge Summary  Patient ID: Maureen Perez MRN: [id number] DOB/AGE: 21-Dec-1985 29 y.o.  Admit date: 12/04/2014 Discharge date: 12/06/2014  Admitting Diagnosis: Acute cholecystitis   Discharge Diagnosis Patient Active Problem List   Diagnosis Date Noted  . Acute cholecystitis 12/05/2014  . RUQ pain 08/26/2014  . Chest pain 08/18/2013  . SGA (small for gestational age) 08/09/2013  . Fetus SGA (small for gestational age), antepartum 07/07/2013  . GERD (gastroesophageal reflux disease) 06/15/2013  . Smoking complicating pregnancy, antepartum 01/12/2013  . Rubella non-immune status, antepartum 12/24/2012  . Pap smear abnormality of cervix with LGSIL 10/07/2011  . Supervision of high-risk pregnancy 02/26/2011  . Lymphadenitis 09/11/2010  . DEPRESSION, MODERATE, RECURRENT 03/20/2010  . 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 INSOMNIA 01/06/2009  . TOBACCO ABUSE 11/22/2008  . ALLERGIC RHINITIS DUE TO ANIMAL HAIR AND DANDER 11/22/2008  . OBESITY 10/20/2008    Consultants none  Imaging: Dg Cholangiogram Operative  12/05/2014   CLINICAL DATA:  29 year old female with a history of cholecystitis, with intraoperative cholangiogram.  EXAM: INTRAOPERATIVE CHOLANGIOGRAM  TECHNIQUE: Cholangiographic images from the C-arm fluoroscopic device were submitted for interpretation post-operatively. Please see the procedural report for the amount of contrast and the fluoroscopy time utilized.  COMPARISON:  Ultrasound 12/05/2014, CT 08/24/2014  FINDINGS: Limited intraoperative fluoroscopic spot images during intraoperative cholangiogram.  Images demonstrate surgical instruments of the right upper quadrant.  Cannulation of the cystic duct/gallbladder neck with antegrade infusion of contrast. Common hepatic duct, cystic duct, common bile duct caliber within normal limits. No large filling defect identified.  Free flow of contrast across the ampulla.  IMPRESSION: Intraoperative  cholangiogram demonstrates unremarkable caliber of the common hepatic duct, common bile duct, cystic duct, with no large filling defect and free flow of contrast across the ampulla.  Please refer to the dictated operative report for full details of intraoperative findings and procedure.  Signed,  Yvone Neu. Loreta Ave, DO  [facility]  [facility]   Electronically Signed   By: Gilmer Mor D.O.   On: 12/05/2014 11:38   US Abdomen Limited Ruq  12/05/2014   CLINICAL DATA:  Patient with right upper quadrant pain, nausea and vomiting.  EXAM: US ABDOMEN LIMITED - RIGHT UPPER QUADRANT  COMPARISON:  Ultrasound 08/24/2014  FINDINGS: Gallbladder:  Multiple gallstones are demonstrated within the gallbladder lumen. There is gallbladder wall thickening and suggestion of pericholecystic fluid. Positive sonographic Murphy's sign.  Common bile duct:  Diameter: Dilated measuring up to 10 mm.  Liver:  No focal lesion identified. Within normal limits in parenchymal echogenicity.  IMPRESSION: Findings most compatible with acute cholecystitis. There are multiple gallstones within the gallbladder lumen as well as gallbladder wall thickening, pericholecystic fluid and positive sonographic Murphy's sign.  Additionally there is dilatation of the common bile duct measuring up to 10 mm, raising the possibility of choledocholithiasis.   Electronically Signed   By: Annia Belt [name]   On: 12/05/2014 01:08    Procedures Laparoscopic cholecystectomy with IOC---Dr. Bufford Lope Course:  Sissy Goetzke is a healthy 29 year old female who presented to [facility] with abdominal pain.  Workup showed acute cholecystitis.  Patient was admitted and underwent procedure listed above.  Tolerated procedure well and was transferred to the floor.  Diet was advanced as tolerated.  On POD#1, the patient was voiding well, tolerating diet, ambulating well, pain well controlled, vital signs stable, incisions c/d/i  and felt stable for discharge home. Medication risks, benefits and therapeutic alternatives were  reviewed with the patient.  She verbalizes understanding.  Patient will follow up in our office in 3 weeks and knows to call with questions or concerns.  Physical Exam: General:  Alert, NAD, pleasant, comfortable Abd:  Soft, ND, mild tenderness, incisions C/D/I    Medication List    TAKE these medications        acetaminophen 325 MG tablet  Commonly known as:  TYLENOL  Take 650 mg by mouth every 6 (six) hours as needed for mild pain.     cetirizine 10 MG tablet  Commonly known as:  ZYRTEC ALLERGY  Take 1 tablet (10 mg total) by mouth daily.     fluticasone 50 MCG/ACT nasal spray  Commonly known as:  FLONASE  Place 2 sprays into both nostrils daily.     HYDROcodone-acetaminophen 5-325 MG per tablet  Commonly known as:  NORCO/VICODIN  Take 1 tablet by mouth every 6 (six) hours as needed.     ondansetron 4 MG tablet  Commonly known as:  ZOFRAN  Take 1 tablet (4 mg total) by mouth every 6 (six) hours.             Follow-up Information    Schedule an appointment as soon as possible for a visit with Rubbie Battiest, MD.   Specialty:  Family Medicine   Contact information:   [address] [place] [address] [phone]       Follow up with [facility] On 12/27/2014.   Specialty:  General Surgery   Why:  arrive by 2:45PM for a 3:15PM post op check   Contact information:   [address] [place] [address] [phone]       Signed: Ashok Norris, [facility] [phone]  12/06/2014, 10:09 AM

## 2015-03-09 ENCOUNTER — Emergency Department (INDEPENDENT_AMBULATORY_CARE_PROVIDER_SITE_OTHER)
Admission: EM | Admit: 2015-03-09 | Discharge: 2015-03-09 | Disposition: A | Discharge status: Home / Self Care | Payer: Self-pay | Source: Home / Self Care | Attending: Family Medicine | Admitting: Family Medicine

## 2015-03-09 ENCOUNTER — Encounter (HOSPITAL_COMMUNITY): Payer: Self-pay | Admitting: Emergency Medicine

## 2015-03-09 DIAGNOSIS — J020 Streptococcal pharyngitis: Secondary | ICD-10-CM

## 2015-03-09 LAB — POCT RAPID STREP A: Streptococcus, Group A Screen (Direct): POSITIVE — AB

## 2015-03-09 MED ORDER — IPRATROPIUM BROMIDE 0.06 % NA SOLN: 2.0000 | Freq: Four times a day (QID) | NASAL | Status: DC

## 2015-03-09 MED ORDER — FLUCONAZOLE 150 MG PO TABS: 150.0000 mg | ORAL_TABLET | Freq: Every day | ORAL | Status: DC

## 2015-03-09 MED ORDER — AMOXICILLIN 500 MG PO CAPS
500.0000 mg | ORAL_CAPSULE | Freq: Two times a day (BID) | ORAL | Status: DC
Start: 2015-03-09 — End: 2015-07-12

## 2015-03-09 NOTE — ED Notes (Signed)
C/o cold sx States she has a stuffy nose, productive cough, chills, body ache and sore throat for three days otc meds taking as tx

## 2015-03-09 NOTE — Discharge Instructions (Signed)
You have developed strep throat. Please start the antibiotics as prescribed. Please use Motrin or ibuprofen 600 mg every 6 hours for pain. Please start the nasal Atrovent to help with your nasal congestion. Please continue the Mucinex but at the maximum dose. Please use the Diflucan if you develop a yeast infection.

## 2015-03-09 NOTE — ED Provider Notes (Signed)
CSN: [id number]     Arrival date & time 03/09/15  1617 History   First MD Initiated Contact with Patient 03/09/15 1653     Chief Complaint  Patient presents with  . URI   (Consider location/radiation/quality/duration/timing/severity/associated sxs/prior Treatment) HPI   3 days  Sore throat Associated w/ sinus congestion, body aches, chills Unsure if running a fever.  Constant Getting worse Tried mucinex w/ some improvement Co-worker recently Dx w/ strep throat\   Past Medical History  Diagnosis Date  . Back pain   . Obesity   . Depression     no meds since pregancny  . Abnormal Pap smear     repeat WNL  . PONV (postoperative nausea and vomiting)   . Injury of tendon of left rotator cuff   . Family history of adverse reaction to anesthesia     " MOTHER TAKES ALONG TIME TO WAKE "  . Cholelithiasis    Past Surgical History  Procedure Laterality Date  . Tubes in ears  age 43  . Tonsillectomy      age 28  . Cholecystectomy N/A 12/05/2014    Procedure: LAPAROSCOPIC CHOLECYSTECTOMY WITH INTRAOPERATIVE CHOLANGIOGRAM;  Surgeon: Chevis Pretty III, MD;  Location: [facility] OR;  Service: General;  Laterality: N/A;   Family History  Problem Relation Age of Onset  . Depression Mother   . Diabetes Mother   . Hypertension Mother   . COPD Mother   . Anesthesia problems Mother   . Depression Sister   . Diabetes Sister   . Kidney disease Sister   . Hypertension Sister   . Hypertension Father   . Anesthesia problems Father    Social History  Substance Use Topics  . Smoking status: Current Some Day Smoker -- 0.25 packs/day for 6 years    Types: Cigarettes  . Smokeless tobacco: Never Used  . Alcohol Use: No   OB History    Gravida Para Term Preterm AB TAB SAB Ectopic Multiple Living   0 1 0 1 0 0 2     Review of Systems Per HPI with all other pertinent systems negative.   Allergies  Cephalexin and Percocet  Home Medications   Prior to Admission medications    Medication Sig Start Date End Date Taking? Authorizing Provider  acetaminophen (TYLENOL) 325 MG tablet Take 650 mg by mouth every 6 (six) hours as needed for mild pain.    Historical Provider, MD  amoxicillin (AMOXIL) 500 MG capsule Take 1 capsule (500 mg total) by mouth 2 (two) times daily. 03/09/15   Ozella Rocks, MD  cetirizine (ZYRTEC ALLERGY) 10 MG tablet Take 1 tablet (10 mg total) by mouth daily. Patient not taking: Reported on 12/04/2014 07/19/14   Emilia Beck, PA-C  fluconazole (DIFLUCAN) 150 MG tablet Take 1 tablet (150 mg total) by mouth daily. Repeat dose in 3 days 03/09/15   Ozella Rocks, MD  fluticasone [facility]) 50 MCG/ACT nasal spray Place 2 sprays into both nostrils daily. Patient not taking: Reported on 08/24/2014 02/15/14   Fayrene Helper, PA-C  HYDROcodone-acetaminophen (NORCO/VICODIN) 5-325 MG per tablet Take 1 tablet by mouth every 6 (six) hours as needed. 12/06/14   Emina Riebock, NP  ipratropium (ATROVENT) 0.06 % nasal spray Place 2 sprays into both nostrils 4 (four) times daily. 03/09/15   Ozella Rocks, MD  ondansetron (ZOFRAN) 4 MG tablet Take 1 tablet (4 mg total) by mouth every 6 (six) hours. Patient not taking: Reported on 12/04/2014 11/08/14  Eyvonne Mechanic, PA-C   Meds Ordered and Administered this Visit  Medications - No data to display  BP 131/109 mmHg  Pulse 75  Temp(Src) 98.9 F (37.2 C) (Oral)  Resp 18  SpO2 100%  LMP 03/06/2015 No data found.   Physical Exam Physical Exam  Constitutional: oriented to person, place, and time. appears well-developed and well-nourished. No distress.  HENT:  Head: Normocephalic and atraumatic.  Eyes: EOMI. PERRL.  Nasal turbinates boggy Pharyngeal injection with minimal cobblestoning, tonsils 0-1+ without exudate Neck: Normal range of motion.  Cardiovascular: RRR, no m/r/g, 2+ distal pulses,  Pulmonary/Chest: Effort normal and breath sounds normal. No respiratory distress.  Abdominal: Soft. Bowel sounds are  normal. NonTTP, no distension.  Musculoskeletal: Normal range of motion. Non ttp, no effusion.  Neurological: alert and oriented to person, place, and time.  Skin: Skin is warm. No rash noted. non diaphoretic.  Psychiatric: normal mood and affect. behavior is normal. Judgment and thought content normal.   ED Course  Procedures (including critical care time)  Labs Review Labs Reviewed - No data to display  Imaging Review No results found.   Visual Acuity Review  Right Eye Distance:   Left Eye Distance:   Bilateral Distance:    Right Eye Near:   Left Eye Near:    Bilateral Near:         MDM   1. Strep throat    Rapid strep positive. Start amoxicillin, nasal Atrovent, Motrin. Continue ibuprofen. Diflucan if develops yeast infection.   Ozella Rocks, MD 03/09/15 [phone]

## 2015-06-28 ENCOUNTER — Emergency Department (HOSPITAL_COMMUNITY)
Admission: EM | Admit: 2015-06-28 | Discharge: 2015-06-28 | Disposition: A | Discharge status: Home / Self Care | Payer: Self-pay | Attending: Emergency Medicine | Admitting: Emergency Medicine

## 2015-06-28 DIAGNOSIS — Z3202 Encounter for pregnancy test, result negative: Secondary | ICD-10-CM | POA: Insufficient documentation

## 2015-06-28 DIAGNOSIS — Z8659 Personal history of other mental and behavioral disorders: Secondary | ICD-10-CM | POA: Insufficient documentation

## 2015-06-28 DIAGNOSIS — R1010 Upper abdominal pain, unspecified: Secondary | ICD-10-CM

## 2015-06-28 DIAGNOSIS — F17210 Nicotine dependence, cigarettes, uncomplicated: Secondary | ICD-10-CM | POA: Insufficient documentation

## 2015-06-28 DIAGNOSIS — Z79899 Other long term (current) drug therapy: Secondary | ICD-10-CM | POA: Insufficient documentation

## 2015-06-28 DIAGNOSIS — Z792 Long term (current) use of antibiotics: Secondary | ICD-10-CM | POA: Insufficient documentation

## 2015-06-28 DIAGNOSIS — N390 Urinary tract infection, site not specified: Secondary | ICD-10-CM | POA: Insufficient documentation

## 2015-06-28 DIAGNOSIS — E669 Obesity, unspecified: Secondary | ICD-10-CM | POA: Insufficient documentation

## 2015-06-28 LAB — COMPREHENSIVE METABOLIC PANEL
ALK PHOS: 76 U/L (ref 38–126)
ALT: 21 U/L (ref 14–54)
ANION GAP: 11 (ref 5–15)
AST: 21 U/L (ref 15–41)
Albumin: 4.3 g/dL (ref 3.5–5.0)
BILIRUBIN TOTAL: 0.9 mg/dL (ref 0.3–1.2)
BUN: 7 mg/dL (ref 6–20)
CALCIUM: 9.4 mg/dL (ref 8.9–10.3)
CO2: 24 mmol/L (ref 22–32)
CREATININE: 0.75 mg/dL (ref 0.44–1.00)
Chloride: 103 mmol/L (ref 101–111)
Glucose, Bld: 95 mg/dL (ref 65–99)
Potassium: 4.1 mmol/L (ref 3.5–5.1)
Sodium: 138 mmol/L (ref 135–145)
TOTAL PROTEIN: 7.5 g/dL (ref 6.5–8.1)

## 2015-06-28 LAB — CBC
HEMATOCRIT: 46.1 % — AB (ref 36.0–46.0)
HEMOGLOBIN: 15.2 g/dL — AB (ref 12.0–15.0)
MCH: 29.6 pg (ref 26.0–34.0)
MCHC: 33 g/dL (ref 30.0–36.0)
MCV: 89.7 fL (ref 78.0–100.0)
Platelets: 255 10*3/uL (ref 150–400)
RBC: 5.14 MIL/uL — AB (ref 3.87–5.11)
RDW: 13.4 % (ref 11.5–15.5)
WBC: 7.4 10*3/uL (ref 4.0–10.5)

## 2015-06-28 LAB — LIPASE, BLOOD: Lipase: 20 U/L (ref 11–51)

## 2015-06-28 LAB — I-STAT BETA HCG BLOOD, ED (MC, WL, AP ONLY): I-stat hCG, quantitative: <5 m[IU]/mL (ref <5)

## 2015-06-28 LAB — URINALYSIS, ROUTINE W REFLEX MICROSCOPIC
Glucose, UA: NEGATIVE mg/dL
Hgb urine dipstick: NEGATIVE
Ketones, ur: NEGATIVE mg/dL
Nitrite: NEGATIVE
Protein, ur: NEGATIVE mg/dL
SPECIFIC GRAVITY, URINE: 1.026 (ref 1.005–1.030)
pH: 5.5 (ref 5.0–8.0)

## 2015-06-28 LAB — URINE MICROSCOPIC-ADD ON

## 2015-06-28 MED ORDER — ONDANSETRON 4 MG PO TBDP: 4.0000 mg | ORAL_TABLET | Freq: Three times a day (TID) | ORAL | Status: DC | PRN

## 2015-06-28 MED ORDER — NITROFURANTOIN MONOHYD MACRO 100 MG PO CAPS: 100.0000 mg | ORAL_CAPSULE | Freq: Two times a day (BID) | ORAL | Status: DC

## 2015-06-28 NOTE — ED Provider Notes (Signed)
CSN: [id number]     Arrival date & time 06/28/15  1249 History   First MD Initiated Contact with Patient 06/28/15 1626     Chief Complaint  Patient presents with  . Abdominal Pain     (Consider location/radiation/quality/duration/timing/severity/associated sxs/prior Treatment) HPI Comments: Patient with history of cholecystectomy in 2016 presents with complaint of nausea, vomiting, diarrhea starting 4 days ago. Patient developed URI syndrome and cough after nausea and vomiting started. Nausea and vomiting improved until today. Patient complains of left upper abdominal cramping. No urinary symptoms. No chest pain or shortness of breath. Patient has not taken any treatments prior to arrival. She has been able to drink fluids. No fever. Onset of symptoms acute. Course is waxing and waning. Nothing makes symptoms better or worse. Denies heavy NSAID use. Denies alcohol use. No recent antibiotics.  Patient is a 30 y.o. female presenting with abdominal pain. The history is provided by the patient.  Abdominal Pain Associated symptoms: diarrhea, nausea and vomiting   Associated symptoms: no chest pain, no cough, no dysuria, no fever and no sore throat     Past Medical History  Diagnosis Date  . Back pain   . Obesity   . Depression     no meds since pregancny  . Abnormal Pap smear     repeat WNL  . PONV (postoperative nausea and vomiting)   . Injury of tendon of left rotator cuff   . Family history of adverse reaction to anesthesia     " MOTHER TAKES ALONG TIME TO WAKE "  . Cholelithiasis    Past Surgical History  Procedure Laterality Date  . Tubes in ears  age 89  . Tonsillectomy      age 30  . Cholecystectomy N/A 12/05/2014    Procedure: LAPAROSCOPIC CHOLECYSTECTOMY WITH INTRAOPERATIVE CHOLANGIOGRAM;  Surgeon: Chevis PrettyPaul Toth III, MD;  Location: [facility] OR;  Service: General;  Laterality: N/A;   Family History  Problem Relation Age of Onset  . Depression Mother   . Diabetes Mother   .  Hypertension Mother   . COPD Mother   . Anesthesia problems Mother   . Depression Sister   . Diabetes Sister   . Kidney disease Sister   . Hypertension Sister   . Hypertension Father   . Anesthesia problems Father    Social History  Substance Use Topics  . Smoking status: Current Some Day Smoker -- 0.25 packs/day for 6 years    Types: Cigarettes  . Smokeless tobacco: Never Used  . Alcohol Use: No   OB History    Gravida Para Term Preterm AB TAB SAB Ectopic Multiple Living   3 2 2  0 1 0 1 0 0 2     Review of Systems  Constitutional: Negative for fever.  HENT: Negative for rhinorrhea and sore throat.   Eyes: Negative for redness.  Respiratory: Negative for cough.   Cardiovascular: Negative for chest pain.  Gastrointestinal: Positive for nausea, vomiting, abdominal pain and diarrhea.  Genitourinary: Negative for dysuria.  Musculoskeletal: Negative for myalgias.  Skin: Negative for rash.  Neurological: Negative for headaches.    Allergies  Cephalexin and Percocet  Home Medications   Prior to Admission medications   Medication Sig Start Date End Date Taking? Authorizing Provider  acetaminophen (TYLENOL) 325 MG tablet Take 650 mg by mouth every 6 (six) hours as needed for mild pain.    Historical Provider, MD  amoxicillin (AMOXIL) 500 MG capsule Take 1 capsule (500 mg total) by mouth  2 (two) times daily. 03/09/15   Ozella Rocks, MD  cetirizine (ZYRTEC ALLERGY) 10 MG tablet Take 1 tablet (10 mg total) by mouth daily. Patient not taking: Reported on 12/04/2014 07/19/14   Emilia Beck, PA-C  fluconazole (DIFLUCAN) 150 MG tablet Take 1 tablet (150 mg total) by mouth daily. Repeat dose in 3 days 03/09/15   Ozella Rocks, MD  fluticasone [facility]) 50 MCG/ACT nasal spray Place 2 sprays into both nostrils daily. Patient not taking: Reported on 08/24/2014 02/15/14   Fayrene Helper, PA-C  HYDROcodone-acetaminophen (NORCO/VICODIN) 5-325 MG per tablet Take 1 tablet by mouth every 6 (six)  hours as needed. 12/06/14   Emina Riebock, NP  ipratropium (ATROVENT) 0.06 % nasal spray Place 2 sprays into both nostrils 4 (four) times daily. 03/09/15   Ozella Rocks, MD  ondansetron (ZOFRAN) 4 MG tablet Take 1 tablet (4 mg total) by mouth every 6 (six) hours. Patient not taking: Reported on 12/04/2014 11/08/14   Eyvonne Mechanic, PA-C   BP 121/84 mmHg  Pulse 90  Temp(Src) 97.9 F (36.6 C) (Oral)  Resp 16  SpO2 97%   Physical Exam  Constitutional: She appears well-developed and well-nourished.  HENT:  Head: Normocephalic and atraumatic.  Eyes: Conjunctivae are normal. Right eye exhibits no discharge. Left eye exhibits no discharge.  Neck: Normal range of motion. Neck supple.  Cardiovascular: Normal rate, regular rhythm and normal heart sounds.   Pulmonary/Chest: Effort normal and breath sounds normal.  Abdominal: Soft. Bowel sounds are normal. She exhibits no distension. There is tenderness (left upper quadrant tenderness without rebound or guarding.). There is no rebound and no guarding.  Neurological: She is alert.  Skin: Skin is warm and dry.  Psychiatric: She has a normal mood and affect.  Nursing note and vitals reviewed.   ED Course  Procedures (including critical care time) Labs Review Labs Reviewed  CBC - Abnormal; Notable for the following:    RBC 5.14 (*)    Hemoglobin 15.2 (*)    HCT 46.1 (*)    All other components within normal limits  URINALYSIS, ROUTINE W REFLEX MICROSCOPIC (NOT AT [facility]) - Abnormal; Notable for the following:    Color, Urine AMBER (*)    APPearance TURBID (*)    Bilirubin Urine SMALL (*)    Leukocytes, UA MODERATE (*)    All other components within normal limits  URINE MICROSCOPIC-ADD ON - Abnormal; Notable for the following:    Squamous Epithelial / LPF 6-30 (*)    Bacteria, UA FEW (*)    All other components within normal limits  LIPASE, BLOOD  COMPREHENSIVE METABOLIC PANEL  I-STAT BETA HCG BLOOD, ED ([facility], [facility], [facility] ONLY)    Imaging  Review No results found. I have personally reviewed and evaluated these images and lab results as part of my medical decision-making.   EKG Interpretation None       Patient seen and examined. Work-up reviewed. Will give PO challenge. Pending UA.   Vital signs reviewed and are as follows: BP 121/84 mmHg  Pulse 90  Temp(Src) 97.9 F (36.6 C) (Oral)  Resp 16  SpO2 97%  Patient tolerating oral fluids. Updated on the results. Will treat suggestion of UTI in UA.  7:33 PM The patient was urged to return to the Emergency Department immediately with worsening of current symptoms, worsening abdominal pain, persistent vomiting, blood noted in stools, fever, or any other concerns. The patient verbalized understanding.    MDM   Final diagnoses:  Pain  of upper abdomen  UTI (lower urinary tract infection)   Patient with symptoms consistent with viral gastroenteritis. Vitals are stable, no fever.  No signs of dehydration, tolerating PO's. Lungs are clear. No focal abdominal pain, no concern for appendicitis, cholecystitis, pancreatitis, ruptured viscus, UTI, kidney stone, or any other emergent abdominal etiology. Supportive therapy indicated with return if symptoms worsen. Patient counseled.     Renne Crigler, PA-C 06/28/15 1934  Doug Sou, MD 06/29/15 [phone]

## 2015-06-28 NOTE — ED Notes (Signed)
Pt left with all her belongings and ambulated out of the treatment area.  

## 2015-06-28 NOTE — ED Notes (Signed)
pty reports N/V/D for 4 days. Pt states that she has abdominal cramping as well.

## 2015-06-28 NOTE — Discharge Instructions (Signed)
Please read and follow all provided instructions.  Your diagnoses today include:  1. Pain of upper abdomen   2. UTI (lower urinary tract infection)     Tests performed today include:  Blood counts and electrolytes  Blood tests to check liver and kidney function  Blood tests to check pancreas function  Urine test to look for infection and pregnancy (in women)  Vital signs. See below for your results today.   Medications prescribed:   Zofran (ondansetron) - for nausea and vomiting   Macrobid - antibiotic for urinary tract infection  You have been prescribed an antibiotic medicine: take the entire course of medicine even if you are feeling better. Stopping early can cause the antibiotic not to work.  Take any prescribed medications only as directed.  Home care instructions:   Follow any educational materials contained in this packet.  Follow-up instructions: Please follow-up with your primary care provider in the next 2 days for further evaluation of your symptoms.    Return instructions:  SEEK IMMEDIATE MEDICAL ATTENTION IF:  The pain does not go away or becomes severe   A temperature above 101F develops   Repeated vomiting occurs (multiple episodes)   The pain becomes localized to portions of the abdomen. The right side could possibly be appendicitis. In an adult, the left lower portion of the abdomen could be colitis or diverticulitis.   Blood is being passed in stools or vomit (bright red or black tarry stools)   You develop chest pain, difficulty breathing, dizziness or fainting, or become confused, poorly responsive, or inconsolable (young children)  If you have any other emergent concerns regarding your health  Additional Information: Abdominal (belly) pain can be caused by many things. Your caregiver performed an examination and possibly ordered blood/urine tests and imaging (CT scan, x-rays, ultrasound). Many cases can be observed and treated at home after  initial evaluation in the emergency department. Even though you are being discharged home, abdominal pain can be unpredictable. Therefore, you need a repeated exam if your pain does not resolve, returns, or worsens. Most patients with abdominal pain don't have to be admitted to the hospital or have surgery, but serious problems like appendicitis and gallbladder attacks can start out as nonspecific pain. Many abdominal conditions cannot be diagnosed in one visit, so follow-up evaluations are very important.  Your vital signs today were: BP 121/84 mmHg   Pulse 90   Temp(Src) 97.9 F (36.6 C) (Oral)   Resp 16   SpO2 97% If your blood pressure (bp) was elevated above 135/85 this visit, please have this repeated by your doctor within one month. --------------

## 2015-06-29 ENCOUNTER — Telehealth: Payer: Self-pay | Admitting: Family Medicine

## 2015-06-29 MED ORDER — PROMETHAZINE HCL 12.5 MG PO TABS: 12.5000 mg | ORAL_TABLET | Freq: Three times a day (TID) | ORAL | Status: DC | PRN

## 2015-06-29 MED ORDER — SULFAMETHOXAZOLE-TRIMETHOPRIM 800-160 MG PO TABS: 1.0000 | ORAL_TABLET | Freq: Two times a day (BID) | ORAL | Status: DC

## 2015-06-29 NOTE — Telephone Encounter (Signed)
Patient here accompanying her mother for [facility]. She was seen in ED last night and treated for a UTI. Was given rx for macrobid and zofran but states these are too expensive for her. Wants to know if I can rx something cheaper. Patient appears systemically well. Allergies reviewed with patient. Pregnancy test negative yesterday in ED. Advised I will rx bactrim twice daily for 3 days and phenergan. If these medications do not help will need to be [facility] for another evaluation. Patient agreeable to this plan.  Latrelle DodrillBrittany J Tarique Loveall, MD

## 2015-07-12 ENCOUNTER — Emergency Department (INDEPENDENT_AMBULATORY_CARE_PROVIDER_SITE_OTHER)
Admission: EM | Admit: 2015-07-12 | Discharge: 2015-07-12 | Disposition: A | Discharge status: Home / Self Care | Payer: Self-pay | Source: Home / Self Care

## 2015-07-12 ENCOUNTER — Encounter (HOSPITAL_COMMUNITY): Payer: Self-pay | Admitting: Emergency Medicine

## 2015-07-12 DIAGNOSIS — H66001 Acute suppurative otitis media without spontaneous rupture of ear drum, right ear: Secondary | ICD-10-CM

## 2015-07-12 MED ORDER — CETIRIZINE HCL 10 MG PO TABS: 10.0000 mg | ORAL_TABLET | Freq: Every day | ORAL | Status: DC

## 2015-07-12 MED ORDER — AMOXICILLIN 500 MG PO CAPS: 1000.0000 mg | ORAL_CAPSULE | Freq: Two times a day (BID) | ORAL | Status: DC

## 2015-07-12 NOTE — ED Provider Notes (Signed)
CSN: [id number]     Arrival date & time 07/12/15  1946 History   None    No chief complaint on file. CC: right ear pain  (Consider location/radiation/quality/duration/timing/severity/associated sxs/prior Treatment) HPI  She is a 30 year old woman here for evaluation of right ear pain. She states for the last 3 days she has had nasal congestion, rhinorrhea, and pain in the right ear and jaw area. She denies any dental problems. No sore throat. She does report a mild cough. No shortness of breath. No fevers or chills. Pain in the ears described as sharp. It is worse with exposure to cold air.  Past Medical History  Diagnosis Date  . Back pain   . Obesity   . Depression     no meds since pregancny  . Abnormal Pap smear     repeat WNL  . PONV (postoperative nausea and vomiting)   . Injury of tendon of left rotator cuff   . Family history of adverse reaction to anesthesia     " MOTHER TAKES ALONG TIME TO WAKE "  . Cholelithiasis    Past Surgical History  Procedure Laterality Date  . Tubes in ears  age 45  . Tonsillectomy      age 48  . Cholecystectomy N/A 12/05/2014    Procedure: LAPAROSCOPIC CHOLECYSTECTOMY WITH INTRAOPERATIVE CHOLANGIOGRAM;  Surgeon: Chevis Pretty III, MD;  Location: [facility] OR;  Service: General;  Laterality: N/A;   Family History  Problem Relation Age of Onset  . Depression Mother   . Diabetes Mother   . Hypertension Mother   . COPD Mother   . Anesthesia problems Mother   . Depression Sister   . Diabetes Sister   . Kidney disease Sister   . Hypertension Sister   . Hypertension Father   . Anesthesia problems Father    Social History  Substance Use Topics  . Smoking status: Current Some Day Smoker -- 0.25 packs/day for 6 years    Types: Cigarettes  . Smokeless tobacco: Never Used  . Alcohol Use: No   OB History    Gravida Para Term Preterm AB TAB SAB Ectopic Multiple Living   0 1 0 1 0 0 2     Review of Systems As in history of present  illness Allergies  Cephalexin and Percocet  Home Medications   Prior to Admission medications   Medication Sig Start Date End Date Taking? Authorizing Provider  acetaminophen (TYLENOL) 325 MG tablet Take 650 mg by mouth every 6 (six) hours as needed for mild pain.    Historical Provider, MD  amoxicillin (AMOXIL) 500 MG capsule Take 2 capsules (1,000 mg total) by mouth 2 (two) times daily. 07/12/15   Charm Rings, MD  cetirizine (ZYRTEC) 10 MG tablet Take 1 tablet (10 mg total) by mouth daily. 07/12/15   Charm Rings, MD  HYDROcodone-acetaminophen (NORCO/VICODIN) 5-325 MG per tablet Take 1 tablet by mouth every 6 (six) hours as needed. 12/06/14   Emina Riebock, NP  ipratropium (ATROVENT) 0.06 % nasal spray Place 2 sprays into both nostrils 4 (four) times daily. 03/09/15   Ozella Rocks, MD  nitrofurantoin, macrocrystal-monohydrate, (MACROBID) 100 MG capsule Take 1 capsule (100 mg total) by mouth 2 (two) times daily. 06/28/15   Renne Crigler, PA-C  ondansetron (ZOFRAN ODT) 4 MG disintegrating tablet Take 1 tablet (4 mg total) by mouth every 8 (eight) hours as needed for nausea or vomiting. 06/28/15   Renne Crigler, PA-C  promethazine (PHENERGAN)  12.5 MG tablet Take 1 tablet (12.5 mg total) by mouth every 8 (eight) hours as needed for nausea or vomiting. 06/29/15   Latrelle Dodrill, MD  sulfamethoxazole-trimethoprim (BACTRIM DS) 800-160 MG tablet Take 1 tablet by mouth 2 (two) times daily. 06/29/15   Latrelle Dodrill, MD   Meds Ordered and Administered this Visit  Medications - No data to display  BP 119/78 mmHg  Pulse 73  Temp(Src) 97.9 F (36.6 C) (Oral)  Resp 17  SpO2 99% No data found.   Physical Exam  Constitutional: She is oriented to person, place, and time. She appears well-developed and well-nourished. No distress.  HENT:  Mouth/Throat: Oropharynx is clear and moist. No oropharyngeal exudate.  Nasal mucosa slightly erythematous. She has poor dentition, but no focal dental  tenderness or sign of abscess. Left TM is normal. Right TM has a purulent effusion.  Neck: Neck supple.  Cardiovascular: Normal rate, regular rhythm and normal heart sounds.   No murmur heard. Pulmonary/Chest: Effort normal and breath sounds normal. No respiratory distress. She has no wheezes. She has no rales.  Lymphadenopathy:    She has no cervical adenopathy.  Neurological: She is alert and oriented to person, place, and time.    ED Course  Procedures (including critical care time)  Labs Review Labs Reviewed - No data to display  Imaging Review No results found.     MDM   1. Acute suppurative otitis media of right ear without spontaneous rupture of tympanic membrane, recurrence not specified    Treat with amoxicillin. Recommended cetirizine to help with nasal symptoms. Follow-up as needed.    Charm Rings, MD 07/12/15 2124

## 2015-07-12 NOTE — Discharge Instructions (Signed)
You have an infection in your right ear. Take amoxicillin twice a day for 10 days. You can take Tylenol or ibuprofen as needed for pain. Take cetirizine daily to help with the congestion and drainage. Follow-up as needed.

## 2015-07-12 NOTE — ED Notes (Signed)
Sinus congestion and pain and ears hurt, pain for 3 days.

## 2015-07-17 IMAGING — DX DG CHEST 2V
2 series · 2 of 2 positions shown · non-contrast
Comparison: 08/16/2013

CLINICAL DATA: Upper respiratory infection symptoms for 2 days.
Cough and chest pain. Right ear pain.

EXAM:
CHEST  2 VIEW

[chest pa]
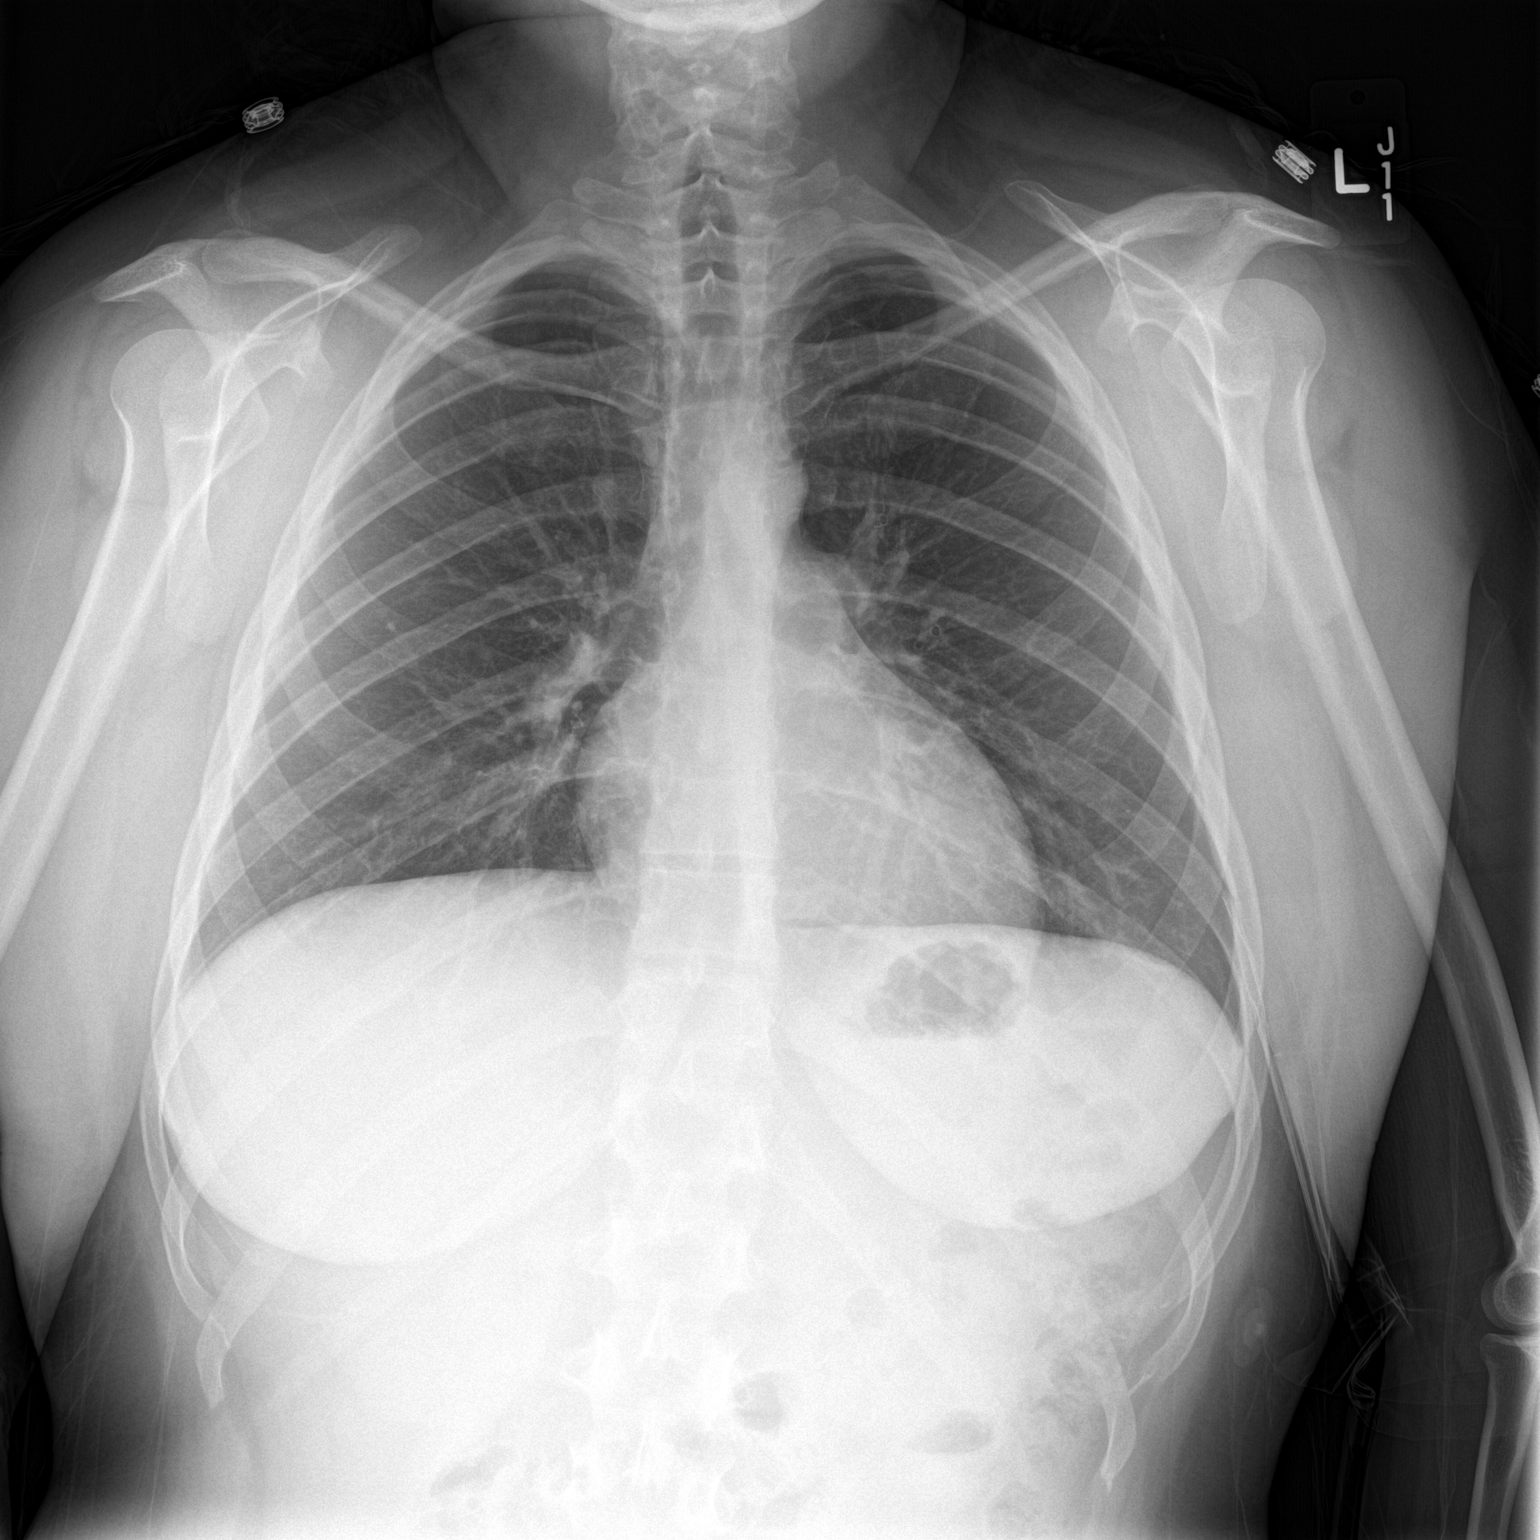

[chest lat]
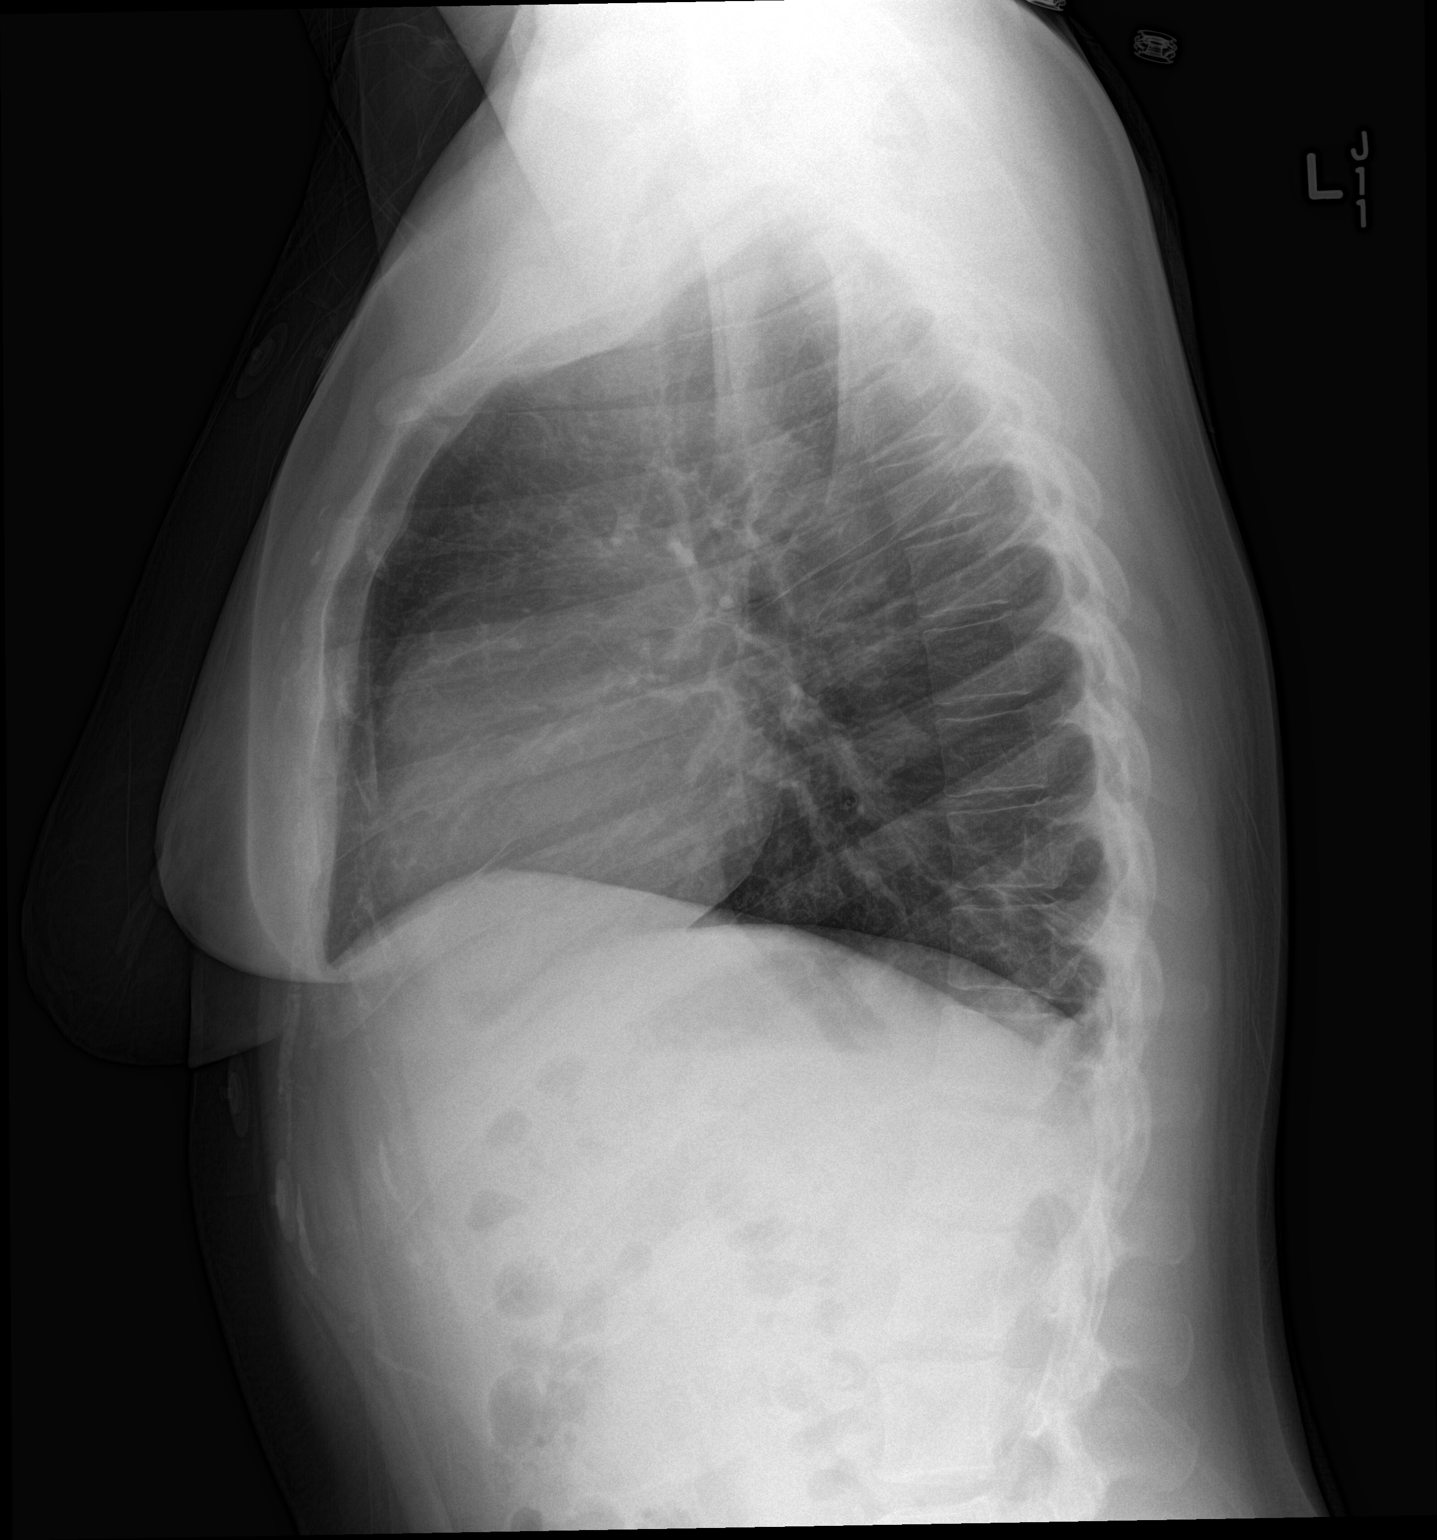

[2 of 2 positions shown; findings below may reference images not displayed]

FINDINGS: The heart size and mediastinal contours are within normal limits.
Both lungs are clear. The visualized skeletal structures are
unremarkable.
IMPRESSION: No active cardiopulmonary disease.

## 2015-09-01 ENCOUNTER — Inpatient Hospital Stay (HOSPITAL_COMMUNITY)
Admission: AD | Admit: 2015-09-01 | Discharge: 2015-09-01 | Disposition: A | Discharge status: Home / Self Care | Payer: Self-pay | Source: Ambulatory Visit | Attending: Obstetrics & Gynecology | Admitting: Obstetrics & Gynecology

## 2015-09-01 DIAGNOSIS — R635 Abnormal weight gain: Secondary | ICD-10-CM

## 2015-09-01 DIAGNOSIS — Z3202 Encounter for pregnancy test, result negative: Secondary | ICD-10-CM | POA: Insufficient documentation

## 2015-09-01 DIAGNOSIS — R110 Nausea: Secondary | ICD-10-CM | POA: Insufficient documentation

## 2015-09-01 LAB — URINALYSIS, ROUTINE W REFLEX MICROSCOPIC
BILIRUBIN URINE: NEGATIVE
Glucose, UA: NEGATIVE mg/dL
KETONES UR: 15 mg/dL — AB
NITRITE: NEGATIVE
PROTEIN: NEGATIVE mg/dL
pH: 5.5 (ref 5.0–8.0)

## 2015-09-01 LAB — POCT PREGNANCY, URINE: PREG TEST UR: NEGATIVE

## 2015-09-01 LAB — URINE MICROSCOPIC-ADD ON

## 2015-09-01 NOTE — MAU Note (Signed)
Last period was spotting LMP 08/17/15, having a lot of nausea and weight gain.

## 2015-09-01 NOTE — MAU Provider Note (Signed)
S:  Maureen Perez is a 30 y.o. [id number] who presents for nausea & weight gain per triage nurse.   Patient's last menstrual period was 08/17/2015. but was lighter than normal.  Per patient she has been somewhat nauseated & has had some weight gain so "needed a test". States she took a test at home but it "didn't show up". Patient denies vomiting, diarrhea, abdominal pain, or vaginal bleeding. Doesn't know how much weight she has gained just that her clothes fit tighter.  O:  Past Medical History  Diagnosis Date  . Back pain   . Obesity   . Depression     no meds since pregancny  . Abnormal Pap smear     repeat WNL  . PONV (postoperative nausea and vomiting)   . Injury of tendon of left rotator cuff   . Family history of adverse reaction to anesthesia     " MOTHER TAKES ALONG TIME TO WAKE "  . Cholelithiasis     Family History  Problem Relation Age of Onset  . Depression Mother   . Diabetes Mother   . Hypertension Mother   . COPD Mother   . Anesthesia problems Mother   . Depression Sister   . Diabetes Sister   . Kidney disease Sister   . Hypertension Sister   . Hypertension Father   . Anesthesia problems Father      Ceasar MonsFiled Vitals:   09/01/15 1529  BP: 143/85  Pulse: 76  Temp: 98.2 F (36.8 C)  Resp: 18   General:  A&OX3 with no signs of acute distress. She appears well-developed and well-nourished. No distress.  Neck: Normal range of motion.  Pulmonary/Chest: Effort normal. No respiratory distress.  Musculoskeletal: Normal range of motion.  Neurological: She is alert and oriented to person, place, and time.  Skin: Skin is warm and dry.   A: Negative pregnancy test Nausea  P: Discharge home States all she wanted was a pregnancy test If symptoms persist can f/u with PCP or [facility] as we don't do pregnancy test visits any longer  Judeth HornErin Maliaka Brasington, NP

## 2015-09-01 NOTE — Discharge Instructions (Signed)
Pregnancy Test Information °WHAT IS A PREGNANCY TEST? °A pregnancy test is used to detect the presence of human chorionic gonadotropin (hCG) in a sample of your urine or blood. hCG is a hormone produced by the cells of the placenta. The placenta is the organ that forms to nourish and support a developing baby. °This test requires a sample of either blood or urine. A pregnancy test determines whether you are pregnant or not. °HOW ARE PREGNANCY TESTS DONE? °Pregnancy tests are done using a home pregnancy test or having a blood or urine test done at your health care provider's office.  °Home pregnancy tests require a urine sample. °· Most kits use a plastic testing device with a strip of paper that indicates whether there is hCG in your urine. °· Follow the test instructions very carefully. °· After you urinate on the test stick, markings will appear to let you know whether you are pregnant. °· For best results, use your first urine of the morning. That is when the concentration of hCG is highest. °Having a blood test to check for pregnancy requires a sample of blood drawn from a vein in your hand or arm. Your health care provider will send your sample to a lab for testing. Results of a pregnancy test will be positive or negative. °IS ONE TYPE OF PREGNANCY TEST BETTER THAN ANOTHER? °In some cases, a blood test will return a positive result even if a urine test was negative because blood tests are more sensitive. This means blood tests can detect hCG earlier than home pregnancy tests.  °HOW ACCURATE ARE HOME PREGNANCY TESTS?  °Both types of pregnancy tests are very accurate. °· A blood test is about 98% accurate. °· When you are far enough along in your pregnancy and when used correctly, home pregnancy tests are equally accurate. °CAN ANYTHING INTERFERE WITH HOME PREGNANCY TEST RESULTS?  °It is possible for certain conditions to cause an inaccurate test result (false positive or false negative). °· A false positive is a  positive test result when you are not pregnant. This can happen if you: °¨ Are taking certain medicines, including anticonvulsants or tranquilizers. °¨ Have certain proteins in your blood. °· A false negative is a negative test result when you are pregnant. This can happen if you: °¨ Took the test before there was enough hCG to detect. A pregnancy test will not be positive in most women until 3-4 weeks after conception. °¨ Drank a lot of liquid before the test. Diluted urine samples can sometimes give an inaccurate result. °¨ Take certain medicines, such as water pills (diuretics) or some antihistamines. °WHAT SHOULD I DO IF I HAVE A POSITIVE PREGNANCY TEST? °If you have a positive pregnancy test, schedule an appointment with your health care provider. You might need additional testing to confirm the pregnancy. In the meantime, begin taking a prenatal vitamin, stop smoking, stop drinking alcohol, and do not use street drugs. °Talk to your health care provider about how to take care of yourself during your pregnancy. Ask about what to expect from the care you will need throughout pregnancy (prenatal care). °  °This information is not intended to replace advice given to you by your health care provider. Make sure you discuss any questions you have with your health care provider. °  °Document Released: 06/06/2003 Document Revised: 06/24/2014 Document Reviewed: 09/28/2013 °[employer]vier Interactive Patient Education ©2016 [employer] ° °

## 2015-09-25 ENCOUNTER — Encounter (HOSPITAL_COMMUNITY): Payer: Self-pay | Admitting: *Deleted

## 2015-09-25 ENCOUNTER — Inpatient Hospital Stay (HOSPITAL_COMMUNITY)
Admission: AD | Admit: 2015-09-25 | Discharge: 2015-09-25 | Disposition: A | Discharge status: Home / Self Care | Payer: Self-pay | Source: Ambulatory Visit | Attending: Family Medicine | Admitting: Family Medicine

## 2015-09-25 DIAGNOSIS — R112 Nausea with vomiting, unspecified: Secondary | ICD-10-CM

## 2015-09-25 DIAGNOSIS — R635 Abnormal weight gain: Secondary | ICD-10-CM | POA: Insufficient documentation

## 2015-09-25 DIAGNOSIS — F17210 Nicotine dependence, cigarettes, uncomplicated: Secondary | ICD-10-CM | POA: Insufficient documentation

## 2015-09-25 DIAGNOSIS — R42 Dizziness and giddiness: Secondary | ICD-10-CM | POA: Insufficient documentation

## 2015-09-25 DIAGNOSIS — Z79899 Other long term (current) drug therapy: Secondary | ICD-10-CM | POA: Insufficient documentation

## 2015-09-25 HISTORY — DX: Unspecified infectious disease: B99.9

## 2015-09-25 LAB — COMPREHENSIVE METABOLIC PANEL
ALK PHOS: 64 U/L (ref 38–126)
ALT: 16 U/L (ref 14–54)
ANION GAP: 6 (ref 5–15)
AST: 15 U/L (ref 15–41)
Albumin: 4.3 g/dL (ref 3.5–5.0)
BILIRUBIN TOTAL: 0.5 mg/dL (ref 0.3–1.2)
BUN: 9 mg/dL (ref 6–20)
CALCIUM: 9.1 mg/dL (ref 8.9–10.3)
CO2: 25 mmol/L (ref 22–32)
Chloride: 107 mmol/L (ref 101–111)
Creatinine, Ser: 0.78 mg/dL (ref 0.44–1.00)
GFR calc non Af Amer: >60 mL/min (ref >60)
Glucose, Bld: 105 mg/dL — ABNORMAL HIGH (ref 65–99)
Potassium: 4.3 mmol/L (ref 3.5–5.1)
SODIUM: 138 mmol/L (ref 135–145)
TOTAL PROTEIN: 6.9 g/dL (ref 6.5–8.1)

## 2015-09-25 LAB — CBC
HCT: 42.4 % (ref 36.0–46.0)
HEMOGLOBIN: 14.2 g/dL (ref 12.0–15.0)
MCH: 30.3 pg (ref 26.0–34.0)
MCHC: 33.5 g/dL (ref 30.0–36.0)
MCV: 90.4 fL (ref 78.0–100.0)
Platelets: 245 10*3/uL (ref 150–400)
RBC: 4.69 MIL/uL (ref 3.87–5.11)
RDW: 13.8 % (ref 11.5–15.5)
WBC: 10.6 10*3/uL — ABNORMAL HIGH (ref 4.0–10.5)

## 2015-09-25 LAB — URINE MICROSCOPIC-ADD ON

## 2015-09-25 LAB — URINALYSIS, ROUTINE W REFLEX MICROSCOPIC
BILIRUBIN URINE: NEGATIVE
GLUCOSE, UA: NEGATIVE mg/dL
Ketones, ur: NEGATIVE mg/dL
Nitrite: NEGATIVE
PH: 5 (ref 5.0–8.0)
Protein, ur: NEGATIVE mg/dL

## 2015-09-25 LAB — POCT PREGNANCY, URINE: PREG TEST UR: NEGATIVE

## 2015-09-25 LAB — HCG, SERUM, QUALITATIVE: Preg, Serum: NEGATIVE

## 2015-09-25 MED ORDER — PROMETHAZINE HCL 25 MG PO TABS: 12.5000 mg | ORAL_TABLET | Freq: Four times a day (QID) | ORAL | Status: DC | PRN

## 2015-09-25 NOTE — MAU Provider Note (Signed)
Chief Complaint: Dizziness and Nausea   First Provider Initiated Contact with Patient 09/25/15 1800      SUBJECTIVE HPI: Maureen Perez is a 30 y.o. [id number] who presents to maternity admissions reporting nausea with vomiting 2-3 x /day and dizziness with onset 1 week ago. She reports similar symptoms 3 weeks ago. She also reports weight gain in last month. She is concerned she may be pregnant but home tests have been negative.  She reports eating and drinking regularly. She does report sick contacts with her two teen nieces reporting GI symptoms this week.  She has not taken any medication or tried any treatments for her symptoms. They are unchanged, not worsening or improving since onset. She denies abdominal pain, vaginal bleeding, vaginal itching/burning, urinary symptoms, h/a, dizziness, n/v, or fever/chills.     HPI  Past Medical History  Diagnosis Date  . Back pain   . Obesity   . Depression     no meds since pregancny  . Abnormal Pap smear     repeat WNL  . PONV (postoperative nausea and vomiting)   . Injury of tendon of left rotator cuff   . Family history of adverse reaction to anesthesia     " MOTHER TAKES ALONG TIME TO WAKE "  . Cholelithiasis   . Infection     UTI   Past Surgical History  Procedure Laterality Date  . Tubes in ears  age 68  . Tonsillectomy      age 53  . Cholecystectomy N/A 12/05/2014    Procedure: LAPAROSCOPIC CHOLECYSTECTOMY WITH INTRAOPERATIVE CHOLANGIOGRAM;  Surgeon: Chevis Pretty III, MD;  Location: [facility] OR;  Service: General;  Laterality: N/A;   Social History   Social History  . Marital Status: Single    Spouse Name: N/A  . Number of Children: N/A  . Years of Education: N/A   Occupational History  . Not on file.   Social History Main Topics  . Smoking status: Current Some Day Smoker -- 0.50 packs/day for 10 years    Types: Cigarettes  . Smokeless tobacco: Never Used  . Alcohol Use: No  . Drug Use: No  . Sexual Activity: Yes    Birth  Control/ Protection: Condom   Other Topics Concern  . Not on file   Social History Narrative   No current facility-administered medications on file prior to encounter.   Current Outpatient Prescriptions on File Prior to Encounter  Medication Sig Dispense Refill  . acetaminophen (TYLENOL) 325 MG tablet Take 650 mg by mouth every 6 (six) hours as needed for mild pain.    . cetirizine (ZYRTEC) 10 MG tablet Take 1 tablet (10 mg total) by mouth daily. 30 tablet 0  . amoxicillin (AMOXIL) 500 MG capsule Take 2 capsules (1,000 mg total) by mouth 2 (two) times daily. (Patient not taking: Reported on 09/25/2015) 40 capsule 0  . nitrofurantoin, macrocrystal-monohydrate, (MACROBID) 100 MG capsule Take 1 capsule (100 mg total) by mouth 2 (two) times daily. (Patient not taking: Reported on 09/25/2015) 10 capsule 0  . ondansetron (ZOFRAN ODT) 4 MG disintegrating tablet Take 1 tablet (4 mg total) by mouth every 8 (eight) hours as needed for nausea or vomiting. (Patient not taking: Reported on 09/25/2015) 10 tablet 0  . promethazine (PHENERGAN) 12.5 MG tablet Take 1 tablet (12.5 mg total) by mouth every 8 (eight) hours as needed for nausea or vomiting. (Patient not taking: Reported on 09/25/2015) 10 tablet 0  . sulfamethoxazole-trimethoprim (BACTRIM DS) 800-160 MG tablet  Take 1 tablet by mouth 2 (two) times daily. (Patient not taking: Reported on 09/25/2015) 6 tablet 0   Allergies  Allergen Reactions  . Cephalexin Itching and Nausea And Vomiting  . Percocet [Oxycodone-Acetaminophen] Nausea And Vomiting    ROS:  Review of Systems  Constitutional: Positive for unexpected weight change. Negative for fever, chills and fatigue.  Respiratory: Negative for shortness of breath.   Cardiovascular: Negative for chest pain.  Gastrointestinal: Positive for nausea and vomiting. Negative for diarrhea.  Genitourinary: Negative for dysuria, flank pain, vaginal bleeding, vaginal discharge, difficulty urinating, vaginal pain  and pelvic pain.  Neurological: Negative for dizziness and headaches.  Psychiatric/Behavioral: Negative.      I have reviewed patient's Past Medical Hx, Surgical Hx, Family Hx, Social Hx, medications and allergies.   Physical Exam   Patient Vitals for the past 24 hrs:  BP Temp Temp src Pulse Resp Height Weight  09/25/15 1848 121/70 mmHg - - 67 16 - -  09/25/15 1533 128/76 mmHg 98.4 F (36.9 C) Oral 76 16 5' 1.25" (1.556 m) 163 lb 3.2 oz (74.027 kg)   Constitutional: Well-developed, well-nourished female in no acute distress.  Cardiovascular: normal rate Respiratory: normal effort GI: Abd soft, non-tender. Pos BS x 4 MS: Extremities nontender, no edema, normal ROM Neurologic: Alert and oriented x 4.  GU: Neg CVAT.  PELVIC EXAM: Deferred  LAB RESULTS Results for orders placed or performed during the hospital encounter of 09/25/15 (from the past 24 hour(s))  Urinalysis, Routine w reflex microscopic (not at [facility])     Status: Abnormal   Collection Time: 09/25/15  3:40 PM  Result Value Ref Range   Color, Urine YELLOW YELLOW   APPearance CLEAR CLEAR   Specific Gravity, Urine >1.030 (H) 1.005 - 1.030   pH 5.0 5.0 - 8.0   Glucose, UA NEGATIVE NEGATIVE mg/dL   Hgb urine dipstick TRACE (A) NEGATIVE   Bilirubin Urine NEGATIVE NEGATIVE   Ketones, ur NEGATIVE NEGATIVE mg/dL   Protein, ur NEGATIVE NEGATIVE mg/dL   Nitrite NEGATIVE NEGATIVE   Leukocytes, UA MODERATE (A) NEGATIVE  Urine microscopic-add on     Status: Abnormal   Collection Time: 09/25/15  3:40 PM  Result Value Ref Range   Squamous Epithelial / LPF 6-30 (A) NONE SEEN   WBC, UA 6-30 0 - 5 WBC/hpf   RBC / HPF 0-5 0 - 5 RBC/hpf   Bacteria, UA FEW (A) NONE SEEN   Urine-Other YEAST PRESENT   Pregnancy, urine POC     Status: None   Collection Time: 09/25/15  3:42 PM  Result Value Ref Range   Preg Test, Ur NEGATIVE NEGATIVE  CBC     Status: Abnormal   Collection Time: 09/25/15  6:06 PM  Result Value Ref Range   WBC  10.6 (H) 4.0 - 10.5 K/uL   RBC 4.69 3.87 - 5.11 MIL/uL   Hemoglobin 14.2 12.0 - 15.0 g/dL   HCT 16.1 09.6 - 04.5 %   MCV 90.4 78.0 - 100.0 fL   MCH 30.3 26.0 - 34.0 pg   MCHC 33.5 30.0 - 36.0 g/dL   RDW 40.9 81.1 - 91.4 %   Platelets 245 150 - 400 K/uL  Comprehensive metabolic panel     Status: Abnormal   Collection Time: 09/25/15  6:06 PM  Result Value Ref Range   Sodium 138 135 - 145 mmol/L   Potassium 4.3 3.5 - 5.1 mmol/L   Chloride 107 101 - 111 mmol/L   CO2 25  22 - 32 mmol/L   Glucose, Bld 105 (H) 65 - 99 mg/dL   BUN 9 6 - 20 mg/dL   Creatinine, Ser 4.090.78 0.44 - 1.00 mg/dL   Calcium 9.1 8.9 - 81.110.3 mg/dL   Total Protein 6.9 6.5 - 8.1 g/dL   Albumin 4.3 3.5 - 5.0 g/dL   AST 15 15 - 41 U/L   ALT 16 14 - 54 U/L   Alkaline Phosphatase 64 38 - 126 U/L   Total Bilirubin 0.5 0.3 - 1.2 mg/dL   GFR calc non Af Amer >60 >60 mL/min   GFR calc Af Amer >60 >60 mL/min   Anion gap 6 5 - 15  hCG, serum, qualitative     Status: None   Collection Time: 09/25/15  6:06 PM  Result Value Ref Range   Preg, Serum NEGATIVE NEGATIVE       IMAGING No results found.  MAU Management/MDM: Ordered labs and reviewed results.  Pregnancy test negative today, urine and serum.  Will treat nausea with Phenergan 12.5-25 mg PO Q 6 hours Rx sent to pharmacy.  Pt to f/u with PCP if symptoms persist.  Pt stable at time of discharge.  ASSESSMENT 1. Nausea and vomiting, intractability of vomiting not specified, unspecified vomiting type   2. Dizziness     PLAN Discharge home   Medication List    STOP taking these medications        amoxicillin 500 MG capsule  Commonly known as:  AMOXIL     nitrofurantoin (macrocrystal-monohydrate) 100 MG capsule  Commonly known as:  MACROBID     ondansetron 4 MG disintegrating tablet  Commonly known as:  ZOFRAN ODT     sulfamethoxazole-trimethoprim 800-160 MG tablet  Commonly known as:  BACTRIM DS      TAKE these medications        acetaminophen 325 MG  tablet  Commonly known as:  TYLENOL  Take 650 mg by mouth every 6 (six) hours as needed for mild pain.     cetirizine 10 MG tablet  Commonly known as:  ZYRTEC  Take 1 tablet (10 mg total) by mouth daily.     promethazine 25 MG tablet  Commonly known as:  PHENERGAN  Take 0.5-1 tablets (12.5-25 mg total) by mouth every 6 (six) hours as needed for nausea.       Follow-up Information    Schedule an appointment as soon as possible for a visit with Levert FeinsteinBrittany McIntyre, MD.   Specialty:  Family Medicine   Why:  Return to MAU as needed for emergencies   Contact information:   [address] [place] [address] [phone]       Sharen CounterLisa Leftwich-Kirby Certified Nurse-Midwife 09/25/2015  7:15 PM

## 2015-09-25 NOTE — Discharge Instructions (Signed)
Dizziness °Dizziness is a common problem. It is a feeling of unsteadiness or light-headedness. You may feel like you are about to faint. Dizziness can lead to injury if you stumble or fall. Anyone can become dizzy, but dizziness is more common in older adults. This condition can be caused by a number of things, including medicines, dehydration, or illness. °HOME CARE INSTRUCTIONS °Taking these steps may help with your condition: °Eating and Drinking °· Drink enough fluid to keep your urine clear or pale yellow. This helps to keep you from becoming dehydrated. Try to drink more clear fluids, such as water. °· Do not drink alcohol. °· Limit your caffeine intake if directed by your health care provider. °· Limit your salt intake if directed by your health care provider. °Activity °· Avoid making quick movements. °· Rise slowly from chairs and steady yourself until you feel okay. °· In the morning, first sit up on the side of the bed. When you feel okay, stand slowly while you hold onto something until you know that your balance is fine. °· Move your legs often if you need to stand in one place for a long time. Tighten and relax your muscles in your legs while you are standing. °· Do not drive or operate heavy machinery if you feel dizzy. °· Avoid bending down if you feel dizzy. Place items in your home so that they are easy for you to reach without leaning over. °Lifestyle °· Do not use any tobacco products, including cigarettes, chewing tobacco, or electronic cigarettes. If you need help quitting, ask your health care provider. °· Try to reduce your stress level, such as with yoga or meditation. Talk with your health care provider if you need help. °General Instructions °· Watch your dizziness for any changes. °· Take medicines only as directed by your health care provider. Talk with your health care provider if you think that your dizziness is caused by a medicine that you are taking. °· Tell a friend or a family  member that you are feeling dizzy. If he or she notices any changes in your behavior, have this person call your health care provider. °· Keep all follow-up visits as directed by your health care provider. This is important. °SEEK MEDICAL CARE IF: °· Your dizziness does not go away. °· Your dizziness or light-headedness gets worse. °· You feel nauseous. °· You have reduced hearing. °· You have new symptoms. °· You are unsteady on your feet or you feel like the room is spinning. °SEEK IMMEDIATE MEDICAL CARE IF: °· You vomit or have diarrhea and are unable to eat or drink anything. °· You have problems talking, walking, swallowing, or using your arms, hands, or legs. °· You feel generally weak. °· You are not thinking clearly or you have trouble forming sentences. It may take a friend or family member to notice this. °· You have chest pain, abdominal pain, shortness of breath, or sweating. °· Your vision changes. °· You notice any bleeding. °· You have a headache. °· You have neck pain or a stiff neck. °· You have a fever. °  °This information is not intended to replace advice given to you by your health care provider. Make sure you discuss any questions you have with your health care provider. °  °Document Released: 11/27/2000 Document Revised: 10/18/2014 Document Reviewed: 05/30/2014 °Elsevier Interactive Patient Education ©2016 [employer] ° °Nausea and Vomiting °Nausea is a sick feeling that often comes before throwing up (vomiting). Vomiting is a   reflex where stomach contents come out of your mouth. Vomiting can cause severe loss of body fluids (dehydration). Children and elderly adults can become dehydrated quickly, especially if they also have diarrhea. Nausea and vomiting are symptoms of a condition or disease. It is important to find the cause of your symptoms. °CAUSES  °· Direct irritation of the stomach lining. This irritation can result from increased acid production (gastroesophageal reflux disease),  infection, food poisoning, taking certain medicines (such as nonsteroidal anti-inflammatory drugs), alcohol use, or tobacco use. °· Signals from the brain. These signals could be caused by a headache, heat exposure, an inner ear disturbance, increased pressure in the brain from injury, infection, a tumor, or a concussion, pain, emotional stimulus, or metabolic problems. °· An obstruction in the gastrointestinal tract (bowel obstruction). °· Illnesses such as diabetes, hepatitis, gallbladder problems, appendicitis, kidney problems, cancer, sepsis, atypical symptoms of a heart attack, or eating disorders. °· Medical treatments such as chemotherapy and radiation. °· Receiving medicine that makes you sleep (general anesthetic) during surgery. °DIAGNOSIS °Your caregiver may ask for tests to be done if the problems do not improve after a few days. Tests may also be done if symptoms are severe or if the reason for the nausea and vomiting is not clear. Tests may include: °· Urine tests. °· Blood tests. °· Stool tests. °· Cultures (to look for evidence of infection). °· X-rays or other imaging studies. °Test results can help your caregiver make decisions about treatment or the need for additional tests. °TREATMENT °You need to stay well hydrated. Drink frequently but in small amounts. You may wish to drink water, sports drinks, clear broth, or eat frozen ice pops or gelatin dessert to help stay hydrated. When you eat, eating slowly may help prevent nausea. There are also some antinausea medicines that may help prevent nausea. °HOME CARE INSTRUCTIONS  °· Take all medicine as directed by your caregiver. °· If you do not have an appetite, do not force yourself to eat. However, you must continue to drink fluids. °· If you have an appetite, eat a normal diet unless your caregiver tells you differently. °¨ Eat a variety of complex carbohydrates (rice, wheat, potatoes, bread), lean meats, yogurt, fruits, and vegetables. °¨ Avoid  high-fat foods because they are more difficult to digest. °· Drink enough water and fluids to keep your urine clear or pale yellow. °· If you are dehydrated, ask your caregiver for specific rehydration instructions. Signs of dehydration may include: °¨ Severe thirst. °¨ Dry lips and mouth. °¨ Dizziness. °¨ Dark urine. °¨ Decreasing urine frequency and amount. °¨ Confusion. °¨ Rapid breathing or pulse. °SEEK IMMEDIATE MEDICAL CARE IF:  °· You have blood or brown flecks (like coffee grounds) in your vomit. °· You have black or bloody stools. °· You have a severe headache or stiff neck. °· You are confused. °· You have severe abdominal pain. °· You have chest pain or trouble breathing. °· You do not urinate at least once every 8 hours. °· You develop cold or clammy skin. °· You continue to vomit for longer than 24 to 48 hours. °· You have a fever. °MAKE SURE YOU:  °· Understand these instructions. °· Will watch your condition. °· Will get help right away if you are not doing well or get worse. °  °This information is not intended to replace advice given to you by your health care provider. Make sure you discuss any questions you have with your health care provider. °  °Document Released: 06/03/2005 Document   Revised: 08/26/2011 Document Reviewed: 10/31/2010 °Elsevier Interactive Patient Education ©2016 [employer] Inc[employer] ° °

## 2015-09-25 NOTE — MAU Provider Note (Signed)
History     CSN: [id number]  Arrival date and time: 09/25/15 1512   First Provider Initiated Contact with Patient 09/25/15 1800      Chief Complaint  Patient presents with  . Dizziness  . Nausea   HPI  Florentina AddisonMelissa Avedisian is a 30 year old [id number] presenting today with a one week history of nausea and dizziness. She has been vomiting twice a day for the past week and reports feeling lightheaded upon standing. She experienced similar symptoms when she was pregnant with her previous children. She has not taken any medication for her symptoms. She also endorses significant weight gain though does not know the exact amount, estimates she has gone up 2 pants sizes in the past three weeks. Patient visited MAU on 3/17 with similar symptoms of nausea and weight gain but no vomiting. She was concerned about pregnancy as her LMP was light spotting on 3/2. She had a negative urine pregnancy test in the MAU and has since taken several tests at home that have all been negative. She denies diarrhea, abdominal pain, vaginal bleeding, or shortness of breath.    Past Medical History  Diagnosis Date  . Back pain   . Obesity   . Depression     no meds since pregancny  . Abnormal Pap smear     repeat WNL  . PONV (postoperative nausea and vomiting)   . Injury of tendon of left rotator cuff   . Family history of adverse reaction to anesthesia     " MOTHER TAKES ALONG TIME TO WAKE "  . Cholelithiasis             Past Surgical History  Procedure Laterality Date  . Tubes in ears  age 89  . Tonsillectomy      age 30  . Cholecystectomy N/A 12/05/2014    Procedure: LAPAROSCOPIC CHOLECYSTECTOMY WITH INTRAOPERATIVE CHOLANGIOGRAM;  Surgeon: Chevis PrettyPaul Toth III, MD;  Location: [facility] OR;  Service: General;  Laterality: N/A;    Family History  Problem Relation Age of Onset  . Depression Mother   . Diabetes Mother   . Hypertension Mother   . COPD Mother   . Anesthesia problems Mother   . Depression Sister   .  Diabetes Sister   . Kidney disease Sister   . Hypertension Sister   . Hypertension Father   . Anesthesia problems Father   . COPD Father     Social History  Substance Use Topics  . Smoking status: Current Some Day Smoker -- 0.50 packs/day for 10 years    Types: Cigarettes  . Smokeless tobacco: Never Used  . Alcohol Use: No    Allergies:  Allergies  Allergen Reactions  . Cephalexin Itching and Nausea And Vomiting  . Percocet [Oxycodone-Acetaminophen] Nausea And Vomiting    Prescriptions prior to admission  Medication Sig Dispense Refill Last Dose  . acetaminophen (TYLENOL) 325 MG tablet Take 650 mg by mouth every 6 (six) hours as needed for mild pain.   Past Month at Unknown time   Review of Systems  Constitutional: Negative for fever.       Weight gain   Cardiovascular: Negative for chest pain.  Gastrointestinal: Positive for nausea and vomiting. Negative for abdominal pain and diarrhea.  Genitourinary: Negative for dysuria.  Musculoskeletal: Negative for myalgias.  Skin: Negative for rash.  Neurological: Positive for dizziness and headaches.   Physical Exam   Blood pressure 128/76, pulse 76, temperature 98.4 F (36.9 C), temperature source Oral, resp.  rate 16, height 5' 1.25" (1.556 m), weight 74.027 kg (163 lb 3.2 oz), last menstrual period 09/07/2015.  Physical Exam  Constitutional: She is oriented to person, place, and time. She appears well-developed and well-nourished. No distress.  HENT:  Head: Normocephalic and atraumatic.  Eyes: Conjunctivae are normal. Pupils are equal, round, and reactive to light.  Neck: Normal range of motion.  Cardiovascular: Normal rate, regular rhythm and intact distal pulses.   No murmur heard. Respiratory: Effort normal and breath sounds normal.  GI: Soft. She exhibits no distension and no mass. There is no tenderness.  Hypoactive bowel sounds  Musculoskeletal: She exhibits no edema or tenderness.  Neurological: She is alert and  oriented to person, place, and time. No cranial nerve deficit.  Skin: No rash noted. No erythema. No pallor.    MAU Course  Procedures None  Labs: Urine pregnancy test negative Serum hCG negative CBC, CMP unremarkable  UA significant for trace Hgb and moderate leukocytes Urine Microscopy positive for yeast, few bacteria   Assessment and Plan  Moon Budde is a 30 year old [id number] presenting today with vomiting and lightheadedness with concern for pregnancy. Serum and urine hCG were both negative and her other lab work showed no abnormalities. Likely infectious, could be obstructive but normal Hgb and lack of abdominal pain reassuring. Patient should see PCP if vomiting and weight gain persist.  Plan:  1. Vomiting/ nausea: pregnancy ruled out - Phenergan 25 mg tablet PO PRN q6h for nausea - Discharge home with instructions to f/u with PCP  Caro Hight 09/25/2015, 6:05 PM

## 2015-09-25 NOTE — MAU Note (Signed)
Been getting dizzy a lot. Has been nauseous, vomiting a little. Started about a wk ago.

## 2015-11-17 ENCOUNTER — Ambulatory Visit: Payer: Self-pay | Admitting: Family Medicine

## 2015-11-23 ENCOUNTER — Ambulatory Visit (HOSPITAL_COMMUNITY)
Admission: EM | Admit: 2015-11-23 | Discharge: 2015-11-23 | Disposition: A | Discharge status: Home / Self Care | Payer: Self-pay | Attending: Physician Assistant | Admitting: Physician Assistant

## 2015-11-23 ENCOUNTER — Encounter (HOSPITAL_COMMUNITY): Payer: Self-pay | Admitting: Emergency Medicine

## 2015-11-23 DIAGNOSIS — M5440 Lumbago with sciatica, unspecified side: Secondary | ICD-10-CM

## 2015-11-23 MED ORDER — METHOCARBAMOL 500 MG PO TABS
500.0000 mg | ORAL_TABLET | Freq: Two times a day (BID) | ORAL | Status: DC
Start: 2015-11-23 — End: 2016-01-11

## 2015-11-23 MED ORDER — DICLOFENAC SODIUM 75 MG PO TBEC: 75.0000 mg | DELAYED_RELEASE_TABLET | Freq: Two times a day (BID) | ORAL | Status: DC

## 2015-11-23 NOTE — ED Provider Notes (Signed)
CSN: [id number]     Arrival date & time 11/23/15  1258 History   None    Chief Complaint  Patient presents with  . Back Pain   (Consider location/radiation/quality/duration/timing/severity/associated sxs/prior Treatment) Patient is a 30 y.o. female presenting with back pain. The history is provided by the patient. No language interpreter was used.  Back Pain Location:  Generalized Quality:  Aching Radiates to:  Does not radiate Pain severity:  Moderate Pain is:  Worse during the day Onset quality:  Sudden Duration:  2 days Timing:  Constant Progression:  Worsening Chronicity:  New Context: not recent illness   Relieved by:  Nothing Worsened by:  Nothing tried Ineffective treatments:  None tried Associated symptoms: no abdominal pain   Risk factors: not pregnant     Past Medical History  Diagnosis Date  . Back pain   . Obesity   . Depression     no meds since pregancny  . Abnormal Pap smear     repeat WNL  . PONV (postoperative nausea and vomiting)   . Injury of tendon of left rotator cuff   . Family history of adverse reaction to anesthesia     " MOTHER TAKES ALONG TIME TO WAKE "  . Cholelithiasis   . Infection     UTI   Past Surgical History  Procedure Laterality Date  . Tubes in ears  age 33  . Tonsillectomy      age 34  . Cholecystectomy N/A 12/05/2014    Procedure: LAPAROSCOPIC CHOLECYSTECTOMY WITH INTRAOPERATIVE CHOLANGIOGRAM;  Surgeon: Chevis Pretty III, MD;  Location: [facility] OR;  Service: General;  Laterality: N/A;   Family History  Problem Relation Age of Onset  . Depression Mother   . Diabetes Mother   . Hypertension Mother   . COPD Mother   . Anesthesia problems Mother   . Depression Sister   . Diabetes Sister   . Kidney disease Sister   . Hypertension Sister   . Hypertension Father   . Anesthesia problems Father   . COPD Father    Social History  Substance Use Topics  . Smoking status: Current Some Day Smoker -- 0.50 packs/day for 10 years   Types: Cigarettes  . Smokeless tobacco: Never Used  . Alcohol Use: No   OB History    Gravida Para Term Preterm AB TAB SAB Ectopic Multiple Living   0 1 0 1 0 0 2     Review of Systems  Gastrointestinal: Negative for abdominal pain.  Musculoskeletal: Positive for back pain.  All other systems reviewed and are negative.   Allergies  Cephalexin and Percocet  Home Medications   Prior to Admission medications   Medication Sig Start Date End Date Taking? Authorizing Provider  acetaminophen (TYLENOL) 325 MG tablet Take 650 mg by mouth every 6 (six) hours as needed for mild pain.    Historical Provider, MD  cetirizine (ZYRTEC) 10 MG tablet Take 1 tablet (10 mg total) by mouth daily. 07/12/15   Charm Rings, MD  diclofenac (VOLTAREN) 75 MG EC tablet Take 1 tablet (75 mg total) by mouth 2 (two) times daily. 11/23/15   Elson Areas, PA-C  methocarbamol (ROBAXIN) 500 MG tablet Take 1 tablet (500 mg total) by mouth 2 (two) times daily. 11/23/15   Elson Areas, PA-C  promethazine (PHENERGAN) 25 MG tablet Take 0.5-1 tablets (12.5-25 mg total) by mouth every 6 (six) hours as needed for nausea. 09/25/15   Wilmer Floor  Leftwich-Kirby, CNM   Meds Ordered and Administered this Visit  Medications - No data to display  LMP 11/23/2015 No data found.   Physical Exam  Constitutional: She is oriented to person, place, and time. She appears well-developed and well-nourished.  HENT:  Head: Normocephalic.  Eyes: EOM are normal. Pupils are equal, round, and reactive to light.  Neck: Normal range of motion.  Cardiovascular: Normal rate.   Pulmonary/Chest: Effort normal.  Abdominal: She exhibits no distension.  Musculoskeletal: Normal range of motion.  Tender low back tender left sciatic area  Neurological: She is alert and oriented to person, place, and time.  Skin: Skin is warm.  Psychiatric: She has a normal mood and affect.  Nursing note and vitals reviewed.   ED Course  Procedures (including  critical care time)  Labs Review Labs Reviewed - No data to display  Imaging Review No results found.   Visual Acuity Review  Right Eye Distance:   Left Eye Distance:   Bilateral Distance:    Right Eye Near:   Left Eye Near:    Bilateral Near:         MDM   1. Midline low back pain with sciatica, sciatica laterality unspecified    No outpatient prescriptions have been marked as taking for the 11/23/15 encounter [facility](Hospital Encounter).   Meds ordered this encounter  Medications  . diclofenac (VOLTAREN) 75 MG EC tablet    Sig: Take 1 tablet (75 mg total) by mouth 2 (two) times daily.    Dispense:  14 tablet    Refill:  0    Order Specific Question:  Supervising Provider    Answer:  Linna HoffKINDL, JAMES D [phone][[id number]]  . methocarbamol (ROBAXIN) 500 MG tablet    Sig: Take 1 tablet (500 mg total) by mouth 2 (two) times daily.    Dispense:  20 tablet    Refill:  0    Order Specific Question:  Supervising Provider    Answer:  Bradd CanaryKINDL, JAMES D [[id number]]    Lonia SkinnerLeslie K Palo BlancoSofia, PA-C 11/23/15 1341

## 2015-11-23 NOTE — Discharge Instructions (Signed)

## 2015-11-23 NOTE — ED Notes (Signed)
Lower, center back pain.  Onset 2 days ago when "pulling" heavy item.  Pain went away, but reoccurred last night.  Also reports "tingling a little" in left foot.

## 2015-12-05 ENCOUNTER — Encounter (HOSPITAL_COMMUNITY): Payer: Self-pay | Admitting: *Deleted

## 2015-12-05 ENCOUNTER — Inpatient Hospital Stay (HOSPITAL_COMMUNITY): Payer: Self-pay

## 2015-12-05 ENCOUNTER — Inpatient Hospital Stay (HOSPITAL_COMMUNITY)
Admission: AD | Admit: 2015-12-05 | Discharge: 2015-12-05 | Disposition: A | Discharge status: Home / Self Care | Payer: Self-pay | Source: Ambulatory Visit | Attending: Family Medicine | Admitting: Family Medicine

## 2015-12-05 DIAGNOSIS — F17210 Nicotine dependence, cigarettes, uncomplicated: Secondary | ICD-10-CM | POA: Insufficient documentation

## 2015-12-05 DIAGNOSIS — N83202 Unspecified ovarian cyst, left side: Secondary | ICD-10-CM | POA: Insufficient documentation

## 2015-12-05 DIAGNOSIS — F329 Major depressive disorder, single episode, unspecified: Secondary | ICD-10-CM | POA: Insufficient documentation

## 2015-12-05 DIAGNOSIS — N939 Abnormal uterine and vaginal bleeding, unspecified: Secondary | ICD-10-CM | POA: Insufficient documentation

## 2015-12-05 DIAGNOSIS — Z79899 Other long term (current) drug therapy: Secondary | ICD-10-CM | POA: Insufficient documentation

## 2015-12-05 DIAGNOSIS — R102 Pelvic and perineal pain: Secondary | ICD-10-CM

## 2015-12-05 LAB — POCT PREGNANCY, URINE: Preg Test, Ur: NEGATIVE

## 2015-12-05 LAB — URINALYSIS, ROUTINE W REFLEX MICROSCOPIC
Bilirubin Urine: NEGATIVE
GLUCOSE, UA: NEGATIVE mg/dL
Ketones, ur: NEGATIVE mg/dL
Nitrite: NEGATIVE
PH: 5.5 (ref 5.0–8.0)
PROTEIN: NEGATIVE mg/dL
SPECIFIC GRAVITY, URINE: 1.02 (ref 1.005–1.030)

## 2015-12-05 LAB — CBC WITH DIFFERENTIAL/PLATELET
BASOS PCT: 1 %
Basophils Absolute: 0.1 10*3/uL (ref 0.0–0.1)
Eosinophils Absolute: 0.3 10*3/uL (ref 0.0–0.7)
Eosinophils Relative: 3 %
HEMATOCRIT: 40.4 % (ref 36.0–46.0)
HEMOGLOBIN: 13.3 g/dL (ref 12.0–15.0)
LYMPHS PCT: 27 %
Lymphs Abs: 2.8 10*3/uL (ref 0.7–4.0)
MCH: 29.6 pg (ref 26.0–34.0)
MCHC: 32.9 g/dL (ref 30.0–36.0)
MCV: 89.8 fL (ref 78.0–100.0)
MONOS PCT: 4 %
Monocytes Absolute: 0.4 10*3/uL (ref 0.1–1.0)
NEUTROS ABS: 6.9 10*3/uL (ref 1.7–7.7)
NEUTROS PCT: 67 %
Platelets: 253 10*3/uL (ref 150–400)
RBC: 4.5 MIL/uL (ref 3.87–5.11)
RDW: 13.9 % (ref 11.5–15.5)
WBC: 10.4 10*3/uL (ref 4.0–10.5)

## 2015-12-05 LAB — URINE MICROSCOPIC-ADD ON

## 2015-12-05 LAB — WET PREP, GENITAL
SPERM: NONE SEEN
TRICH WET PREP: NONE SEEN
YEAST WET PREP: NONE SEEN

## 2015-12-05 NOTE — MAU Provider Note (Signed)
CSN: [id number]     Arrival date & time 12/05/15  1533 History   None    Chief Complaint  Patient presents with  . Vaginal Bleeding     (Consider location/radiation/quality/duration/timing/severity/associated sxs/prior Treatment) Vaginal Bleeding The patient's primary symptoms include vaginal bleeding. This is a new problem. The current episode started in the past 7 days. The problem occurs intermittently. The problem has been unchanged. The pain is mild. The problem affects both sides. She is not pregnant. Associated symptoms include abdominal pain.   Maureen Perez is a 30 y.o. female who presents to the ED with lower abdominal pain and vaginal bleeding that she describes as spotting. She reports that her last period was 2 weeks ago.   Past Medical History  Diagnosis Date  . Back pain   . Obesity   . Depression     no meds since pregancny  . Abnormal Pap smear     repeat WNL  . PONV (postoperative nausea and vomiting)   . Injury of tendon of left rotator cuff   . Family history of adverse reaction to anesthesia     " MOTHER TAKES ALONG TIME TO WAKE "  . Cholelithiasis   . Infection     UTI   Past Surgical History  Procedure Laterality Date  . Tubes in ears  age 31  . Tonsillectomy      age 40  . Cholecystectomy N/A 12/05/2014    Procedure: LAPAROSCOPIC CHOLECYSTECTOMY WITH INTRAOPERATIVE CHOLANGIOGRAM;  Surgeon: Chevis Pretty III, MD;  Location: [facility] OR;  Service: General;  Laterality: N/A;   Family History  Problem Relation Age of Onset  . Depression Mother   . Diabetes Mother   . Hypertension Mother   . COPD Mother   . Anesthesia problems Mother   . Depression Sister   . Diabetes Sister   . Kidney disease Sister   . Hypertension Sister   . Hypertension Father   . Anesthesia problems Father   . COPD Father    Social History  Substance Use Topics  . Smoking status: Current Some Day Smoker -- 0.50 packs/day for 10 years    Types: Cigarettes  . Smokeless tobacco:  Never Used  . Alcohol Use: No   OB History    Gravida Para Term Preterm AB TAB SAB Ectopic Multiple Living   0 1 0 1 0 0 2     Review of Systems  Gastrointestinal: Positive for abdominal pain.  Genitourinary: Positive for vaginal bleeding.  all other systems negative    Allergies  Cephalexin and Percocet  Home Medications   Prior to Admission medications   Medication Sig Start Date End Date Taking? Authorizing Provider  acetaminophen (TYLENOL) 325 MG tablet Take 650 mg by mouth every 6 (six) hours as needed for mild pain.   Yes Historical Provider, MD  diclofenac (VOLTAREN) 75 MG EC tablet Take 1 tablet (75 mg total) by mouth 2 (two) times daily. 11/23/15  Yes Lonia Skinner Sofia, PA-C  methocarbamol (ROBAXIN) 500 MG tablet Take 1 tablet (500 mg total) by mouth 2 (two) times daily. 11/23/15  Yes Lonia Skinner Sofia, PA-C  promethazine (PHENERGAN) 25 MG tablet Take 0.5-1 tablets (12.5-25 mg total) by mouth every 6 (six) hours as needed for nausea. Patient not taking: Reported on 12/05/2015 09/25/15   Misty Stanley A Leftwich-Kirby, CNM   BP 114/60 mmHg  Pulse 58  Temp(Src) 98.4 F (36.9 C) (Oral)  Resp 16  Wt 159 lb 12.8  oz (72.485 kg)  LMP 11/24/2015 Physical Exam  Constitutional: She is oriented to person, place, and time. She appears well-developed and well-nourished.  HENT:  Head: Normocephalic.  Eyes: EOM are normal.  Neck: Neck supple.  Cardiovascular: Normal rate.   Pulmonary/Chest: Effort normal.  Abdominal: Soft.  Lower abdominal tenderness with deep palpation. No guarding or rebound.   Genitourinary:  External genitalia without lesions. Scant blood vaginal vault. No CMT, left adnexal tenderness that is mild.   Musculoskeletal: Normal range of motion.  Neurological: She is alert and oriented to person, place, and time. No cranial nerve deficit.  Skin: Skin is warm and dry.  Psychiatric: She has a normal mood and affect. Her behavior is normal.  Nursing note and vitals  reviewed.   ED Course  Procedures (including critical care time) Labs Review Results for orders placed or performed during the hospital encounter of 12/05/15 (from the past 24 hour(s))  Urinalysis, Routine w reflex microscopic ([facility])     Status: Abnormal   Collection Time: 12/05/15  4:04 PM  Result Value Ref Range   Color, Urine YELLOW YELLOW   APPearance CLEAR CLEAR   Specific Gravity, Urine 1.020 1.005 - 1.030   pH 5.5 5.0 - 8.0   Glucose, UA NEGATIVE NEGATIVE mg/dL   Hgb urine dipstick TRACE (A) NEGATIVE   Bilirubin Urine NEGATIVE NEGATIVE   Ketones, ur NEGATIVE NEGATIVE mg/dL   Protein, ur NEGATIVE NEGATIVE mg/dL   Nitrite NEGATIVE NEGATIVE   Leukocytes, UA SMALL (A) NEGATIVE  Urine microscopic-add on     Status: Abnormal   Collection Time: 12/05/15  4:04 PM  Result Value Ref Range   Squamous Epithelial / LPF 0-5 (A) NONE SEEN   WBC, UA 0-5 0 - 5 WBC/hpf   RBC / HPF 0-5 0 - 5 RBC/hpf   Bacteria, UA MANY (A) NONE SEEN  Pregnancy, urine POC     Status: None   Collection Time: 12/05/15  4:13 PM  Result Value Ref Range   Preg Test, Ur NEGATIVE NEGATIVE  Wet prep, genital     Status: Abnormal   Collection Time: 12/05/15  4:20 PM  Result Value Ref Range   Yeast Wet Prep HPF POC NONE SEEN NONE SEEN   Trich, Wet Prep NONE SEEN NONE SEEN   Clue Cells Wet Prep HPF POC PRESENT (A) NONE SEEN   WBC, Wet Prep HPF POC MANY (A) NONE SEEN   Sperm NONE SEEN   CBC with Differential/Platelet     Status: None   Collection Time: 12/05/15  4:41 PM  Result Value Ref Range   WBC 10.4 4.0 - 10.5 K/uL   RBC 4.50 3.87 - 5.11 MIL/uL   Hemoglobin 13.3 12.0 - 15.0 g/dL   HCT 16.140.4 09.636.0 - 04.546.0 %   MCV 89.8 78.0 - 100.0 fL   MCH 29.6 26.0 - 34.0 pg   MCHC 32.9 30.0 - 36.0 g/dL   RDW 40.913.9 81.111.5 - 91.415.5 %   Platelets 253 150 - 400 K/uL   Neutrophils Relative % 67 %   Neutro Abs 6.9 1.7 - 7.7 K/uL   Lymphocytes Relative 27 %   Lymphs Abs 2.8 0.7 - 4.0 K/uL   Monocytes Relative 4 %    Monocytes Absolute 0.4 0.1 - 1.0 K/uL   Eosinophils Relative 3 %   Eosinophils Absolute 0.3 0.0 - 0.7 K/uL   Basophils Relative 1 %   Basophils Absolute 0.1 0.0 - 0.1 K/uL     Imaging  Review US Transvaginal Non-ob  12/05/2015  CLINICAL DATA:  Abnormal uterine bleeding. EXAM: TRANSABDOMINAL AND TRANSVAGINAL ULTRASOUND OF PELVIS TECHNIQUE: Both transabdominal and transvaginal ultrasound examinations of the pelvis were performed. Transabdominal technique was performed for global imaging of the pelvis including uterus, ovaries, adnexal regions, and pelvic cul-de-sac. It was necessary to proceed with endovaginal exam following the transabdominal exam to visualize the ovaries and endometrium. COMPARISON:  Multiple prior ultrasound examinations and CT scan 08/24/2014 FINDINGS: Uterus Measurements: 8.5 x 4.7 x 5.0 cm. No fibroids or other mass visualized. Endometrium Thickness: 9.0 mm. Areas of slight increased echogenicity and vascularity likely due to menses. No endometrial lesion is identified. Right ovary Measurements: 2.6 x 1.6 x 2.2 cm. Normal appearance/no adnexal mass. Left ovary Measurements: 4.2 x 3.4 x 3.6 cm. Simple appearing 3.1 x 3.0 x 2.7 cm cyst. Other findings No abnormal free fluid. IMPRESSION: 1. Normal appearance of the uterus. 2. Slightly echogenic endometrium likely due to menses. No obvious endometrial lesion. 3. Simple appearing left ovarian cyst. Electronically Signed   By: Rudie Meyer [name]   On: 12/05/2015 18:10   US Pelvis Complete  12/05/2015  CLINICAL DATA:  Abnormal uterine bleeding. EXAM: TRANSABDOMINAL AND TRANSVAGINAL ULTRASOUND OF PELVIS TECHNIQUE: Both transabdominal and transvaginal ultrasound examinations of the pelvis were performed. Transabdominal technique was performed for global imaging of the pelvis including uterus, ovaries, adnexal regions, and pelvic cul-de-sac. It was necessary to proceed with endovaginal exam following the transabdominal exam to visualize the  ovaries and endometrium. COMPARISON:  Multiple prior ultrasound examinations and CT scan 08/24/2014 FINDINGS: Uterus Measurements: 8.5 x 4.7 x 5.0 cm. No fibroids or other mass visualized. Endometrium Thickness: 9.0 mm. Areas of slight increased echogenicity and vascularity likely due to menses. No endometrial lesion is identified. Right ovary Measurements: 2.6 x 1.6 x 2.2 cm. Normal appearance/no adnexal mass. Left ovary Measurements: 4.2 x 3.4 x 3.6 cm. Simple appearing 3.1 x 3.0 x 2.7 cm cyst. Other findings No abnormal free fluid. IMPRESSION: 1. Normal appearance of the uterus. 2. Slightly echogenic endometrium likely due to menses. No obvious endometrial lesion. 3. Simple appearing left ovarian cyst. Electronically Signed   By: Rudie Meyer [name]   On: 12/05/2015 18:10   I have personally reviewed and evaluated these images and lab results as part of my medical decision-making.   MDM  29 y.o. female with pelvic pain and vaginal bleeding stable for d/c without heavy bleeding and normal ultrasound. She will take tylenol and ibuprofen as needed for pain and f/u in the GYN Clinic for Pap Smear and routine GYN health care. Discussed with the patient lab and u/s results and all questioned fully answered.   GC, Chlamydia cultures pending.   Final diagnoses:  Ovarian cyst, left  Abnormal vaginal bleeding

## 2015-12-05 NOTE — MAU Note (Addendum)
Already had a period this month, was abnormal, just spotting.  Started spotting again today at work, period is not due.  Has been really hormonal

## 2015-12-05 NOTE — Discharge Instructions (Signed)
Take tylenol or ibuprofen as needed for pain f/u with the GYN Clinic for the abnormal bleeding.

## 2015-12-06 LAB — GC/CHLAMYDIA PROBE AMP (~~LOC~~) NOT AT ARMC
Chlamydia: NEGATIVE
Neisseria Gonorrhea: NEGATIVE

## 2016-01-01 ENCOUNTER — Ambulatory Visit: Payer: Self-pay | Admitting: Obstetrics & Gynecology

## 2016-01-11 ENCOUNTER — Encounter: Payer: Self-pay | Admitting: Obstetrics & Gynecology

## 2016-01-11 ENCOUNTER — Ambulatory Visit (INDEPENDENT_AMBULATORY_CARE_PROVIDER_SITE_OTHER): Payer: BLUE CROSS/BLUE SHIELD | Admitting: Obstetrics & Gynecology

## 2016-01-11 VITALS — BP 125/82 | HR 76 | Ht 61.0 in | Wt 157.0 lb

## 2016-01-11 DIAGNOSIS — Z113 Encounter for screening for infections with a predominantly sexual mode of transmission: Secondary | ICD-10-CM

## 2016-01-11 DIAGNOSIS — Z01419 Encounter for gynecological examination (general) (routine) without abnormal findings: Secondary | ICD-10-CM | POA: Diagnosis not present

## 2016-01-11 DIAGNOSIS — Z124 Encounter for screening for malignant neoplasm of cervix: Secondary | ICD-10-CM

## 2016-01-11 NOTE — Addendum Note (Signed)
Addended by: Arne Cleveland on: 01/11/2016 11:06 AM   Modules accepted: Orders

## 2016-01-11 NOTE — Progress Notes (Signed)
Subjective:    Maureen Perez is a 30 y.o. SW P2 (2 and 52 yo kids) female who presents for an annual exam. The patient has no complaints today. Her ovarian cyst pain has resolved.  The patient is sexually active. GYN screening history: last pap: was normal. The patient wears seatbelts: yes. The patient participates in regular exercise: yes. Has the patient ever been transfused or tattooed?: yes. The patient reports that there is not domestic violence in her life.   Menstrual History: OB History    Gravida Para Term Preterm AB Living   3 2 2  0 1 2   SAB TAB Ectopic Multiple Live Births   1 0 0 0 2      Menarche age: 41 Patient's last menstrual period was 01/10/2016.    The following portions of the patient's history were reviewed and updated as appropriate: allergies, current medications, past family history, past medical history, past social history, past surgical history and problem list.  Review of Systems Pertinent items are noted in HPI.  Monogamous for 6 months. She wants to get pregnant, on MVI. Works [employer] operational officer.   Objective:    BP 125/82   Pulse 76   Ht 5\' 1"  (1.549 m)   Wt 157 lb (71.2 kg)   LMP 01/10/2016   BMI 29.66 kg/m   General Appearance:    Alert, cooperative, no distress, appears stated age  Head:    Normocephalic, without obvious abnormality, atraumatic  Eyes:    PERRL, conjunctiva/corneas clear, EOM's intact, fundi    benign, both eyes  Ears:    Normal TM's and external ear canals, both ears  Nose:   Nares normal, septum midline, mucosa normal, no drainage    or sinus tenderness  Throat:   Lips, mucosa, and tongue normal; teeth and gums normal  Neck:   Supple, symmetrical, trachea midline, no adenopathy;    thyroid:  no enlargement/tenderness/nodules; no carotid   bruit or JVD  Back:     Symmetric, no curvature, ROM normal, no CVA tenderness  Lungs:     Clear to auscultation bilaterally, respirations unlabored  Chest Wall:    No tenderness or  deformity   Heart:    Regular rate and rhythm, S1 and S2 normal, no murmur, rub   or gallop  Breast Exam:    No tenderness, masses, or nipple abnormality  Abdomen:     Soft, non-tender, bowel sounds active all four quadrants,    no masses, no organomegaly  Genitalia:    Normal female without lesion, discharge or tenderness,NSSA, NT, no adnexal masses     Extremities:   Extremities normal, atraumatic, no cyanosis or edema  Pulses:   2+ and symmetric all extremities  Skin:   Skin color, texture, turgor normal, no rashes or lesions  Lymph nodes:   Cervical, supraclavicular, and axillary nodes normal  Neurologic:   CNII-XII intact, normal strength, sensation and reflexes    throughout   .    Assessment:    Healthy female exam.    Plan:     Breast self exam technique reviewed and patient encouraged to perform self-exam monthly. Thin prep Pap smear. with cotesting Rec stop smoking

## 2016-01-12 LAB — GC/CHLAMYDIA PROBE AMP (~~LOC~~) NOT AT ARMC
CHLAMYDIA, DNA PROBE: NEGATIVE
NEISSERIA GONORRHEA: NEGATIVE

## 2016-01-15 LAB — CYTOLOGY - PAP

## 2016-02-22 ENCOUNTER — Encounter (HOSPITAL_COMMUNITY): Payer: Self-pay

## 2016-02-22 ENCOUNTER — Emergency Department (HOSPITAL_COMMUNITY)
Admission: EM | Admit: 2016-02-22 | Discharge: 2016-02-22 | Disposition: A | Discharge status: Home / Self Care | Payer: BLUE CROSS/BLUE SHIELD | Attending: Emergency Medicine | Admitting: Emergency Medicine

## 2016-02-22 DIAGNOSIS — M545 Low back pain: Secondary | ICD-10-CM | POA: Insufficient documentation

## 2016-02-22 DIAGNOSIS — F17210 Nicotine dependence, cigarettes, uncomplicated: Secondary | ICD-10-CM | POA: Insufficient documentation

## 2016-02-22 DIAGNOSIS — N390 Urinary tract infection, site not specified: Secondary | ICD-10-CM

## 2016-02-22 DIAGNOSIS — M549 Dorsalgia, unspecified: Secondary | ICD-10-CM | POA: Diagnosis present

## 2016-02-22 LAB — URINE MICROSCOPIC-ADD ON

## 2016-02-22 LAB — POC URINE PREG, ED: PREG TEST UR: NEGATIVE

## 2016-02-22 LAB — URINALYSIS, ROUTINE W REFLEX MICROSCOPIC
BILIRUBIN URINE: NEGATIVE
Glucose, UA: NEGATIVE mg/dL
KETONES UR: NEGATIVE mg/dL
NITRITE: POSITIVE — AB
PH: 6.5 (ref 5.0–8.0)
PROTEIN: NEGATIVE mg/dL
Specific Gravity, Urine: 1.013 (ref 1.005–1.030)

## 2016-02-22 MED ORDER — SULFAMETHOXAZOLE-TRIMETHOPRIM 800-160 MG PO TABS: 1.0000 | ORAL_TABLET | Freq: Two times a day (BID) | ORAL | 0 refills | Status: DC

## 2016-02-22 MED ORDER — METHOCARBAMOL 500 MG PO TABS: 500.0000 mg | ORAL_TABLET | Freq: Three times a day (TID) | ORAL | 0 refills | Status: DC | PRN

## 2016-02-22 MED ORDER — NAPROXEN 500 MG PO TABS: 500.0000 mg | ORAL_TABLET | Freq: Two times a day (BID) | ORAL | 0 refills | Status: DC

## 2016-02-22 NOTE — ED Provider Notes (Signed)
[facility]-EMERGENCY DEPT Provider Note   CSN: [id number] Arrival date & time: 02/22/16  0955     History   Chief Complaint Chief Complaint  Patient presents with  . Back Pain    HPI Maureen Perez is a 30 y.o. female.   Back Pain   This is a new problem. The current episode started yesterday. The problem occurs constantly. The problem has not changed since onset.Associated with: no lifting or injury but does bend over a lot at work. The pain does not radiate. The pain is severe. The symptoms are aggravated by bending. Associated symptoms include numbness (legs feel numb) and dysuria. Pertinent negatives include no fever, no bowel incontinence, no leg pain, no paresis, no tingling and no weakness. Treatments tried: ibuprofen. The treatment provided no relief.    Past Medical History:  Diagnosis Date  . Abnormal Pap smear    repeat WNL  . Back pain   . Cholelithiasis   . Depression    no meds since pregancny  . Family history of adverse reaction to anesthesia    " MOTHER TAKES ALONG TIME TO WAKE "  . Infection    UTI  . Injury of tendon of left rotator cuff   . Obesity   . PONV (postoperative nausea and vomiting)     Patient Active Problem List   Diagnosis Date Noted  . Acute cholecystitis 12/05/2014  . RUQ pain 08/26/2014  . Chest pain 08/18/2013  . SGA (small for gestational age) 08/09/2013  . Fetus SGA (small for gestational age), antepartum 07/07/2013  . GERD (gastroesophageal reflux disease) 06/15/2013  . Smoking complicating pregnancy, antepartum 01/12/2013  . Rubella non-immune status, antepartum 12/24/2012  . Pap smear abnormality of cervix with LGSIL 10/07/2011  . Supervision of high-risk pregnancy 02/26/2011  . Lymphadenitis 09/11/2010  . DEPRESSION, MODERATE, RECURRENT 03/20/2010  . 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 INSOMNIA 01/06/2009  . TOBACCO ABUSE 11/22/2008  . ALLERGIC RHINITIS DUE TO ANIMAL HAIR AND DANDER 11/22/2008  . OBESITY  10/20/2008    Past Surgical History:  Procedure Laterality Date  . CHOLECYSTECTOMY N/A 12/05/2014   Procedure: LAPAROSCOPIC CHOLECYSTECTOMY WITH INTRAOPERATIVE CHOLANGIOGRAM;  Surgeon: Chevis PrettyPaul Toth III, MD;  Location: [facility] OR;  Service: General;  Laterality: N/A;  . TONSILLECTOMY     age 89  . tubes in ears  age 89    OB History    Gravida Para Term Preterm AB Living   3 2 2  0 1 2   SAB TAB Ectopic Multiple Live Births   1 0 0 0 2       Home Medications    Prior to Admission medications   Medication Sig Start Date End Date Taking? Authorizing Provider  acetaminophen (TYLENOL) 325 MG tablet Take 650 mg by mouth every 6 (six) hours as needed for mild pain.    Historical Provider, MD  methocarbamol (ROBAXIN) 500 MG tablet Take 1 tablet (500 mg total) by mouth every 8 (eight) hours as needed for muscle spasms. 02/22/16   Linwood DibblesJon Brittini Brubeck, MD  naproxen (NAPROSYN) 500 MG tablet Take 1 tablet (500 mg total) by mouth 2 (two) times daily with a meal. As needed for pain 02/22/16   Linwood DibblesJon Veniamin Kincaid, MD  sulfamethoxazole-trimethoprim (BACTRIM DS,SEPTRA DS) 800-160 MG tablet Take 1 tablet by mouth 2 (two) times daily. 02/22/16   Linwood DibblesJon Dereona Kolodny, MD    Family History Family History  Problem Relation Age of Onset  . Depression Mother   . Diabetes Mother   .  Hypertension Mother   . COPD Mother   . Anesthesia problems Mother   . Depression Sister   . Diabetes Sister   . Kidney disease Sister   . Hypertension Sister   . Hypertension Father   . Anesthesia problems Father   . COPD Father     Social History Social History  Substance Use Topics  . Smoking status: Current Some Day Smoker    Packs/day: 0.50    Years: 10.00    Types: Cigarettes  . Smokeless tobacco: Never Used  . Alcohol use No     Allergies   Cephalexin and Percocet [oxycodone-acetaminophen]   Review of Systems Review of Systems  Constitutional: Negative for fever.  Gastrointestinal: Negative for bowel incontinence.  Genitourinary:  Positive for dysuria and frequency.  Musculoskeletal: Positive for back pain.  Neurological: Positive for numbness (legs feel numb). Negative for tingling and weakness.  All other systems reviewed and are negative.    Physical Exam Updated Vital Signs BP 139/73 (BP Location: Left Arm)   Pulse 83   Temp 97.8 F (36.6 C) (Oral)   Resp 16   Ht 5\' 1"  (1.549 m)   LMP 01/22/2016   SpO2 100%   Physical Exam  Constitutional: She appears well-developed and well-nourished. No distress.  HENT:  Head: Normocephalic and atraumatic.  Right Ear: External ear normal.  Left Ear: External ear normal.  Nose: Nose normal.  Eyes: Conjunctivae and EOM are normal. Right eye exhibits no discharge. Left eye exhibits no discharge. No scleral icterus.  Neck: Neck supple. No tracheal deviation present.  Cardiovascular: Normal rate.   Pulmonary/Chest: Effort normal. No stridor. No respiratory distress.  Abdominal: She exhibits no distension.  No CVA tenderness  Musculoskeletal: She exhibits no edema or tenderness.       Lumbar back: She exhibits decreased range of motion, pain and spasm. She exhibits no swelling and no edema.  Neurological: She is alert. She is not disoriented. No sensory deficit. Cranial nerve deficit: no gross deficits. She exhibits normal muscle tone. Coordination normal.  Reflex Scores:      Patellar reflexes are 2+ on the right side and 2+ on the left side.      Achilles reflexes are 2+ on the right side and 2+ on the left side. Skin: Skin is warm and dry. No rash noted. She is not diaphoretic. No erythema.  Psychiatric: She has a normal mood and affect. Her behavior is normal. Thought content normal.  Nursing note and vitals reviewed.    ED Treatments / Results  Labs (all labs ordered are listed, but only abnormal results are displayed) Labs Reviewed  URINALYSIS, ROUTINE W REFLEX MICROSCOPIC (NOT AT [facility]) - Abnormal; Notable for the following:       Result Value    APPearance CLOUDY (*)    Hgb urine dipstick SMALL (*)    Nitrite POSITIVE (*)    Leukocytes, UA LARGE (*)    All other components within normal limits  URINE MICROSCOPIC-ADD ON - Abnormal; Notable for the following:    Squamous Epithelial / LPF 6-30 (*)    Bacteria, UA MANY (*)    All other components within normal limits  POC URINE PREG, ED      Procedures Procedures (including critical care time)   Initial Impression / Assessment and Plan / ED Course  I have reviewed the triage vital signs and the nursing notes.  Pertinent labs & imaging results that were available during my care of the patient were  reviewed by me and considered in my medical decision making (see chart for details).  Clinical Course    Patient has normal strength and sensation on exam. I doubt sciatica.  I suspect her back pain is related to a lumbar strain.  Urinalysis is consistent with urinary tract infection and the patient is having symptoms of UTI. She has a Keflex allergy I will prescribe Septra. Discussed primary care doctor follow-up  Final Clinical Impressions(s) / ED Diagnoses   Final diagnoses:  UTI (lower urinary tract infection)  Low back pain without sciatica, unspecified back pain laterality    New Prescriptions New Prescriptions   METHOCARBAMOL (ROBAXIN) 500 MG TABLET    Take 1 tablet (500 mg total) by mouth every 8 (eight) hours as needed for muscle spasms.   NAPROXEN (NAPROSYN) 500 MG TABLET    Take 1 tablet (500 mg total) by mouth 2 (two) times daily with a meal. As needed for pain   SULFAMETHOXAZOLE-TRIMETHOPRIM (BACTRIM DS,SEPTRA DS) 800-160 MG TABLET    Take 1 tablet by mouth 2 (two) times daily.     Linwood Dibbles, MD 02/22/16 1141

## 2016-02-22 NOTE — ED Notes (Signed)
Denies nausea at the moment.

## 2016-02-22 NOTE — ED Triage Notes (Addendum)
Pt. Having lower lumbar pain started 2 days ago without any injury. She reports that the pain is sharp and she is having intermittent numbness to both legs.  She is also having nausea.  No distress noted. Gait steady. Alert and oriented /x4, Pt. Also reports feeling pressure urinating and hesitancy.  Pt. does not want any medication for nausea at the present time

## 2016-02-22 NOTE — ED Notes (Signed)
MD at bedside. 

## 2016-02-22 NOTE — Discharge Instructions (Signed)
Follow-up with your primary doctor in 1 week to make sure the urinary tract infection has resolved. Return for fever or vomiting.  Take medications as needed for ear pain. Follow-up with your primary doctor a few having persistent trouble with her back pain. Monitor for acute weakness or other concerning symptoms

## 2016-02-24 LAB — URINE CULTURE

## 2016-03-20 ENCOUNTER — Encounter (HOSPITAL_COMMUNITY): Payer: Self-pay

## 2016-03-20 ENCOUNTER — Inpatient Hospital Stay (HOSPITAL_COMMUNITY)
Admission: AD | Admit: 2016-03-20 | Discharge: 2016-03-20 | Disposition: A | Discharge status: Home / Self Care | Payer: BLUE CROSS/BLUE SHIELD | Source: Ambulatory Visit | Attending: Obstetrics and Gynecology | Admitting: Obstetrics and Gynecology

## 2016-03-20 DIAGNOSIS — M549 Dorsalgia, unspecified: Secondary | ICD-10-CM | POA: Diagnosis present

## 2016-03-20 DIAGNOSIS — M545 Low back pain, unspecified: Secondary | ICD-10-CM

## 2016-03-20 DIAGNOSIS — F17210 Nicotine dependence, cigarettes, uncomplicated: Secondary | ICD-10-CM | POA: Insufficient documentation

## 2016-03-20 DIAGNOSIS — T148XXA Other injury of unspecified body region, initial encounter: Secondary | ICD-10-CM

## 2016-03-20 LAB — URINE MICROSCOPIC-ADD ON

## 2016-03-20 LAB — URINALYSIS, ROUTINE W REFLEX MICROSCOPIC
Bilirubin Urine: NEGATIVE
Glucose, UA: NEGATIVE mg/dL
HGB URINE DIPSTICK: NEGATIVE
Ketones, ur: NEGATIVE mg/dL
Nitrite: NEGATIVE
Protein, ur: NEGATIVE mg/dL
SPECIFIC GRAVITY, URINE: 1.015 (ref 1.005–1.030)
pH: 6 (ref 5.0–8.0)

## 2016-03-20 LAB — POCT PREGNANCY, URINE: PREG TEST UR: NEGATIVE

## 2016-03-20 MED ORDER — IBUPROFEN 600 MG PO TABS: 600.0000 mg | ORAL_TABLET | Freq: Four times a day (QID) | ORAL | 0 refills | Status: DC | PRN

## 2016-03-20 MED ORDER — CYCLOBENZAPRINE HCL 10 MG PO TABS: 10.0000 mg | ORAL_TABLET | Freq: Two times a day (BID) | ORAL | 0 refills | Status: DC | PRN

## 2016-03-20 NOTE — MAU Provider Note (Signed)
History     CSN: [id number]  Arrival date and time: 03/20/16 1733   First Provider Initiated Contact with Patient 03/20/16 1931      Chief Complaint  Patient presents with  . Back Pain   HPI   [name] is a 30 y.o. non-pregnant female [phone] here with right sided back pain. The back pain started 2 days ago. The pain comes and goes. She bends over a lot and pushes a lot during work. She was recently diagnosed with a UTI and completed bactrim X 4 days; she wanted to make sure it was not a kidney infection. Denies fever. The pain is specifically in the right lower back. The pain does not radiate.   OB History    Gravida Para Term Preterm AB Living   4 2 2  0 2 2   SAB TAB Ectopic Multiple Live Births   2 0 0 0 2      Past Medical History:  Diagnosis Date  . Abnormal Pap smear    repeat WNL  . Back pain   . Cholelithiasis   . Depression    no meds since pregancny  . Family history of adverse reaction to anesthesia    " MOTHER TAKES ALONG TIME TO WAKE "  . Infection    UTI  . Injury of tendon of left rotator cuff   . Obesity   . PONV (postoperative nausea and vomiting)     Past Surgical History:  Procedure Laterality Date  . CHOLECYSTECTOMY N/A 12/05/2014   Procedure: LAPAROSCOPIC CHOLECYSTECTOMY WITH INTRAOPERATIVE CHOLANGIOGRAM;  Surgeon: Chevis PrettyPaul Toth III, MD;  Location: [facility] OR;  Service: General;  Laterality: N/A;  . TONSILLECTOMY     age 30  . tubes in ears  age 89    Family History  Problem Relation Age of Onset  . Depression Mother   . Diabetes Mother   . Hypertension Mother   . COPD Mother   . Anesthesia problems Mother   . Depression Sister   . Diabetes Sister   . Kidney disease Sister   . Hypertension Sister   . Hypertension Father   . Anesthesia problems Father   . COPD Father     Social History  Substance Use Topics  . Smoking status: Current Some Day Smoker    Packs/day: 0.50    Years: 10.00    Types: Cigarettes  . Smokeless  tobacco: Never Used  . Alcohol use No    Allergies:  Allergies  Allergen Reactions  . Cephalexin Itching and Nausea And Vomiting  . Percocet [Oxycodone-Acetaminophen] Nausea And Vomiting    Prescriptions Prior to Admission  Medication Sig Dispense Refill Last Dose  . acetaminophen (TYLENOL) 325 MG tablet Take 650 mg by mouth every 6 (six) hours as needed for mild pain.   Past Week at Unknown time  . Multiple Vitamin (MULTIVITAMIN WITH MINERALS) TABS tablet Take 1 tablet by mouth daily.   03/20/2016 at Unknown time  . methocarbamol (ROBAXIN) 500 MG tablet Take 1 tablet (500 mg total) by mouth every 8 (eight) hours as needed for muscle spasms. (Patient not taking: Reported on 03/20/2016) 20 tablet 0 Not Taking at Unknown time  . naproxen (NAPROSYN) 500 MG tablet Take 1 tablet (500 mg total) by mouth 2 (two) times daily with a meal. As needed for pain (Patient not taking: Reported on 03/20/2016) 20 tablet 0 Not Taking at Unknown time  . sulfamethoxazole-trimethoprim (BACTRIM DS,SEPTRA DS) 800-160 MG tablet Take 1 tablet by  mouth 2 (two) times daily. (Patient not taking: Reported on 03/20/2016) 6 tablet 0 Not Taking at Unknown time   Results for orders placed or performed during the hospital encounter of 03/20/16 (from the past 48 hour(s))  Urinalysis, Routine w reflex microscopic (not at [facility])     Status: Abnormal   Collection Time: 03/20/16  6:25 PM  Result Value Ref Range   Color, Urine YELLOW YELLOW   APPearance HAZY (A) CLEAR   Specific Gravity, Urine 1.015 1.005 - 1.030   pH 6.0 5.0 - 8.0   Glucose, UA NEGATIVE NEGATIVE mg/dL   Hgb urine dipstick NEGATIVE NEGATIVE   Bilirubin Urine NEGATIVE NEGATIVE   Ketones, ur NEGATIVE NEGATIVE mg/dL   Protein, ur NEGATIVE NEGATIVE mg/dL   Nitrite NEGATIVE NEGATIVE   Leukocytes, UA TRACE (A) NEGATIVE  Urine microscopic-add on     Status: Abnormal   Collection Time: 03/20/16  6:25 PM  Result Value Ref Range   Squamous Epithelial / LPF 6-30 (A)  NONE SEEN   WBC, UA 0-5 0 - 5 WBC/hpf   RBC / HPF 0-5 0 - 5 RBC/hpf   Bacteria, UA FEW (A) NONE SEEN  Pregnancy, urine POC     Status: None   Collection Time: 03/20/16  6:30 PM  Result Value Ref Range   Preg Test, Ur NEGATIVE NEGATIVE    Comment:        THE SENSITIVITY OF THIS METHODOLOGY IS >24 mIU/mL     Review of Systems  Constitutional: Negative for chills and fever.  Genitourinary: Negative for dysuria and urgency.   Physical Exam   Blood pressure 133/81, pulse 61, temperature 98.1 F (36.7 C), temperature source Oral, resp. rate 18, height 5\' 1"  (1.549 m), weight 162 lb (73.5 kg), last menstrual period 02/29/2016, SpO2 98 %.  Physical Exam  Constitutional: She is oriented to person, place, and time. She appears well-developed and well-nourished. No distress.  HENT:  Head: Normocephalic.  GI: There is no CVA tenderness.  Musculoskeletal: Normal range of motion.       Lumbar back: She exhibits tenderness (Right side>Left side ). She exhibits normal range of motion, no bony tenderness, no swelling, no edema and no deformity.  Neurological: She is alert and oriented to person, place, and time.  Skin: She is not diaphoretic.    MAU Course  Procedures  None  MDM  UA- with no sign of UTI Urine culture from previous ED visit shows sensitive to bactrim. Patient is without symptoms other than lower back pain without CVA tenderness.    Assessment and Plan   A:  1. Acute right-sided low back pain without sciatica   2. Pulled muscle     P:  Discharge home in stable condition Rx: Flexeril, ibuprofen If symptoms worsen go to Urgent care.  Follow up with PCP as needed Rest and heat/ICE discussed.   Duane Lope, NP 03/20/2016 7:43 PM

## 2016-03-20 NOTE — MAU Note (Addendum)
Pt had a UTI 2 weeks ago and was given antibiotics. Pt started having back pain about 2 days ago and wants to be checked for a kidney infection. Pt states she has gained 8 pounds in the last week and that isn't normal for her.

## 2016-03-20 NOTE — Discharge Instructions (Signed)
Back Injury Prevention Back injuries can be very painful. They can also be difficult to heal. After having one back injury, you are more likely to injure your back again. It is important to learn how to avoid injuring or re-injuring your back. The following tips can help you to prevent a back injury. WHAT SHOULD I KNOW ABOUT PHYSICAL FITNESS?  Exercise for 30 minutes per day on most days of the week or as directed by your health care provider. Make sure to:  Do aerobic exercises, such as walking, jogging, biking, or swimming.  Do exercises that increase balance and strength, such as tai chi and yoga. These can decrease your risk of falling and injuring your back.  Do stretching exercises to help with flexibility.  Try to develop strong abdominal muscles. Your abdominal muscles provide a lot of the support that is needed by your back.  Maintain a healthy weight. This helps to decrease your risk of a back injury. WHAT SHOULD I KNOW ABOUT MY DIET?  Talk with your health care provider about your overall diet. Take supplements and vitamins only as directed by your health care provider.  Talk with your health care provider about how much calcium and vitamin D you need each day. These nutrients help to prevent weakening of the bones (osteoporosis). Osteoporosis can cause broken (fractured) bones, which lead to back pain.  Include good sources of calcium in your diet, such as dairy products, green leafy vegetables, and products that have had calcium added to them (fortified).  Include good sources of vitamin D in your diet, such as milk and foods that are fortified with vitamin D. WHAT SHOULD I KNOW ABOUT MY POSTURE?  Sit up straight and stand up straight. Avoid leaning forward when you sit or hunching over when you stand.  Choose chairs that have good low-back (lumbar) support.  If you work at a desk, sit close to it so you do not need to lean over. Keep your chin tucked in. Keep your neck  drawn back, and keep your elbows bent at a right angle. Your arms should look like the letter "L."  Sit high and close to the steering wheel when you drive. Add a lumbar support to your car seat, if needed.  Avoid sitting or standing in one position for very long. Take breaks to get up, stretch, and walk around at least one time every hour. Take breaks every hour if you are driving for long periods of time.  Sleep on your side with your knees slightly bent, or sleep on your back with a pillow under your knees. Do not lie on the front of your body to sleep. WHAT SHOULD I KNOW ABOUT LIFTING, TWISTING, AND REACHING? Lifting and Heavy Lifting  Avoid heavy lifting, especially repetitive heavy lifting. If you must do heavy lifting:  Stretch before lifting.  Work slowly.  Rest between lifts.  Use a tool such as a cart or a dolly to move objects if one is available.  Make several small trips instead of carrying one heavy load.  Ask for help when you need it, especially when moving big objects.  Follow these steps when lifting:  Stand with your feet shoulder-width apart.  Get as close to the object as you can. Do not try to pick up a heavy object that is far from your body.  Use handles or lifting straps if they are available.  Bend at your knees. Squat down, but keep your heels off the floor.  heavy object that is far from your body.   Use handles or lifting straps if they are available.   Bend at your knees. Squat down, but keep your heels off the floor.   Keep your shoulders pulled back, your chin tucked in, and your back straight.   Lift the object slowly while you tighten the muscles in your legs, abdomen, and buttocks. Keep the object as close to the center of your body as possible.   Follow these steps when putting down a heavy load:   Stand with your feet shoulder-width apart.   Lower the object slowly while you tighten the muscles in your legs, abdomen, and buttocks. Keep the object as close to the center of your body as possible.   Keep your shoulders pulled back, your chin tucked in, and your back straight.   Bend at your knees. Squat  down, but keep your heels off the floor.   Use handles or lifting straps if they are available.  Twisting and Reaching   Avoid lifting heavy objects above your waist.   Do not twist at your waist while you are lifting or carrying a load. If you need to turn, move your feet.   Do not bend over without bending at your knees.   Avoid reaching over your head, across a table, or for an object on a high surface.  WHAT ARE SOME OTHER TIPS?   Avoid wet floors and icy ground. Keep sidewalks clear of ice to prevent falls.   Do not sleep on a mattress that is too soft or too hard.   Keep items that are used frequently within easy reach.   Put heavier objects on shelves at waist level, and put lighter objects on lower or higher shelves.   Find ways to decrease your stress, such as exercise, massage, or relaxation techniques. Stress can build up in your muscles. Tense muscles are more vulnerable to injury.   Talk with your health care provider if you feel anxious or depressed. These conditions can make back pain worse.   Wear flat heel shoes with cushioned soles.   Avoid sudden movements.   Use both shoulder straps when carrying a backpack.   Do not use any tobacco products, including cigarettes, chewing tobacco, or electronic cigarettes. If you need help quitting, ask your health care provider.    This information is not intended to replace advice given to you by your health care provider. Make sure you discuss any questions you have with your health care provider.    Document Released: 07/11/2004 Document Revised: 10/18/2014 Document Reviewed: 06/07/2014  Elsevier Interactive Patient Education 2016 [employer]        Back Pain, Adult  Back pain is very common in adults.The cause of back pain is rarely dangerous and the pain often gets better over time.The cause of your back pain may not be known. Some common causes of back pain include:   Strain of the muscles or ligaments supporting the spine.   Wear  and tear (degeneration) of the spinal disks.   Arthritis.        Direct injury to the back.  For many people, back pain may return. Since back pain is rarely dangerous, most people can learn to manage this condition on their own.  HOME CARE INSTRUCTIONS  Watch your back pain for any changes. The following actions may help to lessen any discomfort you are feeling:   Remain active. It is stressful on your back to   sit or stand in one place for long periods of time. Do not sit, drive, or stand in one place for more than 30 minutes at a time. Take short walks on even surfaces as soon as you are able.Try to increase the length of time you walk each day.   Exercise regularly as directed by your health care provider. Exercise helps your back heal faster. It also helps avoid future injury by keeping your muscles strong and flexible.   Do not stay in bed.Resting more than 1-2 days can delay your recovery.   Pay attention to your body when you bend and lift. The most comfortable positions are those that put less stress on your recovering back. Always use proper lifting techniques, including:   Bending your knees.   Keeping the load close to your body.   Avoiding twisting.   Find a comfortable position to sleep. Use a firm mattress and lie on your side with your knees slightly bent. If you lie on your back, put a pillow under your knees.   Avoid feeling anxious or stressed.Stress increases muscle tension and can worsen back pain.It is important to recognize when you are anxious or stressed and learn ways to manage it, such as with exercise.   Take medicines only as directed by your health care provider. Over-the-counter medicines to reduce pain and inflammation are often the most helpful.Your health care provider may prescribe muscle relaxant drugs.These medicines help dull your pain so you can more quickly return to your normal activities and healthy exercise.   Apply ice to the injured area:   Put ice in  a plastic bag.   Place a towel between your skin and the bag.   Leave the ice on for 20 minutes, 2-3 times a day for the first 2-3 days. After that, ice and heat may be alternated to reduce pain and spasms.   Maintain a healthy weight. Excess weight puts extra stress on your back and makes it difficult to maintain good posture.  SEEK MEDICAL CARE IF:   You have pain that is not relieved with rest or medicine.   You have increasing pain going down into the legs or buttocks.   You have pain that does not improve in one week.   You have night pain.   You lose weight.   You have a fever or chills.  SEEK IMMEDIATE MEDICAL CARE IF:    You develop new bowel or bladder control problems.   You have unusual weakness or numbness in your arms or legs.   You develop nausea or vomiting.   You develop abdominal pain.   You feel faint.    This information is not intended to replace advice given to you by your health care provider. Make sure you discuss any questions you have with your health care provider.    Document Released: 06/03/2005 Document Revised: 06/24/2014 Document Reviewed: 10/05/2013  Elsevier Interactive Patient Education 2016 [employer] Inc[employer]

## 2016-04-09 ENCOUNTER — Emergency Department (HOSPITAL_COMMUNITY)
Admission: EM | Admit: 2016-04-09 | Discharge: 2016-04-09 | Disposition: A | Discharge status: Home / Self Care | Payer: BLUE CROSS/BLUE SHIELD | Attending: Emergency Medicine | Admitting: Emergency Medicine

## 2016-04-09 ENCOUNTER — Encounter (HOSPITAL_COMMUNITY): Payer: Self-pay | Admitting: *Deleted

## 2016-04-09 DIAGNOSIS — R112 Nausea with vomiting, unspecified: Secondary | ICD-10-CM | POA: Diagnosis not present

## 2016-04-09 DIAGNOSIS — F17210 Nicotine dependence, cigarettes, uncomplicated: Secondary | ICD-10-CM | POA: Insufficient documentation

## 2016-04-09 LAB — COMPREHENSIVE METABOLIC PANEL
ALBUMIN: 4.2 g/dL (ref 3.5–5.0)
ALK PHOS: 54 U/L (ref 38–126)
ALT: 17 U/L (ref 14–54)
AST: 15 U/L (ref 15–41)
Anion gap: 6 (ref 5–15)
BUN: 6 mg/dL (ref 6–20)
CALCIUM: 9.8 mg/dL (ref 8.9–10.3)
CO2: 27 mmol/L (ref 22–32)
CREATININE: 0.84 mg/dL (ref 0.44–1.00)
Chloride: 107 mmol/L (ref 101–111)
GFR calc Af Amer: >60 mL/min (ref >60)
GFR calc non Af Amer: >60 mL/min (ref >60)
GLUCOSE: 109 mg/dL — AB (ref 65–99)
Potassium: 4.1 mmol/L (ref 3.5–5.1)
SODIUM: 140 mmol/L (ref 135–145)
Total Bilirubin: 0.5 mg/dL (ref 0.3–1.2)
Total Protein: 6.6 g/dL (ref 6.5–8.1)

## 2016-04-09 LAB — CBC
HCT: 40.8 % (ref 36.0–46.0)
Hemoglobin: 13.6 g/dL (ref 12.0–15.0)
MCH: 30 pg (ref 26.0–34.0)
MCHC: 33.3 g/dL (ref 30.0–36.0)
MCV: 89.9 fL (ref 78.0–100.0)
PLATELETS: 266 10*3/uL (ref 150–400)
RBC: 4.54 MIL/uL (ref 3.87–5.11)
RDW: 13.5 % (ref 11.5–15.5)
WBC: 12.1 10*3/uL — ABNORMAL HIGH (ref 4.0–10.5)

## 2016-04-09 LAB — I-STAT BETA HCG BLOOD, ED (MC, WL, AP ONLY)

## 2016-04-09 LAB — URINALYSIS, ROUTINE W REFLEX MICROSCOPIC
BILIRUBIN URINE: NEGATIVE
Glucose, UA: NEGATIVE mg/dL
HGB URINE DIPSTICK: NEGATIVE
Ketones, ur: NEGATIVE mg/dL
Leukocytes, UA: NEGATIVE
Nitrite: NEGATIVE
PROTEIN: NEGATIVE mg/dL
SPECIFIC GRAVITY, URINE: 1.019 (ref 1.005–1.030)
pH: 6.5 (ref 5.0–8.0)

## 2016-04-09 LAB — LIPASE, BLOOD: Lipase: 33 U/L (ref 11–51)

## 2016-04-09 LAB — POC URINE PREG, ED: Preg Test, Ur: NEGATIVE

## 2016-04-09 MED ORDER — ONDANSETRON 4 MG PO TBDP
4.0000 mg | ORAL_TABLET | Freq: Once | ORAL | Status: AC | PRN
  Administered 2016-04-09: 4 mg via ORAL

## 2016-04-09 MED ORDER — ONDANSETRON 4 MG PO TBDP: 4.0000 mg | ORAL_TABLET | Freq: Three times a day (TID) | ORAL | 0 refills | Status: DC | PRN

## 2016-04-09 MED ORDER — ONDANSETRON 4 MG PO TBDP
ORAL_TABLET | ORAL | Status: AC
  Filled 2016-04-09: qty 1

## 2016-04-09 NOTE — ED Notes (Signed)
Pt was not able to sign

## 2016-04-09 NOTE — ED Provider Notes (Signed)
[facility]-EM[facility]Y DEPT Provider Note   CSN: [id number] Arrival date & time: 04/09/16  1803     History   Chief Complaint Chief Complaint  Patient presents with  . Nausea  . Emesis    HPI Maureen Perez is a 30 y.o. female.  30 yo F with a chief complaint of nausea and vomiting. This been going on for about a week. Denies diarrhea denies fevers. Denies abdominal pain. No history of this before. Denies any chest pain shortness of breath. Symptoms are worse after eating. Has been able to tolerate liquids without difficulty. Was given Zofran in triage and feels much better.   The history is provided by the patient.  Emesis   This is a new problem. The current episode started more than 1 week ago. The problem occurs 2 to 4 times per day. The problem has not changed since onset.The emesis has an appearance of stomach contents. There has been no fever. Pertinent negatives include no arthralgias, no chills, no fever, no headaches and no myalgias.    Past Medical History:  Diagnosis Date  . Abnormal Pap smear    repeat WNL  . Back pain   . Cholelithiasis   . Depression    no meds since pregancny  . Family history of adverse reaction to anesthesia    " MOTHER TAKES ALONG TIME TO WAKE "  . Infection    UTI  . Injury of tendon of left rotator cuff   . Obesity   . PONV (postoperative nausea and vomiting)     Patient Active Problem List   Diagnosis Date Noted  . Acute cholecystitis 12/05/2014  . RUQ pain 08/26/2014  . Chest pain 08/18/2013  . SGA (small for gestational age) 08/09/2013  . Fetus SGA (small for gestational age), antepartum 07/07/2013  . GERD (gastroesophageal reflux disease) 06/15/2013  . Smoking complicating pregnancy, antepartum 01/12/2013  . Rubella non-immune status, antepartum 12/24/2012  . Pap smear abnormality of cervix with LGSIL 10/07/2011  . Supervision of high-risk pregnancy 02/26/2011  . Lymphadenitis 09/11/2010  . DEPRESSION, MODERATE, RECURRENT  03/20/2010  . 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 INSOMNIA 01/06/2009  . TOBACCO ABUSE 11/22/2008  . ALLERGIC RHINITIS DUE TO ANIMAL HAIR AND DANDER 11/22/2008  . OBESITY 10/20/2008    Past Surgical History:  Procedure Laterality Date  . CHOLECYSTECTOMY N/A 12/05/2014   Procedure: LAPAROSCOPIC CHOLECYSTECTOMY WITH INTRAOPERATIVE CHOLANGIOGRAM;  Surgeon: Chevis Pretty III, MD;  Location: [facility] OR;  Service: General;  Laterality: N/A;  . TONSILLECTOMY     age 42  . tubes in ears  age 67    OB History    Gravida Para Term Preterm AB Living   4 2 2  0 2 2   SAB TAB Ectopic Multiple Live Births   2 0 0 0 2       Home Medications    Prior to Admission medications   Medication Sig Start Date End Date Taking? Authorizing Provider  acetaminophen (TYLENOL) 325 MG tablet Take 650 mg by mouth every 6 (six) hours as needed for mild pain.   Yes Historical Provider, MD  Multiple Vitamin (MULTIVITAMIN WITH MINERALS) TABS tablet Take 1 tablet by mouth daily.   Yes Historical Provider, MD  cyclobenzaprine (FLEXERIL) 10 MG tablet Take 1 tablet (10 mg total) by mouth 2 (two) times daily as needed for muscle spasms. Patient not taking: Reported on 04/09/2016 03/20/16   Duane Lope, NP  ibuprofen (ADVIL,MOTRIN) 600 MG tablet Take 1 tablet (600  mg total) by mouth every 6 (six) hours as needed. Patient not taking: Reported on 04/09/2016 03/20/16   Duane Lope, NP  ondansetron (ZOFRAN ODT) 4 MG disintegrating tablet Take 1 tablet (4 mg total) by mouth every 8 (eight) hours as needed for nausea or vomiting. 04/09/16   Melene Plan, DO  sulfamethoxazole-trimethoprim (BACTRIM DS,SEPTRA DS) 800-160 MG tablet Take 1 tablet by mouth 2 (two) times daily. Patient not taking: Reported on 04/09/2016 02/22/16   Linwood Dibbles, MD    Family History Family History  Problem Relation Age of Onset  . Depression Mother   . Diabetes Mother   . Hypertension Mother   . COPD Mother   . Anesthesia problems Mother   .  Depression Sister   . Diabetes Sister   . Kidney disease Sister   . Hypertension Sister   . Hypertension Father   . Anesthesia problems Father   . COPD Father     Social History Social History  Substance Use Topics  . Smoking status: Current Some Day Smoker    Packs/day: 0.50    Years: 10.00    Types: Cigarettes  . Smokeless tobacco: Never Used  . Alcohol use No     Allergies   Cephalexin and Percocet [oxycodone-acetaminophen]   Review of Systems Review of Systems  Constitutional: Negative for chills and fever.  HENT: Negative for congestion and rhinorrhea.   Eyes: Negative for redness and visual disturbance.  Respiratory: Negative for shortness of breath and wheezing.   Cardiovascular: Negative for chest pain and palpitations.  Gastrointestinal: Positive for nausea and vomiting.  Genitourinary: Negative for dysuria and urgency.  Musculoskeletal: Negative for arthralgias and myalgias.  Skin: Negative for pallor and wound.  Neurological: Negative for dizziness and headaches.     Physical Exam Updated Vital Signs BP 113/80   Pulse (!) 58   Temp 97.9 F (36.6 C) (Oral)   Resp 18   LMP 03/24/2016   SpO2 100%   Physical Exam  Constitutional: She is oriented to person, place, and time. She appears well-developed and well-nourished. No distress.  HENT:  Head: Normocephalic and atraumatic.  Eyes: EOM are normal. Pupils are equal, round, and reactive to light.  Neck: Normal range of motion. Neck supple.  Cardiovascular: Normal rate and regular rhythm.  Exam reveals no gallop and no friction rub.   No murmur heard. Pulmonary/Chest: Effort normal. She has no wheezes. She has no rales.  Abdominal: Soft. She exhibits no distension and no mass. There is no tenderness. There is no guarding.  Musculoskeletal: She exhibits no edema or tenderness.  Neurological: She is alert and oriented to person, place, and time.  Skin: Skin is warm and dry. She is not diaphoretic.    Psychiatric: She has a normal mood and affect. Her behavior is normal.  Nursing note and vitals reviewed.    ED Treatments / Results  Labs (all labs ordered are listed, but only abnormal results are displayed) Labs Reviewed  COMPREHENSIVE METABOLIC PANEL - Abnormal; Notable for the following:       Result Value   Glucose, Bld 109 (*)    All other components within normal limits  CBC - Abnormal; Notable for the following:    WBC 12.1 (*)    All other components within normal limits  URINALYSIS, ROUTINE W REFLEX MICROSCOPIC (NOT AT [facility]) - Abnormal; Notable for the following:    APPearance HAZY (*)    All other components within normal limits  LIPASE, BLOOD  I-STAT BETA HCG BLOOD, ED ([facility], [facility], [facility] ONLY)  POC URINE PREG, ED    EKG  EKG Interpretation None       Radiology No results found.  Procedures Procedures (including critical care time)  Medications Ordered in ED Medications  ondansetron (ZOFRAN-ODT) disintegrating tablet 4 mg (4 mg Oral Given 04/09/16 1827)     Initial Impression / Assessment and Plan / ED Course  I have reviewed the triage vital signs and the nursing notes.  Pertinent labs & imaging results that were available during my care of the patient were reviewed by me and considered in my medical decision making (see chart for details).  Clinical Course    30 yo F With a chief complaint of nausea and vomiting. Symptoms are improved with Zofran. Labs were done in triage and are unremarkable. Do not feel the patient needs IV fluids at this time. Discharge home.  11:59 PM:  I have discussed the diagnosis/risks/treatment options with the patient and believe the pt to be eligible for discharge home to follow-up with PCP. We also discussed returning to the ED immediately if new or worsening sx occur. We discussed the sx which are most concerning (e.g., sudden worsening pain, fever, inability to tolerate by mouth) that necessitate immediate return.  Medications administered to the patient during their visit and any new prescriptions provided to the patient are listed below.  Medications given during this visit Medications  ondansetron (ZOFRAN-ODT) disintegrating tablet 4 mg (4 mg Oral Given 04/09/16 1827)     The patient appears reasonably screen and/or stabilized for discharge and I doubt any other medical condition or other [facility] requiring further screening, evaluation, or treatment in the ED at this time prior to discharge.    Final Clinical Impressions(s) / ED Diagnoses   Final diagnoses:  Non-intractable vomiting with nausea, unspecified vomiting type    New Prescriptions Discharge Medication List as of 04/09/2016 10:54 PM    START taking these medications   Details  ondansetron (ZOFRAN ODT) 4 MG disintegrating tablet Take 1 tablet (4 mg total) by mouth every 8 (eight) hours as needed for nausea or vomiting., Starting Tue 04/09/2016, Print         Melene Planan Audra Bellard, DO 04/09/16 2359

## 2016-04-09 NOTE — ED Triage Notes (Signed)
Pt c/o n/v for a week. Pt denies abdominal pain, diarrhea, fever at home.

## 2016-04-09 NOTE — Discharge Instructions (Signed)
Try a bland diet, like BRAT - bananas applesauce rice and toast. As you are able to eat this work up to soups and then solid food.

## 2016-04-09 NOTE — ED Notes (Signed)
Pt verbalized understanding discharge instructions and denies any further needs or questions at this time. VS stable, ambulatory and steady gait.   

## 2016-04-17 ENCOUNTER — Encounter (HOSPITAL_COMMUNITY): Payer: Self-pay

## 2016-04-17 ENCOUNTER — Inpatient Hospital Stay (HOSPITAL_COMMUNITY)
Admission: AD | Admit: 2016-04-17 | Discharge: 2016-04-17 | Disposition: A | Discharge status: Home / Self Care | Payer: BLUE CROSS/BLUE SHIELD | Source: Ambulatory Visit | Attending: Obstetrics & Gynecology | Admitting: Obstetrics & Gynecology

## 2016-04-17 DIAGNOSIS — M549 Dorsalgia, unspecified: Secondary | ICD-10-CM | POA: Diagnosis not present

## 2016-04-17 DIAGNOSIS — Z3202 Encounter for pregnancy test, result negative: Secondary | ICD-10-CM | POA: Insufficient documentation

## 2016-04-17 DIAGNOSIS — F17210 Nicotine dependence, cigarettes, uncomplicated: Secondary | ICD-10-CM | POA: Diagnosis not present

## 2016-04-17 DIAGNOSIS — G8929 Other chronic pain: Secondary | ICD-10-CM | POA: Diagnosis not present

## 2016-04-17 LAB — URINE MICROSCOPIC-ADD ON: WBC, UA: NONE SEEN WBC/hpf (ref 0–5)

## 2016-04-17 LAB — URINALYSIS, ROUTINE W REFLEX MICROSCOPIC
BILIRUBIN URINE: NEGATIVE
Glucose, UA: NEGATIVE mg/dL
Ketones, ur: 15 mg/dL — AB
Leukocytes, UA: NEGATIVE
Nitrite: NEGATIVE
PH: 5 (ref 5.0–8.0)
Protein, ur: NEGATIVE mg/dL

## 2016-04-17 LAB — POCT PREGNANCY, URINE: Preg Test, Ur: NEGATIVE

## 2016-04-17 MED ORDER — CYCLOBENZAPRINE HCL 10 MG PO TABS
10.0000 mg | ORAL_TABLET | Freq: Once | ORAL | Status: AC
  Administered 2016-04-17: 10 mg via ORAL
  Filled 2016-04-17: qty 1

## 2016-04-17 MED ORDER — CYCLOBENZAPRINE HCL 10 MG PO TABS: 10.0000 mg | ORAL_TABLET | Freq: Two times a day (BID) | ORAL | 0 refills | Status: DC | PRN

## 2016-04-17 MED ORDER — KETOROLAC TROMETHAMINE 60 MG/2ML IM SOLN
60.0000 mg | Freq: Once | INTRAMUSCULAR | Status: AC
Start: 2016-04-17 — End: 2016-04-17
  Administered 2016-04-17: 60 mg via INTRAMUSCULAR
  Filled 2016-04-17: qty 2

## 2016-04-17 NOTE — MAU Note (Signed)
Pt presents to MAU with complaints of vomiting and lower back pain for a week. Pt states she was evaluated last week and has not gotten any better

## 2016-04-17 NOTE — Discharge Instructions (Signed)
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
## 2016-05-02 ENCOUNTER — Emergency Department (HOSPITAL_COMMUNITY)
Admission: EM | Admit: 2016-05-02 | Discharge: 2016-05-03 | Disposition: A | Discharge status: Home / Self Care | Payer: BLUE CROSS/BLUE SHIELD | Attending: Emergency Medicine | Admitting: Emergency Medicine

## 2016-05-02 ENCOUNTER — Encounter (HOSPITAL_COMMUNITY): Payer: Self-pay | Admitting: *Deleted

## 2016-05-02 ENCOUNTER — Emergency Department (HOSPITAL_COMMUNITY): Payer: BLUE CROSS/BLUE SHIELD

## 2016-05-02 DIAGNOSIS — F17210 Nicotine dependence, cigarettes, uncomplicated: Secondary | ICD-10-CM | POA: Insufficient documentation

## 2016-05-02 DIAGNOSIS — R0789 Other chest pain: Secondary | ICD-10-CM

## 2016-05-02 DIAGNOSIS — R079 Chest pain, unspecified: Secondary | ICD-10-CM | POA: Diagnosis present

## 2016-05-02 LAB — CBC
HEMATOCRIT: 42 % (ref 36.0–46.0)
HEMOGLOBIN: 13.7 g/dL (ref 12.0–15.0)
MCH: 29.5 pg (ref 26.0–34.0)
MCHC: 32.6 g/dL (ref 30.0–36.0)
MCV: 90.3 fL (ref 78.0–100.0)
Platelets: 269 10*3/uL (ref 150–400)
RBC: 4.65 MIL/uL (ref 3.87–5.11)
RDW: 13.7 % (ref 11.5–15.5)
WBC: 11.1 10*3/uL — ABNORMAL HIGH (ref 4.0–10.5)

## 2016-05-02 LAB — BASIC METABOLIC PANEL
Anion gap: 7 (ref 5–15)
BUN: 7 mg/dL (ref 6–20)
CO2: 26 mmol/L (ref 22–32)
Calcium: 9.6 mg/dL (ref 8.9–10.3)
Chloride: 106 mmol/L (ref 101–111)
Creatinine, Ser: 0.79 mg/dL (ref 0.44–1.00)
GFR calc Af Amer: >60 mL/min (ref >60)
GLUCOSE: 102 mg/dL — AB (ref 65–99)
POTASSIUM: 3.8 mmol/L (ref 3.5–5.1)
Sodium: 139 mmol/L (ref 135–145)

## 2016-05-02 LAB — I-STAT TROPONIN, ED: Troponin i, poc: 0 ng/mL (ref 0.00–0.08)

## 2016-05-02 NOTE — ED Provider Notes (Signed)
[facility] Provider Note   CSN: [id number] Arrival date & time: 05/02/16  1931  History   Chief Complaint Chief Complaint  Patient presents with  . Chest Pain    HPI Maureen Perez is a 30 y.o. female.  HPI   Patient with PMH of back pain, abnormal pap smear, cholelithiasis, depression, UTI, obesity, hx of acute cholecystitis, RUQ, CP, and insomnia comes to the ER with complaints of an episode of feeling flushed, chest pain, SOB and high BP while at work. She reports her job is very stressful and while at work she became upset. She went home and checked her BP and it was elevated to 160/94, the pain stopped after she laid down and her boyfriend calmed her down, they talked about Christmas and "non work things". Patient has not had any reoccurance of these symptoms since then. No le swelling, no fevers, no n/v/d, or weakness.   Past Medical History:  Diagnosis Date  . Abnormal Pap smear    repeat WNL  . Back pain   . Cholelithiasis   . Depression    no meds since pregancny  . Family history of adverse reaction to anesthesia    " MOTHER TAKES ALONG TIME TO WAKE "  . Infection    UTI  . Injury of tendon of left rotator cuff   . Obesity   . PONV (postoperative nausea and vomiting)     Patient Active Problem List   Diagnosis Date Noted  . Acute cholecystitis 12/05/2014  . RUQ pain 08/26/2014  . Chest pain 08/18/2013  . SGA (small for gestational age) 08/09/2013  . Fetus SGA (small for gestational age), antepartum 07/07/2013  . GERD (gastroesophageal reflux disease) 06/15/2013  . Smoking complicating pregnancy, antepartum 01/12/2013  . Rubella non-immune status, antepartum 12/24/2012  . Pap smear abnormality of cervix with LGSIL 10/07/2011  . Supervision of high-risk pregnancy 02/26/2011  . Lymphadenitis 09/11/2010  . DEPRESSION, MODERATE, RECURRENT 03/20/2010  . 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 INSOMNIA 01/06/2009  . TOBACCO ABUSE 11/22/2008  .  ALLERGIC RHINITIS DUE TO ANIMAL HAIR AND DANDER 11/22/2008  . OBESITY 10/20/2008    Past Surgical History:  Procedure Laterality Date  . CHOLECYSTECTOMY N/A 12/05/2014   Procedure: LAPAROSCOPIC CHOLECYSTECTOMY WITH INTRAOPERATIVE CHOLANGIOGRAM;  Surgeon: Chevis Pretty III, MD;  Location: [facility] OR;  Service: General;  Laterality: N/A;  . TONSILLECTOMY     age 15  . tubes in ears  age 20    OB History    Gravida Para Term Preterm AB Living   4 2 2  0 2 2   SAB TAB Ectopic Multiple Live Births   2 0 0 0 2       Home Medications    Prior to Admission medications   Medication Sig Start Date End Date Taking? Authorizing Provider  cyclobenzaprine (FLEXERIL) 10 MG tablet Take 1 tablet (10 mg total) by mouth 2 (two) times daily as needed for muscle spasms. Patient not taking: Reported on 05/02/2016 04/17/16   Duane Lope, NP  ondansetron (ZOFRAN ODT) 4 MG disintegrating tablet Take 1 tablet (4 mg total) by mouth every 8 (eight) hours as needed for nausea or vomiting. Patient not taking: Reported on 05/02/2016 04/09/16   Melene Plan, DO    Family History Family History  Problem Relation Age of Onset  . Depression Mother   . Diabetes Mother   . Hypertension Mother   . COPD Mother   . Anesthesia problems Mother   .  Depression Sister   . Diabetes Sister   . Kidney disease Sister   . Hypertension Sister   . Hypertension Father   . Anesthesia problems Father   . COPD Father     Social History Social History  Substance Use Topics  . Smoking status: Current Every Day Smoker    Packs/day: 0.50    Years: 10.00    Types: Cigarettes  . Smokeless tobacco: Never Used  . Alcohol use No     Allergies   Cephalexin and Percocet [oxycodone-acetaminophen]   Review of Systems Review of Systems Review of Systems All other systems negative except as documented in the HPI. All pertinent positives and negatives as reviewed in the HPI.   Physical Exam Updated Vital Signs BP 128/88    Pulse 72   Temp 98.2 F (36.8 C)   Resp 13   Ht 5\' 1"  (1.549 m)   Wt 69.9 kg   LMP 04/24/2016   SpO2 100%   BMI 29.10 kg/m   Physical Exam  Constitutional: She appears well-developed and well-nourished.  HENT:  Head: Normocephalic and atraumatic.  Eyes: Conjunctivae are normal. Pupils are equal, round, and reactive to light.  Neck: Trachea normal, normal range of motion and full passive range of motion without pain. Neck supple.  Cardiovascular: Normal rate, regular rhythm and normal pulses.   Pulmonary/Chest: Effort normal and breath sounds normal. Chest wall is not dull to percussion. She exhibits no tenderness, no crepitus, no edema, no deformity and no retraction.  Abdominal: Soft. Normal appearance and bowel sounds are normal.  Musculoskeletal: Normal range of motion.  Neurological: She is alert. She has normal strength.  Skin: Skin is warm, dry and intact.  Psychiatric: She has a normal mood and affect. Her speech is normal and behavior is normal. Judgment and thought content normal. Cognition and memory are normal.     ED Treatments / Results  Labs (all labs ordered are listed, but only abnormal results are displayed) Labs Reviewed  BASIC METABOLIC PANEL - Abnormal; Notable for the following:       Result Value   Glucose, Bld 102 (*)    All other components within normal limits  CBC - Abnormal; Notable for the following:    WBC 11.1 (*)    All other components within normal limits  I-STAT TROPOININ, ED    EKG  EKG Interpretation None       Radiology Dg Chest 2 View  Result Date: 05/02/2016 CLINICAL DATA:  Central chest pain and pain on inspiration. EXAM: CHEST  2 VIEW COMPARISON:  None. FINDINGS: The heart size and mediastinal contours are within normal limits. Both lungs are clear. No pneumothorax or effusion. No acute osseous abnormality. Cholecystectomy clips are seen in the right upper quadrant. IMPRESSION: No active cardiopulmonary disease.  Electronically Signed   By: Tollie Ethavid  Kwon [name]   On: 05/02/2016 20:18    Procedures Procedures (including critical care time)  Medications Ordered in ED Medications - No data to display   Initial Impression / Assessment and Plan / ED Course  I have reviewed the triage vital signs and the nursing notes.  Pertinent labs & imaging results that were available during my care of the patient were reviewed by me and considered in my medical decision making (see chart for details).  Clinical Course     Normal EKG, chest pain, CBC, BMP, and trop are all normal. Her CP is very atypical, and is consistent with an anxiety attack. No SOB  or CP since the isolated event.   Pt has been advised to return to the ED if CP becomes exertional, associated with diaphoresis or nausea, radiates to left jaw/arm, worsens or becomes concerning in any way. Pt appears reliable for follow up and is agreeable to discharge.     Final Clinical Impressions(s) / ED Diagnoses   Final diagnoses:  Atypical chest pain    New Prescriptions New Prescriptions   No medications on file     Marlon Peliffany Emarie Paul, PA-C 05/03/16 0013    Layla MawKristen N Ward, DO 05/03/16 0221

## 2016-05-02 NOTE — ED Triage Notes (Signed)
Pt has been having a "flushed" feeling all week. Started having centralized chest pain without radiation today. Pt was at her parents house and decided to check her blood pressure, reading was 160/94. Pt denies hx of hypertension.

## 2016-05-03 NOTE — ED Notes (Signed)
Patient Alert and oriented X4. Stable and ambulatory. Patient verbalized understanding of the discharge instructions.  Patient belongings were taken by the patient.  

## 2016-05-07 ENCOUNTER — Encounter: Payer: Self-pay | Admitting: Student

## 2016-05-07 ENCOUNTER — Ambulatory Visit (INDEPENDENT_AMBULATORY_CARE_PROVIDER_SITE_OTHER): Payer: BLUE CROSS/BLUE SHIELD | Admitting: Student

## 2016-05-07 VITALS — BP 110/70 | HR 77 | Temp 97.9°F | Ht 61.0 in | Wt 159.0 lb

## 2016-05-07 DIAGNOSIS — Z23 Encounter for immunization: Secondary | ICD-10-CM | POA: Diagnosis not present

## 2016-05-07 DIAGNOSIS — B9789 Other viral agents as the cause of diseases classified elsewhere: Secondary | ICD-10-CM | POA: Diagnosis not present

## 2016-05-07 DIAGNOSIS — F17200 Nicotine dependence, unspecified, uncomplicated: Secondary | ICD-10-CM | POA: Diagnosis not present

## 2016-05-07 DIAGNOSIS — J069 Acute upper respiratory infection, unspecified: Secondary | ICD-10-CM | POA: Diagnosis not present

## 2016-05-07 NOTE — Assessment & Plan Note (Signed)
History and exam suggestive for viral URI. Discussed about conservative management and return precaution as in AVS.

## 2016-05-07 NOTE — Patient Instructions (Signed)
It was great seeing you today! We have addressed the following issues today  Cough and congestion: this is likely due to common cold,  which is a viral infection.  Cold symptoms can last up to 2 weeks.    - Get plenty of rest and adequate hydration. - Consume warm fluids (soup or tea) to provide relief for a stuffy nose and to loosen phlegm. - For nasal stuffiness, try saline nasal spray or a Neti Pot. - For sore throat pain relief: suck on throat lozenges, hard candy or popsicles; gargle with warm salt water (1/4 tsp. salt per 8 oz. of water); and eat soft, bland foods. - Eat a well-balanced diet. If you cannot, ensure you are getting enough nutrients by taking a daily multivitamin. - Avoid dairy products, as they can thicken phlegm. - Avoid alcohol, as it impairs your body's immune system.  CONTACT YOUR DOCTOR IF YOU EXPERIENCE ANY OF THE FOLLOWING: - High fever, chest pain, shortness of breath or  not able to keep down food or fluids.  - Cough that gets worse while other cold symptoms improve - Your symptoms persist longer than 2 weeks  Smoking: I strongly recommend you quit smoking. If you need help on this, please come back and see your primary care doctor. You can alsoCall 1800-QUIT-NOW to talk to professional counselors for help with stopping smoking.   Please bring all your medications to every doctors visit   Please check-out at the front desk before leaving the clinic.    Take Care,

## 2016-05-07 NOTE — Progress Notes (Signed)
   Subjective:    Patient ID: Maureen Perez is a 30 y.o. old female.  HPI #Cough and congestion:  for two days. Reports runny nose and sore throat. Cough is productive with greenish phlegm. Denies hemoptysis, fever, chills, shortness of breath, chest pain or skin rash. Denies nausea, emesis or diarrhea. Denies sick contact. Hasn't had flu vaccine this morning  PMH: has history of seasonal allergy. No history of asthma  SH: reports smoking about a pack a day. Denies EtOH. Denies drugs.   Review of Systems Per HPI Objective:   Vitals:   05/07/16 1622  BP: 110/70  Pulse: 77  Temp: 97.9 F (36.6 C)  TempSrc: Oral  SpO2: 98%  Weight: 159 lb (72.1 kg)  Height: 5\' 1"  (1.549 m)    GEN: appears well, no apparent distress. Head: normocephalic and atraumatic. Transillumination test negative.  Eyes: without conjunctival injection, sclera anicteric Ears: normal TM and ear canal,  Nares:  Some rhinorrhea, congestion or erythema Oropharynx: mmm without erythema or exudation, very poor denitition HEM: no cervical LAD CVS: RRR, normal s1 and s2, no murmurs, no edema RESP: no increased work of breathing, good air movement bilaterally, no crackles or wheeze SKIN: no apparent skin lesion NEURO: alert and oriented appropriately, no gross defecits  PSYCH: appropriate mood and affect     Assessment & Plan:  Viral URI with cough History and exam suggestive for viral URI. Discussed about conservative management and return precaution as in AVS.   TOBACCO ABUSE Emphasized the importance of quitting smoking and advised her to quit. Gave her quit line.   Received her flu vaccine today

## 2016-05-07 NOTE — Assessment & Plan Note (Signed)
Emphasized the importance of quitting smoking and advised her to quit. Gave her quit line.

## 2016-05-17 ENCOUNTER — Encounter: Payer: Self-pay | Admitting: Family Medicine

## 2016-05-17 ENCOUNTER — Ambulatory Visit: Payer: BLUE CROSS/BLUE SHIELD | Admitting: Internal Medicine

## 2016-05-17 ENCOUNTER — Ambulatory Visit (INDEPENDENT_AMBULATORY_CARE_PROVIDER_SITE_OTHER): Payer: BLUE CROSS/BLUE SHIELD | Admitting: Family Medicine

## 2016-05-17 DIAGNOSIS — K0401 Reversible pulpitis: Secondary | ICD-10-CM | POA: Diagnosis not present

## 2016-05-17 MED ORDER — PENICILLIN V POTASSIUM 500 MG PO TABS: 500.0000 mg | ORAL_TABLET | Freq: Two times a day (BID) | ORAL | 0 refills | Status: AC

## 2016-05-17 MED ORDER — HYDROCODONE-ACETAMINOPHEN 5-325 MG PO TABS: 1.0000 | ORAL_TABLET | Freq: Four times a day (QID) | ORAL | 0 refills | Status: DC | PRN

## 2016-05-17 MED ORDER — NAPROXEN 250 MG PO TABS: 250.0000 mg | ORAL_TABLET | Freq: Two times a day (BID) | ORAL | 2 refills | Status: DC

## 2016-05-17 NOTE — Progress Notes (Signed)
   Subjective:    Patient ID: Maureen Perez , female   DOB: 10/30/1985 , 30 y.o..   MRN: [id number]  HPI  Maureen Perez is here for dental pain.  Patient is presenting today for dental pain. She notes that about 2 weeks ago she was eating a heart basket and her top molar broke. She was able to take out a small piece of her tooth. Since then her tooth has been very painful. She denies any discharge from her gums or bleeding. She notes that it is very painful all the time and the pain does not go away. She has tried Tylenol for the pain but it does not seem to help. Chewing makes her pain worse. She denies any fevers, chills, chest pain.   Review of Systems: Per HPI. All other systems reviewed and are negative.  There are no preventive care reminders to display for this patient.  Past Medical History: Patient Active Problem List   Diagnosis Date Noted  . 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 Viral URI with cough 05/07/2016  . Acute cholecystitis 12/05/2014  . GERD (gastroesophageal reflux disease) 06/15/2013  . Smoking complicating pregnancy, antepartum 01/12/2013  . Lymphadenitis 09/11/2010  . DEPRESSION, MODERATE, RECURRENT 03/20/2010  . 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 INSOMNIA 01/06/2009  . TOBACCO ABUSE 11/22/2008  . ALLERGIC RHINITIS DUE TO ANIMAL HAIR AND DANDER 11/22/2008  . OBESITY 10/20/2008     Social Hx:  reports that she has been smoking Cigarettes.  She has a 10.00 pack-year smoking history. She has never used smokeless tobacco.   Objective:   BP 125/84   Pulse 64   Temp 98.1 F (36.7 C) (Oral)   Wt 162 lb (73.5 kg)   LMP 04/24/2016   SpO2 100%   BMI 30.61 kg/m  Physical Exam  Constitutional: She appears well-developed and well-nourished.  HENT:  Head: Normocephalic and atraumatic.  Right Ear: External ear normal.  Left Ear: External ear normal.  Nose: Nose normal.  Mouth/Throat: Oropharynx is clear and moist. Abnormal dentition (very poor  dentition). Dental caries present.  Left molar on maxillary jaw cracked on posterior aspect. Slight gingival erythema surrounding the tooth.   Eyes: Conjunctivae and EOM are normal. Pupils are equal, round, and reactive to light.  Neck: Normal range of motion. Neck supple.  Cardiovascular: Normal rate and regular rhythm.   Pulmonary/Chest: Effort normal. No respiratory distress. She has no wheezes.  Neurological: She is alert.  Psychiatric: She has a normal mood and affect. Her behavior is normal.    Assessment & Plan:  Tooth pulpitis Patient has overall very poor dentition. The tooth that is hurting her is cracked and there is slight gingival erythema around the tooth. No signs of abscess. Difficulty finding dentist as patient states she has no insurance. Discussed patient with Dr. Jennette KettleNeal. -Will give 30 day course of PCN VK to give patient enough time to see a dentist.  - Naproxen 500 BID  - Norco 5-325 mg q 6 PRN for severe pain #10 pills prescribed - List of resources for dentist provided to patient   Anders Simmondshristina Gambino, MD [facility], PGY-2

## 2016-05-17 NOTE — Patient Instructions (Addendum)
Thank you for coming in today, it was so nice to see you! Today we talked about:    Tooth pain: You will need to see a dentist to fix her tooth. You may have a small tooth infection as well. We have given a prescription for an antibiotic as well as an anti-inflammatory medication which she should take every day. We have also prescribed to use Vicodin to take only when the pain becomes unbearable.  Dental Assistance:  If unable to pay or uninsured, contact: [facility] to become qualified for the adult dental clinic. Patient must be enrolled in [facility] (uninsured, 0-200% FPL, qualifying info).  Enroll in [facility] first, then see Primary Care Physician assigned to you, the PCP makes a dental referral. [facility] will receive referral and contacts patient for appointment.  Patients with Medicaid           [address], [phone]  [facility] (Children up to 20 + Pregnant Women) - [phone]  [facility] - [address] - [phone]  If unable to pay, or uninsured: contact [facility] [phone] in [place] - (Children only + Pregnant Women), [phone] in [facility] [place]- Children only) to become qualified for the adult dental clinic  Must see if eligible to enroll in [facility] Marketplace before enrolling into the [facility] (exemption required) [phone] for an appointment)  [email or web address];   [phone].  If not eligible for ACA, then go by Department of Health and Human Services to see if eligible for "orange card."  [address], [place] and [phone]- [address].  Once you get an orange card, you will have a Primary Care home who will then refer you to dental if needed.   Other [facility] Community Dental Services:   [facility] [phone] (ext [phone])   [address]  Dr. Lawrence Marseillesivils - [phone]   [address]    [facility] [facility] - [phone]   [address]           [facility]           [address], [place], [place], [address]           [phone], [phone]. [phone]           2nd and 4th Thursday of the month at 6:30am (Simple extractions only - no wisdom teeth or surgery) First come/First serve -First 10 clients served           [facility], [place] and [place] residents only)          [address], [place], [place], [address]           [phone]                    [facility]           [phone]          [facility]          [phone]         [facility]          [phone]   Transportation Options:  Ambulance - 911 - [phone] per ride Family Member to accompany patient (if stable) [facility]inette Otto- [place] [employer] - [phone]  PART - [phone]  Taxi - [phone] - [employer]  [employer] - [phone] (Application required)  [facility] - [phone]      If you have any questions or concerns, please do not hesitate to call the office at [phone]. You can also message me directly via MyChart.   Sincerely,  Anders Simmondshristina Petrice Beedy, MD

## 2016-05-19 DIAGNOSIS — K0401 Reversible pulpitis: Secondary | ICD-10-CM | POA: Insufficient documentation

## 2016-05-19 NOTE — Assessment & Plan Note (Signed)
Patient has overall very poor dentition. The tooth that is hurting her is cracked and there is slight gingival erythema around the tooth. No signs of abscess. Difficulty finding dentist as patient states she has no insurance. Discussed patient with Dr. Jennette KettleNeal. -Will give 30 day course of PCN VK to give patient enough time to see a dentist.  - Naproxen 500 BID  - Norco 5-325 mg q 6 PRN for severe pain #10 pills prescribed - List of resources for dentist provided to patient

## 2016-05-25 ENCOUNTER — Encounter (HOSPITAL_COMMUNITY): Payer: Self-pay

## 2016-05-25 ENCOUNTER — Inpatient Hospital Stay (HOSPITAL_COMMUNITY)
Admission: AD | Admit: 2016-05-25 | Discharge: 2016-05-25 | Disposition: A | Discharge status: Home / Self Care | Payer: BLUE CROSS/BLUE SHIELD | Source: Ambulatory Visit | Attending: Obstetrics & Gynecology | Admitting: Obstetrics & Gynecology

## 2016-05-25 DIAGNOSIS — E669 Obesity, unspecified: Secondary | ICD-10-CM | POA: Insufficient documentation

## 2016-05-25 DIAGNOSIS — Z818 Family history of other mental and behavioral disorders: Secondary | ICD-10-CM | POA: Insufficient documentation

## 2016-05-25 DIAGNOSIS — Z841 Family history of disorders of kidney and ureter: Secondary | ICD-10-CM | POA: Insufficient documentation

## 2016-05-25 DIAGNOSIS — N926 Irregular menstruation, unspecified: Secondary | ICD-10-CM | POA: Insufficient documentation

## 2016-05-25 DIAGNOSIS — Z8249 Family history of ischemic heart disease and other diseases of the circulatory system: Secondary | ICD-10-CM | POA: Diagnosis not present

## 2016-05-25 DIAGNOSIS — Z885 Allergy status to narcotic agent status: Secondary | ICD-10-CM | POA: Insufficient documentation

## 2016-05-25 DIAGNOSIS — Z881 Allergy status to other antibiotic agents status: Secondary | ICD-10-CM | POA: Diagnosis not present

## 2016-05-25 DIAGNOSIS — Z6830 Body mass index (BMI) 30.0-30.9, adult: Secondary | ICD-10-CM | POA: Diagnosis not present

## 2016-05-25 DIAGNOSIS — Z833 Family history of diabetes mellitus: Secondary | ICD-10-CM | POA: Insufficient documentation

## 2016-05-25 DIAGNOSIS — Z825 Family history of asthma and other chronic lower respiratory diseases: Secondary | ICD-10-CM | POA: Insufficient documentation

## 2016-05-25 DIAGNOSIS — F17210 Nicotine dependence, cigarettes, uncomplicated: Secondary | ICD-10-CM | POA: Diagnosis not present

## 2016-05-25 DIAGNOSIS — N921 Excessive and frequent menstruation with irregular cycle: Secondary | ICD-10-CM | POA: Diagnosis present

## 2016-05-25 DIAGNOSIS — Z3202 Encounter for pregnancy test, result negative: Secondary | ICD-10-CM | POA: Diagnosis not present

## 2016-05-25 LAB — URINALYSIS, ROUTINE W REFLEX MICROSCOPIC
BILIRUBIN URINE: NEGATIVE
Glucose, UA: NEGATIVE mg/dL
Hgb urine dipstick: NEGATIVE
KETONES UR: NEGATIVE mg/dL
Nitrite: NEGATIVE
PROTEIN: NEGATIVE mg/dL
SPECIFIC GRAVITY, URINE: 1.024 (ref 1.005–1.030)
pH: 5 (ref 5.0–8.0)

## 2016-05-25 LAB — RAPID URINE DRUG SCREEN, HOSP PERFORMED
AMPHETAMINES: NOT DETECTED
Barbiturates: NOT DETECTED
Benzodiazepines: NOT DETECTED
Cocaine: NOT DETECTED
OPIATES: NOT DETECTED
Tetrahydrocannabinol: NOT DETECTED

## 2016-05-25 LAB — POCT PREGNANCY, URINE: PREG TEST UR: NEGATIVE

## 2016-05-25 NOTE — MAU Note (Signed)
Patient presents with irregular periods, bled 2 times in November then again yesterday, denies pain, increased vaginal discharge without an odor.

## 2016-05-25 NOTE — MAU Provider Note (Signed)
History     CSN: [id number]  Arrival date and time: 05/25/16 1532   First Provider Initiated Contact with Patient 05/25/16 1605      Chief Complaint  Patient presents with  . Metrorrhagia   HPI   Maureen Perez is a 30 y.o. female [phone] here in MAU with irregular spotting, with concerns of pregnancy. She had bleeding two separate times last month and then spotted again yesterday. She denies concerns for STIs, denies spotting currently. Patient requesting blood pregnancy test today.   OB History    Gravida Para Term Preterm AB Living   4 2 2  0 2 2   SAB TAB Ectopic Multiple Live Births   2 0 0 0 2      Past Medical History:  Diagnosis Date  . Abnormal Pap smear    repeat WNL  . Back pain   . Cholelithiasis   . Depression    no meds since pregancny  . Family history of adverse reaction to anesthesia    " MOTHER TAKES ALONG TIME TO WAKE "  . Infection    UTI  . Injury of tendon of left rotator cuff   . Obesity   . PONV (postoperative nausea and vomiting)     Past Surgical History:  Procedure Laterality Date  . CHOLECYSTECTOMY N/A 12/05/2014   Procedure: LAPAROSCOPIC CHOLECYSTECTOMY WITH INTRAOPERATIVE CHOLANGIOGRAM;  Surgeon: Chevis PrettyPaul Toth III, MD;  Location: [facility];  Service: General;  Laterality: N/A;  . TONSILLECTOMY     age 30  . tubes in ears  age 67    Family History  Problem Relation Age of Onset  . Depression Mother   . Diabetes Mother   . Hypertension Mother   . COPD Mother   . Anesthesia problems Mother   . Depression Sister   . Diabetes Sister   . Kidney disease Sister   . Hypertension Sister   . Hypertension Father   . Anesthesia problems Father   . COPD Father     Social History  Substance Use Topics  . Smoking status: Current Every Day Smoker    Packs/day: 1.00    Years: 10.00    Types: Cigarettes  . Smokeless tobacco: Never Used  . Alcohol use No    Allergies:  Allergies  Allergen Reactions  . Cephalexin Itching and Nausea  And Vomiting  . Percocet [Oxycodone-Acetaminophen] Nausea And Vomiting    Prescriptions Prior to Admission  Medication Sig Dispense Refill Last Dose  . acetaminophen (TYLENOL) 325 MG tablet Take 650 mg by mouth every 6 (six) hours as needed for moderate pain.    Past Week at Unknown time  . HYDROcodone-acetaminophen (NORCO/VICODIN) 5-325 MG tablet Take 1 tablet by mouth every 6 (six) hours as needed for moderate pain. 10 tablet 0 Past Week at Unknown time  . naproxen (NAPROSYN) 250 MG tablet Take 1 tablet (250 mg total) by mouth 2 (two) times daily with a meal. 60 tablet 2 05/24/2016 at Unknown time  . penicillin v potassium (VEETID) 500 MG tablet Take 1 tablet (500 mg total) by mouth 2 (two) times daily. 60 tablet 0 05/24/2016 at Unknown time  . cyclobenzaprine (FLEXERIL) 10 MG tablet Take 1 tablet (10 mg total) by mouth 2 (two) times daily as needed for muscle spasms. (Patient not taking: Reported on 05/25/2016) 20 tablet 0 Not Taking at Unknown time  . ondansetron (ZOFRAN ODT) 4 MG disintegrating tablet Take 1 tablet (4 mg total) by mouth every 8 (eight) hours  as needed for nausea or vomiting. (Patient not taking: Reported on 05/25/2016) 20 tablet 0 Not Taking at Unknown time   Results for orders placed or performed during the hospital encounter of 05/25/16 (from the past 48 hour(s))  Urinalysis, Routine w reflex microscopic     Status: Abnormal   Collection Time: 05/25/16  3:45 PM  Result Value Ref Range   Color, Urine YELLOW YELLOW   APPearance CLOUDY (A) CLEAR   Specific Gravity, Urine 1.024 1.005 - 1.030   pH 5.0 5.0 - 8.0   Glucose, UA NEGATIVE NEGATIVE mg/dL   Hgb urine dipstick NEGATIVE NEGATIVE   Bilirubin Urine NEGATIVE NEGATIVE   Ketones, ur NEGATIVE NEGATIVE mg/dL   Protein, ur NEGATIVE NEGATIVE mg/dL   Nitrite NEGATIVE NEGATIVE   Leukocytes, UA LARGE (A) NEGATIVE   RBC / HPF 0-5 0 - 5 RBC/hpf   WBC, UA 6-30 0 - 5 WBC/hpf   Bacteria, UA RARE (A) NONE SEEN   Squamous  Epithelial / LPF 6-30 (A) NONE SEEN   Mucous PRESENT   Pregnancy, urine POC     Status: None   Collection Time: 05/25/16  3:50 PM  Result Value Ref Range   Preg Test, Ur NEGATIVE NEGATIVE    Comment:        THE SENSITIVITY OF THIS METHODOLOGY IS >24 mIU/mL     Review of Systems  Constitutional: Negative for chills and fever.  Gastrointestinal: Negative for abdominal pain.  Genitourinary: Negative for dysuria, flank pain, frequency, hematuria and urgency.   Physical Exam   Blood pressure 132/81, pulse 74, temperature 98.4 F (36.9 C), temperature source Oral, height 5\' 1"  (1.549 m), weight 160 lb (72.6 kg), last menstrual period 04/24/2016.  Physical Exam  Constitutional: She is oriented to person, place, and time. She appears well-developed and well-nourished. No distress.  HENT:  Head: Normocephalic.  Eyes: Pupils are equal, round, and reactive to light.  Neck: Normal range of motion.  Respiratory: Effort normal.  Musculoskeletal: Normal range of motion.  Neurological: She is alert and oriented to person, place, and time.  Skin: Skin is warm. She is not diaphoretic.  Psychiatric: Her behavior is normal.    MAU Course  Procedures  None  MDM  Urine pregnancy test negative   Assessment and Plan   A:  1. Encounter for pregnancy test with result negative   2. Irregular menstrual cycle     P:  Discharge home in stable condition Discussed the importance of PCP/ GYN If abnormal cycles continue patient encouraged to be seen in the office.  Return to MAU for emergencies only   Duane LopeJennifer I Vrishank Moster, NP 05/25/2016  7:20 PM

## 2016-05-25 NOTE — Discharge Instructions (Signed)
Metrorrhagia °Introduction °Metrorrhagia is bleeding from the uterus that is not normal. The bleeding usually happens between periods. It happens often. °Follow these instructions at home: °Pay attention to changes in your symptoms. Follow these instructions to help with your condition: °Eating and drinking °· Eat many kinds of foods. °· Eat foods that have the nutrient called iron. Some foods with iron are: °¨ Liver. °¨ Meat. °¨ Shellfish. °¨ Green leafy vegetables. °¨ Eggs. °· If you have trouble going poop (constipation): °¨ Drink plenty of water. °¨ Eat fruits and vegetables that have a lot of fiber, such as spinach, carrots, raspberries, apples, and mango. °Medicines °· Take over-the-counter and prescription medicines only as told by your doctor. °· Do not change medicines without talking with your doctor. °· Do not take aspirin or medicines that have aspirin: °¨ During the week before your period. °¨ During your period. °· Take iron pills exactly as told by your doctor. °Activity °· If you need to change your pad or tampon more than one time in 2 hours: °¨ Lie in bed with your feet raised (elevated). °¨ Put a cold pack on your lower belly (abdomen). °¨ Rest as much as possible. °· Do not try to lose weight until the bleeding has stopped and your blood iron level is okay. °Other Instructions °· For two months, write down: °¨ When your period starts. °¨ When your period ends. °¨ When you have bleeding that is not during your period. °¨ What problems you notice. °· Keep all follow-up visits as told by your doctor. This is important. °Contact a doctor if: °· You feel dizzy. °· You feel like you are going to pass out (faint). °· You feel weak. °· You feel sick to your stomach (nauseous). °· You throw up (vomit). °· You cannot eat or drink without throwing up. °· You feel dizzy while you use medicine. °· You have watery poop (diarrhea) while you use medicine. °· You want to change the birth control pills or hormones  that you take. °· You want to stop taking birth control pills or hormones. °Get help right away if: °· You have a fever. °· You have chills. °· You need to change your pad or tampon more than one time in an hour. °· You have more bleeding from your vagina than before. °· You have clumps of blood coming from your vagina. °· You have pain in your belly. °· You pass out. °· You have a rash. °This information is not intended to replace advice given to you by your health care provider. Make sure you discuss any questions you have with your health care provider. °Document Released: 08/26/2011 Document Revised: 11/09/2015 Document Reviewed: 08/29/2014 °© 2017 Elsevier ° °

## 2016-06-03 ENCOUNTER — Ambulatory Visit (INDEPENDENT_AMBULATORY_CARE_PROVIDER_SITE_OTHER): Payer: BLUE CROSS/BLUE SHIELD | Admitting: Internal Medicine

## 2016-06-03 ENCOUNTER — Other Ambulatory Visit (HOSPITAL_COMMUNITY)
Admission: RE | Admit: 2016-06-03 | Discharge: 2016-06-03 | Disposition: A | Discharge status: Home / Self Care | Payer: BLUE CROSS/BLUE SHIELD | Source: Ambulatory Visit | Attending: Family Medicine | Admitting: Family Medicine

## 2016-06-03 ENCOUNTER — Encounter: Payer: Self-pay | Admitting: Internal Medicine

## 2016-06-03 VITALS — BP 110/70 | HR 86 | Temp 98.7°F | Wt 160.0 lb

## 2016-06-03 DIAGNOSIS — N939 Abnormal uterine and vaginal bleeding, unspecified: Secondary | ICD-10-CM

## 2016-06-03 DIAGNOSIS — N926 Irregular menstruation, unspecified: Secondary | ICD-10-CM

## 2016-06-03 DIAGNOSIS — Z113 Encounter for screening for infections with a predominantly sexual mode of transmission: Secondary | ICD-10-CM | POA: Insufficient documentation

## 2016-06-03 DIAGNOSIS — N920 Excessive and frequent menstruation with regular cycle: Secondary | ICD-10-CM | POA: Diagnosis not present

## 2016-06-03 LAB — POCT WET PREP (WET MOUNT)
Clue Cells Wet Prep Whiff POC: POSITIVE
Trichomonas Wet Prep HPF POC: ABSENT

## 2016-06-03 LAB — POCT URINE PREGNANCY: PREG TEST UR: NEGATIVE

## 2016-06-03 NOTE — Progress Notes (Signed)
   [facility] Phone: [phone]   Date of Visit: 06/03/2016   HPI: Maureen Perez is a 30 y.o. female presenting to clinic today for same day appointment. PCP: Levert FeinsteinBrittany McIntyre, MD Concerns today include:  - reports of having abnormal periods. Reports she has been spotting Nov 22nd for 3 days and Dec 2nd for 2 hours. This did not occur after intercourse.   - usually has regular periods: every month and lasts 5 days  - denies abdominal/pelivc pain - is sexualy active; one partner- boyfriend for 1 year. Denies concern for STI  - no vaginal discharge and no vagnial itching  - no history of STI - is trying to get pregnant  - [facility]: negative urine pregnancy test - reports that she has had negative urine pregnancy but positive serum pregnancy test in the past; she was [redacted] weeks pregnant at that time  - reports mother has cervical cancer  - per chart review, normal pap 12/2015  - currently on PCN for pulpitis  - currently smoking   ROS: See HPI.  PMFSH:  Tobacco Use   PHYSICAL EXAM: BP 110/70   Pulse 86   Temp 98.7 F (37.1 C) (Oral)   Wt 160 lb (72.6 kg)   LMP 04/24/2016   SpO2 99%   BMI 30.23 kg/m  GEN: NAD PULM: normal effort  ABD: Soft, nontender, nondistended, NABS, no organomegaly GU: Female genitalia: normal external genitalia, vulva, vagina. Cervix with two nabothian gland cysts in the 1 o'clock position. Normal uterus and adnexa. No significant discharge noted.  SKIN: No rash or cyanosis; warm and well-perfused EXTR: No lower extremity edema or calf tenderness PSYCH: Mood and affect euthymic, normal rate and volume of speech NEURO: Awake, alert, no focal deficits grossly, normal speech  ASSESSMENT/PLAN: 1.Vaginal spotting: Patient who has not had a regular period the past two months. Is trying to become pregnant. Urine pregnancy is negative. Will obtain serum hcg with her history of negative  urine test but positive serum test (ordered as future test as lab is closed currently). Patient reports no concern for STI but will still screen due to symptoms with Gc/Chlamydia. Wet prep with clue cells; will not treat currently as patient is asymptomatic.   - POCT urine pregnancy - POCT Wet Prep [facility]([place]) - Cervicovaginal ancillary only - hCG, serum, qualitative; Future  - recommended starting prenatal vitamins and quitting smoking if trying to become pregnant.   Palma HolterKanishka G Gailya Tauer, MD PGY 2 [facility]

## 2016-06-03 NOTE — Patient Instructions (Addendum)
I ordered blood test to see if you are pregnant. Please make a lab visit as soon as possible to get this done. I will get in touch with you regarding your other results. Please start taking Multivitamin daily if you are trying to get pregnant, and I recommend that you quit smoking as well.

## 2016-06-04 ENCOUNTER — Other Ambulatory Visit: Payer: BLUE CROSS/BLUE SHIELD

## 2016-06-04 DIAGNOSIS — N926 Irregular menstruation, unspecified: Secondary | ICD-10-CM

## 2016-06-05 ENCOUNTER — Telehealth: Payer: Self-pay | Admitting: Internal Medicine

## 2016-06-05 ENCOUNTER — Encounter: Payer: Self-pay | Admitting: Internal Medicine

## 2016-06-05 ENCOUNTER — Telehealth: Payer: Self-pay | Admitting: *Deleted

## 2016-06-05 DIAGNOSIS — N926 Irregular menstruation, unspecified: Secondary | ICD-10-CM

## 2016-06-05 LAB — HCG, SERUM, QUALITATIVE: Preg, Serum: NEGATIVE

## 2016-06-05 LAB — CERVICOVAGINAL ANCILLARY ONLY
CHLAMYDIA, DNA PROBE: NEGATIVE
Neisseria Gonorrhea: NEGATIVE

## 2016-06-05 NOTE — Telephone Encounter (Signed)
Called patient to report that her serum hcg was negative. Upon further questioning, she reports that she has been under significant stress over the past 4 months with her father having a stroke, mother being admitted to the hospital, and having her brother drinking. No significant weight changes per chart review. She has not been exercising excessively. Denies galactorrhea. Denies history of uterine procedures such as D&C. Denies having hot flashes/vaginal dryness. She has had 2 vaginal deliveries with the most recent in 2015 and 2 SAB.  We discussed that this may be due to the stress in her life. We discussed watchful waiting for about 2 months vs obtaining blood work now to further evaluate with FSH, Estradiol, Serum Prolactin, and TSH. Patient opted to do blood work now. Will order as future order. Patient to make lab visit.

## 2016-06-05 NOTE — Progress Notes (Signed)
Letter sent with negative STI testing.

## 2016-06-05 NOTE — Telephone Encounter (Signed)
Patient returning MD call regarding labs.

## 2016-06-05 NOTE — Telephone Encounter (Signed)
Called patient to discuss lab results. Went to voicemail and left message to cal back clinic.

## 2016-06-26 ENCOUNTER — Ambulatory Visit (INDEPENDENT_AMBULATORY_CARE_PROVIDER_SITE_OTHER): Payer: BLUE CROSS/BLUE SHIELD | Admitting: Family Medicine

## 2016-06-26 ENCOUNTER — Encounter: Payer: Self-pay | Admitting: Family Medicine

## 2016-06-26 VITALS — BP 118/74 | HR 83 | Temp 97.7°F | Ht 61.0 in | Wt 159.0 lb

## 2016-06-26 DIAGNOSIS — A084 Viral intestinal infection, unspecified: Secondary | ICD-10-CM | POA: Diagnosis not present

## 2016-06-26 MED ORDER — LOPERAMIDE HCL 2 MG PO TABS: 2.0000 mg | ORAL_TABLET | Freq: Four times a day (QID) | ORAL | 0 refills | Status: DC | PRN

## 2016-06-26 MED ORDER — ONDANSETRON 4 MG PO TBDP: 4.0000 mg | ORAL_TABLET | Freq: Three times a day (TID) | ORAL | 0 refills | Status: DC | PRN

## 2016-06-26 NOTE — Patient Instructions (Signed)
Viral Gastroenteritis, Adult Introduction Viral gastroenteritis is also known as the stomach flu. This condition is caused by certain germs (viruses). These germs can be passed from person to person very easily (are very contagious). This condition can cause sudden watery poop (diarrhea), fever, and throwing up (vomiting). Having watery poop and throwing up can make you feel weak and cause you to get dehydrated. Dehydration can make you tired and thirsty, make you have a dry mouth, and make it so you pee (urinate) less often. Older adults and people with other diseases or a weak defense system (immune system) are at higher risk for dehydration. It is important to replace the fluids that you lose from having watery poop and throwing up. Follow these instructions at home: Follow instructions from your doctor about how to care for yourself at home. Eating and drinking Follow these instructions as told by your doctor:  Take an oral rehydration solution (ORS). This is a drink that is sold at pharmacies and stores.  Drink clear fluids in small amounts as you are able, such as:  Water.  Ice chips.  Diluted fruit juice.  Low-calorie sports drinks.  Eat bland, easy-to-digest foods in small amounts as you are able, such as:  Bananas.  Applesauce.  Rice.  Low-fat (lean) meats.  Toast.  Crackers.  Avoid fluids that have a lot of sugar or caffeine in them.  Avoid alcohol.  Avoid spicy or fatty foods. General instructions  Drink enough fluid to keep your pee (urine) clear or pale yellow.  Wash your hands often. If you cannot use soap and water, use hand sanitizer.  Make sure that all people in your home wash their hands well and often.  Rest at home while you get better.  Take over-the-counter and prescription medicines only as told by your doctor.  Watch your condition for any changes.  Take a warm bath to help with any burning or pain from having watery poop.  Keep all  follow-up visits as told by your doctor. This is important. Contact a doctor if:  You cannot keep fluids down.  Your symptoms get worse.  You have new symptoms.  You feel light-headed or dizzy.  You have muscle cramps. Get help right away if:  You have chest pain.  You feel very weak or you pass out (faint).  You see blood in your throw-up.  Your throw-up looks like coffee grounds.  You have bloody or black poop (stools) or poop that look like tar.  You have a very bad headache, a stiff neck, or both.  You have a rash.  You have very bad pain, cramping, or bloating in your belly (abdomen).  You have trouble breathing.  You are breathing very quickly.  Your heart is beating very quickly.  Your skin feels cold and clammy.  You feel confused.  You have pain when you pee.  You have signs of dehydration, such as:  Dark pee, hardly any pee, or no pee.  Cracked lips.  Dry mouth.  Sunken eyes.  Sleepiness.  Weakness. This information is not intended to replace advice given to you by your health care provider. Make sure you discuss any questions you have with your health care provider. Document Released: 11/20/2007 Document Revised: 12/22/2015 Document Reviewed: 02/07/2015  2017 Elsevier  

## 2016-06-26 NOTE — Progress Notes (Signed)
    Subjective:  Maureen Perez is a 31 y.o. female who [facility] today with a chief complaint of diarrhea and vomiting.   HPI:  Diarrhea and vomiting Symptoms started this morning around 430 am. Woke up with severe nausea. Had one episode of nonbloody nonbilious emesis. Has had persistent diarrhea since then. Diarrhea is nonbloody. Has mild abdominal pain in the mid section of her abdomen. No fevers. She has not tried any medications. Several other family members have similar symptoms. Patient has not tried to eat anything since this morning.   ROS: Per HPI  PMH: Smoking history reviewed.    Objective:  Physical Exam: BP 118/74 (BP Location: Left Arm, Patient Position: Sitting, Cuff Size: Normal)   Pulse 83   Temp 97.7 F (36.5 C) (Oral)   Ht 5\' 1"  (1.549 m)   Wt 159 lb (72.1 kg)   LMP 06/07/2016 (Approximate)   SpO2 98%   BMI 30.04 kg/m   Gen: 30yo female in NAD, resting comfortably HEENT: MMM CV: RRR with no murmurs appreciated Pulm: NWOB, CTAB with no crackles, wheezes, or rhonchi GI: +BS, S, NT, ND. No rebound or guarding.  MSK: no edema, cyanosis, or clubbing noted Skin: warm, dry Neuro: grossly normal, moves all extremities Psych: Normal affect and thought content  Assessment/Plan:  Viral Gastroenteritis No red flag signs or symptoms. No signs of dehydration. Likely viral given that her entire household has the same symptoms. Will treat symptomatically with zofran and imodium. Strict return precautions reviewed. Follow up as needed.   Katina Degreealeb M. Jimmey RalphParker, MD [facility] Resident PGY-3 06/26/2016 2:53 PM

## 2016-08-23 ENCOUNTER — Encounter (HOSPITAL_COMMUNITY): Payer: Self-pay

## 2016-08-23 ENCOUNTER — Emergency Department (HOSPITAL_COMMUNITY)
Admission: EM | Admit: 2016-08-23 | Discharge: 2016-08-23 | Disposition: A | Discharge status: Home / Self Care | Payer: BLUE CROSS/BLUE SHIELD | Attending: Emergency Medicine | Admitting: Emergency Medicine

## 2016-08-23 DIAGNOSIS — L235 Allergic contact dermatitis due to other chemical products: Secondary | ICD-10-CM | POA: Insufficient documentation

## 2016-08-23 DIAGNOSIS — F17210 Nicotine dependence, cigarettes, uncomplicated: Secondary | ICD-10-CM | POA: Insufficient documentation

## 2016-08-23 MED ORDER — TRIAMCINOLONE ACETONIDE 0.1 % EX CREA: 1.0000 "application " | TOPICAL_CREAM | Freq: Two times a day (BID) | CUTANEOUS | 0 refills | Status: DC

## 2016-08-23 MED ORDER — DIPHENHYDRAMINE HCL 25 MG PO TABS: 25.0000 mg | ORAL_TABLET | Freq: Three times a day (TID) | ORAL | 0 refills | Status: DC | PRN

## 2016-08-23 NOTE — ED Triage Notes (Signed)
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

## 2016-08-23 NOTE — ED Provider Notes (Signed)
[facility] Provider Note   CSN: [id number] Arrival date & time: 08/23/16  1615  By signing my name below, I, Doreatha Martin, attest that this documentation has been prepared under the direction and in the presence of  [facility] M. Damian Leavell, NP. Electronically Signed: Doreatha Martin, ED Scribe. 08/23/16. 5:23 PM.    History   Chief Complaint Chief Complaint  Patient presents with  . Rash    HPI Maureen Perez is a 31 y.o. female who presents to the Emergency Department complaining of gradually worsening rash to bilateral hands and forearms that began 2 days ago. 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 She describes her rash as itchy and burning. She states she has tried applying lotion and anti-itch spray with no relief. She denies wheezing, SOB.    The history is provided by the patient. No language interpreter was used.  Rash   This is a new problem. The current episode started 2 days ago. The problem has been gradually worsening. Associated with: contact with cleaning supplies. There has been no fever. The rash is present on the left arm, right arm, right hand and left hand. The pain is moderate. The pain has been constant since onset. Associated symptoms include itching and pain. She has tried anti-itch cream and cold cream for the symptoms. The treatment provided no relief.    Past Medical History:  Diagnosis Date  . Abnormal Pap smear    repeat WNL  . Back pain   . Cholelithiasis   . Depression    no meds since pregancny  . Family history of adverse reaction to anesthesia    " MOTHER TAKES ALONG TIME TO WAKE "  . Infection    UTI  . Injury of tendon of left rotator cuff   . Obesity   . PONV (postoperative nausea and vomiting)     Patient Active Problem List   Diagnosis Date Noted  . 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 Viral URI with cough 05/07/2016  .  Acute cholecystitis 12/05/2014  . GERD (gastroesophageal reflux disease) 06/15/2013  . Smoking complicating pregnancy, antepartum 01/12/2013  . Lymphadenitis 09/11/2010  . DEPRESSION, MODERATE, RECURRENT 03/20/2010  . 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 INSOMNIA 01/06/2009  . TOBACCO ABUSE 11/22/2008  . ALLERGIC RHINITIS DUE TO ANIMAL HAIR AND DANDER 11/22/2008  . OBESITY 10/20/2008    Past Surgical History:  Procedure Laterality Date  . CHOLECYSTECTOMY N/A 12/05/2014   Procedure: LAPAROSCOPIC CHOLECYSTECTOMY WITH INTRAOPERATIVE CHOLANGIOGRAM;  Surgeon: Chevis Pretty III, MD;  Location: [facility] OR;  Service: General;  Laterality: N/A;  . TONSILLECTOMY     age 40  . tubes in ears  age 53    OB History    Gravida Para Term Preterm AB Living   4 2 2  0 2 2   SAB TAB Ectopic Multiple Live Births   2 0 0 0 2       Home Medications    Prior to Admission medications   Medication Sig Start Date End Date Taking? Authorizing Provider  acetaminophen (TYLENOL) 325 MG tablet Take 650 mg by mouth every 6 (six) hours as needed for moderate pain.     Historical Provider, MD  diphenhydrAMINE (BENADRYL) 25 MG tablet Take 1 tablet (25 mg total) by mouth every 8 (eight) hours as needed. 08/23/16   Hope Orlene Och, NP  HYDROcodone-acetaminophen (NORCO/VICODIN) [phone]  MG tablet Take 1 tablet by mouth every 6 (six) hours as needed for moderate pain. 05/17/16   Beaulah Dinninghristina M Gambino, MD  loperamide (IMODIUM A-D) 2 MG tablet Take 1 tablet (2 mg total) by mouth 4 (four) times daily as needed for diarrhea or loose stools. 06/26/16   Ardith Darkaleb M Parker, MD  naproxen (NAPROSYN) 250 MG tablet Take 1 tablet (250 mg total) by mouth 2 (two) times daily with a meal. 05/17/16   Beaulah Dinninghristina M Gambino, MD  ondansetron (ZOFRAN ODT) 4 MG disintegrating tablet Take 1 tablet (4 mg total) by mouth every 8 (eight) hours as needed for nausea or vomiting. 06/26/16   Ardith Darkaleb M Parker, MD  triamcinolone cream (KENALOG) 0.1 % Apply 1 application  topically 2 (two) times daily. 08/23/16   Hope Orlene OchM Neese, NP    Family History Family History  Problem Relation Age of Onset  . Depression Mother   . Diabetes Mother   . Hypertension Mother   . COPD Mother   . Anesthesia problems Mother   . Depression Sister   . Diabetes Sister   . Kidney disease Sister   . Hypertension Sister   . Hypertension Father   . Anesthesia problems Father   . COPD Father     Social History Social History  Substance Use Topics  . Smoking status: Current Every Day Smoker    Packs/day: 1.00    Years: 10.00    Types: Cigarettes  . Smokeless tobacco: Never Used  . Alcohol use No     Allergies   Cephalexin and Percocet [oxycodone-acetaminophen]   Review of Systems Review of Systems  HENT: Negative for sore throat and trouble swallowing.   Respiratory: Negative for cough and wheezing.   Gastrointestinal: Negative for nausea and vomiting.  Skin: Positive for itching and rash.     Physical Exam Updated Vital Signs BP 127/73 (BP Location: Left Arm)   Pulse 76   Temp 98.3 F (36.8 C) (Oral)   Resp 16   LMP 07/31/2016   SpO2 99%   Physical Exam  Constitutional: She appears well-developed and well-nourished.  HENT:  Head: Normocephalic and atraumatic.  Right Ear: Tympanic membrane normal.  Left Ear: Tympanic membrane normal.  Mouth/Throat: Uvula is midline, oropharynx is clear and moist and mucous membranes are normal. No posterior oropharyngeal edema or posterior oropharyngeal erythema.  Nasal mucosa normal. Uvula midline. No edema or erythema.   Eyes: Pupils are equal, round, and reactive to light.  Neck: Normal range of motion.  Cardiovascular: Normal rate and regular rhythm.   Pulmonary/Chest: Effort normal and breath sounds normal. No respiratory distress. She has no wheezes. She has no rales.  Musculoskeletal: Normal range of motion.  Neurological: She is alert.  Skin: Skin is warm and dry. There is erythema.  Erythema to the ulnar  aspect of the right forearm. Dryness and mild erythema to the left hand on the dorsum and to the left forearm on the palmar aspect.   Psychiatric: She has a normal mood and affect. Her behavior is normal.  Nursing note and vitals reviewed.    ED Treatments / Results   DIAGNOSTIC STUDIES: Oxygen Saturation is 99% on RA, normal by my interpretation.    COORDINATION OF CARE: 5:16 PM Discussed treatment plan with pt at bedside which includes steroid cream and pt agreed to plan.    Radiology No results found.  Procedures Procedures (including critical care time)  Medications Ordered in ED Medications - No data to display  Initial Impression / Assessment and Plan / ED Course  I have reviewed the triage vital signs and the nursing notes.  ESTHELA BRANDNER presents to the ED for evaluation of rash after using cleaning products. Consistent with contact dermatitis. Patient will be sent home with triamcinolone cream and Benadryl. Conservative therapies discussed and recommended. Patient advised to follow up with Dermatology. Patient appears stable for discharge at this time. Return precautions discussed and outlined in discharge paperwork. Patient is agreeable to plan.     Final Clinical Impressions(s) / ED Diagnoses   Final diagnoses:  Allergic dermatitis due to other chemical product    New Prescriptions Discharge Medication List as of 08/23/2016  5:27 PM    START taking these medications   Details  diphenhydrAMINE (BENADRYL) 25 MG tablet Take 1 tablet (25 mg total) by mouth every 8 (eight) hours as needed., Starting Fri 08/23/2016, Print    triamcinolone cream (KENALOG) 0.1 % Apply 1 application topically 2 (two) times daily., Starting Fri 08/23/2016, Print        I personally performed the services described in this documentation, which was scribed in my presence. The recorded information has been reviewed and is accurate.    [address] [place], [place] 08/24/16 1610    Gwyneth Sprout, MD 08/26/16 2053

## 2016-09-17 ENCOUNTER — Emergency Department (HOSPITAL_COMMUNITY)
Admission: EM | Admit: 2016-09-17 | Discharge: 2016-09-17 | Disposition: A | Discharge status: Home / Self Care | Payer: BLUE CROSS/BLUE SHIELD | Attending: Emergency Medicine | Admitting: Emergency Medicine

## 2016-09-17 ENCOUNTER — Encounter (HOSPITAL_COMMUNITY): Payer: Self-pay | Admitting: Emergency Medicine

## 2016-09-17 DIAGNOSIS — F17210 Nicotine dependence, cigarettes, uncomplicated: Secondary | ICD-10-CM | POA: Insufficient documentation

## 2016-09-17 DIAGNOSIS — Z79899 Other long term (current) drug therapy: Secondary | ICD-10-CM | POA: Insufficient documentation

## 2016-09-17 DIAGNOSIS — H1031 Unspecified acute conjunctivitis, right eye: Secondary | ICD-10-CM | POA: Insufficient documentation

## 2016-09-17 MED ORDER — FLUORESCEIN SODIUM 0.6 MG OP STRP
1.0000 | ORAL_STRIP | Freq: Once | OPHTHALMIC | Status: AC
  Administered 2016-09-17: 1 via OPHTHALMIC
  Filled 2016-09-17: qty 1

## 2016-09-17 MED ORDER — CIPROFLOXACIN HCL 0.3 % OP OINT: TOPICAL_OINTMENT | OPHTHALMIC | 0 refills | Status: DC

## 2016-09-17 MED ORDER — TETRACAINE HCL 0.5 % OP SOLN
1.0000 [drp] | Freq: Once | OPHTHALMIC | Status: AC
  Administered 2016-09-17: 1 [drp] via OPHTHALMIC
  Filled 2016-09-17: qty 2

## 2016-09-17 NOTE — ED Provider Notes (Signed)
[facility] Provider Note   CSN: [id number] Arrival date & time: 09/17/16  1008   By signing my name below, I, Teofilo Pod, attest that this documentation has been prepared under the direction and in the presence of Graciella Freer, [place]. Electronically Signed: Teofilo Pod, ED Scribe. 09/17/2016. 11:13 AM.   History   Chief Complaint Chief Complaint  Patient presents with  . Eye Pain    The history is provided by the patient. No language interpreter was used.  HPI Comments:  Maureen Perez is a 31 y.o. female who presents to the Emergency Department complaining of constant right eye pain and redness since x 2 days. She describes the pain as a "burning, irritating" sensation. Patient notes that prior to onset of pain, she had put in her contacts. She also reports some intermittent blurry vision. She has still continued to wear her contacts since symptoms started. No alleviating factors noted. She denies any trauma to the eye.  Denies photophobia, eye discharge, fever, chills, abdominal pain, vomiting.     Past Medical History:  Diagnosis Date  . Abnormal Pap smear    repeat WNL  . Back pain   . Cholelithiasis   . Depression    no meds since pregancny  . Family history of adverse reaction to anesthesia    " MOTHER TAKES ALONG TIME TO WAKE "  . Infection    UTI  . Injury of tendon of left rotator cuff   . Obesity   . PONV (postoperative nausea and vomiting)     Patient Active Problem List   Diagnosis Date Noted  . 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 Viral URI with cough 05/07/2016  . Acute cholecystitis 12/05/2014  . GERD (gastroesophageal reflux disease) 06/15/2013  . Smoking complicating pregnancy, antepartum 01/12/2013  . Lymphadenitis 09/11/2010  . DEPRESSION, MODERATE, RECURRENT 03/20/2010  . 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 INSOMNIA 01/06/2009  . TOBACCO ABUSE 11/22/2008  . ALLERGIC RHINITIS DUE TO ANIMAL HAIR AND DANDER 11/22/2008  .  OBESITY 10/20/2008    Past Surgical History:  Procedure Laterality Date  . CHOLECYSTECTOMY N/A 12/05/2014   Procedure: LAPAROSCOPIC CHOLECYSTECTOMY WITH INTRAOPERATIVE CHOLANGIOGRAM;  Surgeon: Chevis Pretty III, MD;  Location: [facility] OR;  Service: General;  Laterality: N/A;  . TONSILLECTOMY     age 80  . tubes in ears  age 31    OB History    Gravida Para Term Preterm AB Living   0 2 2   SAB TAB Ectopic Multiple Live Births   2 0 0 0 2       Home Medications    Prior to Admission medications   Medication Sig Start Date End Date Taking? Authorizing Provider  acetaminophen (TYLENOL) 325 MG tablet Take 650 mg by mouth every 6 (six) hours as needed for moderate pain.     Historical Provider, MD  ciprofloxacin (CILOXAN) 0.3 % ophthalmic ointment 1 to 2 drops into eyes  every 2 hours while awake for 2 days, then 1 to 2 drops every 4 hours while awake for next 5 days 09/17/16   Maxwell Caul, PA-C  diphenhydrAMINE (BENADRYL) 25 MG tablet Take 1 tablet (25 mg total) by mouth every 8 (eight) hours as needed. 08/23/16   Hope Orlene Och, NP  HYDROcodone-acetaminophen (NORCO/VICODIN) 5-325 MG tablet Take 1 tablet by mouth every 6 (six) hours as needed for moderate pain. 05/17/16   Beaulah Dinning, MD  loperamide (IMODIUM A-D) 2 MG tablet  Take 1 tablet (2 mg total) by mouth 4 (four) times daily as needed for diarrhea or loose stools. 06/26/16   Ardith Dark, MD  naproxen (NAPROSYN) 250 MG tablet Take 1 tablet (250 mg total) by mouth 2 (two) times daily with a meal. 05/17/16   Beaulah Dinning, MD  ondansetron (ZOFRAN ODT) 4 MG disintegrating tablet Take 1 tablet (4 mg total) by mouth every 8 (eight) hours as needed for nausea or vomiting. 06/26/16   Ardith Dark, MD  triamcinolone cream (KENALOG) 0.1 % Apply 1 application topically 2 (two) times daily. 08/23/16   Hope Orlene Och, NP    Family History Family History  Problem Relation Age of Onset  . Depression Mother   . Diabetes Mother   .  Hypertension Mother   . COPD Mother   . Anesthesia problems Mother   . Depression Sister   . Diabetes Sister   . Kidney disease Sister   . Hypertension Sister   . Hypertension Father   . Anesthesia problems Father   . COPD Father     Social History Social History  Substance Use Topics  . Smoking status: Current Every Day Smoker    Packs/day: 1.00    Years: 10.00    Types: Cigarettes  . Smokeless tobacco: Never Used  . Alcohol use No     Allergies   Cephalexin and Percocet [oxycodone-acetaminophen]   Review of Systems Review of Systems  Constitutional: Negative for fever.  Eyes: Positive for pain, redness and visual disturbance. Negative for photophobia and discharge.  Respiratory: Negative for cough and shortness of breath.   Cardiovascular: Negative for chest pain.  Gastrointestinal: Negative for abdominal pain, diarrhea, nausea and vomiting.  Genitourinary: Negative for dysuria and hematuria.  Neurological: Negative for headaches.  All other systems reviewed and are negative.    Physical Exam Updated Vital Signs BP 113/73   Pulse 60   Temp 98.7 F (37.1 C) (Oral)   Resp 16   SpO2 100%   Physical Exam  Constitutional: She appears well-developed and well-nourished.  HENT:  Head: Normocephalic and atraumatic.  Nose: Right sinus exhibits no maxillary sinus tenderness and no frontal sinus tenderness. Left sinus exhibits no maxillary sinus tenderness and no frontal sinus tenderness.  Eyes: EOM and lids are normal. Pupils are equal, round, and reactive to light. Right eye exhibits no discharge. Left eye exhibits no discharge. Right conjunctiva is injected (mild). No scleral icterus. Right eye exhibits no nystagmus. Left eye exhibits no nystagmus.  No periorbital edema.  Woods Lamp exam: No fluorescein uptake, no evidence of corneal abrasion.  Slit lamp exam: No cells or flares seen  Pulmonary/Chest: Effort normal.  Musculoskeletal: She exhibits no deformity.    Neurological: She is alert.  Skin: Skin is warm and dry.  Psychiatric: She has a normal mood and affect. Her speech is normal and behavior is normal.     ED Treatments / Results  DIAGNOSTIC STUDIES:  Oxygen Saturation is 100% on RA, normal by my interpretation.    COORDINATION OF CARE:   Labs (all labs ordered are listed, but only abnormal results are displayed) Labs Reviewed - No data to display  EKG  EKG Interpretation None       Radiology No results found.  Procedures Procedures (including critical care time)  Medications Ordered in ED Medications  tetracaine (PONTOCAINE) 0.5 % ophthalmic solution 1 drop (1 drop Right Eye Given 09/17/16 1126)  fluorescein ophthalmic strip 1 strip (1 strip Left  Eye Given 09/17/16 1127)     Initial Impression / Assessment and Plan / ED Course  I have reviewed the triage vital signs and the nursing notes.  Pertinent labs & imaging results that were available during my care of the patient were reviewed by me and considered in my medical decision making (see chart for details).      32 yo female presents 2 days of eye pain and redness. She does wear contacts. No trauma to the eye. Consider corneal abrasion vs conjunctivitis given history and physical. Low suspicion for preseptal cellulitis as patient is not exhibiting any periorbital edema. Plan to examine eye with Woods lamp to evaluate for corneal abrasion.  Woods lamp exam does not show any fluorescein uptake or signs of corneal abrasion. Slit lamp examine also performed with no evidence of cells or flares.   Will plan to treat for conjunctivitis and will cover for pseudomonas given history of contacts. Instructed patient not to wear her contacts for a week while this is healing. Instructed patient to use eye drops as prescribed. Patient follows with an eye doctor and states she would want to see him. Instructed patient to call her eye doctor to arrange for an appointment either today  or tomorrow. Return precautions discussed. Patient expresses understanding and agreement to plan.    Final Clinical Impressions(s) / ED Diagnoses   Final diagnoses:  Acute conjunctivitis of right eye, unspecified acute conjunctivitis type    New Prescriptions Discharge Medication List as of 09/17/2016 12:16 PM    START taking these medications   Details  ciprofloxacin (CILOXAN) 0.3 % ophthalmic ointment 1 to 2 drops into eyes  every 2 hours while awake for 2 days, then 1 to 2 drops every 4 hours while awake for next 5 days, Print      I personally performed the services described in this documentation, which was scribed in my presence. The recorded information has been reviewed and is accurate.      Maxwell Caul, PA-C 09/17/16 1657    Pricilla Loveless, MD 09/19/16 [phone]

## 2016-09-17 NOTE — Discharge Instructions (Signed)
Use eye drops as directed.   Follow-up with your eye doctor. Contact him today and see if you can see him tomorrow or this week.  Do not wear contacts for a week.   Return to the Emergency Dept for any worsening eye pain, vision changes, drainage, or any other concerns.

## 2016-09-17 NOTE — ED Triage Notes (Signed)
Pt sts burning when put contact lens in yesterday and now having some blurry vision

## 2016-12-17 ENCOUNTER — Emergency Department (HOSPITAL_COMMUNITY)
Admission: EM | Admit: 2016-12-17 | Discharge: 2016-12-17 | Discharge status: Against Medical Advice | Payer: BLUE CROSS/BLUE SHIELD

## 2016-12-17 NOTE — ED Notes (Signed)
Name called no answer 

## 2016-12-24 ENCOUNTER — Encounter (HOSPITAL_COMMUNITY): Payer: Self-pay | Admitting: Vascular Surgery

## 2016-12-24 ENCOUNTER — Emergency Department (HOSPITAL_COMMUNITY)
Admission: EM | Admit: 2016-12-24 | Discharge: 2016-12-24 | Disposition: A | Discharge status: Home / Self Care | Payer: BLUE CROSS/BLUE SHIELD | Attending: Emergency Medicine | Admitting: Emergency Medicine

## 2016-12-24 DIAGNOSIS — R232 Flushing: Secondary | ICD-10-CM

## 2016-12-24 DIAGNOSIS — N951 Menopausal and female climacteric states: Secondary | ICD-10-CM | POA: Insufficient documentation

## 2016-12-24 DIAGNOSIS — R110 Nausea: Secondary | ICD-10-CM

## 2016-12-24 DIAGNOSIS — F329 Major depressive disorder, single episode, unspecified: Secondary | ICD-10-CM | POA: Insufficient documentation

## 2016-12-24 DIAGNOSIS — F17210 Nicotine dependence, cigarettes, uncomplicated: Secondary | ICD-10-CM | POA: Insufficient documentation

## 2016-12-24 DIAGNOSIS — Z9049 Acquired absence of other specified parts of digestive tract: Secondary | ICD-10-CM | POA: Insufficient documentation

## 2016-12-24 LAB — I-STAT BETA HCG BLOOD, ED (MC, WL, AP ONLY): I-stat hCG, quantitative: <5 m[IU]/mL (ref <5)

## 2016-12-24 LAB — COMPREHENSIVE METABOLIC PANEL
ALT: 12 U/L — ABNORMAL LOW (ref 14–54)
AST: 15 U/L (ref 15–41)
Albumin: 4.3 g/dL (ref 3.5–5.0)
Alkaline Phosphatase: 59 U/L (ref 38–126)
Anion gap: 6 (ref 5–15)
BUN: 9 mg/dL (ref 6–20)
CO2: 25 mmol/L (ref 22–32)
Calcium: 9.2 mg/dL (ref 8.9–10.3)
Chloride: 107 mmol/L (ref 101–111)
Creatinine, Ser: 0.85 mg/dL (ref 0.44–1.00)
GFR calc Af Amer: >60 mL/min (ref >60)
GFR calc non Af Amer: >60 mL/min (ref >60)
Glucose, Bld: 104 mg/dL — ABNORMAL HIGH (ref 65–99)
Potassium: 3.6 mmol/L (ref 3.5–5.1)
Sodium: 138 mmol/L (ref 135–145)
Total Bilirubin: 0.6 mg/dL (ref 0.3–1.2)
Total Protein: 6.6 g/dL (ref 6.5–8.1)

## 2016-12-24 LAB — CBC
HCT: 43.3 % (ref 36.0–46.0)
Hemoglobin: 14.2 g/dL (ref 12.0–15.0)
MCH: 29.6 pg (ref 26.0–34.0)
MCHC: 32.8 g/dL (ref 30.0–36.0)
MCV: 90.4 fL (ref 78.0–100.0)
Platelets: 225 10*3/uL (ref 150–400)
RBC: 4.79 MIL/uL (ref 3.87–5.11)
RDW: 13.4 % (ref 11.5–15.5)
WBC: 10.4 10*3/uL (ref 4.0–10.5)

## 2016-12-24 LAB — LIPASE, BLOOD: Lipase: 33 U/L (ref 11–51)

## 2016-12-24 NOTE — ED Notes (Signed)
Got patient into a gown on the monitor family is a bedside

## 2016-12-24 NOTE — ED Provider Notes (Signed)
[facility]-EMER[facility]ENCY DEPT Provider Note   CSN: [id number] Arrival date & time: 12/24/16  1249   By signing my name below, I, Freida Busman, attest that this documentation has been prepared under the direction and in the presence of Raeford Razor, MD . Electronically Signed: Freida Busman, Scribe. 12/24/2016. 3:49 PM.    History   Chief Complaint Chief Complaint  Patient presents with  . Nausea    The history is provided by the patient. No language interpreter was used.     HPI Comments:  Maureen Perez is a 31 y.o. female who presents to the Emergency Department complaining of amenorrhea. She states her LNMP was 11/08/2016. She reports associated nausea and "hot flashes". She describes sudden warm sensation in her face. She denies abdominal pain and vaginal discharge. She notes h/o similar cycle ~6 months ago and was advised it was due to stress. Pt has no other acute complaints or associated symptoms at this time. No modifying factors. Pt has no other acute complaints or associated symptoms at this time.   Past Medical History:  Diagnosis Date  . Abnormal Pap smear    repeat WNL  . Back pain   . Cholelithiasis   . Depression    no meds since pregancny  . Family history of adverse reaction to anesthesia    " MOTHER TAKES ALONG TIME TO WAKE "  . Infection    UTI  . Injury of tendon of left rotator cuff   . Obesity   . PONV (postoperative nausea and vomiting)     Patient Active Problem List   Diagnosis Date Noted  . 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 Viral URI with cough 05/07/2016  . Acute cholecystitis 12/05/2014  . GERD (gastroesophageal reflux disease) 06/15/2013  . Smoking complicating pregnancy, antepartum 01/12/2013  . Lymphadenitis 09/11/2010  . DEPRESSION, MODERATE, RECURRENT 03/20/2010  . 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 INSOMNIA 01/06/2009  . TOBACCO ABUSE 11/22/2008  . ALLERGIC RHINITIS DUE TO ANIMAL HAIR AND DANDER 11/22/2008  . OBESITY 10/20/2008     Past Surgical History:  Procedure Laterality Date  . CHOLECYSTECTOMY N/A 12/05/2014   Procedure: LAPAROSCOPIC CHOLECYSTECTOMY WITH INTRAOPERATIVE CHOLANGIOGRAM;  Surgeon: Chevis Pretty III, MD;  Location: [facility] OR;  Service: General;  Laterality: N/A;  . TONSILLECTOMY     age 14  . tubes in ears  age 70    OB History    Gravida Para Term Preterm AB Living   4 2 2  0 2 2   SAB TAB Ectopic Multiple Live Births   2 0 0 0 2       Home Medications    Prior to Admission medications   Medication Sig Start Date End Date Taking? Authorizing Provider  acetaminophen (TYLENOL) 325 MG tablet Take 650 mg by mouth every 6 (six) hours as needed for moderate pain.     [provider]  ciprofloxacin (CILOXAN) 0.3 % ophthalmic ointment 1 to 2 drops into eyes  every 2 hours while awake for 2 days, then 1 to 2 drops every 4 hours while awake for next 5 days 09/17/16   Maxwell Caul, PA-C  diphenhydrAMINE (BENADRYL) 25 MG tablet Take 1 tablet (25 mg total) by mouth every 8 (eight) hours as needed. 08/23/16   Janne Napoleon, NP  HYDROcodone-acetaminophen (NORCO/VICODIN) 5-325 MG tablet Take 1 tablet by mouth every 6 (six) hours as needed for moderate pain. 05/17/16   Beaulah Dinning, MD  loperamide (IMODIUM A-D) 2 MG tablet Take  1 tablet (2 mg total) by mouth 4 (four) times daily as needed for diarrhea or loose stools. 06/26/16   Ardith DarkParker, Caleb M, MD  naproxen (NAPROSYN) 250 MG tablet Take 1 tablet (250 mg total) by mouth 2 (two) times daily with a meal. 05/17/16   Beaulah DinningGambino, Christina M, MD  ondansetron (ZOFRAN ODT) 4 MG disintegrating tablet Take 1 tablet (4 mg total) by mouth every 8 (eight) hours as needed for nausea or vomiting. 06/26/16   Ardith DarkParker, Caleb M, MD  triamcinolone cream (KENALOG) 0.1 % Apply 1 application topically 2 (two) times daily. 08/23/16   Janne NapoleonNeese, Hope M, NP    Family History Family History  Problem Relation Age of Onset  . Depression Mother   . Diabetes Mother   . Hypertension  Mother   . COPD Mother   . Anesthesia problems Mother   . Depression Sister   . Diabetes Sister   . Kidney disease Sister   . Hypertension Sister   . Hypertension Father   . Anesthesia problems Father   . COPD Father     Social History Social History  Substance Use Topics  . Smoking status: Current Every Day Smoker    Packs/day: 1.00    Years: 10.00    Types: Cigarettes  . Smokeless tobacco: Never Used  . Alcohol use No     Allergies   Cephalexin and Percocet [oxycodone-acetaminophen]   Review of Systems Review of Systems  Gastrointestinal: Positive for nausea. Negative for abdominal pain and vomiting.  Genitourinary: Positive for menstrual problem. Negative for vaginal discharge.  All other systems reviewed and are negative.    Physical Exam Updated Vital Signs BP 110/69 (BP Location: Left Arm)   Pulse (!) 57   Temp 98.1 F (36.7 C) (Oral)   Resp 16   SpO2 100%   Physical Exam  Constitutional: She is oriented to person, place, and time. She appears well-developed and well-nourished. No distress.  HENT:  Head: Normocephalic and atraumatic.  Eyes: EOM are normal.  Neck: Normal range of motion.  Cardiovascular: Normal rate, regular rhythm and normal heart sounds.   Pulmonary/Chest: Effort normal and breath sounds normal.  Abdominal: Soft. She exhibits no distension. There is no tenderness.  Musculoskeletal: Normal range of motion.  Neurological: She is alert and oriented to person, place, and time.  Skin: Skin is warm and dry.  Psychiatric: She has a normal mood and affect. Judgment normal.  Nursing note and vitals reviewed.    ED Treatments / Results  DIAGNOSTIC STUDIES:  Oxygen Saturation is 100% on RA, normal by my interpretation.    COORDINATION OF CARE:  3:49 PM Discussed treatment plan with pt at bedside and pt agreed to plan.  Labs (all labs ordered are listed, but only abnormal results are displayed) Labs Reviewed  COMPREHENSIVE  METABOLIC PANEL - Abnormal; Notable for the following:       Result Value   Glucose, Bld 104 (*)    ALT 12 (*)    All other components within normal limits  LIPASE, BLOOD  CBC  URINALYSIS, ROUTINE W REFLEX MICROSCOPIC  I-STAT BETA HCG BLOOD, ED ([facility], [facility], [facility] ONLY)    EKG  EKG Interpretation None       Radiology No results found.  Procedures Procedures (including critical care time)  Medications Ordered in ED Medications - No data to display   Initial Impression / Assessment and Plan / ED Course  I have reviewed the triage vital signs and the nursing notes.  Pertinent labs & imaging results that were available during my care of the patient were reviewed by me and considered in my medical decision making (see chart for details).     Pt concerned she is pregnant. She is not. Exam nonfocal. HD stable.   Final Clinical Impressions(s) / ED Diagnoses   Final diagnoses:  Nausea  Hot flashes    New Prescriptions New Prescriptions   No medications on file   I personally preformed the services scribed in my presence. The recorded information has been reviewed is accurate. Raeford Razor, MD.     Raeford Razor, MD 12/26/16 [phone]

## 2016-12-24 NOTE — ED Triage Notes (Signed)
Pt reports she has not had a menstrual cycle since May 25th. She also reports some nausea and hot flashes. She states that she has taken multiple pregnancy tests and they have all been negative. Denies any abd pain or vaginal d/c.

## 2017-01-31 ENCOUNTER — Emergency Department (HOSPITAL_COMMUNITY)
Admission: EM | Admit: 2017-01-31 | Discharge: 2017-01-31 | Discharge status: Against Medical Advice | Payer: BLUE CROSS/BLUE SHIELD

## 2017-02-19 ENCOUNTER — Ambulatory Visit (INDEPENDENT_AMBULATORY_CARE_PROVIDER_SITE_OTHER): Payer: Medicaid Other

## 2017-02-19 ENCOUNTER — Encounter: Payer: Self-pay | Admitting: Obstetrics & Gynecology

## 2017-02-19 DIAGNOSIS — Z3A01 Less than 8 weeks gestation of pregnancy: Secondary | ICD-10-CM

## 2017-02-19 DIAGNOSIS — Z3201 Encounter for pregnancy test, result positive: Secondary | ICD-10-CM

## 2017-02-19 LAB — POCT PREGNANCY, URINE: Preg Test, Ur: POSITIVE — AB

## 2017-02-19 NOTE — Progress Notes (Signed)
Patient presented to the office today for a pregnancy test. Her test does confirms she is pregnant around 3 weeks EDD 11/01/2017 according to her last period. Patient reports taking no medications at this time. I have advised her to start taking prenatal vitamins at this time. She has been advised to always contact our office or her provider regarding taking any medications. Patient voice understanding at this time. Patient was provided with a pregnancy verification letter to apply for assistance with this pregnancy.

## 2017-02-20 ENCOUNTER — Inpatient Hospital Stay (HOSPITAL_COMMUNITY): Payer: PRIVATE HEALTH INSURANCE

## 2017-02-20 ENCOUNTER — Inpatient Hospital Stay (HOSPITAL_COMMUNITY)
Admission: AD | Admit: 2017-02-20 | Discharge: 2017-02-21 | Disposition: A | Discharge status: Home / Self Care | Payer: PRIVATE HEALTH INSURANCE | Source: Ambulatory Visit | Attending: Obstetrics and Gynecology | Admitting: Obstetrics and Gynecology

## 2017-02-20 ENCOUNTER — Encounter (HOSPITAL_COMMUNITY): Payer: Self-pay | Admitting: *Deleted

## 2017-02-20 DIAGNOSIS — O99331 Smoking (tobacco) complicating pregnancy, first trimester: Secondary | ICD-10-CM | POA: Diagnosis not present

## 2017-02-20 DIAGNOSIS — F17210 Nicotine dependence, cigarettes, uncomplicated: Secondary | ICD-10-CM | POA: Diagnosis not present

## 2017-02-20 DIAGNOSIS — Z885 Allergy status to narcotic agent status: Secondary | ICD-10-CM | POA: Insufficient documentation

## 2017-02-20 DIAGNOSIS — R102 Pelvic and perineal pain: Secondary | ICD-10-CM | POA: Diagnosis present

## 2017-02-20 DIAGNOSIS — Z3A01 Less than 8 weeks gestation of pregnancy: Secondary | ICD-10-CM | POA: Diagnosis not present

## 2017-02-20 DIAGNOSIS — O26891 Other specified pregnancy related conditions, first trimester: Secondary | ICD-10-CM | POA: Diagnosis not present

## 2017-02-20 DIAGNOSIS — O3680X0 Pregnancy with inconclusive fetal viability, not applicable or unspecified: Secondary | ICD-10-CM

## 2017-02-20 LAB — URINALYSIS, ROUTINE W REFLEX MICROSCOPIC
Bilirubin Urine: NEGATIVE
Glucose, UA: NEGATIVE mg/dL
KETONES UR: NEGATIVE mg/dL
Nitrite: NEGATIVE
PH: 5 (ref 5.0–8.0)
PROTEIN: NEGATIVE mg/dL
Specific Gravity, Urine: 1.027 (ref 1.005–1.030)

## 2017-02-20 LAB — CBC
HEMATOCRIT: 38 % (ref 36.0–46.0)
HEMOGLOBIN: 13 g/dL (ref 12.0–15.0)
MCH: 30.3 pg (ref 26.0–34.0)
MCHC: 34.2 g/dL (ref 30.0–36.0)
MCV: 88.6 fL (ref 78.0–100.0)
Platelets: 215 10*3/uL (ref 150–400)
RBC: 4.29 MIL/uL (ref 3.87–5.11)
RDW: 13.7 % (ref 11.5–15.5)
WBC: 9 10*3/uL (ref 4.0–10.5)

## 2017-02-20 LAB — WET PREP, GENITAL
SPERM: NONE SEEN
TRICH WET PREP: NONE SEEN
YEAST WET PREP: NONE SEEN

## 2017-02-20 LAB — HCG, QUANTITATIVE, PREGNANCY: HCG, BETA CHAIN, QUANT, S: 1202 m[IU]/mL — AB (ref <5)

## 2017-02-20 NOTE — MAU Provider Note (Signed)
History     CSN: [id number]  Arrival date and time: 02/20/17 2036   First Provider Initiated Contact with Patient 02/20/17 2249      Chief Complaint  Patient presents with  . Pelvic Pain   Pelvic Pain  The patient's primary symptoms include pelvic pain. The patient's pertinent negatives include no vaginal discharge. This is a new problem. The current episode started today. The problem occurs constantly. The problem has been unchanged. Pain severity now: 4/10. The problem affects both sides. She is pregnant. Associated symptoms include nausea. Pertinent negatives include no chills, dysuria, fever, frequency, urgency or vomiting. The vaginal discharge was normal. There has been no bleeding. Nothing aggravates the symptoms. She has tried nothing for the symptoms. Menstrual history: LMP: had spotting in August, LNMP: 12/28/16.   Past Medical History:  Diagnosis Date  . Abnormal Pap smear    repeat WNL  . Back pain   . Cholelithiasis   . Depression    no meds since pregancny  . Family history of adverse reaction to anesthesia    " MOTHER TAKES ALONG TIME TO WAKE "  . Infection    UTI  . Injury of tendon of left rotator cuff   . Obesity   . PONV (postoperative nausea and vomiting)     Past Surgical History:  Procedure Laterality Date  . CHOLECYSTECTOMY N/A 12/05/2014   Procedure: LAPAROSCOPIC CHOLECYSTECTOMY WITH INTRAOPERATIVE CHOLANGIOGRAM;  Surgeon: Chevis Pretty III, MD;  Location: [facility] OR;  Service: General;  Laterality: N/A;  . TONSILLECTOMY     age 66  . tubes in ears  age 56    Family History  Problem Relation Age of Onset  . Depression Mother   . Diabetes Mother   . Hypertension Mother   . COPD Mother   . Anesthesia problems Mother   . Depression Sister   . Diabetes Sister   . Kidney disease Sister   . Hypertension Sister   . Hypertension Father   . Anesthesia problems Father   . COPD Father     Social History  Substance Use Topics  . Smoking status: Current  Every Day Smoker    Packs/day: 0.50    Years: 10.00    Types: Cigarettes  . Smokeless tobacco: Never Used  . Alcohol use No    Allergies:  Allergies  Allergen Reactions  . Cephalexin Itching and Nausea And Vomiting  . Percocet [Oxycodone-Acetaminophen] Nausea And Vomiting    Prescriptions Prior to Admission  Medication Sig Dispense Refill Last Dose  . acetaminophen (TYLENOL) 325 MG tablet Take 650 mg by mouth every 6 (six) hours as needed for moderate pain.    Past Month at Unknown time  . ciprofloxacin (CILOXAN) 0.3 % ophthalmic ointment 1 to 2 drops into eyes  every 2 hours while awake for 2 days, then 1 to 2 drops every 4 hours while awake for next 5 days (Patient not taking: Reported on 02/19/2017) 3.5 g 0 More than a month at Unknown time  . diphenhydrAMINE (BENADRYL) 25 MG tablet Take 1 tablet (25 mg total) by mouth every 8 (eight) hours as needed. (Patient not taking: Reported on 02/19/2017) 20 tablet 0 More than a month at Unknown time  . HYDROcodone-acetaminophen (NORCO/VICODIN) 5-325 MG tablet Take 1 tablet by mouth every 6 (six) hours as needed for moderate pain. (Patient not taking: Reported on 02/19/2017) 10 tablet 0 More than a month at Unknown time  . loperamide (IMODIUM A-D) 2 MG tablet Take 1 tablet (  2 mg total) by mouth 4 (four) times daily as needed for diarrhea or loose stools. (Patient not taking: Reported on 02/19/2017) 30 tablet 0 More than a month at Unknown time  . naproxen (NAPROSYN) 250 MG tablet Take 1 tablet (250 mg total) by mouth 2 (two) times daily with a meal. (Patient not taking: Reported on 02/19/2017) 60 tablet 2 More than a month at Unknown time  . ondansetron (ZOFRAN ODT) 4 MG disintegrating tablet Take 1 tablet (4 mg total) by mouth every 8 (eight) hours as needed for nausea or vomiting. (Patient not taking: Reported on 02/19/2017) 20 tablet 0 More than a month at Unknown time  . triamcinolone cream (KENALOG) 0.1 % Apply 1 application topically 2 (two) times daily.  (Patient not taking: Reported on 02/19/2017) 30 g 0 More than a month at Unknown time    Review of Systems  Constitutional: Negative for chills and fever.  Gastrointestinal: Positive for nausea. Negative for vomiting.  Genitourinary: Positive for pelvic pain. Negative for dysuria, frequency, urgency, vaginal bleeding and vaginal discharge.   Physical Exam   Blood pressure 113/71, pulse 81, temperature 98 F (36.7 C), temperature source Oral, resp. rate 18, height  (1.549 m), weight 151 lb 4 oz (68.6 kg), last menstrual period 01/24/2017.  Physical Exam  Nursing note and vitals reviewed. Constitutional: She is oriented to person, place, and time. She appears well-developed and well-nourished. No distress.  HENT:  Head: Normocephalic.  Cardiovascular: Normal rate.   Respiratory: Effort normal.  GI: Soft. There is no tenderness. There is no rebound.  Neurological: She is alert and oriented to person, place, and time.  Skin: Skin is warm and dry.  Psychiatric: She has a normal mood and affect.     Results for orders placed or performed during the hospital encounter of 02/20/17 (from the past 24 hour(s))  Urinalysis, Routine w reflex microscopic     Status: Abnormal   Collection Time: 02/20/17  9:07 PM  Result Value Ref Range   Color, Urine YELLOW YELLOW   APPearance CLOUDY (A) CLEAR   Specific Gravity, Urine 1.027 1.005 - 1.030   pH 5.0 5.0 - 8.0   Glucose, UA NEGATIVE NEGATIVE mg/dL   Hgb urine dipstick SMALL (A) NEGATIVE   Bilirubin Urine NEGATIVE NEGATIVE   Ketones, ur NEGATIVE NEGATIVE mg/dL   Protein, ur NEGATIVE NEGATIVE mg/dL   Nitrite NEGATIVE NEGATIVE   Leukocytes, UA TRACE (A) NEGATIVE   RBC / HPF 6-30 0 - 5 RBC/hpf   WBC, UA 0-5 0 - 5 WBC/hpf   Bacteria, UA RARE (A) NONE SEEN   Squamous Epithelial / LPF 6-30 (A) NONE SEEN   Mucus PRESENT    Ca Oxalate Crys, UA PRESENT   CBC     Status: None   Collection Time: 02/20/17 11:08 PM  Result Value Ref Range   WBC  9.0 4.0 - 10.5 K/uL   RBC 4.29 3.87 - 5.11 MIL/uL   Hemoglobin 13.0 12.0 - 15.0 g/dL   HCT 16.1 09.6 - 04.5 %   MCV 88.6 78.0 - 100.0 fL   MCH 30.3 26.0 - 34.0 pg   MCHC 34.2 30.0 - 36.0 g/dL   RDW 40.9 81.1 - 91.4 %   Platelets 215 150 - 400 K/uL  hCG, quantitative, pregnancy     Status: Abnormal   Collection Time: 02/20/17 11:08 PM  Result Value Ref Range   hCG, Beta Chain, Quant, S 1,202 (H) <5 mIU/mL  Wet prep, genital  Status: Abnormal   Collection Time: 02/20/17 11:10 PM  Result Value Ref Range   Yeast Wet Prep HPF POC NONE SEEN NONE SEEN   Trich, Wet Prep NONE SEEN NONE SEEN   Clue Cells Wet Prep HPF POC PRESENT (A) NONE SEEN   WBC, Wet Prep HPF POC MODERATE (A) NONE SEEN   Sperm NONE SEEN     MAU Course  Procedures  MDM   Assessment and Plan   1. Pregnancy, location unknown    DC home Comfort measures reviewed  1st Trimester precautions  Bleeding precautions Ectopic precautions RX: none  Return to MAU as needed FU with OB as planned  Follow-up Information    [facility] Follow up.   Why:  RETURN SUNDAY MORNING FOR REPEAT BLOODWORK  Contact information: [address] [id number] [place] [place] [id number] [phone]          Thressa ShellerHeather Hogan 02/20/2017, 10:52 PM

## 2017-02-20 NOTE — MAU Note (Signed)
PT SAYS  SHE DID HPT  ON Tuesday- POSITIVE, SO YESTERDAY  SHE CAME TO CLINIC- POSITIVE UPT.     THEN TODAY SHE STARTED CRAMPING AT WORK. .  NO VAG BLEEDING     NO BIRTH CONTROL. LAST SEX-     SAT

## 2017-02-21 LAB — GC/CHLAMYDIA PROBE AMP (~~LOC~~) NOT AT ARMC
CHLAMYDIA, DNA PROBE: NEGATIVE
NEISSERIA GONORRHEA: NEGATIVE

## 2017-02-21 LAB — RPR: RPR Ser Ql: NONREACTIVE

## 2017-02-21 LAB — HIV ANTIBODY (ROUTINE TESTING W REFLEX): HIV SCREEN 4TH GENERATION: NONREACTIVE

## 2017-02-21 NOTE — Discharge Instructions (Signed)
Ectopic Pregnancy An ectopic pregnancy is when the fertilized egg attaches (implants) outside the uterus. Most ectopic pregnancies occur in one of the tubes where eggs travel from the ovary to the uterus (fallopian tubes), but the implanting can occur in other locations. In rare cases, ectopic pregnancies occur on the ovary, intestine, pelvis, abdomen, or cervix. In an ectopic pregnancy, the fertilized egg does not have the ability to develop into a normal, healthy baby. A ruptured ectopic pregnancy is one in which tearing or bursting of a fallopian tube causes internal bleeding. Often, there is intense lower abdominal pain, and vaginal bleeding sometimes occurs. Having an ectopic pregnancy can be life-threatening. If this dangerous condition is not treated, it can lead to blood loss, shock, or even death. What are the causes? The most common cause of this condition is damage to one of the fallopian tubes. A fallopian tube may be narrowed or blocked, and that keeps the fertilized egg from reaching the uterus. What increases the risk? This condition is more likely to develop in women of childbearing age who have different levels of risk. The levels of risk can be divided into three categories. High risk  You have gone through infertility treatment.  You have had an ectopic pregnancy before.  You have had surgery on the fallopian tubes, or another surgical procedure, such as an abortion.  You have had surgery to have the fallopian tubes tied (tubal ligation).  You have problems or diseases of the fallopian tubes.  You have been exposed to diethylstilbestrol (DES). This medicine was used until 1971, and it had effects on babies whose mothers took the medicine.  You become pregnant while using an IUD (intrauterine device) for birth control. Moderate risk  You have a history of infertility.  You have had an STI (sexually transmitted infection).  You have a history of pelvic inflammatory  disease (PID).  You have scarring from endometriosis.  You have multiple sexual partners.  You smoke. Low risk  You have had pelvic surgery.  You use vaginal douches.  You became sexually active before age 18. What are the signs or symptoms? Common symptoms of this condition include normal pregnancy symptoms, such as missing a period, nausea, tiredness, abdominal pain, breast tenderness, and bleeding. However, ectopic pregnancy will have additional symptoms, such as:  Pain with intercourse.  Irregular vaginal bleeding or spotting.  Cramping or pain on one side or in the lower abdomen.  Fast heartbeat, low blood pressure, and sweating.  Passing out while having a bowel movement.  Symptoms of a ruptured ectopic pregnancy and internal bleeding may include:  Sudden, severe pain in the abdomen and pelvis.  Dizziness, weakness, light-headedness, or fainting.  Pain in the shoulder or neck area.  How is this diagnosed? This condition is diagnosed by:  A pelvic exam to locate pain or a mass in the abdomen.  A pregnancy test. This blood test checks for the presence as well as the specific level of pregnancy hormone in the bloodstream.  Ultrasound. This is performed if a pregnancy test is positive. In this test, a probe is inserted into the vagina. The probe will detect a fetus, possibly in a location other than the uterus.  Taking a sample of uterus tissue (dilation and curettage, or D&C).  Surgery to perform a visual exam of the inside of the abdomen using a thin, lighted tube that has a tiny camera on the end (laparoscope).  Culdocentesis. This procedure involves inserting a needle at the top   of the vagina, behind the uterus. If blood is present in this area, it may indicate that a fallopian tube is torn.  How is this treated? This condition is treated with medicine or surgery. Medicine  An injection of a medicine (methotrexate) may be given to cause the pregnancy tissue  to be absorbed. This medicine may save your fallopian tube. It may be given if: ? The diagnosis is made early, with no signs of active bleeding. ? The fallopian tube has not ruptured. ? You are considered to be a good candidate for the medicine. Usually, pregnancy hormone blood levels are checked after methotrexate treatment. This is to be sure that the medicine is effective. It may take 4-6 weeks for the pregnancy to be absorbed. Most pregnancies will be absorbed by 3 weeks. Surgery  A laparoscope may be used to remove the pregnancy tissue.  If severe internal bleeding occurs, a larger cut (incision) may be made in the lower abdomen (laparotomy) to remove the fetus and placenta. This is done to stop the bleeding.  Part or all of the fallopian tube may be removed (salpingectomy) along with the fetus and placenta. The fallopian tube may also be repaired during the surgery.  In very rare circumstances, removal of the uterus (hysterectomy) may be required.  After surgery, pregnancy hormone testing may be done to be sure that there is no pregnancy tissue left. Whether your treatment is medicine or surgery, you may receive a Rho (D) immune globulin shot to prevent problems with any future pregnancy. This shot may be given if:  You are Rh-negative and the baby's father is Rh-positive.  You are Rh-negative and you do not know the Rh type of the baby's father.  Follow these instructions at home:  Rest and limit your activity after the procedure for as long as told by your health care provider.  Until your health care provider says that it is safe: ? Do not lift anything that is heavier than 10 lb (4.5 kg), or the limit that your health care provider tells you. ? Avoid physical exercise and any movement that requires effort (is strenuous).  To help prevent constipation: ? Eat a healthy diet that includes fruits, vegetables, and whole grains. ? Drink 6-8 glasses of water per day. Get help  right away if:  You develop worsening pain that is not relieved by medicine.  You have: ? A fever or chills. ? Vaginal bleeding. ? Redness and swelling at the incision site. ? Nausea and vomiting.  You feel dizzy or weak.  You feel light-headed or you faint. This information is not intended to replace advice given to you by your health care provider. Make sure you discuss any questions you have with your health care provider. Document Released: 07/11/2004 Document Revised: 01/31/2016 Document Reviewed: 01/03/2016 Elsevier Interactive Patient Education  2018 [employer]  

## 2017-02-23 ENCOUNTER — Inpatient Hospital Stay (HOSPITAL_COMMUNITY)
Admission: AD | Admit: 2017-02-23 | Discharge: 2017-02-23 | Disposition: A | Discharge status: Home / Self Care | Payer: Medicaid Other | Source: Ambulatory Visit | Attending: Obstetrics and Gynecology | Admitting: Obstetrics and Gynecology

## 2017-02-23 DIAGNOSIS — Z885 Allergy status to narcotic agent status: Secondary | ICD-10-CM | POA: Diagnosis not present

## 2017-02-23 DIAGNOSIS — F17210 Nicotine dependence, cigarettes, uncomplicated: Secondary | ICD-10-CM | POA: Diagnosis not present

## 2017-02-23 DIAGNOSIS — O99331 Smoking (tobacco) complicating pregnancy, first trimester: Secondary | ICD-10-CM | POA: Diagnosis not present

## 2017-02-23 DIAGNOSIS — O26891 Other specified pregnancy related conditions, first trimester: Secondary | ICD-10-CM | POA: Insufficient documentation

## 2017-02-23 DIAGNOSIS — R109 Unspecified abdominal pain: Secondary | ICD-10-CM

## 2017-02-23 DIAGNOSIS — Z3A01 Less than 8 weeks gestation of pregnancy: Secondary | ICD-10-CM | POA: Diagnosis not present

## 2017-02-23 DIAGNOSIS — O26899 Other specified pregnancy related conditions, unspecified trimester: Secondary | ICD-10-CM

## 2017-02-23 LAB — HCG, QUANTITATIVE, PREGNANCY: HCG, BETA CHAIN, QUANT, S: 3655 m[IU]/mL — AB (ref <5)

## 2017-02-23 NOTE — Discharge Instructions (Signed)
Return to care  °· If you have heavier bleeding that soaks through more that 2 pads per hour for an hour or more °· If you bleed so much that you feel like you might pass out or you do pass out °· If you have significant abdominal pain that is not improved with Tylenol  °· If you develop a fever > 100.5 ° ° ° °First Trimester of Pregnancy °The first trimester of pregnancy is from week 1 until the end of week 13 (months 1 through 3). During this time, your baby will begin to develop inside you. At 6-8 weeks, the eyes and face are formed, and the heartbeat can be seen on ultrasound. At the end of 12 weeks, all the baby's organs are formed. Prenatal care is all the medical care you receive before the birth of your baby. Make sure you get good prenatal care and follow all of your doctor's instructions. °Follow these instructions at home: °Medicines °· Take over-the-counter and prescription medicines only as told by your doctor. Some medicines are safe and some medicines are not safe during pregnancy. °· Take a prenatal vitamin that contains at least 600 micrograms (mcg) of folic acid. °· If you have trouble pooping (constipation), take medicine that will make your stool soft (stool softener) if your doctor approves. °Eating and drinking °· Eat regular, healthy meals. °· Your doctor will tell you the amount of weight gain that is right for you. °· Avoid raw meat and uncooked cheese. °· If you feel sick to your stomach (nauseous) or throw up (vomit): °? Eat 4 or 5 small meals a day instead of 3 large meals. °? Try eating a few soda crackers. °? Drink liquids between meals instead of during meals. °· To prevent constipation: °? Eat foods that are high in fiber, like fresh fruits and vegetables, whole grains, and beans. °? Drink enough fluids to keep your pee (urine) clear or pale yellow. °Activity °· Exercise only as told by your doctor. Stop exercising if you have cramps or pain in your lower belly (abdomen) or low  back. °· Do not exercise if it is too hot, too humid, or if you are in a place of great height (high altitude). °· Try to avoid standing for long periods of time. Move your legs often if you must stand in one place for a long time. °· Avoid heavy lifting. °· Wear low-heeled shoes. Sit and stand up straight. °· You can have sex unless your doctor tells you not to. °Relieving pain and discomfort °· Wear a good support bra if your breasts are sore. °· Take warm water baths (sitz baths) to soothe pain or discomfort caused by hemorrhoids. Use hemorrhoid cream if your doctor says it is okay. °· Rest with your legs raised if you have leg cramps or low back pain. °· If you have puffy, bulging veins (varicose veins) in your legs: °? Wear support hose or compression stockings as told by your doctor. °? Raise (elevate) your feet for 15 minutes, 3-4 times a day. °? Limit salt in your food. °Prenatal care °· Schedule your prenatal visits by the twelfth week of pregnancy. °· Write down your questions. Take them to your prenatal visits. °· Keep all your prenatal visits as told by your doctor. This is important. °Safety °· Wear your seat belt at all times when driving. °· Make a list of emergency phone numbers. The list should include numbers for family, friends, the hospital, and police and   fire departments. °General instructions °· Ask your doctor for a referral to a local prenatal class. Begin classes no later than at the start of month 6 of your pregnancy. °· Ask for help if you need counseling or if you need help with nutrition. Your doctor can give you advice or tell you where to go for help. °· Do not use hot tubs, steam rooms, or saunas. °· Do not douche or use tampons or scented sanitary pads. °· Do not cross your legs for long periods of time. °· Avoid all herbs and alcohol. Avoid drugs that are not approved by your doctor. °· Do not use any tobacco products, including cigarettes, chewing tobacco, and electronic  cigarettes. If you need help quitting, ask your doctor. You may get counseling or other support to help you quit. °· Avoid cat litter boxes and soil used by cats. These carry germs that can cause birth defects in the baby and can cause a loss of your baby (miscarriage) or stillbirth. °· Visit your dentist. At home, brush your teeth with a soft toothbrush. Be gentle when you floss. °Contact a doctor if: °· You are dizzy. °· You have mild cramps or pressure in your lower belly. °· You have a nagging pain in your belly area. °· You continue to feel sick to your stomach, you throw up, or you have watery poop (diarrhea). °· You have a bad smelling fluid coming from your vagina. °· You have pain when you pee (urinate). °· You have increased puffiness (swelling) in your face, hands, legs, or ankles. °Get help right away if: °· You have a fever. °· You are leaking fluid from your vagina. °· You have spotting or bleeding from your vagina. °· You have very bad belly cramping or pain. °· You gain or lose weight rapidly. °· You throw up blood. It may look like coffee grounds. °· You are around people who have German measles, fifth disease, or chickenpox. °· You have a very bad headache. °· You have shortness of breath. °· You have any kind of trauma, such as from a fall or a car accident. °Summary °· The first trimester of pregnancy is from week 1 until the end of week 13 (months 1 through 3). °· To take care of yourself and your unborn baby, you will need to eat healthy meals, take medicines only if your doctor tells you to do so, and do activities that are safe for you and your baby. °· Keep all follow-up visits as told by your doctor. This is important as your doctor will have to ensure that your baby is healthy and growing well. °This information is not intended to replace advice given to you by your health care provider. Make sure you discuss any questions you have with your health care provider. °Document Released:  11/20/2007 Document Revised: 06/11/2016 Document Reviewed: 06/11/2016 °Elsevier Interactive Patient Education © 2017 [employer] Inc. ° °

## 2017-02-23 NOTE — MAU Provider Note (Signed)
History   [id number]   Chief Complaint  Patient presents with  . Follow-up    HPI Maureen Perez is a 31 y.o. female [phone] here for follow-up BHCG.  Upon review of the records patient was first seen on 9/6 for pelvic pain. BHCG on that day was 1202. Ultrasound showed no IUP or adnexal mass. GC/CT and wet prep were collected.  Results were negative. Pt discharged home with ectopic precautions & instructed to f/u for repeat BHCG. Pt here today with no report of abdominal pain or vaginal bleeding.    Patient's last menstrual period was 01/24/2017.  OB History  Gravida Para Term Preterm AB Living  0 2 2  SAB TAB Ectopic Multiple Live Births  2 0 0 0 2    # Outcome Date GA Lbr Len/2nd Weight Sex Delivery Anes PTL Lv  5 Current           4 Term 08/09/13 [redacted]w[redacted]d 04:44 / 00:08 5 lb 9.6 oz (2.54 kg) F Vag-Spont EPI  LIV  3 Term 09/01/11 [redacted]w[redacted]d 21:43 / 01:13 6 lb 2.9 oz (2.805 kg) F Vag-Spont EPI  LIV     Birth Comments: No anomalies noted  2 SAB           1 SAB  [redacted]w[redacted]d             Past Medical History:  Diagnosis Date  . Abnormal Pap smear    repeat WNL  . Back pain   . Cholelithiasis   . Depression    no meds since pregancny  . Family history of adverse reaction to anesthesia    " MOTHER TAKES ALONG TIME TO WAKE "  . Infection    UTI  . Injury of tendon of left rotator cuff   . Obesity   . PONV (postoperative nausea and vomiting)     Family History  Problem Relation Age of Onset  . Depression Mother   . Diabetes Mother   . Hypertension Mother   . COPD Mother   . Anesthesia problems Mother   . Depression Sister   . Diabetes Sister   . Kidney disease Sister   . Hypertension Sister   . Hypertension Father   . Anesthesia problems Father   . COPD Father     Social History   Social History  . Marital status: Single    Spouse name: N/A  . Number of children: N/A  . Years of education: N/A   Social History Main Topics  . Smoking status: Current Every Day  Smoker    Packs/day: 0.50    Years: 10.00    Types: Cigarettes  . Smokeless tobacco: Never Used  . Alcohol use No  . Drug use: No  . Sexual activity: Yes    Birth control/ protection: Condom   Other Topics Concern  . Not on file   Social History Narrative  . No narrative on file    Allergies  Allergen Reactions  . Cephalexin Itching and Nausea And Vomiting  . Percocet [Oxycodone-Acetaminophen] Nausea And Vomiting    No current facility-administered medications on file prior to encounter.    Current Outpatient Prescriptions on File Prior to Encounter  Medication Sig Dispense Refill  . acetaminophen (TYLENOL) 325 MG tablet Take 650 mg by mouth every 6 (six) hours as needed for moderate pain.     . ciprofloxacin (CILOXAN) 0.3 % ophthalmic ointment 1 to 2 drops into eyes  every 2 hours while awake  for 2 days, then 1 to 2 drops every 4 hours while awake for next 5 days (Patient not taking: Reported on 02/19/2017) 3.5 g 0  . diphenhydrAMINE (BENADRYL) 25 MG tablet Take 1 tablet (25 mg total) by mouth every 8 (eight) hours as needed. (Patient not taking: Reported on 02/19/2017) 20 tablet 0  . HYDROcodone-acetaminophen (NORCO/VICODIN) 5-325 MG tablet Take 1 tablet by mouth every 6 (six) hours as needed for moderate pain. (Patient not taking: Reported on 02/19/2017) 10 tablet 0  . loperamide (IMODIUM A-D) 2 MG tablet Take 1 tablet (2 mg total) by mouth 4 (four) times daily as needed for diarrhea or loose stools. (Patient not taking: Reported on 02/19/2017) 30 tablet 0  . naproxen (NAPROSYN) 250 MG tablet Take 1 tablet (250 mg total) by mouth 2 (two) times daily with a meal. (Patient not taking: Reported on 02/19/2017) 60 tablet 2  . ondansetron (ZOFRAN ODT) 4 MG disintegrating tablet Take 1 tablet (4 mg total) by mouth every 8 (eight) hours as needed for nausea or vomiting. (Patient not taking: Reported on 02/19/2017) 20 tablet 0  . triamcinolone cream (KENALOG) 0.1 % Apply 1 application topically 2  (two) times daily. (Patient not taking: Reported on 02/19/2017) 30 g 0     Physical Exam   Vitals:   02/23/17 0812  BP: 119/75  Pulse: 82  Resp: 16  Temp: 97.8 F (36.6 C)  TempSrc: Oral    Physical Exam  Nursing note and vitals reviewed. Constitutional: She is oriented to person, place, and time. She appears well-developed and well-nourished. No distress.  HENT:  Head: Normocephalic and atraumatic.  Mouth/Throat: Dental caries present.  Eyes: Conjunctivae are normal. Right eye exhibits no discharge. Left eye exhibits no discharge. No scleral icterus.  Neck: Normal range of motion.  Respiratory: Effort normal. No respiratory distress.  Neurological: She is alert and oriented to person, place, and time.  Skin: She is not diaphoretic.  Psychiatric: She has a normal mood and affect. Her behavior is normal. Judgment and thought content normal.    MAU Course  Procedures Results for orders placed or performed during the hospital encounter of 02/23/17 (from the past 24 hour(s))  hCG, quantitative, pregnancy     Status: Abnormal   Collection Time: 02/23/17  8:20 AM  Result Value Ref Range   hCG, Beta Chain, Quant, S 3,655 (H) <5 mIU/mL    MDM Appropriate rise in BHCG. Denies abdominal pain or vaginal bleeding. Will order OP ultrasound for viability. Component     Latest Ref Rng & Units 02/20/2017 02/23/2017  HCG, Beta Chain, Quant, S     <5 mIU/mL 1,202 (H) 3,655 (H)    Assessment and Plan  30 y.o. [id number] at 6940w2d wks Pregnancy Follow-up BHCG Pregnancy of Unknown Location  P: Discharge home Discussed reasons to return to MAU OP ultrasound for viability List of ob/gyn providers, pregnancy verification letter, & OTC Meds safe in pregnancy given  [name], Jeral Zick, NP 02/23/2017 9:37 AM

## 2017-02-23 NOTE — MAU Note (Signed)
Pt here for repeat BHCG.  Pt denies pain or bleeding. 

## 2017-03-06 ENCOUNTER — Ambulatory Visit: Payer: Self-pay | Admitting: *Deleted

## 2017-03-06 ENCOUNTER — Ambulatory Visit (HOSPITAL_COMMUNITY)
Admission: RE | Admit: 2017-03-06 | Discharge: 2017-03-06 | Disposition: A | Discharge status: Home / Self Care | Payer: Medicaid Other | Source: Ambulatory Visit | Attending: Student | Admitting: Student

## 2017-03-06 DIAGNOSIS — Z3A01 Less than 8 weeks gestation of pregnancy: Secondary | ICD-10-CM | POA: Insufficient documentation

## 2017-03-06 DIAGNOSIS — R109 Unspecified abdominal pain: Secondary | ICD-10-CM | POA: Diagnosis present

## 2017-03-06 DIAGNOSIS — O26899 Other specified pregnancy related conditions, unspecified trimester: Secondary | ICD-10-CM | POA: Diagnosis present

## 2017-03-06 NOTE — Progress Notes (Signed)
Pt here for pregnancy verification. UPT pos. Letter given.  Katrinka Blazing, [place], CNM 03/06/2017 10:14 AM

## 2017-03-06 NOTE — Progress Notes (Signed)
Here for results of [place]. Reviewed [place] with Dorathy Kinsman, CNM and informed patient US shows live baby and she should start prenatal care 8-12 weeks.  Reviewed meds with her. Sent to registrar to check out and discuss ob options.

## 2017-03-06 NOTE — Progress Notes (Signed)
Reviewed US showing live IUP. Pt instructed to start [facility].   Katrinka Blazing, [place], CNM 03/06/2017 10:10 AM

## 2017-03-24 ENCOUNTER — Encounter (HOSPITAL_COMMUNITY): Payer: Self-pay | Admitting: *Deleted

## 2017-03-24 ENCOUNTER — Inpatient Hospital Stay (HOSPITAL_COMMUNITY)
Admission: AD | Admit: 2017-03-24 | Discharge: 2017-03-24 | Disposition: A | Discharge status: Home / Self Care | Payer: Medicaid Other | Source: Ambulatory Visit | Attending: Obstetrics and Gynecology | Admitting: Obstetrics and Gynecology

## 2017-03-24 DIAGNOSIS — O9989 Other specified diseases and conditions complicating pregnancy, childbirth and the puerperium: Secondary | ICD-10-CM

## 2017-03-24 DIAGNOSIS — G8929 Other chronic pain: Secondary | ICD-10-CM | POA: Diagnosis not present

## 2017-03-24 DIAGNOSIS — Z3A08 8 weeks gestation of pregnancy: Secondary | ICD-10-CM | POA: Diagnosis not present

## 2017-03-24 DIAGNOSIS — F17210 Nicotine dependence, cigarettes, uncomplicated: Secondary | ICD-10-CM | POA: Insufficient documentation

## 2017-03-24 DIAGNOSIS — O99331 Smoking (tobacco) complicating pregnancy, first trimester: Secondary | ICD-10-CM | POA: Diagnosis not present

## 2017-03-24 DIAGNOSIS — R109 Unspecified abdominal pain: Secondary | ICD-10-CM | POA: Diagnosis not present

## 2017-03-24 DIAGNOSIS — M545 Low back pain, unspecified: Secondary | ICD-10-CM

## 2017-03-24 DIAGNOSIS — O26891 Other specified pregnancy related conditions, first trimester: Secondary | ICD-10-CM | POA: Diagnosis not present

## 2017-03-24 LAB — URINALYSIS, ROUTINE W REFLEX MICROSCOPIC
Bilirubin Urine: NEGATIVE
GLUCOSE, UA: NEGATIVE mg/dL
HGB URINE DIPSTICK: NEGATIVE
KETONES UR: NEGATIVE mg/dL
LEUKOCYTES UA: NEGATIVE
NITRITE: NEGATIVE
PH: 5 (ref 5.0–8.0)
Protein, ur: NEGATIVE mg/dL
Specific Gravity, Urine: 1.015 (ref 1.005–1.030)

## 2017-03-24 NOTE — MAU Note (Signed)
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 Started having lower abdominal pain last night. Denies any VB

## 2017-03-24 NOTE — Discharge Instructions (Signed)
First Trimester of Pregnancy The first trimester of pregnancy is from week 1 until the end of week 13 (months 1 through 3). A week after a sperm fertilizes an egg, the egg will implant on the wall of the uterus. This embryo will begin to develop into a baby. Genes from you and your partner will form the baby. The female genes will determine whether the baby will be a boy or a girl. At 6-8 weeks, the eyes and face will be formed, and the heartbeat can be seen on ultrasound. At the end of 12 weeks, all the baby's organs will be formed. Now that you are pregnant, you will want to do everything you can to have a healthy baby. Two of the most important things are to get good prenatal care and to follow your health care provider's instructions. Prenatal care is all the medical care you receive before the baby's birth. This care will help prevent, find, and treat any problems during the pregnancy and childbirth. Body changes during your first trimester Your body goes through many changes during pregnancy. The changes vary from woman to woman.  You may gain or lose a couple of pounds at first.  You may feel sick to your stomach (nauseous) and you may throw up (vomit). If the vomiting is uncontrollable, call your health care provider.  You may tire easily.  You may develop headaches that can be relieved by medicines. All medicines should be approved by your health care provider.  You may urinate more often. Painful urination may mean you have a bladder infection.  You may develop heartburn as a result of your pregnancy.  You may develop constipation because certain hormones are causing the muscles that push stool through your intestines to slow down.  You may develop hemorrhoids or swollen veins (varicose veins).  Your breasts may begin to grow larger and become tender. Your nipples may stick out more, and the tissue that surrounds them (areola) may become darker.  Your gums may bleed and may be  sensitive to brushing and flossing.  Dark spots or blotches (chloasma, mask of pregnancy) may develop on your face. This will likely fade after the baby is born.  Your menstrual periods will stop.  You may have a loss of appetite.  You may develop cravings for certain kinds of food.  You may have changes in your emotions from day to day, such as being excited to be pregnant or being concerned that something may go wrong with the pregnancy and baby.  You may have more vivid and strange dreams.  You may have changes in your hair. These can include thickening of your hair, rapid growth, and changes in texture. Some women also have hair loss during or after pregnancy, or hair that feels dry or thin. Your hair will most likely return to normal after your baby is born.  What to expect at prenatal visits During a routine prenatal visit:  You will be weighed to make sure you and the baby are growing normally.  Your blood pressure will be taken.  Your abdomen will be measured to track your baby's growth.  The fetal heartbeat will be listened to between weeks 10 and 14 of your pregnancy.  Test results from any previous visits will be discussed.  Your health care provider may ask you:  How you are feeling.  If you are feeling the baby move.  If you have had any abnormal symptoms, such as leaking fluid, bleeding, severe headaches,   or abdominal cramping.  If you are using any tobacco products, including cigarettes, chewing tobacco, and electronic cigarettes.  If you have any questions.  Other tests that may be performed during your first trimester include:  Blood tests to find your blood type and to check for the presence of any previous infections. The tests will also be used to check for low iron levels (anemia) and protein on red blood cells (Rh antibodies). Depending on your risk factors, or if you previously had diabetes during pregnancy, you may have tests to check for high blood  sugar that affects pregnant women (gestational diabetes).  Urine tests to check for infections, diabetes, or protein in the urine.  An ultrasound to confirm the proper growth and development of the baby.  Fetal screens for spinal cord problems (spina bifida) and Down syndrome.  HIV (human immunodeficiency virus) testing. Routine prenatal testing includes screening for HIV, unless you choose not to have this test.  You may need other tests to make sure you and the baby are doing well.  Follow these instructions at home: Medicines  Follow your health care provider's instructions regarding medicine use. Specific medicines may be either safe or unsafe to take during pregnancy.  Take a prenatal vitamin that contains at least 600 micrograms (mcg) of folic acid.  If you develop constipation, try taking a stool softener if your health care provider approves. Eating and drinking  Eat a balanced diet that includes fresh fruits and vegetables, whole grains, good sources of protein such as meat, eggs, or tofu, and low-fat dairy. Your health care provider will help you determine the amount of weight gain that is right for you.  Avoid raw meat and uncooked cheese. These carry germs that can cause birth defects in the baby.  Eating four or five small meals rather than three large meals a day may help relieve nausea and vomiting. If you start to feel nauseous, eating a few soda crackers can be helpful. Drinking liquids between meals, instead of during meals, also seems to help ease nausea and vomiting.  Limit foods that are high in fat and processed sugars, such as fried and sweet foods.  To prevent constipation: ? Eat foods that are high in fiber, such as fresh fruits and vegetables, whole grains, and beans. ? Drink enough fluid to keep your urine clear or pale yellow. Activity  Exercise only as directed by your health care provider. Most women can continue their usual exercise routine during  pregnancy. Try to exercise for 30 minutes at least 5 days a week. Exercising will help you: ? Control your weight. ? Stay in shape. ? Be prepared for labor and delivery.  Experiencing pain or cramping in the lower abdomen or lower back is a good sign that you should stop exercising. Check with your health care provider before continuing with normal exercises.  Try to avoid standing for long periods of time. Move your legs often if you must stand in one place for a long time.  Avoid heavy lifting.  Wear low-heeled shoes and practice good posture.  You may continue to have sex unless your health care provider tells you not to. Relieving pain and discomfort  Wear a good support bra to relieve breast tenderness.  Take warm sitz baths to soothe any pain or discomfort caused by hemorrhoids. Use hemorrhoid cream if your health care provider approves.  Rest with your legs elevated if you have leg cramps or low back pain.  If you develop   varicose veins in your legs, wear support hose. Elevate your feet for 15 minutes, 3-4 times a day. Limit salt in your diet. Prenatal care  Schedule your prenatal visits by the twelfth week of pregnancy. They are usually scheduled monthly at first, then more often in the last 2 months before delivery.  Write down your questions. Take them to your prenatal visits.  Keep all your prenatal visits as told by your health care provider. This is important. Safety  Wear your seat belt at all times when driving.  Make a list of emergency phone numbers, including numbers for family, friends, the hospital, and police and fire departments. General instructions  Ask your health care provider for a referral to a local prenatal education class. Begin classes no later than the beginning of month 6 of your pregnancy.  Ask for help if you have counseling or nutritional needs during pregnancy. Your health care provider can offer advice or refer you to specialists for help  with various needs.  Do not use hot tubs, steam rooms, or saunas.  Do not douche or use tampons or scented sanitary pads.  Do not cross your legs for long periods of time.  Avoid cat litter boxes and soil used by cats. These carry germs that can cause birth defects in the baby and possibly loss of the fetus by miscarriage or stillbirth.  Avoid all smoking, herbs, alcohol, and medicines not prescribed by your health care provider. Chemicals in these products affect the formation and growth of the baby.  Do not use any products that contain nicotine or tobacco, such as cigarettes and e-cigarettes. If you need help quitting, ask your health care provider. You may receive counseling support and other resources to help you quit.  Schedule a dentist appointment. At home, brush your teeth with a soft toothbrush and be gentle when you floss. Contact a health care provider if:  You have dizziness.  You have mild pelvic cramps, pelvic pressure, or nagging pain in the abdominal area.  You have persistent nausea, vomiting, or diarrhea.  You have a bad smelling vaginal discharge.  You have pain when you urinate.  You notice increased swelling in your face, hands, legs, or ankles.  You are exposed to fifth disease or chickenpox.  You are exposed to German measles (rubella) and have never had it. Get help right away if:  You have a fever.  You are leaking fluid from your vagina.  You have spotting or bleeding from your vagina.  You have severe abdominal cramping or pain.  You have rapid weight gain or loss.  You vomit blood or material that looks like coffee grounds.  You develop a severe headache.  You have shortness of breath.  You have any kind of trauma, such as from a fall or a car accident. Summary  The first trimester of pregnancy is from week 1 until the end of week 13 (months 1 through 3).  Your body goes through many changes during pregnancy. The changes vary from  woman to woman.  You will have routine prenatal visits. During those visits, your health care provider will examine you, discuss any test results you may have, and talk with you about how you are feeling. This information is not intended to replace advice given to you by your health care provider. Make sure you discuss any questions you have with your health care provider. Document Released: 05/28/2001 Document Revised: 05/15/2016 Document Reviewed: 05/15/2016 Elsevier Interactive Patient Education  2017 [employer]   Inc.  

## 2017-03-24 NOTE — MAU Note (Signed)
Pt presents with c/o "hurting" in lower abdomen & back.  Denies spotting or VB.

## 2017-03-24 NOTE — MAU Provider Note (Signed)
Chief Complaint: Abdominal Pain and Back Pain   SUBJECTIVE HPI: Maureen Perez is a 31 y.o. [id number] at [redacted]w[redacted]d who presents to MAU with complaints of back pain. Patient states that she worked full shift yesterday at her job. She did a lot of heavy lifting of boxes and pallets. She has chronic back pain feels like she may have exacerbated pain by the heavy lifting. She also started having lower abdominal cramping last night. Abdominal cramping comes and goes. Denies any vaginal bleeding, dysuria fevers. She took some Tylenol which helped with her symptoms some. Wants reassurance that everything is okay. Patient also mentions she has been having increased changes in her mood. Not currently on medication. Has a history of depression. Denies SI/HI.   Past Medical History:  Diagnosis Date  . Abnormal Pap smear    repeat WNL  . Back pain   . Cholelithiasis   . Depression    no meds since pregancny  . Family history of adverse reaction to anesthesia    " MOTHER TAKES ALONG TIME TO WAKE "  . Infection    UTI  . Injury of tendon of left rotator cuff   . Obesity   . PONV (postoperative nausea and vomiting)    OB History  Gravida Para Term Preterm AB Living  0 2 2  SAB TAB Ectopic Multiple Live Births  2 0 0 0 2    # Outcome Date GA Lbr Len/2nd Weight Sex Delivery Anes PTL Lv  5 Current           4 Term 08/09/13 [redacted]w[redacted]d 04:44 / 00:08 2.54 kg (5 lb 9.6 oz) F Vag-Spont EPI  LIV  3 Term 09/01/11 [redacted]w[redacted]d 21:43 / 01:13 2.805 kg (6 lb 2.9 oz) F Vag-Spont EPI  LIV     Birth Comments: No anomalies noted  2 SAB           1 SAB  [redacted]w[redacted]d            Past Surgical History:  Procedure Laterality Date  . CHOLECYSTECTOMY N/A 12/05/2014   Procedure: LAPAROSCOPIC CHOLECYSTECTOMY WITH INTRAOPERATIVE CHOLANGIOGRAM;  Surgeon: Chevis Pretty III, MD;  Location: [facility] OR;  Service: General;  Laterality: N/A;  . TONSILLECTOMY     age 12  . tubes in ears  age 69   Social History   Social History  . Marital status:  Single    Spouse name: N/A  . Number of children: N/A  . Years of education: N/A   Occupational History  . Not on file.   Social History Main Topics  . Smoking status: Current Every Day Smoker    Packs/day: 0.50    Years: 10.00    Types: Cigarettes  . Smokeless tobacco: Never Used  . Alcohol use No  . Drug use: No  . Sexual activity: Yes    Birth control/ protection: Condom   Other Topics Concern  . Not on file   Social History Narrative  . No narrative on file   No current facility-administered medications on file prior to encounter.    Current Outpatient Prescriptions on File Prior to Encounter  Medication Sig Dispense Refill  . acetaminophen (TYLENOL) 325 MG tablet Take 650 mg by mouth every 6 (six) hours as needed for moderate pain.     . Prenatal Multivit-Min-Fe-FA (PRENATAL VITAMINS PO) Take 1 tablet by mouth daily.     Allergies  Allergen Reactions  . Cephalexin Itching and Nausea And Vomiting  .  Percocet [Oxycodone-Acetaminophen] Nausea And Vomiting    I have reviewed the past Medical Hx, Surgical Hx, Social Hx, Allergies and Medications.   REVIEW OF SYSTEMS All systems reviewed and are negative for acute change except as noted in the HPI.   OBJECTIVE BP 120/66   Pulse 83   Temp 98 F (36.7 C)   Resp 18   Ht  (1.6 m)   Wt 68.9 kg (152 lb)   LMP 01/24/2017   BMI 26.93 kg/m    PHYSICAL EXAM Constitutional: Well-developed, well-nourished female in no acute distress.  Cardiovascular: normal rate and rhythm, pulses intact Respiratory: normal rate and effort.  GI: Abd soft, non-tender, non-distended. MS: Extremities nontender, no edema, normal ROM Neurologic: Alert and oriented x 4. No focal deficits GU: Neg CVAT. Psych: normal mood and affect.  LAB RESULTS Results for orders placed or performed during the hospital encounter of 03/24/17 (from the past 24 hour(s))  Urinalysis, Routine w reflex microscopic     Status: Abnormal   Collection  Time: 03/24/17  8:45 AM  Result Value Ref Range   Color, Urine COLORLESS (A) YELLOW   APPearance CLEAR CLEAR   Specific Gravity, Urine 1.015 1.005 - 1.030   pH 5.0 5.0 - 8.0   Glucose, UA NEGATIVE NEGATIVE mg/dL   Hgb urine dipstick NEGATIVE NEGATIVE   Bilirubin Urine NEGATIVE NEGATIVE   Ketones, ur NEGATIVE NEGATIVE mg/dL   Protein, ur NEGATIVE NEGATIVE mg/dL   Nitrite NEGATIVE NEGATIVE   Leukocytes, UA NEGATIVE NEGATIVE    IMAGING US Ob Transvaginal  Result Date: 03/06/2017 CLINICAL DATA:  Location, viability EXAM: TRANSVAGINAL OB ULTRASOUND TECHNIQUE: Transvaginal ultrasound was performed for complete evaluation of the gestation as well as the maternal uterus, adnexal regions, and pelvic cul-de-sac. COMPARISON:  02/20/2017 FINDINGS: Intrauterine gestational sac: Single Yolk sac:  Visualized Embryo:  Visualized Cardiac Activity: Visualized Heart Rate: 121 bpm MSD:   mm    w     d CRL:   5.1  mm   6 w 1 d                  [place] EDC: 10/29/2017 Subchorionic hemorrhage:  None visualized. Maternal uterus/adnexae: No adnexal mass.  Trace free fluid. IMPRESSION: 6 week 1 day intrauterine pregnancy. Fetal heart rate 121 beats per minute. No acute maternal findings. Electronically Signed   By: Charlett Nose [name]   On: 03/06/2017 08:56    MAU COURSE Vitals and nursing notes reviewed I have ordered labs and reviewed them UA unremarkable  MDM Plan of care reviewed with patient, including labs and tests ordered and medical treatment.   ASSESSMENT 1. Chronic midline low back pain without sciatica   2. Abdominal cramping     PLAN Discharge home in stable condition Counseled on return precautions Discussed with patient support band for back Work note given Handout given   Caryl Ada, DO [facility], [facility] 03/24/2017, 8:49 AM

## 2017-04-10 ENCOUNTER — Ambulatory Visit (INDEPENDENT_AMBULATORY_CARE_PROVIDER_SITE_OTHER): Payer: Medicaid Other | Admitting: Obstetrics & Gynecology

## 2017-04-10 ENCOUNTER — Encounter: Payer: Self-pay | Admitting: Obstetrics & Gynecology

## 2017-04-10 VITALS — BP 118/65 | HR 82 | Wt 147.0 lb

## 2017-04-10 DIAGNOSIS — Z3481 Encounter for supervision of other normal pregnancy, first trimester: Secondary | ICD-10-CM | POA: Diagnosis not present

## 2017-04-10 DIAGNOSIS — K029 Dental caries, unspecified: Secondary | ICD-10-CM | POA: Diagnosis not present

## 2017-04-10 DIAGNOSIS — O0993 Supervision of high risk pregnancy, unspecified, third trimester: Secondary | ICD-10-CM | POA: Insufficient documentation

## 2017-04-10 DIAGNOSIS — Z3480 Encounter for supervision of other normal pregnancy, unspecified trimester: Secondary | ICD-10-CM

## 2017-04-10 DIAGNOSIS — Z23 Encounter for immunization: Secondary | ICD-10-CM | POA: Diagnosis not present

## 2017-04-10 DIAGNOSIS — Z1379 Encounter for other screening for genetic and chromosomal anomalies: Secondary | ICD-10-CM

## 2017-04-10 MED ORDER — ONDANSETRON 4 MG PO TBDP: 4.0000 mg | ORAL_TABLET | Freq: Four times a day (QID) | ORAL | 0 refills | Status: DC | PRN

## 2017-04-10 NOTE — Progress Notes (Signed)
Pt stated having soft stool after eating for about 1 week.

## 2017-04-10 NOTE — Patient Instructions (Signed)
First Trimester of Pregnancy The first trimester of pregnancy is from week 1 until the end of week 13 (months 1 through 3). A week after a sperm fertilizes an egg, the egg will implant on the wall of the uterus. This embryo will begin to develop into a baby. Genes from you and your partner will form the baby. The female genes will determine whether the baby will be a boy or a girl. At 6-8 weeks, the eyes and face will be formed, and the heartbeat can be seen on ultrasound. At the end of 12 weeks, all the baby's organs will be formed. Now that you are pregnant, you will want to do everything you can to have a healthy baby. Two of the most important things are to get good prenatal care and to follow your health care provider's instructions. Prenatal care is all the medical care you receive before the baby's birth. This care will help prevent, find, and treat any problems during the pregnancy and childbirth. Body changes during your first trimester Your body goes through many changes during pregnancy. The changes vary from woman to woman.  You may gain or lose a couple of pounds at first.  You may feel sick to your stomach (nauseous) and you may throw up (vomit). If the vomiting is uncontrollable, call your health care provider.  You may tire easily.  You may develop headaches that can be relieved by medicines. All medicines should be approved by your health care provider.  You may urinate more often. Painful urination may mean you have a bladder infection.  You may develop heartburn as a result of your pregnancy.  You may develop constipation because certain hormones are causing the muscles that push stool through your intestines to slow down.  You may develop hemorrhoids or swollen veins (varicose veins).  Your breasts may begin to grow larger and become tender. Your nipples may stick out more, and the tissue that surrounds them (areola) may become darker.  Your gums may bleed and may be  sensitive to brushing and flossing.  Dark spots or blotches (chloasma, mask of pregnancy) may develop on your face. This will likely fade after the baby is born.  Your menstrual periods will stop.  You may have a loss of appetite.  You may develop cravings for certain kinds of food.  You may have changes in your emotions from day to day, such as being excited to be pregnant or being concerned that something may go wrong with the pregnancy and baby.  You may have more vivid and strange dreams.  You may have changes in your hair. These can include thickening of your hair, rapid growth, and changes in texture. Some women also have hair loss during or after pregnancy, or hair that feels dry or thin. Your hair will most likely return to normal after your baby is born.  What to expect at prenatal visits During a routine prenatal visit:  You will be weighed to make sure you and the baby are growing normally.  Your blood pressure will be taken.  Your abdomen will be measured to track your baby's growth.  The fetal heartbeat will be listened to between weeks 10 and 14 of your pregnancy.  Test results from any previous visits will be discussed.  Your health care provider may ask you:  How you are feeling.  If you are feeling the baby move.  If you have had any abnormal symptoms, such as leaking fluid, bleeding, severe headaches,   or abdominal cramping.  If you are using any tobacco products, including cigarettes, chewing tobacco, and electronic cigarettes.  If you have any questions.  Other tests that may be performed during your first trimester include:  Blood tests to find your blood type and to check for the presence of any previous infections. The tests will also be used to check for low iron levels (anemia) and protein on red blood cells (Rh antibodies). Depending on your risk factors, or if you previously had diabetes during pregnancy, you may have tests to check for high blood  sugar that affects pregnant women (gestational diabetes).  Urine tests to check for infections, diabetes, or protein in the urine.  An ultrasound to confirm the proper growth and development of the baby.  Fetal screens for spinal cord problems (spina bifida) and Down syndrome.  HIV (human immunodeficiency virus) testing. Routine prenatal testing includes screening for HIV, unless you choose not to have this test.  You may need other tests to make sure you and the baby are doing well.  Follow these instructions at home: Medicines  Follow your health care provider's instructions regarding medicine use. Specific medicines may be either safe or unsafe to take during pregnancy.  Take a prenatal vitamin that contains at least 600 micrograms (mcg) of folic acid.  If you develop constipation, try taking a stool softener if your health care provider approves. Eating and drinking  Eat a balanced diet that includes fresh fruits and vegetables, whole grains, good sources of protein such as meat, eggs, or tofu, and low-fat dairy. Your health care provider will help you determine the amount of weight gain that is right for you.  Avoid raw meat and uncooked cheese. These carry germs that can cause birth defects in the baby.  Eating four or five small meals rather than three large meals a day may help relieve nausea and vomiting. If you start to feel nauseous, eating a few soda crackers can be helpful. Drinking liquids between meals, instead of during meals, also seems to help ease nausea and vomiting.  Limit foods that are high in fat and processed sugars, such as fried and sweet foods.  To prevent constipation: ? Eat foods that are high in fiber, such as fresh fruits and vegetables, whole grains, and beans. ? Drink enough fluid to keep your urine clear or pale yellow. Activity  Exercise only as directed by your health care provider. Most women can continue their usual exercise routine during  pregnancy. Try to exercise for 30 minutes at least 5 days a week. Exercising will help you: ? Control your weight. ? Stay in shape. ? Be prepared for labor and delivery.  Experiencing pain or cramping in the lower abdomen or lower back is a good sign that you should stop exercising. Check with your health care provider before continuing with normal exercises.  Try to avoid standing for long periods of time. Move your legs often if you must stand in one place for a long time.  Avoid heavy lifting.  Wear low-heeled shoes and practice good posture.  You may continue to have sex unless your health care provider tells you not to. Relieving pain and discomfort  Wear a good support bra to relieve breast tenderness.  Take warm sitz baths to soothe any pain or discomfort caused by hemorrhoids. Use hemorrhoid cream if your health care provider approves.  Rest with your legs elevated if you have leg cramps or low back pain.  If you develop   varicose veins in your legs, wear support hose. Elevate your feet for 15 minutes, 3-4 times a day. Limit salt in your diet. Prenatal care  Schedule your prenatal visits by the twelfth week of pregnancy. They are usually scheduled monthly at first, then more often in the last 2 months before delivery.  Write down your questions. Take them to your prenatal visits.  Keep all your prenatal visits as told by your health care provider. This is important. Safety  Wear your seat belt at all times when driving.  Make a list of emergency phone numbers, including numbers for family, friends, the hospital, and police and fire departments. General instructions  Ask your health care provider for a referral to a local prenatal education class. Begin classes no later than the beginning of month 6 of your pregnancy.  Ask for help if you have counseling or nutritional needs during pregnancy. Your health care provider can offer advice or refer you to specialists for help  with various needs.  Do not use hot tubs, steam rooms, or saunas.  Do not douche or use tampons or scented sanitary pads.  Do not cross your legs for long periods of time.  Avoid cat litter boxes and soil used by cats. These carry germs that can cause birth defects in the baby and possibly loss of the fetus by miscarriage or stillbirth.  Avoid all smoking, herbs, alcohol, and medicines not prescribed by your health care provider. Chemicals in these products affect the formation and growth of the baby.  Do not use any products that contain nicotine or tobacco, such as cigarettes and e-cigarettes. If you need help quitting, ask your health care provider. You may receive counseling support and other resources to help you quit.  Schedule a dentist appointment. At home, brush your teeth with a soft toothbrush and be gentle when you floss. Contact a health care provider if:  You have dizziness.  You have mild pelvic cramps, pelvic pressure, or nagging pain in the abdominal area.  You have persistent nausea, vomiting, or diarrhea.  You have a bad smelling vaginal discharge.  You have pain when you urinate.  You notice increased swelling in your face, hands, legs, or ankles.  You are exposed to fifth disease or chickenpox.  You are exposed to German measles (rubella) and have never had it. Get help right away if:  You have a fever.  You are leaking fluid from your vagina.  You have spotting or bleeding from your vagina.  You have severe abdominal cramping or pain.  You have rapid weight gain or loss.  You vomit blood or material that looks like coffee grounds.  You develop a severe headache.  You have shortness of breath.  You have any kind of trauma, such as from a fall or a car accident. Summary  The first trimester of pregnancy is from week 1 until the end of week 13 (months 1 through 3).  Your body goes through many changes during pregnancy. The changes vary from  woman to woman.  You will have routine prenatal visits. During those visits, your health care provider will examine you, discuss any test results you may have, and talk with you about how you are feeling. This information is not intended to replace advice given to you by your health care provider. Make sure you discuss any questions you have with your health care provider. Document Released: 05/28/2001 Document Revised: 05/15/2016 Document Reviewed: 05/15/2016 Elsevier Interactive Patient Education  2017 Elsevier   Inc.  

## 2017-04-10 NOTE — Progress Notes (Signed)
Subjective:diarrhea after eating, decreased appetite    Maureen Perez is a [id number] 7452w1d being seen today for her first obstetrical visit.  Her obstetrical history is significant for smoker. Patient does intend to breast feed. Pregnancy history fully reviewed.  Patient reports diarrhea.  Vitals:   04/10/17 0818  BP: 118/65  Pulse: 82  Weight: 147 lb (66.7 kg)    HISTORY: OB History  Gravida Para Term Preterm AB Living  5 2 2  0 2 2  SAB TAB Ectopic Multiple Live Births  2 0 0 0 2    # Outcome Date GA Lbr Len/2nd Weight Sex Delivery Anes PTL Lv  5 Current           4 Term 08/09/13 7582w0d 04:44 / 00:08 5 lb 9.6 oz (2.54 kg) F Vag-Spont EPI  LIV  3 Term 09/01/11 8469w4d 21:43 / 01:13 6 lb 2.9 oz (2.805 kg) F Vag-Spont EPI  LIV     Birth Comments: No anomalies noted  2 SAB           1 SAB  468w0d            Past Medical History:  Diagnosis Date  . Abnormal Pap smear    repeat WNL  . Back pain   . Cholelithiasis   . Depression    no meds since pregancny  . Family history of adverse reaction to anesthesia    " MOTHER TAKES ALONG TIME TO WAKE "  . Infection    UTI  . Injury of tendon of left rotator cuff   . Obesity   . PONV (postoperative nausea and vomiting)    Past Surgical History:  Procedure Laterality Date  . CHOLECYSTECTOMY N/A 12/05/2014   Procedure: LAPAROSCOPIC CHOLECYSTECTOMY WITH INTRAOPERATIVE CHOLANGIOGRAM;  Surgeon: Chevis PrettyPaul Toth III, MD;  Location: [facility] OR;  Service: General;  Laterality: N/A;  . TONSILLECTOMY     age 31  . tubes in ears  age 89   Family History  Problem Relation Age of Onset  . Depression Mother   . Diabetes Mother   . Hypertension Mother   . COPD Mother   . Anesthesia problems Mother   . Depression Sister   . Diabetes Sister   . Kidney disease Sister   . Hypertension Sister   . Hypertension Father   . Anesthesia problems Father   . COPD Father      Exam    Uterus:     Pelvic Exam:    Perineum: No Hemorrhoids   Vulva:  normal   Vagina:  normal discharge   pH:     Cervix: no lesions   Adnexa: normal adnexa   Bony Pelvis: average  System: Breast:  normal appearance, no masses or tenderness   Skin: normal coloration and turgor, no rashes    Neurologic: oriented, normal mood   Extremities: normal strength, tone, and muscle mass   HEENT neck supple with midline trachea and thyroid without masses   Mouth/Teeth mucous membranes moist, pharynx normal without lesions, dental hygiene poor and severe dental caries   Neck supple   Cardiovascular: regular rate and rhythm, no murmurs or gallops   Respiratory:  appears well, vitals normal, no respiratory distress, acyanotic, normal RR, neck free of mass or lymphadenopathy, chest clear, no wheezing, crepitations, rhonchi, normal symmetric air entry   Abdomen: soft, non-tender; bowel sounds normal; no masses,  no organomegaly   Urinary: urethral meatus normal      Assessment:    Pregnancy:  [id number] Patient Active Problem List   Diagnosis Date Noted  . Supervision of other normal pregnancy, antepartum 04/10/2017  . Acute cholecystitis 12/05/2014  . GERD (gastroesophageal reflux disease) 06/15/2013  . Smoking complicating pregnancy, antepartum 01/12/2013  . DEPRESSION, MODERATE, RECURRENT 03/20/2010  . 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 INSOMNIA 01/06/2009  . TOBACCO ABUSE 11/22/2008  . ALLERGIC RHINITIS DUE TO ANIMAL HAIR AND DANDER 11/22/2008  . OBESITY 10/20/2008        Plan:     Initial labs drawn. Prenatal vitamins. Problem list reviewed and updated. Genetic Screening discussed First Screen: ordered.  Ultrasound discussed; fetal survey: 18+ weeks.  Follow up in 4 weeks. 50% of 30 min visit spent on counseling and coordination of care.  Urged smoking cessation Will try Zofran with meals for her GI sx   Scheryl Darter 04/10/2017

## 2017-04-10 NOTE — Progress Notes (Signed)
Maureen Perez is a 31 y.o. L2G4010G5P2022 at 2937w1d gestation determined by LMP who presents to clinic for prenatal care. Patient reports no contractions, no leakage of fluid, no vaginal bleeding, no fetal movement. Patient reports some diarrhea for the past week but no nausea, vomiting, fever, or fatigue. Her menstrual periods are regular and typically last 4 days. She has a 28 day cycle. Reporting of LMP is accurate. Patient's BP was 118/65 with no prior history of HTN.    PMH: Patient reports that she was diagnosed with an enlarged heart.   PSH: gallbladder surgery, tonsillectomy, bilateral tympanostomy   FH:  Sister: Gestational DM  Mother: heart problems Maternal Grandmother: heart problems Dad: Stroke, HTN Paternal Grandmother: Cervical Cancer Paternal Grandfather: Cirrhosis    OB History- This is the Patient's 5th pregnancy (2 miscarriages and 2 children.) Both births were full term and vaginally delivered at 38 weeks. No history of c-sections.She said reports placental problems during her first pregnancy. Her water broke for her first pregnancy but labor was induced during second pregnancy. Patient had an ovarian cyst that resolved on its own. Patient's last pap smear was last year; results were normal. No history of STIs, fibroids, or any gyn procedures  Discussed with patient first-trimester screening. Patient said she was interested as long as appointment was made in the morning.She is currently smoking during pregnancy but has cut down to one pack a day. Patient intends to breastfeed but has bottle fed during her previous pregnancies.  Assessment & Plan:  1. Diarrhea- Patient has no other constitutional signs. No nausea, vomiting, fever, or fatigue. Likely viral in etiology and self-limiting. Will prescribe Zofran and see if her symptoms improve during next f/u in four weeks. 2. Pregnancy- Initial labs have been drawn. Patient is currently taking prenatal vitamins. Patient is  interested in genetic screen. Order has been entered. Patient was encouraged to quit smoking.

## 2017-04-12 ENCOUNTER — Emergency Department (HOSPITAL_COMMUNITY)
Admission: EM | Admit: 2017-04-12 | Discharge: 2017-04-12 | Disposition: A | Discharge status: Home / Self Care | Payer: Medicaid Other | Attending: Emergency Medicine | Admitting: Emergency Medicine

## 2017-04-12 ENCOUNTER — Encounter (HOSPITAL_COMMUNITY): Payer: Self-pay | Admitting: Emergency Medicine

## 2017-04-12 DIAGNOSIS — Z5321 Procedure and treatment not carried out due to patient leaving prior to being seen by health care provider: Secondary | ICD-10-CM | POA: Insufficient documentation

## 2017-04-12 DIAGNOSIS — O219 Vomiting of pregnancy, unspecified: Secondary | ICD-10-CM | POA: Insufficient documentation

## 2017-04-12 LAB — CBC
HEMATOCRIT: 36.5 % (ref 36.0–46.0)
Hemoglobin: 12.1 g/dL (ref 12.0–15.0)
MCH: 29.7 pg (ref 26.0–34.0)
MCHC: 33.2 g/dL (ref 30.0–36.0)
MCV: 89.7 fL (ref 78.0–100.0)
Platelets: 226 10*3/uL (ref 150–400)
RBC: 4.07 MIL/uL (ref 3.87–5.11)
RDW: 13.5 % (ref 11.5–15.5)
WBC: 11.4 10*3/uL — AB (ref 4.0–10.5)

## 2017-04-12 LAB — COMPREHENSIVE METABOLIC PANEL
ALT: 10 U/L — ABNORMAL LOW (ref 14–54)
AST: 19 U/L (ref 15–41)
Albumin: 3.6 g/dL (ref 3.5–5.0)
Alkaline Phosphatase: 54 U/L (ref 38–126)
Anion gap: 10 (ref 5–15)
CHLORIDE: 103 mmol/L (ref 101–111)
CO2: 23 mmol/L (ref 22–32)
Calcium: 9 mg/dL (ref 8.9–10.3)
Creatinine, Ser: 0.62 mg/dL (ref 0.44–1.00)
Glucose, Bld: 98 mg/dL (ref 65–99)
POTASSIUM: 3.5 mmol/L (ref 3.5–5.1)
SODIUM: 136 mmol/L (ref 135–145)
Total Bilirubin: 0.5 mg/dL (ref 0.3–1.2)
Total Protein: 6.2 g/dL — ABNORMAL LOW (ref 6.5–8.1)

## 2017-04-12 LAB — CULTURE, OB URINE

## 2017-04-12 LAB — URINE CULTURE, OB REFLEX

## 2017-04-12 LAB — LIPASE, BLOOD: LIPASE: 17 U/L (ref 11–51)

## 2017-04-12 NOTE — ED Notes (Signed)
No answer when called 

## 2017-04-12 NOTE — ED Triage Notes (Signed)
Pt c/o abdominal pain and vomiting onset yesterday. Pt is currently pregnant, reports had flu shot on Thursday.

## 2017-04-14 LAB — OBSTETRIC PANEL, INCLUDING HIV
Antibody Screen: NEGATIVE
BASOS ABS: 0 10*3/uL (ref 0.0–0.2)
Basos: 0 %
EOS (ABSOLUTE): 0.2 10*3/uL (ref 0.0–0.4)
Eos: 2 %
HEP B S AG: NEGATIVE
HIV SCREEN 4TH GENERATION: NONREACTIVE
Hematocrit: 37.4 % (ref 34.0–46.6)
Hemoglobin: 13 g/dL (ref 11.1–15.9)
IMMATURE GRANULOCYTES: 0 %
Immature Grans (Abs): 0 10*3/uL (ref 0.0–0.1)
Lymphocytes Absolute: 2 10*3/uL (ref 0.7–3.1)
Lymphs: 20 %
MCH: 29.7 pg (ref 26.6–33.0)
MCHC: 34.8 g/dL (ref 31.5–35.7)
MCV: 86 fL (ref 79–97)
MONOCYTES: 5 %
Monocytes Absolute: 0.5 10*3/uL (ref 0.1–0.9)
NEUTROS PCT: 73 %
Neutrophils Absolute: 7.3 10*3/uL — ABNORMAL HIGH (ref 1.4–7.0)
PLATELETS: 246 10*3/uL (ref 150–379)
RBC: 4.37 x10E6/uL (ref 3.77–5.28)
RDW: 13.9 % (ref 12.3–15.4)
RPR: NONREACTIVE
Rh Factor: POSITIVE
WBC: 10 10*3/uL (ref 3.4–10.8)

## 2017-04-14 LAB — HEMOGLOBINOPATHY EVALUATION
FERRITIN: 132 ng/mL (ref 15–150)
HGB C: 0 %
HGB F QUANT: 1.2 % (ref 0.0–2.0)
HGB S: 0 %
HGB SOLUBILITY: NEGATIVE
Hgb A2 Quant: 3.2 % (ref 1.8–3.2)
Hgb A: 95.6 % — ABNORMAL LOW (ref 96.4–98.8)
Hgb Variant: 0 %

## 2017-04-16 ENCOUNTER — Encounter (HOSPITAL_COMMUNITY): Payer: Self-pay | Admitting: Obstetrics & Gynecology

## 2017-04-18 LAB — CYSTIC FIBROSIS MUTATION 97: Interpretation: NOT DETECTED

## 2017-04-25 ENCOUNTER — Ambulatory Visit (HOSPITAL_COMMUNITY)
Admission: RE | Admit: 2017-04-25 | Discharge: 2017-04-25 | Disposition: A | Discharge status: Home / Self Care | Payer: Medicaid Other | Source: Ambulatory Visit | Attending: Obstetrics & Gynecology | Admitting: Obstetrics & Gynecology

## 2017-04-25 ENCOUNTER — Encounter (HOSPITAL_COMMUNITY): Payer: Self-pay

## 2017-04-25 ENCOUNTER — Other Ambulatory Visit: Payer: Self-pay | Admitting: Obstetrics & Gynecology

## 2017-04-25 DIAGNOSIS — O99331 Smoking (tobacco) complicating pregnancy, first trimester: Secondary | ICD-10-CM

## 2017-04-25 DIAGNOSIS — Z1379 Encounter for other screening for genetic and chromosomal anomalies: Secondary | ICD-10-CM

## 2017-04-25 DIAGNOSIS — Z3A13 13 weeks gestation of pregnancy: Secondary | ICD-10-CM

## 2017-04-25 DIAGNOSIS — O09291 Supervision of pregnancy with other poor reproductive or obstetric history, first trimester: Secondary | ICD-10-CM | POA: Diagnosis present

## 2017-04-25 DIAGNOSIS — Z3682 Encounter for antenatal screening for nuchal translucency: Secondary | ICD-10-CM | POA: Diagnosis not present

## 2017-05-06 ENCOUNTER — Other Ambulatory Visit (HOSPITAL_COMMUNITY): Payer: Self-pay

## 2017-05-08 ENCOUNTER — Encounter (HOSPITAL_COMMUNITY): Payer: Self-pay

## 2017-05-08 ENCOUNTER — Other Ambulatory Visit: Payer: Self-pay

## 2017-05-08 ENCOUNTER — Emergency Department (HOSPITAL_COMMUNITY)
Admission: EM | Admit: 2017-05-08 | Discharge: 2017-05-08 | Disposition: A | Discharge status: Home / Self Care | Payer: Medicaid Other | Attending: Emergency Medicine | Admitting: Emergency Medicine

## 2017-05-08 DIAGNOSIS — H9202 Otalgia, left ear: Secondary | ICD-10-CM | POA: Diagnosis not present

## 2017-05-08 DIAGNOSIS — M7989 Other specified soft tissue disorders: Secondary | ICD-10-CM

## 2017-05-08 DIAGNOSIS — F17210 Nicotine dependence, cigarettes, uncomplicated: Secondary | ICD-10-CM | POA: Insufficient documentation

## 2017-05-08 DIAGNOSIS — R2241 Localized swelling, mass and lump, right lower limb: Secondary | ICD-10-CM | POA: Diagnosis not present

## 2017-05-08 MED ORDER — NEOMYCIN-POLYMYXIN-HC 3.5-10000-1 OT SUSP: 4.0000 [drp] | Freq: Three times a day (TID) | OTIC | Status: DC

## 2017-05-08 NOTE — ED Notes (Signed)
Pt departed in NAD, refused use of wheelchair.  

## 2017-05-08 NOTE — ED Notes (Signed)
Pt states she's been experiencing swelling to her R hand & R foot for the past 3 days, in the mornings only. Swelling usually subsided by noon. No swelling noted upon presentation here.

## 2017-05-08 NOTE — ED Triage Notes (Signed)
Patient arrived POV; pt c/o left ear pain x 1 week and new onset of right foot swelling x 3 days ago; patient is [redacted] weeks pregnant and denies any sx regarding pregnancy; patient states pain at 7/10 on arrival. [facility]

## 2017-05-08 NOTE — Discharge Instructions (Signed)
You were seen here today for ear pain and swelling of right lower extremity in the mornings.  I am prescribing you ear drops for this. This is likely due to irritation from using q-tips. Please do not insert anything into your ear that is smaller than your elbow.  This includes QTips, keys, hair pins, etc.   In regards to your lower extremity swelling. I have ordered for you to have a Ultrasound to rule out a blood clot tomorrow.   IMPORTANT PATIENT INSTRUCTIONS:  You have been scheduled for an Out[facility].    If tomorrow is a Saturday, Sunday or holiday, please go to the [facility] Registration Desk at 8 am tomorrow morning and tell them you are there for a vascular study.  If tomorrow is a weekday (Monday-Friday), please [facility] at 8 am and tell them  you are there for a vascular study.  Please follow up with your OBGYN about todays visit. If you develop worsening or new concerning symptoms you can return to the emergency department for re-evaluation.

## 2017-05-08 NOTE — ED Provider Notes (Signed)
[facility] Provider Note   CSN: [id number] Arrival date & time: 05/08/17  1959     History   Chief Complaint Chief Complaint  Patient presents with  . Otalgia  . Foot Pain    HPI Maureen Perez is a 31 y.o. female who is [redacted] weeks pregnant presents the emergency department today for left ear pain and foot swelling.  Patient states that over the last 1 week she has had dull, achy pain in the left ear canal.  This initially onset after the patient was vigorously using Q-tips and she felt irritation in this area.  She says it is increased whenever she takes a shower and water gets in her ear.  She has noticed no drainage, no changes in hearing, tinnitus, vertigo.  Patient notes that over the last 3 days she has had right foot swelling upon awakening.  This only occurs in the morning and usually subsides by noon.  There is no associated pain, tightness, numbness, tingling.  No overlying erythema or heat of the joint. She denies fever, chills, chest pain, shortness of breath, IVDU, travel, immobilization, or previous blood clot.   HPI  Past Medical History:  Diagnosis Date  . Abnormal Pap smear    repeat WNL  . Back pain   . Cholelithiasis   . Depression    no meds since pregancny  . Family history of adverse reaction to anesthesia    " MOTHER TAKES ALONG TIME TO WAKE "  . Infection    UTI  . Injury of tendon of left rotator cuff   . Obesity   . PONV (postoperative nausea and vomiting)     Patient Active Problem List   Diagnosis Date Noted  . Supervision of other normal pregnancy, antepartum 04/10/2017  . Rampant dental caries 04/10/2017  . Acute cholecystitis 12/05/2014  . GERD (gastroesophageal reflux disease) 06/15/2013  . Smoking complicating pregnancy, antepartum 01/12/2013  . DEPRESSION, MODERATE, RECURRENT 03/20/2010  . 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 INSOMNIA 01/06/2009  . TOBACCO ABUSE 11/22/2008  .  ALLERGIC RHINITIS DUE TO ANIMAL HAIR AND DANDER 11/22/2008  . OBESITY 10/20/2008    Past Surgical History:  Procedure Laterality Date  . CHOLECYSTECTOMY N/A 12/05/2014   Procedure: LAPAROSCOPIC CHOLECYSTECTOMY WITH INTRAOPERATIVE CHOLANGIOGRAM;  Surgeon: Chevis PrettyPaul Toth III, MD;  Location: [facility] OR;  Service: General;  Laterality: N/A;  . TONSILLECTOMY     age [age]  . tubes in ears  age 89    OB History    Gravida Para Term Preterm AB Living   5 2 2  0 2 2   SAB TAB Ectopic Multiple Live Births   2 0 0 0 2       Home Medications    Prior to Admission medications   Medication Sig Start Date End Date Taking? Authorizing Provider  acetaminophen (TYLENOL) 325 MG tablet Take 650 mg by mouth every 6 (six) hours as needed for moderate pain.    Yes [provider]  Prenatal Multivit-Min-Fe-FA (PRENATAL VITAMINS PO) Take 1 tablet by mouth daily.   Yes [provider]    Family History Family History  Problem Relation Age of Onset  . Depression Mother   . Diabetes Mother   . Hypertension Mother   . COPD Mother   . Anesthesia problems Mother   . Depression Sister   . Diabetes Sister   . Kidney disease Sister   . Hypertension Sister   . Hypertension Father   .  Anesthesia problems Father   . COPD Father     Social History Social History   Tobacco Use  . Smoking status: Current Every Day Smoker    Packs/day: 0.50    Years: 10.00    Pack years: 5.00    Types: Cigarettes  . Smokeless tobacco: Never Used  Substance Use Topics  . Alcohol use: No    Alcohol/week: 0.0 oz  . Drug use: No     Allergies   Cephalexin and Percocet [oxycodone-acetaminophen]   Review of Systems Review of Systems  Constitutional: Negative for fever.  HENT: Positive for ear pain. Negative for tinnitus.   Respiratory: Negative for cough and shortness of breath.   Cardiovascular: Negative for chest pain.  Musculoskeletal: Positive for joint swelling. Negative for arthralgias.  Skin:  Negative for color change.  Neurological: Negative for weakness, numbness and headaches.  All other systems reviewed and are negative.    Physical Exam Updated Vital Signs BP 118/67 (BP Location: Left Arm)   Pulse 71   Temp 97.9 F (36.6 C) (Oral)   Resp 18   LMP 01/22/2017 (Exact Date)   SpO2 100%   Physical Exam  Constitutional: She appears well-developed and well-nourished.  HENT:  Head: Normocephalic and atraumatic.  Right Ear: Hearing, external ear and ear canal normal. No drainage or swelling. No mastoid tenderness. Tympanic membrane is not injected, not perforated, not erythematous, not retracted and not bulging. No middle ear effusion.  Left Ear: Hearing, tympanic membrane and external ear normal. No drainage or swelling. No mastoid tenderness. Tympanic membrane is not injected, not perforated, not erythematous, not retracted and not bulging.  No middle ear effusion.  Nose: Nose normal.  Mouth/Throat: Uvula is midline, oropharynx is clear and moist and mucous membranes are normal. No tonsillar exudate.  Left ear canal erythematous. No edema or discharge.  Tympanosclerosis bilaterally.  Eyes: Pupils are equal, round, and reactive to light. Right eye exhibits no discharge. Left eye exhibits no discharge. No scleral icterus.  Neck: Trachea normal and normal range of motion. Neck supple. No spinous process tenderness present. No neck rigidity. Normal range of motion present.  No meningismus  Cardiovascular: Normal rate, regular rhythm and intact distal pulses.  No murmur heard. Pulses:      Radial pulses are 2+ on the right side, and 2+ on the left side.       Dorsalis pedis pulses are 2+ on the right side, and 2+ on the left side.       Posterior tibial pulses are 2+ on the right side, and 2+ on the left side.  No lower extremity swelling or edema. Calves symmetric in size bilaterally.  No tenderness palpation of the lower extremity.  Pulmonary/Chest: Effort normal and breath  sounds normal. She exhibits no tenderness.  Abdominal: Soft. Bowel sounds are normal. There is no tenderness. There is no rebound and no guarding.  Musculoskeletal: She exhibits no edema.       Right knee: Normal.       Right ankle: Normal. Achilles tendon normal.       Right lower leg: Normal.  Lymphadenopathy:    She has no cervical adenopathy.  Neurological: She is alert. She has normal strength. No sensory deficit.  Speech clear. Follows commands. No facial droop. PERRLA. EOM grossly intact. CN III-XII grossly intact. Grossly moves all extremities 4 without ataxia. Able and appropriate strength for age to upper and lower extremities bilaterally including grip strength and LE strength.   Skin:  Skin is warm and dry. Capillary refill takes less than 2 seconds. No rash noted. She is not diaphoretic. No erythema.  Psychiatric: She has a normal mood and affect.  Nursing note and vitals reviewed.   ED Treatments / Results  Labs (all labs ordered are listed, but only abnormal results are displayed) Labs Reviewed - No data to display  EKG  EKG Interpretation None       Radiology No results found.  Procedures Procedures (including critical care time)  Medications Ordered in ED Medications - No data to display   Initial Impression / Assessment and Plan / ED Course  I have reviewed the triage vital signs and the nursing notes.  Pertinent labs & imaging results that were available during my care of the patient were reviewed by me and considered in my medical decision making (see chart for details).     31 year old pregnant female presenting with left ear pain and right lower extremity swelling in the mornings.  Patient has ear pain occurred after vigorously using Q-tip.  There was no blood on the Q-tip.  Ear canal is very erythematous.  There is no mastoid erythema/tenderness.  No meningismus. No evidence of AOM or TM perf. Will treat her with Cortisporin drops.  Advised the  patient not to put anything smaller than her elbow and her ear.  She is to follow with her OB/GYN about today's visit.  Patient with right lower leg swelling in the mornings upon awakening that resolves by noon.  She is symptom free here currently.  There is no evidence of overlying infection.  No bony or muscular tenderness on palpation.  Patient has full range of motion of lower leg without pain.  She is NVI. Will schedule the patient to have follow-up in his ultrasound in the morning to rule out DVT.   Patient and her husband made aware plan and they are in agreement.  Patient given instructions on how to obtain DVT study.  Future images put in the system at time of discharge. Patient appears safe for discharge.   Patient case discussed with Dr. Madilyn Hook who is in agreement with plan.  Final Clinical Impressions(s) / ED Diagnoses   Final diagnoses:  Left ear pain  Swelling of lower extremity    ED Discharge Orders        Ordered    LE VENOUS     05/08/17 2213    neomycin-polymyxin-hydrocortisone (CORTISPORIN) 3.5-10000-1 OTIC suspension  3 times daily     05/08/17 2213       Princella Pellegrini 05/09/17 0015    Tilden Fossa, MD 05/09/17 [phone]

## 2017-05-09 ENCOUNTER — Ambulatory Visit (HOSPITAL_COMMUNITY)
Admission: RE | Admit: 2017-05-09 | Discharge: 2017-05-09 | Disposition: A | Discharge status: Home / Self Care | Payer: Medicaid Other | Source: Ambulatory Visit | Attending: Physician Assistant | Admitting: Physician Assistant

## 2017-05-09 DIAGNOSIS — M7989 Other specified soft tissue disorders: Secondary | ICD-10-CM | POA: Insufficient documentation

## 2017-05-09 NOTE — Progress Notes (Signed)
RLE venous duplex prelim: negative for DVT.  Yoana Staib Eunice, RDMS, RVT  

## 2017-05-19 ENCOUNTER — Encounter: Payer: Self-pay | Admitting: Family Medicine

## 2017-05-19 ENCOUNTER — Other Ambulatory Visit: Payer: Self-pay

## 2017-05-19 ENCOUNTER — Ambulatory Visit (INDEPENDENT_AMBULATORY_CARE_PROVIDER_SITE_OTHER): Payer: Medicaid Other | Admitting: Family Medicine

## 2017-05-19 VITALS — BP 125/66 | HR 78 | Wt 151.8 lb

## 2017-05-19 DIAGNOSIS — R079 Chest pain, unspecified: Secondary | ICD-10-CM

## 2017-05-19 DIAGNOSIS — Z3482 Encounter for supervision of other normal pregnancy, second trimester: Secondary | ICD-10-CM

## 2017-05-19 DIAGNOSIS — Z3480 Encounter for supervision of other normal pregnancy, unspecified trimester: Secondary | ICD-10-CM

## 2017-05-19 NOTE — Patient Instructions (Signed)
Chest Wall Pain °Chest wall pain is pain in or around the bones and muscles of your chest. Sometimes, an injury causes this pain. Sometimes, the cause may not be known. This pain may take several weeks or longer to get better. °Follow these instructions at home: °Pay attention to any changes in your symptoms. Take these actions to help with your pain: °· Rest as told by your doctor. °· Avoid activities that cause pain. Try not to use your chest, belly (abdominal), or side muscles to lift heavy things. °· If directed, apply ice to the painful area: °? Put ice in a plastic bag. °? Place a towel between your skin and the bag. °? Leave the ice on for 20 minutes, 2-3 times per day. °· Take over-the-counter and prescription medicines only as told by your doctor. °· Do not use tobacco products, including cigarettes, chewing tobacco, and e-cigarettes. If you need help quitting, ask your doctor. °· Keep all follow-up visits as told by your doctor. This is important. ° °Contact a doctor if: °· You have a fever. °· Your chest pain gets worse. °· You have new symptoms. °Get help right away if: °· You feel sick to your stomach (nauseous) or you throw up (vomit). °· You feel sweaty or light-headed. °· You have a cough with phlegm (sputum) or you cough up blood. °· You are short of breath. °This information is not intended to replace advice given to you by your health care provider. Make sure you discuss any questions you have with your health care provider. °Document Released: 11/20/2007 Document Revised: 11/09/2015 Document Reviewed: 08/29/2014 °Elsevier Interactive Patient Education © 2018 [employer] ° °

## 2017-05-19 NOTE — Progress Notes (Signed)
Pt reports intermittent chest pain that started yesterday

## 2017-05-19 NOTE — Progress Notes (Signed)
   PRENATAL VISIT NOTE  Subjective:  Maureen Perez is a 31 y.o. [id number] at 5920w5d being seen today for ongoing prenatal care.  She is currently monitored for the following issues for this low-risk pregnancy and has OBESITY; DEPRESSION, MODERATE, RECURRENT; TOBACCO ABUSE; TINNITUS, CHRONIC, BILATERAL; ALLERGIC RHINITIS DUE TO ANIMAL HAIR AND DANDER; INSOMNIA; Smoking complicating pregnancy, antepartum; GERD (gastroesophageal reflux disease); Acute cholecystitis; Supervision of other normal pregnancy, antepartum; and Rampant dental caries on their problem list.  Patient reports chest pain that is like previous chest pain in 2015. patient saw cardiology for this in 2015 following birth or last child and said it was benign condition..  Contractions: Not present. Vag. Bleeding: None.  Movement: Present. Denies leaking of fluid.   The following portions of the patient's history were reviewed and updated as appropriate: allergies, current medications, past family history, past medical history, past social history, past surgical history and problem list. Problem list updated.  Objective:   Vitals:   05/19/17 1845  BP: 125/66  Pulse: 78  Weight: 151 lb 12.8 oz (68.9 kg)    Fetal Status: Fetal Heart Rate (bpm): 144   Movement: Present     General:  Alert, oriented and cooperative. Patient is in no acute distress.  Skin: Skin is warm and dry. No rash noted.   Cardiovascular: Normal heart rate noted  Respiratory: Normal respiratory effort, no problems with respiration noted  Abdomen: Soft, gravid, appropriate for gestational age.  Pain/Pressure: Absent     Pelvic: Cervical exam deferred        Extremities: Normal range of motion.  Edema: Trace  Mental Status:  Normal mood and affect. Normal behavior. Normal judgment and thought content.   MSK: tender to palpation along anterior left chest, ribs 2-4 Assessment and Plan:  Pregnancy: [id number] at 3420w5d  1. Supervision of other normal pregnancy,  antepartum  - US MFM OB DETAIL +14 WK; Future - AFP, Serum, Open Spina Bifida  2. Chest pain, unspecified type -cardiology note looks like chest pain was chest wall discomfort. I think it is the same today, very tender along left anterior chest wall. Advised tylenol as needed for pain.  - EKG; normal sinus rhythm with rate of 78 bpm  Given general obstetric precautions including but not limited to vaginal bleeding, contractions, leaking of fluid and fetal movement were reviewed in detail with the patient. Please refer to After Visit Summary for other counseling recommendations.  Return in about 4 weeks (around 06/16/2017).   Rolm BookbinderAmber Antrell Tipler, DO

## 2017-05-22 ENCOUNTER — Ambulatory Visit: Payer: Medicaid Other

## 2017-05-22 ENCOUNTER — Encounter: Payer: Medicaid Other | Admitting: Obstetrics & Gynecology

## 2017-05-22 ENCOUNTER — Telehealth: Payer: Self-pay

## 2017-05-22 DIAGNOSIS — Z3480 Encounter for supervision of other normal pregnancy, unspecified trimester: Secondary | ICD-10-CM

## 2017-05-22 LAB — AFP, SERUM, OPEN SPINA BIFIDA
AFP MOM: 1.64
AFP Value: 57.6 ng/mL
Gest. Age on Collection Date: 16.7 weeks
MATERNAL AGE AT EDD: 29.6 a
OSBR RISK 1 IN: 1895
Test Results:: NEGATIVE
Weight: 151 [lb_av]

## 2017-05-22 NOTE — Progress Notes (Signed)
Received called from patient reporting she was at work today and smell some paint while working and immediately throw up. She is concern about the baby and wanted to know if we could check baby heart rate. Patient scheduled for appointment for today for heart rate check.  Heart  Rated was normal 145. Patient left the office very happy. Patient will follow up for next appointment.

## 2017-05-22 NOTE — Telephone Encounter (Signed)
Notified pt that her Anatomy US has been scheduled for December 19th @ 1400.  Pt stated understanding.

## 2017-05-30 ENCOUNTER — Encounter (HOSPITAL_COMMUNITY): Payer: Self-pay

## 2017-05-30 ENCOUNTER — Inpatient Hospital Stay (HOSPITAL_COMMUNITY)
Admission: AD | Admit: 2017-05-30 | Discharge: 2017-05-30 | Disposition: A | Discharge status: Home / Self Care | Payer: Medicaid Other | Source: Ambulatory Visit | Attending: Family Medicine | Admitting: Family Medicine

## 2017-05-30 DIAGNOSIS — F17210 Nicotine dependence, cigarettes, uncomplicated: Secondary | ICD-10-CM | POA: Insufficient documentation

## 2017-05-30 DIAGNOSIS — O9989 Other specified diseases and conditions complicating pregnancy, childbirth and the puerperium: Secondary | ICD-10-CM | POA: Insufficient documentation

## 2017-05-30 DIAGNOSIS — O99342 Other mental disorders complicating pregnancy, second trimester: Secondary | ICD-10-CM | POA: Insufficient documentation

## 2017-05-30 DIAGNOSIS — Z3A18 18 weeks gestation of pregnancy: Secondary | ICD-10-CM | POA: Insufficient documentation

## 2017-05-30 DIAGNOSIS — Z79899 Other long term (current) drug therapy: Secondary | ICD-10-CM | POA: Diagnosis not present

## 2017-05-30 DIAGNOSIS — F329 Major depressive disorder, single episode, unspecified: Secondary | ICD-10-CM | POA: Insufficient documentation

## 2017-05-30 DIAGNOSIS — O99332 Smoking (tobacco) complicating pregnancy, second trimester: Secondary | ICD-10-CM | POA: Diagnosis not present

## 2017-05-30 DIAGNOSIS — R112 Nausea with vomiting, unspecified: Secondary | ICD-10-CM

## 2017-05-30 DIAGNOSIS — O219 Vomiting of pregnancy, unspecified: Secondary | ICD-10-CM | POA: Diagnosis present

## 2017-05-30 DIAGNOSIS — O99212 Obesity complicating pregnancy, second trimester: Secondary | ICD-10-CM | POA: Diagnosis not present

## 2017-05-30 DIAGNOSIS — R197 Diarrhea, unspecified: Secondary | ICD-10-CM | POA: Diagnosis not present

## 2017-05-30 LAB — URINALYSIS, ROUTINE W REFLEX MICROSCOPIC
Bilirubin Urine: NEGATIVE
GLUCOSE, UA: NEGATIVE mg/dL
Ketones, ur: NEGATIVE mg/dL
Nitrite: NEGATIVE
Protein, ur: NEGATIVE mg/dL
SPECIFIC GRAVITY, URINE: 1.011 (ref 1.005–1.030)
pH: 7 (ref 5.0–8.0)

## 2017-05-30 NOTE — MAU Note (Signed)
Pt is a G5P2 at 18.2 week c/o N&V and diarrhea in the past 24 hours.

## 2017-05-30 NOTE — MAU Provider Note (Signed)
History     CSN: [id number]  Arrival date and time: 05/30/17 1654   First Provider Initiated Contact with Patient 05/30/17 1900      Chief Complaint  Patient presents with  . Emesis  . Diarrhea   HPI Maureen Perez 31 y.o. 6340w2d Comes to MAU today with vomiting a couple of time this morning and diarrhea stools - last one at 2:30 pm today.  Has been sipping on water this afternoon and has not had any further vomiting.  Does not feel dehydrated.  Was worried about whether to go to work tomorrow.  OB History    Gravida Para Term Preterm AB Living   5 2 2  0 2 2   SAB TAB Ectopic Multiple Live Births   2 0 0 0 2      Past Medical History:  Diagnosis Date  . Abnormal Pap smear    repeat WNL  . Back pain   . Cholelithiasis   . Depression    no meds since pregancny  . Family history of adverse reaction to anesthesia    " MOTHER TAKES ALONG TIME TO WAKE "  . Infection    UTI  . Injury of tendon of left rotator cuff   . Obesity   . PONV (postoperative nausea and vomiting)     Past Surgical History:  Procedure Laterality Date  . CHOLECYSTECTOMY N/A 12/05/2014   Procedure: LAPAROSCOPIC CHOLECYSTECTOMY WITH INTRAOPERATIVE CHOLANGIOGRAM;  Surgeon: Chevis PrettyPaul Toth III, MD;  Location: [facility] OR;  Service: General;  Laterality: N/A;  . TONSILLECTOMY     age 31  . tubes in ears  age 89    Family History  Problem Relation Age of Onset  . Depression Mother   . Diabetes Mother   . Hypertension Mother   . COPD Mother   . Anesthesia problems Mother   . Depression Sister   . Diabetes Sister   . Kidney disease Sister   . Hypertension Sister   . Hypertension Father   . Anesthesia problems Father   . COPD Father     Social History   Tobacco Use  . Smoking status: Current Every Day Smoker    Packs/day: 0.50    Years: 10.00    Pack years: 5.00    Types: Cigarettes  . Smokeless tobacco: Never Used  Substance Use Topics  . Alcohol use: No    Alcohol/week: 0.0 oz  . Drug use:  No    Allergies:  Allergies  Allergen Reactions  . Cephalexin Itching and Nausea And Vomiting  . Percocet [Oxycodone-Acetaminophen] Nausea And Vomiting    Medications Prior to Admission  Medication Sig Dispense Refill Last Dose  . acetaminophen (TYLENOL) 325 MG tablet Take 650 mg by mouth every 6 (six) hours as needed for moderate pain.    Taking  . neomycin-polymyxin-hydrocortisone (CORTISPORIN) 3.5-10000-1 OTIC suspension Place 4 drops into the left ear 3 (three) times daily. 10 mL ml Taking  . Prenatal Multivit-Min-Fe-FA (PRENATAL VITAMINS PO) Take 1 tablet by mouth daily.   Taking    Review of Systems  Constitutional: Negative for fever.  Gastrointestinal: Positive for diarrhea, nausea and vomiting. Negative for abdominal pain.  Genitourinary: Negative for dysuria, vaginal bleeding and vaginal discharge.   Physical Exam   Blood pressure 117/63, pulse 74, temperature 98.5 F (36.9 C), temperature source Oral, resp. rate 18, height 5\' 1"  (1.549 m), weight 151 lb (68.5 kg), last menstrual period 01/22/2017, SpO2 99 %.  Physical Exam  Nursing note  and vitals reviewed. Constitutional: She is oriented to person, place, and time. She appears well-developed and well-nourished.  HENT:  Head: Normocephalic.  Severe dental caries - almost all upper teeth are visibly decayed.  Eyes: EOM are normal.  Neck: Neck supple.  GI: Soft. Bowel sounds are normal. There is no tenderness. There is no rebound and no guarding.  Fundus just above the umbilicus  Musculoskeletal: Normal range of motion.  Neurological: She is alert and oriented to person, place, and time.  Skin: Skin is warm and dry.  Psychiatric: She has a normal mood and affect.    MAU Course  Procedures Results for orders placed or performed during the hospital encounter of 05/30/17 (from the past 24 hour(s))  Urinalysis, Routine w reflex microscopic     Status: Abnormal   Collection Time: 05/30/17  5:17 PM  Result Value Ref  Range   Color, Urine YELLOW YELLOW   APPearance CLEAR CLEAR   Specific Gravity, Urine 1.011 1.005 - 1.030   pH 7.0 5.0 - 8.0   Glucose, UA NEGATIVE NEGATIVE mg/dL   Hgb urine dipstick SMALL (A) NEGATIVE   Bilirubin Urine NEGATIVE NEGATIVE   Ketones, ur NEGATIVE NEGATIVE mg/dL   Protein, ur NEGATIVE NEGATIVE mg/dL   Nitrite NEGATIVE NEGATIVE   Leukocytes, UA TRACE (A) NEGATIVE   RBC / HPF 0-5 0 - 5 RBC/hpf   WBC, UA 0-5 0 - 5 WBC/hpf   Bacteria, UA RARE (A) NONE SEEN   Squamous Epithelial / LPF 0-5 (A) NONE SEEN   Mucus PRESENT     MDM No ketones in urine and no vomiting or diarrhea since 2:30 pm today.     Assessment and Plan  Nausea, vomiting and diarrhea  Plan Currently stable with only some nausea, but able to keep down water.' Reviewed diet extensively with client - avoid all fried foods. Start with fluids and bland foods and advance slowly. You can take immodium by the package directions (over the counter) on Saturday afternoon if the diarrhea is continuing. Keep your appointments in the office. Return if you develop fever, worsening vomiting or continual diarrhea. Note for work given to be out until Monday (only scheduled for work on Saturday).  Shira Bobst L Math Brazie 05/30/2017, 7:21 PM

## 2017-05-30 NOTE — MAU Note (Signed)
Pt reports  Vomiting and diarrhea all day today.

## 2017-05-30 NOTE — Discharge Instructions (Signed)
You can take immodium by the package directions (over the counter) on Saturday afternoon if the diarrhea is continuing. Keep your appointments in the office.

## 2017-06-04 ENCOUNTER — Encounter (HOSPITAL_COMMUNITY): Payer: Self-pay

## 2017-06-04 ENCOUNTER — Other Ambulatory Visit: Payer: Self-pay | Admitting: Family Medicine

## 2017-06-04 ENCOUNTER — Ambulatory Visit (HOSPITAL_COMMUNITY)
Admission: RE | Admit: 2017-06-04 | Discharge: 2017-06-04 | Disposition: A | Discharge status: Home / Self Care | Payer: Medicaid Other | Source: Ambulatory Visit | Attending: Family Medicine | Admitting: Family Medicine

## 2017-06-04 DIAGNOSIS — O99332 Smoking (tobacco) complicating pregnancy, second trimester: Secondary | ICD-10-CM | POA: Diagnosis not present

## 2017-06-04 DIAGNOSIS — Z3480 Encounter for supervision of other normal pregnancy, unspecified trimester: Secondary | ICD-10-CM

## 2017-06-04 DIAGNOSIS — Z3A19 19 weeks gestation of pregnancy: Secondary | ICD-10-CM | POA: Diagnosis not present

## 2017-06-04 DIAGNOSIS — F17200 Nicotine dependence, unspecified, uncomplicated: Secondary | ICD-10-CM | POA: Insufficient documentation

## 2017-06-04 DIAGNOSIS — Z3689 Encounter for other specified antenatal screening: Secondary | ICD-10-CM | POA: Insufficient documentation

## 2017-06-04 NOTE — Addendum Note (Signed)
Encounter addended by: Lenise ArenaBazemore, Leianne Callins, RDMS on: 06/04/2017 3:06 PM  Actions taken: Imaging Exam ended

## 2017-06-13 ENCOUNTER — Encounter: Payer: Self-pay | Admitting: Certified Nurse Midwife

## 2017-06-13 ENCOUNTER — Ambulatory Visit (INDEPENDENT_AMBULATORY_CARE_PROVIDER_SITE_OTHER): Payer: Medicaid Other | Admitting: Certified Nurse Midwife

## 2017-06-13 VITALS — BP 113/60 | HR 90 | Wt 153.6 lb

## 2017-06-13 DIAGNOSIS — Z3482 Encounter for supervision of other normal pregnancy, second trimester: Secondary | ICD-10-CM

## 2017-06-13 DIAGNOSIS — Z3480 Encounter for supervision of other normal pregnancy, unspecified trimester: Secondary | ICD-10-CM

## 2017-06-13 NOTE — Progress Notes (Signed)
   PRENATAL VISIT NOTE  Subjective:  Maureen Perez is a 31 y.o. 253 802 [id number] at 7369w2d being seen today for ongoing prenatal care.  She is currently monitored for the following issues for this low-risk pregnancy and has OBESITY; DEPRESSION, MODERATE, RECURRENT; TOBACCO ABUSE; TINNITUS, CHRONIC, BILATERAL; ALLERGIC RHINITIS DUE TO ANIMAL HAIR AND DANDER; INSOMNIA; Smoking complicating pregnancy, antepartum; GERD (gastroesophageal reflux disease); Acute cholecystitis; Supervision of other normal pregnancy, antepartum; and Rampant dental caries on their problem list.  Patient reports no complaints.  Contractions: Not present. Vag. Bleeding: None.  Movement: Present. Denies leaking of fluid.   The following portions of the patient's history were reviewed and updated as appropriate: allergies, current medications, past family history, past medical history, past social history, past surgical history and problem list. Problem list updated.  Objective:   Vitals:   06/13/17 1605  BP: 113/60  Pulse: 90  Weight: 153 lb 9.6 oz (69.7 kg)    Fetal Status: Fetal Heart Rate (bpm): 158   Movement: Present     General:  Alert, oriented and cooperative. Patient is in no acute distress.  Skin: Skin is warm and dry. No rash noted.   Cardiovascular: Normal heart rate noted  Respiratory: Normal respiratory effort, no problems with respiration noted  Abdomen: Soft, gravid, appropriate for gestational age.  Pain/Pressure: Absent     Pelvic: Cervical exam deferred        Extremities: Normal range of motion.  Edema: Trace  Mental Status:  Normal mood and affect. Normal behavior. Normal judgment and thought content.   Assessment and Plan:  Pregnancy: [id number] at 5869w2d  1. Supervision of other normal pregnancy, antepartum -No complaints today, patient feels well  -Discussed normal results of genetic screening  -List given for circumcision information and prenatal classes  - US MFM OB FOLLOW UP; Future for  completion of cardiac anatomy   Preterm labor symptoms and general obstetric precautions including but not limited to vaginal bleeding, contractions, leaking of fluid and fetal movement were reviewed in detail with the patient. Please refer to After Visit Summary for other counseling recommendations.  Return in about 4 weeks (around 07/11/2017) for ROB.   Sharyon CableVeronica C Krayton Wortley, CNM

## 2017-06-13 NOTE — Patient Instructions (Addendum)
Places to have your son circumcised:    [facility] [phone] while you are in hospital  [facility] [phone] $244 by 4 wks  [facility] [phone] $175 by 2 wks  [facility] [phone] $250 by 7 days [facility] [phone] $150 by 4 wks  These prices sometimes change but are roughly what you can expect to pay. Please call and confirm pricing.   Circumcision is considered an elective/non-medically necessary procedure. There are many reasons parents decide to have their sons circumsized. During the first year of life circumcised males have a reduced risk of urinary tract infections but after this year the rates between circumcised males and uncircumcised males are the same.  It is safe to have your son circumcised outside of the hospital and the places above perform them regularly.   Childbirth Education Options: [facility] Department Classes:  Childbirth education classes can help you get ready for a positive parenting experience. You can also meet other expectant parents and get free stuff for your baby. Each class runs for five weeks on the same night and costs $45 for the mother-to-be and her support person. Medicaid covers the cost if you are eligible. Call [phone] to register. [facility] Childbirth Education:  [phone] or [phone] or [email or web address]  Baby & Me Class: Discuss newborn & infant parenting and family adjustment issues with other new mothers in a relaxed environment. Each week brings a new speaker or baby-centered activity. We encourage new mothers to join [place] every Thursday at 11:00am. Babies birth until crawling. No registration or fee. Daddy [employer]: This course offers Dads-to-be the tools and knowledge needed to feel confident on their journey to becoming new fathers.  Experienced dads, who have been trained as coaches, teach dads-to-be how to hold, comfort, diaper, swaddle and play with their infant while being able to support the new mom as well. A class for men taught by men. $25/dad Big Brother/Big Sister: Let your children share in the joy of a new brother or sister in this special class designed just for them. Class includes discussion about how families care for babies: swaddling, holding, diapering, safety as well as how they can be helpful in their new role. This class is designed for children ages 70 to 78, but any age is welcome. Please register each child individually. $5/child  Mom Talk: This mom-led group offers support and connection to mothers as they journey through the adjustments and struggles of that sometimes overwhelming first year after the birth of a child. Tuesdays at 10:00am and Thursdays at 6:00pm. Babies welcome. No registration or fee. Breastfeeding Support Group: This group is a mother-to-mother support circle where moms have the opportunity to share their breastfeeding experiences. A Lactation Consultant is present for questions and concerns. Meets each Tuesday at 11:00am. No fee or registration. Breastfeeding Your Baby: Learn what to expect in the first days of breastfeeding your newborn.  This class will help you feel more confident with the skills needed to begin your breastfeeding experience. Many new mothers are concerned about breastfeeding after leaving the hospital. This class will also address the most common fears and challenges about breastfeeding during the first few weeks, months and beyond. (call for fee) Comfort Techniques and Tour: This 2 hour interactive class will provide you the opportunity to learn & practice hands-on techniques that can help relieve some of the discomfort of labor and encourage your baby to rotate toward the best position for birth. You and your partner will be able to try  a variety of labor positions with  birth balls and rebozos as well as practice breathing, relaxation, and visualization techniques. A tour of the [facility] is included with this class. $20 per registrant and support person Childbirth Class- Weekend Option: This class is a Weekend version of our Birth & Baby series. It is designed for parents who have a difficult time fitting several weeks of classes into their schedule. It covers the care of your newborn and the basics of labor and childbirth. It also includes a [facility] and lunch. The class is held two consecutive days: beginning on Friday evening from 6:30 - 8:30 p.m. and the next day, Saturday from 9 a.m. - 4 p.m. (call for fee) Doren Custard Class: Interested in a waterbirth?  This informational class will help you discover whether waterbirth is the right fit for you. Education about waterbirth itself, supplies you would need and how to assemble your support team is what you can expect from this class. Some obstetrical practices require this class in order to pursue a waterbirth. (Not all obstetrical practices offer waterbirth-check with your healthcare provider.) Register only the expectant mom, but you are encouraged to bring your partner to class! Required if planning waterbirth, no fee. Infant/Child CPR: Parents, grandparents, babysitters, and friends learn Cardio-Pulmonary Resuscitation skills for infants and children. You will also learn how to treat both conscious and unconscious choking in infants and children. This Family & Friends program does not offer certification. Register each participant individually to ensure that enough mannequins are available. (Call for fee) Grandparent Love: Expecting a grandbaby? This class is for you! Learn about the latest infant care and safety recommendations and ways to support your own child as he or she transitions into the parenting role. Taught by Registered Nurses who are  childbirth instructors, but most importantly...they are grandmothers too! $10/person. Childbirth Class- Natural Childbirth: This series of 5 weekly classes is for expectant parents who want to learn and practice natural methods of coping with the process of labor and childbirth. Relaxation, breathing, massage, visualization, role of the partner, and helpful positioning are highlighted. Participants learn how to be confident in their body's ability to give birth. This class will empower and help parents make informed decisions about their own care. Includes discussion that will help new parents transition into the immediate postpartum period. [facility] is included. We suggest taking this class between 25-32 weeks, but it's only a recommendation. $75 per registrant and one support person or $30 Medicaid. Childbirth Class- 3 week Series: This option of 3 weekly classes helps you and your labor partner prepare for childbirth. Newborn care, labor & birth, cesarean birth, pain management, and comfort techniques are discussed and a [facility] is included. The class meets at the same time, on the same day of the week for 3 consecutive weeks beginning with the starting date you choose. $60 for registrant and one support person.  Marvelous Multiples: Expecting twins, triplets, or more? This class covers the differences in labor, birth, parenting, and breastfeeding issues that face multiples' parents. NICU tour is included. Led by a Certified Childbirth Educator who is the mother of twins. No fee. Caring for Baby: This class is for expectant and adoptive parents who want to learn and practice the most up-to-date newborn care for their babies. Focus is on birth through the first six weeks of life. Topics include feeding, bathing, diapering,  crying, umbilical cord care, circumcision care and safe sleep. Parents learn to recognize symptoms of illness and  when to call the pediatrician. Register only the mom-to-be and your partner or support person can plan to come with you! $10 per registrant and support person Childbirth Class- online option: This online class offers you the freedom to complete a Birth and Baby series in the comfort of your own home. The flexibility of this option allows you to review sections at your own pace, at times convenient to you and your support people. It includes additional video information, animations, quizzes, and extended activities. Get organized with helpful eClass tools, checklists, and trackers. Once you register online for the class, you will receive an email within a few days to accept the invitation and begin the class when the time is right for you. The content will be available to you for 60 days. $60 for 60 days of online access for you and your support people.  Local Doulas: Natural Baby Doulas [email or web address] Tel: [phone] [email or web address] [employer] [phone] [email or web address] The Labor Hassell Halim  (also do waterbirth tub rental) [phone] [email or web address] [facility] Birth Doula [phone] [email or web address].fi Sacred Rhythms  [phone] [email or web address]-[email or web address] [employer] (PADA) [email or web address] [place] Birth [facility] Baby  [email or web address] Considering Waterbirth? Guide for patients at [facility] [employer]  Why consider waterbirth?  . Gentle birth for babies . Less pain medicine used in labor . May allow for passive descent/less pushing . May reduce perineal tears  . More mobility and instinctive maternal position changes . Increased maternal relaxation . Reduced blood pressure in labor  Is waterbirth safe? What are the risks of infection, drowning  or other complications?  . Infection: o Very low risk (3.7 % for tub vs 4.8% for bed) o 7 in 8000 waterbirths with documented infection o Poorly cleaned equipment most common cause o Slightly lower group B strep transmission rate  . Drowning o Maternal:  - Very low risk   - Related to seizures or fainting o Newborn:  - Very low risk. No evidence of increased risk of respiratory problems in multiple large studies - Physiological protection from breathing under water - Avoid underwater birth if there are any fetal complications - Once baby's head is out of the water, keep it out.  . Birth complication o Some reports of cord trauma, but risk decreased by bringing baby to surface gradually o No evidence of increased risk of shoulder dystocia. Mothers can usually change positions faster in water than in a bed, possibly aiding the maneuvers to free the shoulder.   You must attend a Doren Custard class at [facility]  3rd Wednesday of every month from 7-9pm  [employer] by calling [phone] or online at [email or web address]  Bring [place] the certificate from the class to your prenatal appointment  Meet with a midwife at 36 weeks to see if you can still plan a waterbirth and to sign the consent.   Purchase or rent the following supplies:   Water Birth Pool (Birth Pool in a Box or [place] for instance)  (Tubs start ~$125)  Single-use disposable tub liner designed for your brand of tub  New garden hose labeled "lead-free", "suitable for drinking water",  Electric drain pump to remove water (We recommend 792 gallon per hour or greater pump.)   Separate garden hose to remove the dirty water  Fish net  Bathing suit top (optional)  Long-handled mirror (optional)  Places to purchase or rent supplies  GotWebTools.is for tub purchases and supplies  Waterbirthsolutions.com for tub purchases and supplies  The Labor Ladies (www.[email or web address]) $275 for tub  rental/set-up & take down/kit   [employer] (http://www.fleming.com/.htm) Information regarding doulas (labor support) who provide pool rentals  Our practice has a Birth Pool in a Box tub at the hospital that you may borrow on a first-come-first-served basis. It is your responsibility to to set up, clean and break down the tub. We cannot guarantee the availability of this tub in advance. You are responsible for bringing all accessories listed above. If you do not have all necessary supplies you cannot have a waterbirth.    Things that would prevent you from having a waterbirth:  Premature, <37wks  Previous cesarean birth  Presence of thick meconium-stained fluid  Multiple gestation (Twins, triplets, etc.)  Uncontrolled diabetes or gestational diabetes requiring medication  Hypertension requiring medication or diagnosis of pre-eclampsia  Heavy vaginal bleeding  Non-reassuring fetal heart rate  Active infection (MRSA, etc.). Group B Strep is NOT a contraindication for  waterbirth.  If your labor has to be induced and induction method requires continuous  monitoring of the baby's heart rate  Other risks/issues identified by your obstetrical provider  Please remember that birth is unpredictable. Under certain unforeseeable circumstances your provider may advise against giving birth in the tub. These decisions will be made on a case-by-case basis and with the safety of you and your baby as our highest priority.  Second Trimester of Pregnancy The second trimester is from week 13 through week 28, month 4 through 6. This is often the time in pregnancy that you feel your best. Often times, morning sickness has lessened or quit. You may have more energy, and you may get hungry more often. Your unborn baby (fetus) is growing rapidly. At the end of the sixth month, he or she is about 9 inches long and weighs about 1 pounds. You will likely feel the baby move  (quickening) between 18 and 20 weeks of pregnancy. Follow these instructions at home:  Avoid all smoking, herbs, and alcohol. Avoid drugs not approved by your doctor.  Do not use any tobacco products, including cigarettes, chewing tobacco, and electronic cigarettes. If you need help quitting, ask your doctor. You may get counseling or other support to help you quit.  Only take medicine as told by your doctor. Some medicines are safe and some are not during pregnancy.  Exercise only as told by your doctor. Stop exercising if you start having cramps.  Eat regular, healthy meals.  Wear a good support bra if your breasts are tender.  Do not use hot tubs, steam rooms, or saunas.  Wear your seat belt when driving.  Avoid raw meat, uncooked cheese, and liter boxes and soil used by cats.  Take your prenatal vitamins.  Take 1500-2000 milligrams of calcium daily starting at the 20th week of pregnancy until you deliver your baby.  Try taking medicine that helps you poop (stool softener) as needed, and if your doctor approves. Eat more fiber by eating fresh fruit, vegetables, and whole grains. Drink enough fluids to keep your pee (urine) clear or pale yellow.  Take warm water baths (sitz baths) to soothe pain or discomfort caused by hemorrhoids. Use hemorrhoid cream if your doctor approves.  If you have puffy, bulging veins (varicose veins), wear support hose. Raise (elevate) your feet for 15 minutes, 3-4 times a day. Limit salt in your diet.  Avoid heavy lifting, wear low heals,  and sit up straight.  Rest with your legs raised if you have leg cramps or low back pain.  Visit your dentist if you have not gone during your pregnancy. Use a soft toothbrush to brush your teeth. Be gentle when you floss.  You can have sex (intercourse) unless your doctor tells you not to.  Go to your doctor visits. Get help if:  You feel dizzy.  You have mild cramps or pressure in your lower belly  (abdomen).  You have a nagging pain in your belly area.  You continue to feel sick to your stomach (nauseous), throw up (vomit), or have watery poop (diarrhea).  You have bad smelling fluid coming from your vagina.  You have pain with peeing (urination). Get help right away if:  You have a fever.  You are leaking fluid from your vagina.  You have spotting or bleeding from your vagina.  You have severe belly cramping or pain.  You lose or gain weight rapidly.  You have trouble catching your breath and have chest pain.  You notice sudden or extreme puffiness (swelling) of your face, hands, ankles, feet, or legs.  You have not felt the baby move in over an hour.  You have severe headaches that do not go away with medicine.  You have vision changes. This information is not intended to replace advice given to you by your health care provider. Make sure you discuss any questions you have with your health care provider. Document Released: 08/28/2009 Document Revised: 11/09/2015 Document Reviewed: 08/04/2012 Elsevier Interactive Patient Education  2017 [employer].

## 2017-06-17 NOTE — L&D Delivery Note (Addendum)
Delivery Note At 11:51 AM a viable and healthy female was delivered via Vaginal, Spontaneous (Presentation: cephalic; LOA  ).  APGAR: 9,9; Placenta status: spontaneous, intact .  Cord: 3 vessel Complications: none  Anesthesia:  epidural Episiotomy: None Lacerations:  none Suture Repair: na Est. Blood Loss (mL):  100  Mom to postpartum.  Baby to Couplet care / Skin to Skin.  Myrene Buddy 10/23/2017, 12:07 PM  Midwife attestation: I was gloved and present for delivery in its entirety and I agree with the above resident's note.  Donette Larry, CNM 12:34 PM

## 2017-06-23 ENCOUNTER — Other Ambulatory Visit: Payer: Self-pay | Admitting: Certified Nurse Midwife

## 2017-06-23 ENCOUNTER — Ambulatory Visit (HOSPITAL_COMMUNITY)
Admission: RE | Admit: 2017-06-23 | Discharge: 2017-06-23 | Disposition: A | Discharge status: Home / Self Care | Payer: Medicaid Other | Source: Ambulatory Visit | Attending: Certified Nurse Midwife | Admitting: Certified Nurse Midwife

## 2017-06-23 DIAGNOSIS — O321XX0 Maternal care for breech presentation, not applicable or unspecified: Secondary | ICD-10-CM | POA: Diagnosis not present

## 2017-06-23 DIAGNOSIS — Z0489 Encounter for examination and observation for other specified reasons: Secondary | ICD-10-CM | POA: Diagnosis present

## 2017-06-23 DIAGNOSIS — IMO0002 Reserved for concepts with insufficient information to code with codable children: Secondary | ICD-10-CM

## 2017-06-23 DIAGNOSIS — Z3A21 21 weeks gestation of pregnancy: Secondary | ICD-10-CM | POA: Insufficient documentation

## 2017-06-23 DIAGNOSIS — Z3480 Encounter for supervision of other normal pregnancy, unspecified trimester: Secondary | ICD-10-CM

## 2017-06-26 ENCOUNTER — Inpatient Hospital Stay (HOSPITAL_COMMUNITY)
Admission: AD | Admit: 2017-06-26 | Discharge: 2017-06-26 | Disposition: A | Discharge status: Home / Self Care | Payer: Medicaid Other | Source: Ambulatory Visit | Attending: Obstetrics and Gynecology | Admitting: Obstetrics and Gynecology

## 2017-06-26 ENCOUNTER — Encounter (HOSPITAL_COMMUNITY): Payer: Self-pay | Admitting: *Deleted

## 2017-06-26 ENCOUNTER — Telehealth: Payer: Self-pay | Admitting: Certified Nurse Midwife

## 2017-06-26 DIAGNOSIS — O26852 Spotting complicating pregnancy, second trimester: Secondary | ICD-10-CM | POA: Diagnosis not present

## 2017-06-26 DIAGNOSIS — R102 Pelvic and perineal pain: Secondary | ICD-10-CM | POA: Diagnosis present

## 2017-06-26 DIAGNOSIS — O99332 Smoking (tobacco) complicating pregnancy, second trimester: Secondary | ICD-10-CM | POA: Insufficient documentation

## 2017-06-26 DIAGNOSIS — F17210 Nicotine dependence, cigarettes, uncomplicated: Secondary | ICD-10-CM | POA: Insufficient documentation

## 2017-06-26 DIAGNOSIS — N949 Unspecified condition associated with female genital organs and menstrual cycle: Secondary | ICD-10-CM

## 2017-06-26 DIAGNOSIS — Z3A22 22 weeks gestation of pregnancy: Secondary | ICD-10-CM | POA: Diagnosis not present

## 2017-06-26 DIAGNOSIS — O26892 Other specified pregnancy related conditions, second trimester: Secondary | ICD-10-CM | POA: Insufficient documentation

## 2017-06-26 LAB — URINALYSIS, ROUTINE W REFLEX MICROSCOPIC
BILIRUBIN URINE: NEGATIVE
Glucose, UA: NEGATIVE mg/dL
Hgb urine dipstick: NEGATIVE
Ketones, ur: NEGATIVE mg/dL
Nitrite: NEGATIVE
PH: 6 (ref 5.0–8.0)
Protein, ur: NEGATIVE mg/dL
Specific Gravity, Urine: 1.017 (ref 1.005–1.030)

## 2017-06-26 NOTE — MAU Note (Signed)
Pt started spotting last night, also started cramping.  Is seeing pink spotting with wiping, cramping continues this morning.

## 2017-06-26 NOTE — MAU Provider Note (Signed)
Chief Complaint:  Abdominal Pain and Vaginal Bleeding   First Provider Initiated Contact with Patient 06/26/17 0936     HPI: Maureen Perez is a 32 y.o. [id number] at [redacted]w[redacted]d who presents to maternity admissions reporting contractions and spotting.  Last night she noticed pink spots on tissue when wiping.  This has only occurred with wiping. When asked to clarify, patient says that lower abdominal pain occurs with movement, specifically bending down.   She reports good fetal movement, vaginal itching/burning, urinary symptoms, h/a, dizziness, n/v, diarrhea, constipation or fever/chills.  She denies headache, visual changes or RUQ abdominal pain.   Past Medical History: Past Medical History:  Diagnosis Date  . Abnormal Pap smear    repeat WNL  . Back pain   . Cholelithiasis   . Depression    no meds since pregancny  . Family history of adverse reaction to anesthesia    " MOTHER TAKES ALONG TIME TO WAKE "  . Infection    UTI  . Injury of tendon of left rotator cuff   . Obesity   . PONV (postoperative nausea and vomiting)     Past obstetric history: OB History  Gravida Para Term Preterm AB Living  5 2 2  0 2 2  SAB TAB Ectopic Multiple Live Births  2 0 0 0 2    # Outcome Date GA Lbr Len/2nd Weight Sex Delivery Anes PTL Lv  5 Current           4 Term 08/09/13 [redacted]w[redacted]d 04:44 / 00:08 2.54 kg (5 lb 9.6 oz) F Vag-Spont EPI  LIV  3 Term 09/01/11 [redacted]w[redacted]d 21:43 / 01:13 2.805 kg (6 lb 2.9 oz) F Vag-Spont EPI  LIV     Birth Comments: No anomalies noted  2 SAB           1 SAB  [redacted]w[redacted]d             Past Surgical History: Past Surgical History:  Procedure Laterality Date  . CHOLECYSTECTOMY N/A 12/05/2014   Procedure: LAPAROSCOPIC CHOLECYSTECTOMY WITH INTRAOPERATIVE CHOLANGIOGRAM;  Surgeon: Chevis Pretty III, MD;  Location: [facility] OR;  Service: General;  Laterality: N/A;  . TONSILLECTOMY     age 39  . tubes in ears  age 60    Family History: Family History  Problem Relation Age of Onset  .  Depression Mother   . Diabetes Mother   . Hypertension Mother   . COPD Mother   . Anesthesia problems Mother   . Depression Sister   . Diabetes Sister   . Kidney disease Sister   . Hypertension Sister   . Hypertension Father   . Anesthesia problems Father   . COPD Father     Social History: Social History   Tobacco Use  . Smoking status: Current Every Day Smoker    Packs/day: 0.50    Years: 10.00    Pack years: 5.00    Types: Cigarettes  . Smokeless tobacco: Never Used  Substance Use Topics  . Alcohol use: No    Alcohol/week: 0.0 oz  . Drug use: No    Allergies:  Allergies  Allergen Reactions  . Cephalexin Itching and Nausea And Vomiting  . Percocet [Oxycodone-Acetaminophen] Nausea And Vomiting    Meds:  Medications Prior to Admission  Medication Sig Dispense Refill Last Dose  . amoxicillin (AMOXIL) 500 MG capsule Take 500 mg by mouth 4 (four) times daily.   06/25/2017 at Unknown time  . ibuprofen (ADVIL,MOTRIN) 800 MG tablet Take  800 mg by mouth every 8 (eight) hours as needed for moderate pain.   06/25/2017 at Unknown time  . Prenatal Multivit-Min-Fe-FA (PRENATAL VITAMINS PO) Take 1 tablet by mouth daily.   06/26/2017 at Unknown time  . [DISCONTINUED] acetaminophen (TYLENOL) 325 MG tablet Take 650 mg by mouth every 6 (six) hours as needed for moderate pain.    Taking    I have reviewed patient's Past Medical Hx, Surgical Hx, Family Hx, Social Hx, medications and allergies.   ROS: Other systems negative  Physical Exam   Patient Vitals for the past 24 hrs:  BP Temp Temp src Pulse Resp Height Weight  06/26/17 0922 121/61 97.6 F (36.4 C) Oral 79 16 - -  06/26/17 0912 - - - - - 5\' 1"  (1.549 m) 71.7 kg (158 lb)   Constitutional: Well-developed, well-nourished female in no acute distress.  Cardiovascular: normal rate and rhythm Respiratory: normal effort, clear to auscultation bilaterally GI: Abd soft, gravid appropriate for gestational age.   No rebound or  guarding. Tenderness to light palpation in LLQ, tenderness to deep palpation suprapubic and RLQ. No masses on palpation. MS: Extremities nontender, no edema, normal ROM Neurologic: Alert and oriented x 4.  GU: Neg CVAT.  PELVIC EXAM: Cervix pink, visually closed, without lesion, scant white creamy discharge, vaginal walls and external genitalia normal BIMANUAL EXAM: Cervix firm, posterior, neg CMT, uterus nontender, fundal Height consistent with dates.  FHR: Doppler, 150 bpm   Labs: Results for orders placed or performed during the hospital encounter of 06/26/17 (from the past 24 hour(s))  Urinalysis, Routine w reflex microscopic     Status: Abnormal   Collection Time: 06/26/17  9:10 AM  Result Value Ref Range   Color, Urine YELLOW YELLOW   APPearance CLOUDY (A) CLEAR   Specific Gravity, Urine 1.017 1.005 - 1.030   pH 6.0 5.0 - 8.0   Glucose, UA NEGATIVE NEGATIVE mg/dL   Hgb urine dipstick NEGATIVE NEGATIVE   Bilirubin Urine NEGATIVE NEGATIVE   Ketones, ur NEGATIVE NEGATIVE mg/dL   Protein, ur NEGATIVE NEGATIVE mg/dL   Nitrite NEGATIVE NEGATIVE   Leukocytes, UA LARGE (A) NEGATIVE   RBC / HPF 6-30 0 - 5 RBC/hpf   WBC, UA 0-5 0 - 5 WBC/hpf   Bacteria, UA RARE (A) NONE SEEN   Squamous Epithelial / LPF 6-30 (A) NONE SEEN   Mucus PRESENT    O/Positive/-- (10/25 [id number])  Imaging:  N/A  MAU Course/MDM: I have ordered labs and reviewed results.  FHR: 150 bpm by Doppler  Assessment: 1. Spotting during pregnancy in second trimester   2. Round ligament pain    Spotting Pink spotting limited to wiping.  No blood in cervix or vaginal vault on speculum exam, cervix closed on bimanual exam.   Round ligament pain History and physical exam consistent with round ligament pain.  Managed patient expectations. Advised patient to lie on side that is painful, recommended Tylenol every 6 hours as needed.   Plan: Discharge home Information on RLP given Follow up in Office for prenatal  visits and recheck, next appointment 07/11/17.  Pt stable at time of discharge.  Larose HiresChristopher Trennepohl, MS3 06/26/2017 10:10 AM   I confirm that I have verified the information documented in the medical student's note and that I have also personally reperformed the physical exam and all medical decision making activities.   Raelyn MoraRolitta Eudell Mcphee, CNM  06/26/2017 12:57 PM

## 2017-07-02 NOTE — Telephone Encounter (Signed)
Error

## 2017-07-11 ENCOUNTER — Ambulatory Visit (INDEPENDENT_AMBULATORY_CARE_PROVIDER_SITE_OTHER): Payer: Medicaid Other | Admitting: Obstetrics and Gynecology

## 2017-07-11 VITALS — BP 129/65 | HR 84 | Wt 158.9 lb

## 2017-07-11 DIAGNOSIS — O26892 Other specified pregnancy related conditions, second trimester: Secondary | ICD-10-CM

## 2017-07-11 DIAGNOSIS — N898 Other specified noninflammatory disorders of vagina: Secondary | ICD-10-CM

## 2017-07-11 DIAGNOSIS — O99330 Smoking (tobacco) complicating pregnancy, unspecified trimester: Secondary | ICD-10-CM

## 2017-07-11 DIAGNOSIS — O99332 Smoking (tobacco) complicating pregnancy, second trimester: Secondary | ICD-10-CM

## 2017-07-11 DIAGNOSIS — Z3480 Encounter for supervision of other normal pregnancy, unspecified trimester: Secondary | ICD-10-CM

## 2017-07-11 DIAGNOSIS — Z3482 Encounter for supervision of other normal pregnancy, second trimester: Secondary | ICD-10-CM

## 2017-07-11 NOTE — Progress Notes (Signed)
Subjective:  Maureen Perez is a 32 y.o. [id number] at 559w2d being seen today for ongoing prenatal care.  She is currently monitored for the following issues for this low-risk pregnancy and has OBESITY; DEPRESSION, MODERATE, RECURRENT; TOBACCO ABUSE; TINNITUS, CHRONIC, BILATERAL; ALLERGIC RHINITIS DUE TO ANIMAL HAIR AND DANDER; INSOMNIA; Tobacco use disorder affecting pregnancy, antepartum; GERD (gastroesophageal reflux disease); Supervision of other normal pregnancy, antepartum; and Rampant dental caries on their problem list.  Patient reports vaginal discharge. States that it is watery and increased from her usual. She denies any odor or irritating features to discharge.  Contractions: Not present. Vag. Bleeding: None.  Movement: Present. Denies leaking of fluid.   The following portions of the patient's history were reviewed and updated as appropriate: allergies, current medications, past family history, past medical history, past social history, past surgical history and problem list. Problem list updated.  Objective:   Vitals:   07/11/17 1408  BP: 129/65  Pulse: 84  Weight: 72.1 kg (158 lb 14.4 oz)    Fetal Status: Fetal Heart Rate (bpm): 159 Fundal Height: 24 cm Movement: Present     General:  Alert, oriented and cooperative. Patient is in no acute distress.  Skin: Skin is warm and dry. No rash noted.   Cardiovascular: Normal heart rate noted  Respiratory: Normal respiratory effort, no problems with respiration noted  Abdomen: Soft, gravid, appropriate for gestational age. Pain/Pressure: Present     Pelvic: Vag. Bleeding: None Vag D/C Character: Watery   Cervical exam deferred        Extremities: Normal range of motion.  Edema: None  Mental Status: Normal mood and affect. Normal behavior. Normal judgment and thought content.   Urinalysis:      Assessment and Plan:  Pregnancy: [id number] at 689w2d  1. Supervision of other normal pregnancy, antepartum Doing well. 28wk labs at next  visit.   2. Tobacco use disorder affecting pregnancy, antepartum Continues to smoke tobacco but has reduced her amount to 3 cigs/day. Normal fetal growth.   3. Vaginal discharge during pregnancy in second trimester Patient wants to wait until her 28wk swabs to be tested. Discharge not concerning at this time.  Preterm labor symptoms and general obstetric precautions including but not limited to vaginal bleeding, contractions, leaking of fluid and fetal movement were reviewed in detail with the patient. Please refer to After Visit Summary for other counseling recommendations.  Return in about 4 weeks (around 08/08/2017) for ob visit.   Pincus LargePhelps, Gaylynn Seiple Y, DO

## 2017-07-11 NOTE — Progress Notes (Signed)
Pt states watery d/c but no odor

## 2017-07-11 NOTE — Patient Instructions (Signed)
Smoking During Pregnancy Smoking during pregnancy is unhealthy for you and your baby. Smoke from cigarettes, pipes, and cigars contains many chemicals that can cause cancer (carcinogens). Cigarettes also contain a stimulant drug (nicotine). When you smoke, harmful substances that you breathe in enter your bloodstream and can be passed on to your baby. This can affect your baby's development. If you are planning to become pregnant or have recently become pregnant, talk with your health care provider about quitting smoking. How does smoking affect me? Smoking increases your risk for many long-term (chronic) diseases. These diseases include cancer, lung diseases, and heart disease. Smoking during pregnancy increases your risk of:  Losing the pregnancy (miscarriage or stillbirth).  Giving birth too early (premature birth).  Pregnancy outside of the uterus (tubal pregnancy).  Having problems with the organ that provides the baby nourishment and oxygen (placenta), including: ? Attachment of the placenta over the opening of the uterus (placenta previa). ? Detachment of the placenta before the baby's birth (placental abruption).  Having your water break before labor begins (premature rupture of membranes).  How does smoking affect my baby? Before Birth Smoking during pregnancy:  Decreases blood flow and oxygen to your baby.  Increases your baby's risk of birth defects, such as heart defects.  Increases your baby's heart rate.  Slows your baby's growth in the uterus (intrauterine growth retardation).  After Birth Babies born to women who smoked during pregnancy may:  Have symptoms of nicotine withdrawal.  Need to stay in the hospital for special care.  May be too small at birth.  Have a high risk of: ? Serious health problems or lifelong disabilities. ? Sudden infant death syndrome (SIDS). ? Becoming obese. ? Developing behavior or learning problems.  What can happen if changes  are not made? When babies are born with a birth defect or illness, they often need to stay in the hospital longer before going home. Hospital stays may also be longer if you had any complications during labor or delivery. Longer hospital stays and more treatments result in higher costs for health care. Many health issues among babies born to mothers who smoke can have a lifelong impact. This may include the long-term need for certain medicines, therapies, or other treatments. What are the benefits of not smoking during pregnancy? You have a much better chance of having a healthy pregnancy and a healthy baby if you do not smoke while you are pregnant. Not smoking also means that you will have a better chance of living a long and healthy life, and your baby will have a better chance of growing into a healthy child and adult. What actions can be taken? Quitting smoking can be difficult. Ask your health care provider for help to stop smoking. You may also consider:  Counseling to help you quit smoking (smoking cessation counseling).  Psychotherapy.  Acupuncture.  Hypnosis.  Telephone QUIT hotlines.  If these methods do not help you, talk with your health care provider about other options. Do not take smoking cessation medicines or nicotine supplements unless your health care provider tells you to. Where to find more information: Learn more about smoking during pregnancy and quitting smoking from:  March of Dimes: www.marchofdimes.org/pregnancy/smoking-during-pregnancy.aspx  U.S. Department of Health and Human Services: women.smokefree.gov  American Cancer Society: www.cancer.org  American Heart Association: www.heart.org  National Cancer Institute: www.cancer.gov   For help to quit smoking:  National smoking cessation telephone hotline: 1-800-QUIT NOW (784-8669)  Contact a health care provider if:  You are   struggling to quit smoking.  You are a smoker and you become pregnant or  plan to become pregnant.  You start smoking again after giving birth. Summary  Tobacco smoke contains harmful substances that can affect a baby's health and development.  Smoking increases the risk for serious problems, such as miscarriage, birth defects, or premature birth.  If you need help to quit smoking, ask your health care provider. This information is not intended to replace advice given to you by your health care provider. Make sure you discuss any questions you have with your health care provider. Document Released: 10/15/2004 Document Revised: 03/22/2016 Document Reviewed: 03/22/2016 Elsevier Interactive Patient Education  2018 Elsevier Inc.  

## 2017-08-01 ENCOUNTER — Inpatient Hospital Stay (HOSPITAL_COMMUNITY)
Admission: AD | Admit: 2017-08-01 | Discharge: 2017-08-01 | Disposition: A | Discharge status: Home / Self Care | Payer: Medicaid Other | Source: Ambulatory Visit | Attending: Obstetrics and Gynecology | Admitting: Obstetrics and Gynecology

## 2017-08-01 ENCOUNTER — Encounter (HOSPITAL_COMMUNITY): Payer: Self-pay | Admitting: *Deleted

## 2017-08-01 ENCOUNTER — Other Ambulatory Visit: Payer: Self-pay

## 2017-08-01 DIAGNOSIS — Z9049 Acquired absence of other specified parts of digestive tract: Secondary | ICD-10-CM | POA: Insufficient documentation

## 2017-08-01 DIAGNOSIS — Z833 Family history of diabetes mellitus: Secondary | ICD-10-CM | POA: Insufficient documentation

## 2017-08-01 DIAGNOSIS — O99342 Other mental disorders complicating pregnancy, second trimester: Secondary | ICD-10-CM | POA: Diagnosis not present

## 2017-08-01 DIAGNOSIS — Z881 Allergy status to other antibiotic agents status: Secondary | ICD-10-CM | POA: Insufficient documentation

## 2017-08-01 DIAGNOSIS — Z818 Family history of other mental and behavioral disorders: Secondary | ICD-10-CM | POA: Diagnosis not present

## 2017-08-01 DIAGNOSIS — Z825 Family history of asthma and other chronic lower respiratory diseases: Secondary | ICD-10-CM | POA: Diagnosis not present

## 2017-08-01 DIAGNOSIS — Z8249 Family history of ischemic heart disease and other diseases of the circulatory system: Secondary | ICD-10-CM | POA: Diagnosis not present

## 2017-08-01 DIAGNOSIS — Z841 Family history of disorders of kidney and ureter: Secondary | ICD-10-CM | POA: Diagnosis not present

## 2017-08-01 DIAGNOSIS — O99332 Smoking (tobacco) complicating pregnancy, second trimester: Secondary | ICD-10-CM | POA: Diagnosis not present

## 2017-08-01 DIAGNOSIS — R109 Unspecified abdominal pain: Secondary | ICD-10-CM | POA: Diagnosis not present

## 2017-08-01 DIAGNOSIS — F329 Major depressive disorder, single episode, unspecified: Secondary | ICD-10-CM | POA: Diagnosis not present

## 2017-08-01 DIAGNOSIS — O99612 Diseases of the digestive system complicating pregnancy, second trimester: Secondary | ICD-10-CM | POA: Diagnosis not present

## 2017-08-01 DIAGNOSIS — K429 Umbilical hernia without obstruction or gangrene: Secondary | ICD-10-CM

## 2017-08-01 DIAGNOSIS — O26892 Other specified pregnancy related conditions, second trimester: Secondary | ICD-10-CM | POA: Diagnosis present

## 2017-08-01 DIAGNOSIS — Z885 Allergy status to narcotic agent status: Secondary | ICD-10-CM | POA: Diagnosis not present

## 2017-08-01 DIAGNOSIS — O99212 Obesity complicating pregnancy, second trimester: Secondary | ICD-10-CM | POA: Insufficient documentation

## 2017-08-01 DIAGNOSIS — Z9889 Other specified postprocedural states: Secondary | ICD-10-CM | POA: Insufficient documentation

## 2017-08-01 DIAGNOSIS — E669 Obesity, unspecified: Secondary | ICD-10-CM | POA: Diagnosis not present

## 2017-08-01 DIAGNOSIS — Z79899 Other long term (current) drug therapy: Secondary | ICD-10-CM | POA: Diagnosis not present

## 2017-08-01 DIAGNOSIS — Z3A27 27 weeks gestation of pregnancy: Secondary | ICD-10-CM | POA: Diagnosis not present

## 2017-08-01 DIAGNOSIS — O9989 Other specified diseases and conditions complicating pregnancy, childbirth and the puerperium: Secondary | ICD-10-CM

## 2017-08-01 DIAGNOSIS — F17210 Nicotine dependence, cigarettes, uncomplicated: Secondary | ICD-10-CM | POA: Diagnosis not present

## 2017-08-01 DIAGNOSIS — M6208 Separation of muscle (nontraumatic), other site: Secondary | ICD-10-CM | POA: Diagnosis not present

## 2017-08-01 LAB — WET PREP, GENITAL
CLUE CELLS WET PREP: NONE SEEN
Trich, Wet Prep: NONE SEEN
YEAST WET PREP: NONE SEEN

## 2017-08-01 LAB — URINALYSIS, ROUTINE W REFLEX MICROSCOPIC
Bilirubin Urine: NEGATIVE
Glucose, UA: NEGATIVE mg/dL
Hgb urine dipstick: NEGATIVE
Ketones, ur: NEGATIVE mg/dL
LEUKOCYTES UA: NEGATIVE
NITRITE: NEGATIVE
PH: 6 (ref 5.0–8.0)
Protein, ur: NEGATIVE mg/dL
SPECIFIC GRAVITY, URINE: 1.005 (ref 1.005–1.030)

## 2017-08-01 MED ORDER — COMFORT FIT MATERNITY SUPP MED MISC: 1.0000 | Freq: Every day | 0 refills | Status: DC

## 2017-08-01 NOTE — MAU Note (Signed)
Pt presents with c/o intermittent sharp abdominal pains with bending over and walking.  Denies VB or LOF.  Reports +FM.

## 2017-08-01 NOTE — MAU Provider Note (Signed)
History     CSN: [id number]  Arrival date and time: 08/01/17 1752   First Provider Initiated Contact with Patient 08/01/17 1842      Chief Complaint  Patient presents with  . Abdominal Pain   Maureen Perez is a 32 y.o. [id number] at [redacted]w[redacted]d who presents today with abdominal pain. She states that it has been off and on for a couple of days, but was worse today after picking up something at work. She denies any VB or LOF. She reports normal fetal movement.    Abdominal Pain  This is a new problem. The current episode started in the past 7 days. The problem occurs intermittently. The problem has been gradually worsening. The pain is located in the periumbilical region. The pain is at a severity of 7/10. The quality of the pain is sharp. The abdominal pain does not radiate. Pertinent negatives include no dysuria, fever, frequency, nausea or vomiting. Exacerbated by: bending over and walking.  The pain is relieved by being still. She has tried nothing for the symptoms.      Past Medical History:  Diagnosis Date  . Abnormal Pap smear    repeat WNL  . Back pain   . Cholelithiasis   . Depression    no meds since pregancny  . Family history of adverse reaction to anesthesia    " MOTHER TAKES ALONG TIME TO WAKE "  . Infection    UTI  . Injury of tendon of left rotator cuff   . Obesity   . PONV (postoperative nausea and vomiting)     Past Surgical History:  Procedure Laterality Date  . CHOLECYSTECTOMY N/A 12/05/2014   Procedure: LAPAROSCOPIC CHOLECYSTECTOMY WITH INTRAOPERATIVE CHOLANGIOGRAM;  Surgeon: Chevis Pretty III, MD;  Location: [facility] OR;  Service: General;  Laterality: N/A;  . TONSILLECTOMY     age 26  . tubes in ears  age 28    Family History  Problem Relation Age of Onset  . Depression Mother   . Diabetes Mother   . Hypertension Mother   . COPD Mother   . Anesthesia problems Mother   . Depression Sister   . Diabetes Sister   . Kidney disease Sister   . Hypertension  Sister   . Hypertension Father   . Anesthesia problems Father   . COPD Father     Social History   Tobacco Use  . Smoking status: Current Every Day Smoker    Packs/day: 0.50    Years: 10.00    Pack years: 5.00    Types: Cigarettes  . Smokeless tobacco: Never Used  Substance Use Topics  . Alcohol use: No    Alcohol/week: 0.0 oz  . Drug use: No    Allergies:  Allergies  Allergen Reactions  . Cephalexin Itching and Nausea And Vomiting  . Percocet [Oxycodone-Acetaminophen] Nausea And Vomiting    Medications Prior to Admission  Medication Sig Dispense Refill Last Dose  . Prenatal Multivit-Min-Fe-FA (PRENATAL VITAMINS PO) Take 1 tablet by mouth daily.   Taking    Review of Systems  Constitutional: Negative for chills and fever.  Gastrointestinal: Positive for abdominal pain. Negative for nausea and vomiting.  Genitourinary: Negative for dysuria, frequency, pelvic pain, vaginal bleeding and vaginal discharge.   Physical Exam   Blood pressure 114/63, pulse 92, temperature 99.2 F (37.3 C), temperature source Oral, resp. rate 20, height 5\' 1"  (1.549 m), weight 160 lb 8 oz (72.8 kg), last menstrual period 01/22/2017, SpO2 99 %.  Physical  Exam  Nursing note and vitals reviewed. Constitutional: She is oriented to person, place, and time. She appears well-developed and well-nourished. No distress.  HENT:  Head: Normocephalic.  Cardiovascular: Normal rate.  Respiratory: Effort normal.  GI: Soft. There is no tenderness. A hernia (very small umbilical hernia. Easily reducable. ) is present.  Genitourinary:  Genitourinary Comments:  External: no lesion Vagina: small amount of white discharge Cervix: pink, smooth, closed/thick  Uterus: AGA   Neurological: She is alert and oriented to person, place, and time.  Skin: Skin is warm and dry.  Psychiatric: She has a normal mood and affect.   Results for orders placed or performed during the hospital encounter of 08/01/17 (from  the past 24 hour(s))  Urinalysis, Routine w reflex microscopic     Status: Abnormal   Collection Time: 08/01/17  6:15 PM  Result Value Ref Range   Color, Urine STRAW (A) YELLOW   APPearance CLEAR CLEAR   Specific Gravity, Urine 1.005 1.005 - 1.030   pH 6.0 5.0 - 8.0   Glucose, UA NEGATIVE NEGATIVE mg/dL   Hgb urine dipstick NEGATIVE NEGATIVE   Bilirubin Urine NEGATIVE NEGATIVE   Ketones, ur NEGATIVE NEGATIVE mg/dL   Protein, ur NEGATIVE NEGATIVE mg/dL   Nitrite NEGATIVE NEGATIVE   Leukocytes, UA NEGATIVE NEGATIVE  Wet prep, genital     Status: Abnormal   Collection Time: 08/01/17  6:50 PM  Result Value Ref Range   Yeast Wet Prep HPF POC NONE SEEN NONE SEEN   Trich, Wet Prep NONE SEEN NONE SEEN   Clue Cells Wet Prep HPF POC NONE SEEN NONE SEEN   WBC, Wet Prep HPF POC MODERATE (A) NONE SEEN   Sperm PRESENT    FHT: 145, moderate with 15x15 accels, no decels Toco: no UCs  MAU Course  Procedures  MDM   Assessment and Plan   1. Umbilical hernia without obstruction and without gangrene   2. [redacted] weeks gestation of pregnancy   3. Diastasis recti    DC home Comfort measures reviewed  3rd Trimester precautions  PTL precautions  Fetal kick counts RX: abdominal support belt daily disp#1 device  Return to MAU as needed FU with OB as planned  Follow-up Information    [facility].   Specialty:  Obstetrics and Gynecology Contact information: [address] [place] [id number] [phone]           Thressa ShellerHeather Hogan 08/01/2017, 7:29 PM

## 2017-08-01 NOTE — Discharge Instructions (Signed)
Diastasis Recti Diastasis recti is when the muscles of the abdomen (rectus abdominis muscles) become thin and separate. The result is a wider space between the right and left abdomen (abdominal) muscles. This wider space between the muscles may cause a bulge in the middle of your abdomen. You may notice this bulge when you are straining or when you sit up from a lying down position. Diastasis recti can affect men and women. It is most common among pregnant women, infants, people who are obese, and people who have had abdominal surgery. Exercise or surgical treatment may help correct it. What are the causes? Common causes of this condition include:  Pregnancy. The growing uterus puts pressure on the abdominal muscles, which causes the muscles to separate.  Obesity. Excess fat puts pressure on abdominal muscles.  Weightlifting.  Some abdomen exercises.  Advanced age.  Genetics.  Prior abdominal surgery.  What increases the risk? This condition is more likely to develop in:  Women.  Newborns, especially newborns who are born early (prematurely).  What are the signs or symptoms? Common symptoms of this condition include:  A bulge in the middle of the abdomen. You will notice it most when you sit up or strain.  Pain in the low back, pelvis, or hips.  Constipation.  Inability to control when you urinate (urinary incontinence).  Bloating.  Poor posture.  How is this diagnosed? This condition is diagnosed with a physical exam. Your health care provider will ask you to lie flat on your back and do a crunch or half sit-up. If you have diastasis recti, a vertical bulge will appear between your abdominal muscles in the center of your abdomen. Your health care provider will measure the gap between your muscles with one of the following:  A medical device used to measure the space between two objects (caliper).  A tape measure.  CT scan.  Ultrasound.  Finger spaces. Your health  care provider will measure the space using their fingers.  How is this treated? If your muscle separation is not too large, you may not need treatment. However, if you are a woman who plans to become pregnant again, you should treat this condition before your next pregnancy. Treatment may include:  Physical therapy to strengthen and tighten your abdominal muscles.  Lifestyle changes such as weight loss and exercise.  Over-the-counter pain medicines as needed.  Surgery to correct the separation.  Follow these instructions at home: Activity  Return to your normal activities as told by your health care provider. Ask your health care provider what activities are safe for you.  When lifting weights or doing exercises using your abdominal muscles or the muscles in the center of your body that give stability (core muscles), make sure you are doing your exercises and movements correctly. Proper form can help to prevent the condition from happening again. General instructions  If you are overweight, ask your health care provider for help with weight loss. Losing even a small amount of weight can help to improve your diastasis recti.  Take over-the-counter or prescription medicines only as told by your health care provider.  Do not strain. Straining can make the separation worse. Examples of straining include: ? Pushing hard to have a bowel movement, such as due to constipation. ? Lifting heavy objects, including children. ? Standing up and sitting down.  Take steps to prevent constipation: ? Drink enough fluid to keep your urine clear or pale yellow. ? Take over-the-counter or prescription medicines only as   directed. ? Eat foods that are high in fiber, such as fresh fruits and vegetables, whole grains, and beans. ? Limit foods that are high in fat and processed sugars, such as fried and sweet foods. Contact a health care provider if:  You notice a new bulge in your abdomen. Get help  right away if:  You experience severe discomfort in your abdomen.  You develop severe abdominal pain along with nausea, vomiting, or fever. Summary  Diastasis recti is when the abdomen (abdominal) muscles become thin and separate. Your abdomen will stick out because the space between your right and left abdomen muscles has widened.  The most common symptom is a bulge in your abdomen. You will notice it most when you sit up or are straining.  This condition is diagnosed during a physical exam.  If the abdomen separation is not too big, you may choose not to have treatment. Otherwise, you may need to undergo physical therapy or surgery. This information is not intended to replace advice given to you by your health care provider. Make sure you discuss any questions you have with your health care provider. Document Released: 07/29/2016 Document Revised: 07/29/2016 Document Reviewed: 07/29/2016 Elsevier Interactive Patient Education  2018 Elsevier Inc.  

## 2017-08-04 ENCOUNTER — Telehealth: Payer: Self-pay | Admitting: Obstetrics & Gynecology

## 2017-08-04 LAB — GC/CHLAMYDIA PROBE AMP (~~LOC~~) NOT AT ARMC
CHLAMYDIA, DNA PROBE: NEGATIVE
NEISSERIA GONORRHEA: NEGATIVE

## 2017-08-04 NOTE — Telephone Encounter (Signed)
Patient called requesting to speak with a nurse. Would not get into details to why she is calling

## 2017-08-12 ENCOUNTER — Ambulatory Visit (INDEPENDENT_AMBULATORY_CARE_PROVIDER_SITE_OTHER): Payer: Medicaid Other | Admitting: Medical

## 2017-08-12 ENCOUNTER — Other Ambulatory Visit: Payer: Medicaid Other

## 2017-08-12 VITALS — BP 116/68 | HR 96 | Wt 159.1 lb

## 2017-08-12 DIAGNOSIS — Z3483 Encounter for supervision of other normal pregnancy, third trimester: Secondary | ICD-10-CM

## 2017-08-12 DIAGNOSIS — Z23 Encounter for immunization: Secondary | ICD-10-CM | POA: Diagnosis not present

## 2017-08-12 DIAGNOSIS — Z3480 Encounter for supervision of other normal pregnancy, unspecified trimester: Secondary | ICD-10-CM

## 2017-08-12 NOTE — Progress Notes (Signed)
   PRENATAL VISIT NOTE  Subjective:  Maureen Perez is a 32 y.o. [phone] at 6883w6d being seen today for ongoing prenatal care.  She is currently monitored for the following issues for this low-risk pregnancy and has OBESITY; DEPRESSION, MODERATE, RECURRENT; TOBACCO ABUSE; TINNITUS, CHRONIC, BILATERAL; ALLERGIC RHINITIS DUE TO ANIMAL HAIR AND DANDER; INSOMNIA; Tobacco use disorder affecting pregnancy, antepartum; GERD (gastroesophageal reflux disease); Supervision of other normal pregnancy, antepartum; and Rampant dental caries on their problem list.  Patient reports no complaints.  Contractions: Not present. Vag. Bleeding: None.  Movement: Present. Denies leaking of fluid.   The following portions of the patient's history were reviewed and updated as appropriate: allergies, current medications, past family history, past medical history, past social history, past surgical history and problem list. Problem list updated.  Objective:   Vitals:   08/12/17 0833  BP: 116/68  Pulse: 96  Weight: 159 lb 1.6 oz (72.2 kg)    Fetal Status: Fetal Heart Rate (bpm): 140 Fundal Height: 27 cm Movement: Present     General:  Alert, oriented and cooperative. Patient is in no acute distress.  Skin: Skin is warm and dry. No rash noted.   Cardiovascular: Normal heart rate noted  Respiratory: Normal respiratory effort, no problems with respiration noted  Abdomen: Soft, gravid, appropriate for gestational age.  Pain/Pressure: Present     Pelvic: Cervical exam deferred        Extremities: Normal range of motion.  Edema: Trace  Mental Status:  Normal mood and affect. Normal behavior. Normal judgment and thought content.   Assessment and Plan:  Pregnancy: [id number] at 1983w6d  1. Supervision of other normal pregnancy, antepartum - CBC - RPR - HIV antibody (with reflex) - Glucose Tolerance, 2 Hours w/1 Hour - Tdap vaccine greater than or equal to 7yo IM - Work restrictions note given at patient's request.  Works for a [employer] and is often asked to work long shifts with prolonged standing and feels that bending and lifting make her abdomen hurt.   Preterm labor symptoms and general obstetric precautions including but not limited to vaginal bleeding, contractions, leaking of fluid and fetal movement were reviewed in detail with the patient. Please refer to After Visit Summary for other counseling recommendations.  Return in about 2 weeks (around 08/26/2017) for LOB.   Vonzella NippleJulie Merve Hotard, PA-C

## 2017-08-12 NOTE — Patient Instructions (Signed)

## 2017-08-13 ENCOUNTER — Encounter: Payer: Self-pay | Admitting: General Practice

## 2017-08-13 LAB — CBC
Hematocrit: 35.3 % (ref 34.0–46.6)
Hemoglobin: 11.6 g/dL (ref 11.1–15.9)
MCH: 30.1 pg (ref 26.6–33.0)
MCHC: 32.9 g/dL (ref 31.5–35.7)
MCV: 92 fL (ref 79–97)
PLATELETS: 249 10*3/uL (ref 150–379)
RBC: 3.86 x10E6/uL (ref 3.77–5.28)
RDW: 13.8 % (ref 12.3–15.4)
WBC: 12 10*3/uL — AB (ref 3.4–10.8)

## 2017-08-13 LAB — GLUCOSE TOLERANCE, 2 HOURS W/ 1HR
Glucose, 1 hour: 147 mg/dL (ref 65–179)
Glucose, 2 hour: 134 mg/dL (ref 65–152)
Glucose, Fasting: 112 mg/dL — ABNORMAL HIGH (ref 65–91)

## 2017-08-13 LAB — HIV ANTIBODY (ROUTINE TESTING W REFLEX): HIV SCREEN 4TH GENERATION: NONREACTIVE

## 2017-08-13 LAB — RPR: RPR Ser Ql: NONREACTIVE

## 2017-08-20 ENCOUNTER — Encounter: Payer: Self-pay | Admitting: *Deleted

## 2017-08-20 ENCOUNTER — Telehealth: Payer: Self-pay | Admitting: *Deleted

## 2017-08-20 DIAGNOSIS — O24410 Gestational diabetes mellitus in pregnancy, diet controlled: Secondary | ICD-10-CM | POA: Insufficient documentation

## 2017-08-20 DIAGNOSIS — O24419 Gestational diabetes mellitus in pregnancy, unspecified control: Secondary | ICD-10-CM

## 2017-08-20 HISTORY — DX: Gestational diabetes mellitus in pregnancy, unspecified control: O24.419

## 2017-08-20 MED ORDER — ACCU-CHEK GUIDE W/DEVICE KIT
1.0000 [IU] | PACK | Freq: Once | 0 refills | Status: AC
Start: 2017-08-20 — End: 2017-08-20

## 2017-08-20 MED ORDER — GLUCOSE BLOOD VI STRP
ORAL_STRIP | 12 refills | Status: DC
Start: 2017-08-20 — End: 2017-10-23

## 2017-08-20 MED ORDER — ACCU-CHEK FASTCLIX LANCETS MISC: 1.0000 [IU] | Freq: Four times a day (QID) | 12 refills | Status: DC

## 2017-08-20 NOTE — Telephone Encounter (Signed)
I called Maureen Perez and notified her of her 2hrgtt results and that she has GDM and that we will set her up to see a diabetic educator . We discussed there appears to be appointments tomorrow afternoon. I explained I will have registrar call her with appointment, and that I will also send a prescription to her pharmacy for her diabetic supplies that she should bring to the appointment and the educator will show her how to use, diet, etc. She voices understanding.

## 2017-08-20 NOTE — Telephone Encounter (Signed)
-----   Message from Marny LowensteinJulie N Wenzel, PA-C sent at 08/15/2017  8:57 AM EST ----- Patient failed her 2 hour GTT. Please set up with Diabetes education and supplies. Changes next appointment to be with MD and inform patient of appointment and test results.   Thanks,  Raynelle FanningJulie

## 2017-08-21 ENCOUNTER — Ambulatory Visit: Payer: Medicaid Other | Admitting: *Deleted

## 2017-08-21 ENCOUNTER — Ambulatory Visit (INDEPENDENT_AMBULATORY_CARE_PROVIDER_SITE_OTHER): Payer: Medicaid Other | Admitting: Obstetrics and Gynecology

## 2017-08-21 ENCOUNTER — Encounter: Payer: Self-pay | Admitting: Obstetrics and Gynecology

## 2017-08-21 ENCOUNTER — Encounter: Payer: Medicaid Other | Attending: Obstetrics & Gynecology | Admitting: *Deleted

## 2017-08-21 VITALS — BP 120/67 | HR 81 | Wt 160.1 lb

## 2017-08-21 DIAGNOSIS — R7302 Impaired glucose tolerance (oral): Secondary | ICD-10-CM | POA: Insufficient documentation

## 2017-08-21 DIAGNOSIS — Z3480 Encounter for supervision of other normal pregnancy, unspecified trimester: Secondary | ICD-10-CM

## 2017-08-21 DIAGNOSIS — Z713 Dietary counseling and surveillance: Secondary | ICD-10-CM | POA: Diagnosis not present

## 2017-08-21 DIAGNOSIS — O24410 Gestational diabetes mellitus in pregnancy, diet controlled: Secondary | ICD-10-CM

## 2017-08-21 DIAGNOSIS — R7309 Other abnormal glucose: Secondary | ICD-10-CM

## 2017-08-21 NOTE — Progress Notes (Signed)
  Patient was seen on 08/21/2017 for Gestational Diabetes self-management. EDD 10/29/2017. Patient states no history of GDM but her mother has diabetes. Diet history obtained. Patient eats good variety of all food groups and beverages include water, regular soda and sweet tea.  The following learning objectives were met by the patient :   States the definition of Gestational Diabetes  States why dietary management is important in controlling blood glucose  Describes the effects of carbohydrates on blood glucose levels  Demonstrates ability to create a balanced meal plan  Demonstrates carbohydrate counting   States when to check blood glucose levels  Demonstrates proper blood glucose monitoring techniques  States the effect of stress and exercise on blood glucose levels  States the importance of limiting caffeine and abstaining from alcohol and smoking  Plan:  Aim for 3 Carb Choices per meal (45 grams) +/- 1 either way  Aim for 1-2 Carbs per snack Begin reading food labels for Total Carbohydrate of foods Consider  increasing your activity level by walking or other activity daily as tolerated Begin checking BG before breakfast and 2 hours after first bite of breakfast, lunch and dinner as directed by MD  Bring Log Book/Sheet to every medical appointment   Patient was introduced to [employer] and plans to use as record of BG electronically as well as to recording BG on Log Sheet Take medication if directed by MD  Patient states blood glucose monitor Rx called into pharmacy yesterday but she has not picked it up yet.  Accu Check Guide with Fast Clix drums Patient instructed to test pre breakfast and 2 hours each meal as directed by MD  Patient instructed to monitor glucose levels: FBS: 60 - 95 mg/dl 2 hour: <120 mg/dl  Patient received the following handouts:  Nutrition Diabetes and Pregnancy  Carbohydrate Counting List  Patient will be seen for follow-up as needed.

## 2017-08-21 NOTE — Progress Notes (Signed)
   PRENATAL VISIT NOTE  Subjective:  Maureen Perez is a 32 y.o. [id number] at 53w1dbeing seen today for ongoing prenatal care.  She is currently monitored for the following issues for this high-risk pregnancy and has OBESITY; DEPRESSION, MODERATE, RECURRENT; TOBACCO ABUSE; TINNITUS, CHRONIC, BILATERAL; ALLERGIC RHINITIS DUE TO ANIMAL HAIR AND DANDER; INSOMNIA; Tobacco use disorder affecting pregnancy, antepartum; GERD (gastroesophageal reflux disease); Supervision of other normal pregnancy, antepartum; Rampant dental caries; and Gestational diabetes on their problem list.  Patient reports no complaints.  Contractions: Irritability. Vag. Bleeding: None.  Movement: Present. Denies leaking of fluid.   The following portions of the patient's history were reviewed and updated as appropriate: allergies, current medications, past family history, past medical history, past social history, past surgical history and problem list. Problem list updated.  Objective:   Vitals:   08/21/17 1618  BP: 120/67  Pulse: 81  Weight: 160 lb 1.6 oz (72.6 kg)    Fetal Status: Fetal Heart Rate (bpm): 135 Fundal Height: 30 cm Movement: Present     General:  Alert, oriented and cooperative. Patient is in no acute distress.  Skin: Skin is warm and dry. No rash noted.   Cardiovascular: Normal heart rate noted  Respiratory: Normal respiratory effort, no problems with respiration noted  Abdomen: Soft, gravid, appropriate for gestational age.  Pain/Pressure: Present     Pelvic: Cervical exam deferred        Extremities: Normal range of motion.  Edema: None  Mental Status:  Normal mood and affect. Normal behavior. Normal judgment and thought content.   Assessment and Plan:  Pregnancy: [id number] 38w1d1. Supervision of other normal pregnancy, antepartum Patient is doing well without complaints BTL forms signed today  2. Diet controlled gestational diabetes mellitus (GDM), antepartum Patient met with diabetic  educator Reviewed importance of bringing CBG log at every visit to help optimizing euglycemia during pregnancy  Preterm labor symptoms and general obstetric precautions including but not limited to vaginal bleeding, contractions, leaking of fluid and fetal movement were reviewed in detail with the patient. Please refer to After Visit Summary for other counseling recommendations.  No Follow-up on file.   PeMora BellmanMD

## 2017-08-22 ENCOUNTER — Encounter: Payer: Self-pay | Admitting: *Deleted

## 2017-08-22 LAB — POCT URINALYSIS DIP (DEVICE)
Bilirubin Urine: NEGATIVE
GLUCOSE, UA: NEGATIVE mg/dL
KETONES UR: NEGATIVE mg/dL
Nitrite: NEGATIVE
PROTEIN: NEGATIVE mg/dL
SPECIFIC GRAVITY, URINE: 1.01 (ref 1.005–1.030)
UROBILINOGEN UA: 0.2 mg/dL (ref 0.0–1.0)
pH: 6 (ref 5.0–8.0)

## 2017-08-26 ENCOUNTER — Encounter: Payer: Medicaid Other | Admitting: Obstetrics and Gynecology

## 2017-08-29 ENCOUNTER — Encounter (HOSPITAL_COMMUNITY): Payer: Self-pay

## 2017-08-29 ENCOUNTER — Inpatient Hospital Stay (HOSPITAL_COMMUNITY)
Admission: AD | Admit: 2017-08-29 | Discharge: 2017-08-29 | Disposition: A | Discharge status: Home / Self Care | Payer: Medicaid Other | Source: Ambulatory Visit | Attending: Obstetrics & Gynecology | Admitting: Obstetrics & Gynecology

## 2017-08-29 DIAGNOSIS — M549 Dorsalgia, unspecified: Secondary | ICD-10-CM

## 2017-08-29 DIAGNOSIS — Z9049 Acquired absence of other specified parts of digestive tract: Secondary | ICD-10-CM | POA: Insufficient documentation

## 2017-08-29 DIAGNOSIS — O99891 Other specified diseases and conditions complicating pregnancy: Secondary | ICD-10-CM

## 2017-08-29 DIAGNOSIS — Z87440 Personal history of urinary (tract) infections: Secondary | ICD-10-CM | POA: Diagnosis not present

## 2017-08-29 DIAGNOSIS — Z833 Family history of diabetes mellitus: Secondary | ICD-10-CM | POA: Diagnosis not present

## 2017-08-29 DIAGNOSIS — Z881 Allergy status to other antibiotic agents status: Secondary | ICD-10-CM | POA: Insufficient documentation

## 2017-08-29 DIAGNOSIS — Z841 Family history of disorders of kidney and ureter: Secondary | ICD-10-CM | POA: Insufficient documentation

## 2017-08-29 DIAGNOSIS — O99213 Obesity complicating pregnancy, third trimester: Secondary | ICD-10-CM | POA: Insufficient documentation

## 2017-08-29 DIAGNOSIS — Z3A31 31 weeks gestation of pregnancy: Secondary | ICD-10-CM

## 2017-08-29 DIAGNOSIS — Z8249 Family history of ischemic heart disease and other diseases of the circulatory system: Secondary | ICD-10-CM | POA: Insufficient documentation

## 2017-08-29 DIAGNOSIS — O9989 Other specified diseases and conditions complicating pregnancy, childbirth and the puerperium: Secondary | ICD-10-CM

## 2017-08-29 DIAGNOSIS — Z9889 Other specified postprocedural states: Secondary | ICD-10-CM | POA: Diagnosis not present

## 2017-08-29 DIAGNOSIS — Z885 Allergy status to narcotic agent status: Secondary | ICD-10-CM | POA: Insufficient documentation

## 2017-08-29 DIAGNOSIS — O24410 Gestational diabetes mellitus in pregnancy, diet controlled: Secondary | ICD-10-CM | POA: Diagnosis not present

## 2017-08-29 DIAGNOSIS — O26893 Other specified pregnancy related conditions, third trimester: Secondary | ICD-10-CM | POA: Diagnosis not present

## 2017-08-29 DIAGNOSIS — F17210 Nicotine dependence, cigarettes, uncomplicated: Secondary | ICD-10-CM | POA: Diagnosis not present

## 2017-08-29 DIAGNOSIS — O99333 Smoking (tobacco) complicating pregnancy, third trimester: Secondary | ICD-10-CM | POA: Insufficient documentation

## 2017-08-29 LAB — URINALYSIS, ROUTINE W REFLEX MICROSCOPIC
BILIRUBIN URINE: NEGATIVE
Glucose, UA: NEGATIVE mg/dL
Hgb urine dipstick: NEGATIVE
Ketones, ur: NEGATIVE mg/dL
Nitrite: NEGATIVE
PROTEIN: NEGATIVE mg/dL
SPECIFIC GRAVITY, URINE: 1.01 (ref 1.005–1.030)
pH: 7 (ref 5.0–8.0)

## 2017-08-29 LAB — CBC WITH DIFFERENTIAL/PLATELET
BASOS ABS: 0 10*3/uL (ref 0.0–0.1)
BASOS PCT: 0 %
EOS ABS: 0.1 10*3/uL (ref 0.0–0.7)
Eosinophils Relative: 1 %
HCT: 31.1 % — ABNORMAL LOW (ref 36.0–46.0)
Hemoglobin: 10.3 g/dL — ABNORMAL LOW (ref 12.0–15.0)
LYMPHS ABS: 2.1 10*3/uL (ref 0.7–4.0)
Lymphocytes Relative: 20 %
MCH: 29.7 pg (ref 26.0–34.0)
MCHC: 33.1 g/dL (ref 30.0–36.0)
MCV: 89.6 fL (ref 78.0–100.0)
Monocytes Absolute: 0.3 10*3/uL (ref 0.1–1.0)
Monocytes Relative: 3 %
NEUTROS PCT: 76 %
Neutro Abs: 7.9 10*3/uL — ABNORMAL HIGH (ref 1.7–7.7)
Platelets: 223 10*3/uL (ref 150–400)
RBC: 3.47 MIL/uL — AB (ref 3.87–5.11)
RDW: 14.3 % (ref 11.5–15.5)
WBC: 10.4 10*3/uL (ref 4.0–10.5)

## 2017-08-29 NOTE — MAU Provider Note (Signed)
History     CSN: [id number]  Arrival date and time: 08/29/17 1014   First Provider Initiated Contact with Patient 08/29/17 1108      Chief Complaint  Patient presents with  . Back Pain   HPI Maureen Perez is 32 y.o. [id number] [redacted]w[redacted]d weeks presenting with back pain that began 3 days ago.  She denies fever, chills, abdominal pain, pelvic pain and vaginal bleeding.  She state she has occasional  uterine contractions, denies contractions at this time.  She is a patient in the clinic, last seen by Dr. Jolayne Panther on  3/7.  She has hx of diet controlled gestational diabetes, depression, tobacco abuse, obesity.     Past Medical History:  Diagnosis Date  . Abnormal Pap smear    repeat WNL  . Back pain   . Cholelithiasis   . Depression    no meds since pregancny  . Family history of adverse reaction to anesthesia    " MOTHER TAKES ALONG TIME TO WAKE "  . Gestational diabetes 08/20/2017  . Infection    UTI  . Injury of tendon of left rotator cuff   . Obesity   . PONV (postoperative nausea and vomiting)     Past Surgical History:  Procedure Laterality Date  . CHOLECYSTECTOMY N/A 12/05/2014   Procedure: LAPAROSCOPIC CHOLECYSTECTOMY WITH INTRAOPERATIVE CHOLANGIOGRAM;  Surgeon: Chevis Pretty III, MD;  Location: [facility] OR;  Service: General;  Laterality: N/A;  . TONSILLECTOMY     age 74  . tubes in ears  age 19    Family History  Problem Relation Age of Onset  . Depression Mother   . Diabetes Mother   . Hypertension Mother   . COPD Mother   . Anesthesia problems Mother   . Depression Sister   . Diabetes Sister   . Kidney disease Sister   . Hypertension Sister   . Hypertension Father   . Anesthesia problems Father   . COPD Father     Social History   Tobacco Use  . Smoking status: Current Every Day Smoker    Packs/day: 0.50    Years: 10.00    Pack years: 5.00    Types: Cigarettes  . Smokeless tobacco: Never Used  Substance Use Topics  . Alcohol use: No    Alcohol/week: 0.0 oz   . Drug use: No    Allergies:  Allergies  Allergen Reactions  . Cephalexin Itching and Nausea And Vomiting  . Percocet [Oxycodone-Acetaminophen] Nausea And Vomiting    Medications Prior to Admission  Medication Sig Dispense Refill Last Dose  . acetaminophen (TYLENOL) 325 MG tablet Take 650 mg by mouth every 6 (six) hours as needed for mild pain or headache.   Past Week at Unknown time  . Prenatal Multivit-Min-Fe-FA (PRENATAL VITAMINS PO) Take 1 tablet by mouth daily.   08/29/2017 at Unknown time  . ACCU-CHEK FASTCLIX LANCETS MISC 1 Units by Percutaneous route 4 (four) times daily. 100 each 12   . Elastic Bandages & Supports (COMFORT FIT MATERNITY SUPP MED) MISC 1 Device by Does not apply route daily. (Patient not taking: Reported on 08/12/2017) 1 each 0 Not Taking  . glucose blood test strip Use as instructed- check blood glucose four times per day. 100 each 12     Review of Systems  Constitutional: Negative for activity change, appetite change, chills and fever.  Gastrointestinal: Negative for abdominal pain, nausea and vomiting.  Genitourinary: Negative for dysuria, frequency, hematuria and vaginal bleeding.  Musculoskeletal: Positive for  back pain.   Physical Exam   Blood pressure (!) 109/57, pulse 85, temperature 98.1 F (36.7 C), temperature source Oral, resp. rate 16, last menstrual period 01/22/2017, SpO2 99 %.  Physical Exam  Nursing note and vitals reviewed. Constitutional: She is oriented to person, place, and time. She appears well-developed and well-nourished. No distress.  HENT:  Head: Normocephalic.  Neck: Normal range of motion.  Cardiovascular: Normal rate.  Respiratory: Effort normal.  GI: Soft. She exhibits no distension and no mass. There is no tenderness. There is no rebound and no guarding.  Genitourinary: There is no rash, tenderness or lesion on the right labia. There is no rash, tenderness or lesion on the left labia. Uterus is enlarged. Uterus is not  tender. Cervix exhibits no motion tenderness, no discharge and no friability. There is erythema in the vagina. No tenderness or bleeding in the vagina. No vaginal discharge found.  Genitourinary Comments: Cervical is closed.  Neurological: She is alert and oriented to person, place, and time.  Skin: Skin is warm and dry.  Psychiatric: She has a normal mood and affect. Her behavior is normal. Thought content normal.   Results for orders placed or performed during the hospital encounter of 08/29/17 (from the past 24 hour(s))  Urinalysis, Routine w reflex microscopic     Status: Abnormal   Collection Time: 08/29/17 10:17 AM  Result Value Ref Range   Color, Urine YELLOW YELLOW   APPearance HAZY (A) CLEAR   Specific Gravity, Urine 1.010 1.005 - 1.030   pH 7.0 5.0 - 8.0   Glucose, UA NEGATIVE NEGATIVE mg/dL   Hgb urine dipstick NEGATIVE NEGATIVE   Bilirubin Urine NEGATIVE NEGATIVE   Ketones, ur NEGATIVE NEGATIVE mg/dL   Protein, ur NEGATIVE NEGATIVE mg/dL   Nitrite NEGATIVE NEGATIVE   Leukocytes, UA TRACE (A) NEGATIVE   RBC / HPF 0-5 0 - 5 RBC/hpf   WBC, UA 0-5 0 - 5 WBC/hpf   Bacteria, UA RARE (A) NONE SEEN   Squamous Epithelial / LPF 6-30 (A) NONE SEEN   Mucus PRESENT   CBC with Differential/Platelet     Status: Abnormal   Collection Time: 08/29/17 11:32 AM  Result Value Ref Range   WBC 10.4 4.0 - 10.5 K/uL   RBC 3.47 (L) 3.87 - 5.11 MIL/uL   Hemoglobin 10.3 (L) 12.0 - 15.0 g/dL   HCT 16.131.1 (L) 09.636.0 - 04.546.0 %   MCV 89.6 78.0 - 100.0 fL   MCH 29.7 26.0 - 34.0 pg   MCHC 33.1 30.0 - 36.0 g/dL   RDW 40.914.3 81.111.5 - 91.415.5 %   Platelets 223 150 - 400 K/uL   Neutrophils Relative % 76 %   Neutro Abs 7.9 (H) 1.7 - 7.7 K/uL   Lymphocytes Relative 20 %   Lymphs Abs 2.1 0.7 - 4.0 K/uL   Monocytes Relative 3 %   Monocytes Absolute 0.3 0.1 - 1.0 K/uL   Eosinophils Relative 1 %   Eosinophils Absolute 0.1 0.0 - 0.7 K/uL   Basophils Relative 0 %   Basophils Absolute 0.0 0.0 - 0.1 K/uL    MAU  Course  Procedures  MDM MSE Exam Labs NST baseline of 145-150         Mod variability         1 variable  Noted         Neg for contractions Discussed with patient that she is not in labor and she does not have a bladder infection.  Assessment and Plan  A:  Back pain      Viable pregnancy at [redacted]w[redacted]d gestation      Gestattional Diabetes diet controlled  P:  Continue CBG log as directed by Dr. Jolayne Panther       Keep scheduled appt for 3/21      Stay well hydrated      Report any change in sxs      May use Tylenol for back pain.      Instructed her to consider pregnancy belt if pain continues   Dennison Mascot Derrel Moore 08/29/2017, 12:32 PM

## 2017-08-29 NOTE — Discharge Instructions (Signed)

## 2017-08-29 NOTE — MAU Note (Signed)
Pt has had back pain x 3 days 8/10. States she's also GDM that is not well controlled at the moment.

## 2017-09-04 ENCOUNTER — Encounter: Payer: Medicaid Other | Admitting: Family Medicine

## 2017-09-09 ENCOUNTER — Encounter: Payer: Medicaid Other | Admitting: Medical

## 2017-09-14 ENCOUNTER — Encounter (HOSPITAL_COMMUNITY): Payer: Self-pay

## 2017-09-14 ENCOUNTER — Other Ambulatory Visit: Payer: Self-pay

## 2017-09-14 ENCOUNTER — Inpatient Hospital Stay (HOSPITAL_COMMUNITY)
Admission: AD | Admit: 2017-09-14 | Discharge: 2017-09-14 | Disposition: A | Discharge status: Home / Self Care | Payer: Medicaid Other | Source: Ambulatory Visit | Attending: Obstetrics and Gynecology | Admitting: Obstetrics and Gynecology

## 2017-09-14 DIAGNOSIS — F17210 Nicotine dependence, cigarettes, uncomplicated: Secondary | ICD-10-CM | POA: Diagnosis not present

## 2017-09-14 DIAGNOSIS — Z885 Allergy status to narcotic agent status: Secondary | ICD-10-CM | POA: Diagnosis not present

## 2017-09-14 DIAGNOSIS — Z881 Allergy status to other antibiotic agents status: Secondary | ICD-10-CM | POA: Diagnosis not present

## 2017-09-14 DIAGNOSIS — O99333 Smoking (tobacco) complicating pregnancy, third trimester: Secondary | ICD-10-CM | POA: Insufficient documentation

## 2017-09-14 DIAGNOSIS — Z3A33 33 weeks gestation of pregnancy: Secondary | ICD-10-CM | POA: Insufficient documentation

## 2017-09-14 DIAGNOSIS — R102 Pelvic and perineal pain: Secondary | ICD-10-CM | POA: Diagnosis not present

## 2017-09-14 DIAGNOSIS — O26893 Other specified pregnancy related conditions, third trimester: Secondary | ICD-10-CM | POA: Diagnosis not present

## 2017-09-14 DIAGNOSIS — O368131 Decreased fetal movements, third trimester, fetus 1: Secondary | ICD-10-CM

## 2017-09-14 DIAGNOSIS — Z3689 Encounter for other specified antenatal screening: Secondary | ICD-10-CM

## 2017-09-14 HISTORY — DX: Unspecified abnormal cytological findings in specimens from vagina: R87.629

## 2017-09-14 HISTORY — DX: Unspecified hearing loss, unspecified ear: H91.90

## 2017-09-14 LAB — URINALYSIS, ROUTINE W REFLEX MICROSCOPIC
Bilirubin Urine: NEGATIVE
GLUCOSE, UA: NEGATIVE mg/dL
Hgb urine dipstick: NEGATIVE
Ketones, ur: NEGATIVE mg/dL
LEUKOCYTES UA: NEGATIVE
Nitrite: NEGATIVE
PROTEIN: NEGATIVE mg/dL
Specific Gravity, Urine: 1.004 — ABNORMAL LOW (ref 1.005–1.030)
pH: 6 (ref 5.0–8.0)

## 2017-09-14 NOTE — MAU Provider Note (Signed)
History     CSN: [id number]  Arrival date and time: 09/14/17 4034   First Provider Initiated Contact with Patient 09/14/17 1906      Chief Complaint  Patient presents with  . Pelvic Pain   HPI Maureen Perez 32 y.o. [redacted]w[redacted]d  Comes to MAU with shooting pains in her vagina today and is not feeling the baby move.  She has been feeling her abdomen "get tight" in the past couple of days and is having increased back pain.     OB History    Gravida  5   Para  2   Term  2   Preterm  0   AB  2   Living  2     SAB  2   TAB  0   Ectopic  0   Multiple  0   Live Births  2           Past Medical History:  Diagnosis Date  . Abnormal Pap smear    repeat WNL  . Back pain   . Cholelithiasis   . Depression    no meds since pregancny  . Family history of adverse reaction to anesthesia    " MOTHER TAKES ALONG TIME TO WAKE "  . Gestational diabetes 08/20/2017   gestational - diet controlled  . Hearing loss    as a child - tubes in ear  . Infection    UTI  . Injury of tendon of left rotator cuff   . Obesity   . PONV (postoperative nausea and vomiting)   . Vaginal Pap smear, abnormal     Past Surgical History:  Procedure Laterality Date  . CHOLECYSTECTOMY N/A 12/05/2014   Procedure: LAPAROSCOPIC CHOLECYSTECTOMY WITH INTRAOPERATIVE CHOLANGIOGRAM;  Surgeon: Chevis Pretty III, MD;  Location: [facility] OR;  Service: General;  Laterality: N/A;  . TONSILLECTOMY     age 57  . tubes in ears  age 26    Family History  Problem Relation Age of Onset  . Depression Mother   . Diabetes Mother   . Hypertension Mother   . COPD Mother   . Anesthesia problems Mother   . Depression Sister   . Diabetes Sister   . Kidney disease Sister   . Hypertension Sister   . Hypertension Father   . Anesthesia problems Father   . COPD Father     Social History   Tobacco Use  . Smoking status: Current Every Day Smoker    Packs/day: 0.50    Years: 10.00    Pack years: 5.00    Types:  Cigarettes  . Smokeless tobacco: Never Used  Substance Use Topics  . Alcohol use: No    Alcohol/week: 0.0 oz  . Drug use: No    Allergies:  Allergies  Allergen Reactions  . Cephalexin Itching and Nausea And Vomiting  . Percocet [Oxycodone-Acetaminophen] Nausea And Vomiting   Patient Active Problem List   Diagnosis Date Noted  . Gestational diabetes 08/20/2017  . Supervision of other normal pregnancy, antepartum 04/10/2017  . Rampant dental caries 04/10/2017  . GERD (gastroesophageal reflux disease) 06/15/2013  . Tobacco use disorder affecting pregnancy, antepartum 01/12/2013  . DEPRESSION, MODERATE, RECURRENT 03/20/2010  . 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 INSOMNIA 01/06/2009  . TOBACCO ABUSE 11/22/2008  . ALLERGIC RHINITIS DUE TO ANIMAL HAIR AND DANDER 11/22/2008  . OBESITY 10/20/2008    Medications Prior to Admission  Medication Sig Dispense Refill Last Dose  . ACCU-CHEK FASTCLIX LANCETS MISC  1 Units by Percutaneous route 4 (four) times daily. 100 each 12   . acetaminophen (TYLENOL) 325 MG tablet Take 650 mg by mouth every 6 (six) hours as needed for mild pain or headache.   Past Week at Unknown time  . Elastic Bandages & Supports (COMFORT FIT MATERNITY SUPP MED) MISC 1 Device by Does not apply route daily. (Patient not taking: Reported on 08/12/2017) 1 each 0 Not Taking  . glucose blood test strip Use as instructed- check blood glucose four times per day. 100 each 12   . Prenatal Multivit-Min-Fe-FA (PRENATAL VITAMINS PO) Take 1 tablet by mouth daily.   08/29/2017 at Unknown time    Review of Systems  Constitutional: Negative for fever.  Gastrointestinal: Negative for abdominal pain, nausea and vomiting.  Genitourinary: Positive for pelvic pain. Negative for dysuria, vaginal bleeding and vaginal discharge.   Physical Exam   Blood pressure 112/65, pulse 72, temperature 98 F (36.7 C), temperature source Oral, resp. rate 16, height 5\' 1"  (1.549 m), weight 167 lb 4  oz (75.9 kg), last menstrual period 01/22/2017, SpO2 100 %.  Physical Exam  Nursing note and vitals reviewed. Constitutional: She is oriented to person, place, and time. She appears well-developed and well-nourished.  HENT:  Head: Normocephalic.  Eyes: EOM are normal.  Neck: Neck supple.  GI: Soft. There is no tenderness. There is no rebound and no guarding.  FHT baseline is 140 with moderate variability and 15x15 accels noted - reactive strip.  No contractions, no decelerations.  Genitourinary:  Genitourinary Comments: Cervical exam - 1 cm, thick, vertex, -3  Musculoskeletal: Normal range of motion.  Neurological: She is alert and oriented to person, place, and time.  Skin: Skin is warm and dry.  Psychiatric: She has a normal mood and affect.    MAU Course  Procedures Results for orders placed or performed during the hospital encounter of 09/14/17 (from the past 24 hour(s))  Urinalysis, Routine w reflex microscopic     Status: Abnormal   Collection Time: 09/14/17  6:40 PM  Result Value Ref Range   Color, Urine STRAW (A) YELLOW   APPearance CLEAR CLEAR   Specific Gravity, Urine 1.004 (L) 1.005 - 1.030   pH 6.0 5.0 - 8.0   Glucose, UA NEGATIVE NEGATIVE mg/dL   Hgb urine dipstick NEGATIVE NEGATIVE   Bilirubin Urine NEGATIVE NEGATIVE   Ketones, ur NEGATIVE NEGATIVE mg/dL   Protein, ur NEGATIVE NEGATIVE mg/dL   Nitrite NEGATIVE NEGATIVE   Leukocytes, UA NEGATIVE NEGATIVE    MDM Client is now feeling the baby move and the NST is reactive.  Client does smell like smoke and admits to smoking today.  Advised not to smoke.  Cervical exam is normal for her third baby.  Discussed that shooting pains in the vaginal are normal and it is very likely that the abdominal support band would be helpful too.   Assessment and Plan  Reactive NST Pelvic pain that is likely normal as cervix is 1 cm and thick  Plan Get the belly support band discussed at your last visit.   Continue to check  your blood sugars as instructed. Drink at least 8 8-oz glasses of water every day. Stop smoking if possible. Return if you have contractions, vaginal bleeding or leaking of fluid. Keep your appointments in the clinic.  Currie Pariserri L Donalyn Schneeberger 09/14/2017, 7:36 PM

## 2017-09-14 NOTE — Discharge Instructions (Signed)
Drink at least 8 8-oz glasses of water every day. Stop smoking if possible. Check your blood sugar as you have been instructed. Return if you have contractions, vaginal bleeding or leaking of fluid. Keep your appointments in the clinic.

## 2017-09-14 NOTE — MAU Note (Signed)
Pt states she feels sharp vaginal pain when fetus moves x 1 week; also reports she has been feeling her stomach "get tight" x 2 days accompanied with back pain.  Denies UTI symptoms.  States intercourse last night.  Denies + vag bleeding.  Has not noticed fetal movement due to abdm/back pain.

## 2017-09-16 ENCOUNTER — Encounter (HOSPITAL_COMMUNITY): Payer: Self-pay

## 2017-09-16 ENCOUNTER — Inpatient Hospital Stay (HOSPITAL_COMMUNITY)
Admission: AD | Admit: 2017-09-16 | Discharge: 2017-09-16 | Disposition: A | Discharge status: Home / Self Care | Payer: Medicaid Other | Source: Ambulatory Visit | Attending: Obstetrics and Gynecology | Admitting: Obstetrics and Gynecology

## 2017-09-16 ENCOUNTER — Ambulatory Visit: Payer: Medicaid Other | Admitting: Registered"

## 2017-09-16 DIAGNOSIS — Z3A33 33 weeks gestation of pregnancy: Secondary | ICD-10-CM | POA: Diagnosis not present

## 2017-09-16 DIAGNOSIS — O26893 Other specified pregnancy related conditions, third trimester: Secondary | ICD-10-CM

## 2017-09-16 DIAGNOSIS — F17210 Nicotine dependence, cigarettes, uncomplicated: Secondary | ICD-10-CM | POA: Diagnosis not present

## 2017-09-16 DIAGNOSIS — Z3689 Encounter for other specified antenatal screening: Secondary | ICD-10-CM

## 2017-09-16 DIAGNOSIS — N898 Other specified noninflammatory disorders of vagina: Secondary | ICD-10-CM

## 2017-09-16 DIAGNOSIS — O99333 Smoking (tobacco) complicating pregnancy, third trimester: Secondary | ICD-10-CM | POA: Insufficient documentation

## 2017-09-16 LAB — URINALYSIS, ROUTINE W REFLEX MICROSCOPIC
Bilirubin Urine: NEGATIVE
GLUCOSE, UA: NEGATIVE mg/dL
HGB URINE DIPSTICK: NEGATIVE
Ketones, ur: NEGATIVE mg/dL
NITRITE: NEGATIVE
PH: 6 (ref 5.0–8.0)
Protein, ur: NEGATIVE mg/dL
SPECIFIC GRAVITY, URINE: 1.01 (ref 1.005–1.030)

## 2017-09-16 LAB — WET PREP, GENITAL
CLUE CELLS WET PREP: NONE SEEN
SPERM: NONE SEEN
Trich, Wet Prep: NONE SEEN
Yeast Wet Prep HPF POC: NONE SEEN

## 2017-09-16 LAB — AMNISURE RUPTURE OF MEMBRANE (ROM) NOT AT ARMC: Amnisure ROM: NEGATIVE

## 2017-09-16 NOTE — MAU Note (Signed)
Woke up this AM and pants were wet. Clear fluid. Unsure if still leaking. No pain, vaginal bleeding. +FM

## 2017-09-16 NOTE — Discharge Instructions (Signed)
Braxton Hicks Contractions °Contractions of the uterus can occur throughout pregnancy, but they are not always a sign that you are in labor. You may have practice contractions called Braxton Hicks contractions. These false labor contractions are sometimes confused with true labor. °What are Braxton Hicks contractions? °Braxton Hicks contractions are tightening movements that occur in the muscles of the uterus before labor. Unlike true labor contractions, these contractions do not result in opening (dilation) and thinning of the cervix. Toward the end of pregnancy (32-34 weeks), Braxton Hicks contractions can happen more often and may become stronger. These contractions are sometimes difficult to tell apart from true labor because they can be very uncomfortable. You should not feel embarrassed if you go to the hospital with false labor. °Sometimes, the only way to tell if you are in true labor is for your health care provider to look for changes in the cervix. The health care provider will do a physical exam and may monitor your contractions. If you are not in true labor, the exam should show that your cervix is not dilating and your water has not broken. °If there are other health problems associated with your pregnancy, it is completely safe for you to be sent home with false labor. You may continue to have Braxton Hicks contractions until you go into true labor. °How to tell the difference between true labor and false labor °True labor °· Contractions last 30-70 seconds. °· Contractions become very regular. °· Discomfort is usually felt in the top of the uterus, and it spreads to the lower abdomen and low back. °· Contractions do not go away with walking. °· Contractions usually become more intense and increase in frequency. °· The cervix dilates and gets thinner. °False labor °· Contractions are usually shorter and not as strong as true labor contractions. °· Contractions are usually irregular. °· Contractions  are often felt in the front of the lower abdomen and in the groin. °· Contractions may go away when you walk around or change positions while lying down. °· Contractions get weaker and are shorter-lasting as time goes on. °· The cervix usually does not dilate or become thin. °Follow these instructions at home: °· Take over-the-counter and prescription medicines only as told by your health care provider. °· Keep up with your usual exercises and follow other instructions from your health care provider. °· Eat and drink lightly if you think you are going into labor. °· If Braxton Hicks contractions are making you uncomfortable: °? Change your position from lying down or resting to walking, or change from walking to resting. °? Sit and rest in a tub of warm water. °? Drink enough fluid to keep your urine pale yellow. Dehydration may cause these contractions. °? Do slow and deep breathing several times an hour. °· Keep all follow-up prenatal visits as told by your health care provider. This is important. °Contact a health care provider if: °· You have a fever. °· You have continuous pain in your abdomen. °Get help right away if: °· Your contractions become stronger, more regular, and closer together. °· You have fluid leaking or gushing from your vagina. °· You pass blood-tinged mucus (bloody show). °· You have bleeding from your vagina. °· You have low back pain that you never had before. °· You feel your baby’s head pushing down and causing pelvic pressure. °· Your baby is not moving inside you as much as it used to. °Summary °· Contractions that occur before labor are called Braxton   Hicks contractions, false labor, or practice contractions. °· Braxton Hicks contractions are usually shorter, weaker, farther apart, and less regular than true labor contractions. True labor contractions usually become progressively stronger and regular and they become more frequent. °· Manage discomfort from Braxton Hicks contractions by  changing position, resting in a warm bath, drinking plenty of water, or practicing deep breathing. °This information is not intended to replace advice given to you by your health care provider. Make sure you discuss any questions you have with your health care provider. °Document Released: 10/17/2016 Document Revised: 10/17/2016 Document Reviewed: 10/17/2016 °[employer] Interactive Patient Education © 2018 [employer] ° °

## 2017-09-16 NOTE — MAU Provider Note (Signed)
History     CSN: [id number]  Arrival date and time: 09/16/17 1034   First Provider Initiated Contact with Patient 09/16/17 1114      Chief Complaint  Patient presents with  . Rupture of Membranes   [id number] @33 .6 wks here with LOF. States she woke up around 6am and her pants were wet. She thinks the fluid was clear and is certain it wasn't urine. Had small amt of leaking since. No recent IC. No VB or ctx. Reports good FM. No vaginal discharge, itching, or odor prior to this am.  OB History    Gravida  5   Para  2   Term  2   Preterm  0   AB  2   Living  2     SAB  2   TAB  0   Ectopic  0   Multiple  0   Live Births  2           Past Medical History:  Diagnosis Date  . Abnormal Pap smear    repeat WNL  . Back pain   . Cholelithiasis   . Depression    no meds since pregancny  . Family history of adverse reaction to anesthesia    " MOTHER TAKES ALONG TIME TO WAKE "  . Gestational diabetes 08/20/2017   gestational - diet controlled  . Hearing loss    as a child - tubes in ear  . Infection    UTI  . Injury of tendon of left rotator cuff   . Obesity   . PONV (postoperative nausea and vomiting)   . Vaginal Pap smear, abnormal     Past Surgical History:  Procedure Laterality Date  . CHOLECYSTECTOMY N/A 12/05/2014   Procedure: LAPAROSCOPIC CHOLECYSTECTOMY WITH INTRAOPERATIVE CHOLANGIOGRAM;  Surgeon: Chevis Pretty III, MD;  Location: [facility] OR;  Service: General;  Laterality: N/A;  . TONSILLECTOMY     age 52  . tubes in ears  age 44    Family History  Problem Relation Age of Onset  . Depression Mother   . Diabetes Mother   . Hypertension Mother   . COPD Mother   . Anesthesia problems Mother   . Depression Sister   . Diabetes Sister   . Kidney disease Sister   . Hypertension Sister   . Hypertension Father   . Anesthesia problems Father   . COPD Father     Social History   Tobacco Use  . Smoking status: Current Every Day Smoker    Packs/day: 0.50     Years: 10.00    Pack years: 5.00    Types: Cigarettes  . Smokeless tobacco: Never Used  Substance Use Topics  . Alcohol use: No    Alcohol/week: 0.0 oz  . Drug use: No    Allergies:  Allergies  Allergen Reactions  . Cephalexin Itching and Nausea And Vomiting  . Percocet [Oxycodone-Acetaminophen] Nausea And Vomiting    Medications Prior to Admission  Medication Sig Dispense Refill Last Dose  . acetaminophen (TYLENOL) 325 MG tablet Take 650 mg by mouth every 6 (six) hours as needed for mild pain or headache.   Past Week at Unknown time  . Prenatal Multivit-Min-Fe-FA (PRENATAL VITAMINS PO) Take 1 tablet by mouth daily.   09/16/2017 at Unknown time  . ACCU-CHEK FASTCLIX LANCETS MISC 1 Units by Percutaneous route 4 (four) times daily. 100 each 12   . Elastic Bandages & Supports (COMFORT FIT MATERNITY SUPP MED) MISC 1 Device  by Does not apply route daily. (Patient not taking: Reported on 08/12/2017) 1 each 0 Not Taking  . glucose blood test strip Use as instructed- check blood glucose four times per day. 100 each 12     Review of Systems  Gastrointestinal: Negative for abdominal pain.  Genitourinary: Positive for vaginal discharge.   Physical Exam   Pulse 83, temperature 97.7 F (36.5 C), temperature source Oral, resp. rate 18, height 5\' 1"  (1.549 m), weight 167 lb (75.8 kg), last menstrual period 01/22/2017.  Physical Exam  Nursing note and vitals reviewed. Constitutional: She is oriented to person, place, and time. She appears well-developed and well-nourished. No distress.  HENT:  Head: Normocephalic and atraumatic.  Neck: Normal range of motion.  Cardiovascular: Normal rate.  Respiratory: Effort normal. No respiratory distress.  GI: Soft. She exhibits no distension. There is no tenderness.  gravid  Genitourinary:  Genitourinary Comments: SSE: +white clumpy discharge, no pool, fern neg SVE closed/thick  Musculoskeletal: Normal range of motion.  Neurological: She is  alert and oriented to person, place, and time.  Skin: Skin is warm and dry.  Psychiatric: She has a normal mood and affect.  EFM: 150 bpm, mod variability, + accels, no decels Toco: none  Results for orders placed or performed during the hospital encounter of 09/16/17 (from the past 24 hour(s))  Urinalysis, Routine w reflex microscopic     Status: Abnormal   Collection Time: 09/16/17 11:26 AM  Result Value Ref Range   Color, Urine YELLOW YELLOW   APPearance CLOUDY (A) CLEAR   Specific Gravity, Urine 1.010 1.005 - 1.030   pH 6.0 5.0 - 8.0   Glucose, UA NEGATIVE NEGATIVE mg/dL   Hgb urine dipstick NEGATIVE NEGATIVE   Bilirubin Urine NEGATIVE NEGATIVE   Ketones, ur NEGATIVE NEGATIVE mg/dL   Protein, ur NEGATIVE NEGATIVE mg/dL   Nitrite NEGATIVE NEGATIVE   Leukocytes, UA MODERATE (A) NEGATIVE   RBC / HPF 6-30 0 - 5 RBC/hpf   WBC, UA 6-30 0 - 5 WBC/hpf   Bacteria, UA FEW (A) NONE SEEN   Squamous Epithelial / LPF TOO NUMEROUS TO COUNT (A) NONE SEEN   Mucus PRESENT   Wet prep, genital     Status: Abnormal   Collection Time: 09/16/17 11:30 AM  Result Value Ref Range   Yeast Wet Prep HPF POC NONE SEEN NONE SEEN   Trich, Wet Prep NONE SEEN NONE SEEN   Clue Cells Wet Prep HPF POC NONE SEEN NONE SEEN   WBC, Wet Prep HPF POC MANY (A) NONE SEEN   Sperm NONE SEEN   Amnisure rupture of membrane (rom)not at [facility]     Status: None   Collection Time: 09/16/17 11:30 AM  Result Value Ref Range   Amnisure ROM NEGATIVE    MAU Course  Procedures  MDM No evidence of SROM or PTL. May have mild yeast infection but not symptomatic today. If develops sx can use OTC Monistat 7 day. NST reactive initially then 1 isolated variable and again reactive after, fetal status reassuring. Stable for discharge home.  Assessment and Plan   1. [redacted] weeks gestation of pregnancy   2. NST (non-stress test) reactive   3. Vaginal discharge during pregnancy in third trimester    Discharge home Follow up in OB office  this week as scheduled PTL precautions  Allergies as of 09/16/2017      Reactions   Cephalexin Itching, Nausea And Vomiting   Percocet [oxycodone-acetaminophen] Nausea And Vomiting  Medication List    TAKE these medications   ACCU-CHEK FASTCLIX LANCETS Misc 1 Units by Percutaneous route 4 (four) times daily.   acetaminophen 325 MG tablet Commonly known as:  TYLENOL Take 650 mg by mouth every 6 (six) hours as needed for mild pain or headache.   COMFORT FIT MATERNITY SUPP MED Misc 1 Device by Does not apply route daily.   glucose blood test strip Use as instructed- check blood glucose four times per day.   PRENATAL VITAMINS PO Take 1 tablet by mouth daily.      Donette LarryMelanie Sharalyn Lomba, CNM 09/16/2017, 12:12 PM

## 2017-09-18 ENCOUNTER — Encounter: Payer: Self-pay | Admitting: Obstetrics and Gynecology

## 2017-09-18 ENCOUNTER — Ambulatory Visit (INDEPENDENT_AMBULATORY_CARE_PROVIDER_SITE_OTHER): Payer: Medicaid Other | Admitting: Obstetrics and Gynecology

## 2017-09-18 VITALS — BP 114/58 | HR 86 | Wt 165.5 lb

## 2017-09-18 DIAGNOSIS — O24410 Gestational diabetes mellitus in pregnancy, diet controlled: Secondary | ICD-10-CM

## 2017-09-18 DIAGNOSIS — O0993 Supervision of high risk pregnancy, unspecified, third trimester: Secondary | ICD-10-CM

## 2017-09-18 NOTE — Progress Notes (Signed)
Prenatal Visit Note Date: 09/18/2017 Clinic: [facility]  Subjective:  Carlyle LipaMelissa D Klemmer is a 32 y.o. [id number] at 3652w1d being seen today for ongoing prenatal care.  She is currently monitored for the following issues for this high-risk pregnancy and has OBESITY; DEPRESSION, MODERATE, RECURRENT; TOBACCO ABUSE; TINNITUS, CHRONIC, BILATERAL; ALLERGIC RHINITIS DUE TO ANIMAL HAIR AND DANDER; INSOMNIA; Tobacco use disorder affecting pregnancy, antepartum; GERD (gastroesophageal reflux disease); Supervision of high risk pregnancy, antepartum, third trimester; Rampant dental caries; and GDM, class A1 on their problem list.  Patient reports no complaints.   Contractions: Irritability. Vag. Bleeding: None.  Movement: Present. Denies leaking of fluid.   The following portions of the patient's history were reviewed and updated as appropriate: allergies, current medications, past family history, past medical history, past social history, past surgical history and problem list. Problem list updated.  Objective:   Vitals:   09/18/17 1323  BP: (!) 114/58  Pulse: 86  Weight: 165 lb 8 oz (75.1 kg)    Fetal Status: Fetal Heart Rate (bpm): 148 Fundal Height: 34 cm Movement: Present     General:  Alert, oriented and cooperative. Patient is in no acute distress.  Skin: Skin is warm and dry. No rash noted.   Cardiovascular: Normal heart rate noted  Respiratory: Normal respiratory effort, no problems with respiration noted  Abdomen: Soft, gravid, appropriate for gestational age. Pain/Pressure: Present     Pelvic:  Cervical exam deferred        Extremities: Normal range of motion.  Edema: Trace  Mental Status: Normal mood and affect. Normal behavior. Normal judgment and thought content.   Urinalysis:      Assessment and Plan:  Pregnancy: [id number] at 6252w1d  1. Supervision of high risk pregnancy, antepartum, third trimester BTL papers already and pt confirms still desires this. gbs nv -  US MFM OB FOLLOW UP; Future  2. GDM, class A1 Didn't bring log but but gives normal am fasting and 2hr PP with lunch ones borderline. Pt told to watch diet and hope to keep a1 control. 38wk growth u/s ordered.  - US MFM OB FOLLOW UP; Future  Preterm labor symptoms and general obstetric precautions including but not limited to vaginal bleeding, contractions, leaking of fluid and fetal movement were reviewed in detail with the patient. Please refer to After Visit Summary for other counseling recommendations.  Return in about 1 week (around 09/25/2017) for hrob.   [place] BingPickens, Treasure Ingrum, MD

## 2017-09-22 ENCOUNTER — Inpatient Hospital Stay (HOSPITAL_COMMUNITY)
Admission: AD | Admit: 2017-09-22 | Discharge: 2017-09-23 | Disposition: A | Discharge status: Home / Self Care | Payer: Medicaid Other | Source: Ambulatory Visit | Attending: Obstetrics & Gynecology | Admitting: Obstetrics & Gynecology

## 2017-09-22 ENCOUNTER — Encounter (HOSPITAL_COMMUNITY): Payer: Self-pay | Admitting: *Deleted

## 2017-09-22 DIAGNOSIS — Z3A34 34 weeks gestation of pregnancy: Secondary | ICD-10-CM | POA: Insufficient documentation

## 2017-09-22 DIAGNOSIS — O479 False labor, unspecified: Secondary | ICD-10-CM

## 2017-09-22 DIAGNOSIS — O4700 False labor before 37 completed weeks of gestation, unspecified trimester: Secondary | ICD-10-CM

## 2017-09-22 DIAGNOSIS — F17210 Nicotine dependence, cigarettes, uncomplicated: Secondary | ICD-10-CM | POA: Diagnosis not present

## 2017-09-22 DIAGNOSIS — O4703 False labor before 37 completed weeks of gestation, third trimester: Secondary | ICD-10-CM

## 2017-09-22 DIAGNOSIS — O288 Other abnormal findings on antenatal screening of mother: Secondary | ICD-10-CM

## 2017-09-22 DIAGNOSIS — R1030 Lower abdominal pain, unspecified: Secondary | ICD-10-CM | POA: Diagnosis present

## 2017-09-22 DIAGNOSIS — O99333 Smoking (tobacco) complicating pregnancy, third trimester: Secondary | ICD-10-CM | POA: Insufficient documentation

## 2017-09-22 DIAGNOSIS — Z885 Allergy status to narcotic agent status: Secondary | ICD-10-CM | POA: Diagnosis not present

## 2017-09-22 DIAGNOSIS — N949 Unspecified condition associated with female genital organs and menstrual cycle: Secondary | ICD-10-CM

## 2017-09-22 LAB — URINALYSIS, ROUTINE W REFLEX MICROSCOPIC
Bilirubin Urine: NEGATIVE
GLUCOSE, UA: NEGATIVE mg/dL
Hgb urine dipstick: NEGATIVE
Ketones, ur: NEGATIVE mg/dL
LEUKOCYTES UA: NEGATIVE
Nitrite: NEGATIVE
PH: 6 (ref 5.0–8.0)
Protein, ur: NEGATIVE mg/dL
Specific Gravity, Urine: 1.01 (ref 1.005–1.030)

## 2017-09-22 MED ORDER — ACETAMINOPHEN 500 MG PO TABS
1000.0000 mg | ORAL_TABLET | Freq: Once | ORAL | Status: AC
  Administered 2017-09-22: 1000 mg via ORAL
  Filled 2017-09-22: qty 2

## 2017-09-22 MED ORDER — LACTATED RINGERS IV BOLUS
1000.0000 mL | Freq: Once | INTRAVENOUS | Status: AC
  Administered 2017-09-22: 1000 mL via INTRAVENOUS

## 2017-09-22 NOTE — MAU Provider Note (Addendum)
History  CSN: [id number] Arrival date and time: 09/22/17 2141  None    HPI: Maureen Perez is a 32 y.o. [id number] with IUP at [redacted]w[redacted]d who presents to maternity admissions reporting abdominal tightening and lower abdominal pain. She reports pain starters earlier today on bilateral lower abdomen radiating to the groin region. Also reports feeling some intermittent tightening. Has not tried to take anything for her pain. Denies vaginal bleeding, LOF or vaginal discharge. Reports good fetal movement. Also denies any dysuria, hematuria, urinary frequency, nausea, vomiting, diarrhea, RUQ/epigastric pain, dizziness/lighreadhess, or headache.   She [facility]. Pregnancy complicated by diet-controlled GDM.   OB History  Gravida Para Term Preterm AB Living  5 2 2  0 2 2  SAB TAB Ectopic Multiple Live Births  2 0 0 0 2    # Outcome Date GA Lbr Len/2nd Weight Sex Delivery Anes PTL Lv  5 Current           4 Term 08/09/13 [redacted]w[redacted]d 04:44 / 00:08 5 lb 9.6 oz (2.54 kg) F Vag-Spont EPI  LIV  3 Term 09/01/11 [redacted]w[redacted]d 21:43 / 01:13 6 lb 2.9 oz (2.805 kg) F Vag-Spont EPI  LIV     Birth Comments: No anomalies noted  2 SAB           1 SAB  [redacted]w[redacted]d          Past Medical History:  Diagnosis Date  . Abnormal Pap smear    repeat WNL  . Back pain   . Cholelithiasis   . Depression    no meds since pregancny  . Family history of adverse reaction to anesthesia    " MOTHER TAKES ALONG TIME TO WAKE "  . Gestational diabetes 08/20/2017   gestational - diet controlled  . Hearing loss    as a child - tubes in ear  . Infection    UTI  . Injury of tendon of left rotator cuff   . Obesity   . PONV (postoperative nausea and vomiting)   . Vaginal Pap smear, abnormal    Past Surgical History:  Procedure Laterality Date  . CHOLECYSTECTOMY N/A 12/05/2014   Procedure: LAPAROSCOPIC CHOLECYSTECTOMY WITH INTRAOPERATIVE CHOLANGIOGRAM;  Surgeon: Chevis Pretty III, MD;  Location: [facility] OR;  Service: General;  Laterality: N/A;  .  TONSILLECTOMY     age 24  . tubes in ears  age 89   Family History  Problem Relation Age of Onset  . Depression Mother   . Diabetes Mother   . Hypertension Mother   . COPD Mother   . Anesthesia problems Mother   . Depression Sister   . Diabetes Sister   . Kidney disease Sister   . Hypertension Sister   . Hypertension Father   . Anesthesia problems Father   . COPD Father    Social History   Socioeconomic History  . Marital status: Single    Spouse name: Not on file  . Number of children: Not on file  . Years of education: Not on file  . Highest education level: Not on file  Occupational History  . Not on file  Social Needs  . Financial resource strain: Not on file  . Food insecurity:    Worry: Not on file    Inability: Not on file  . Transportation needs:    Medical: Not on file    Non-medical: Not on file  Tobacco Use  . Smoking status: Current Every Day Smoker    Packs/day: 0.50  Years: 10.00    Pack years: 5.00    Types: Cigarettes  . Smokeless tobacco: Never Used  Substance and Sexual Activity  . Alcohol use: No    Alcohol/week: 0.0 oz  . Drug use: No    Comment: not any more  . Sexual activity: Yes    Birth control/protection: None    Comment: last sex 21 September 2017  Lifestyle  . Physical activity:    Days per week: Not on file    Minutes per session: Not on file  . Stress: Not on file  Relationships  . Social connections:    Talks on phone: Not on file    Gets together: Not on file    Attends religious service: Not on file    Active member of club or organization: Not on file    Attends meetings of clubs or organizations: Not on file    Relationship status: Not on file  . Intimate partner violence:    Fear of current or ex partner: Not on file    Emotionally abused: Not on file    Physically abused: Not on file    Forced sexual activity: Not on file  Other Topics Concern  . Not on file  Social History Narrative  . Not on file   Allergies   Allergen Reactions  . Cephalexin Itching and Nausea And Vomiting  . Percocet [Oxycodone-Acetaminophen] Nausea And Vomiting    Medications Prior to Admission  Medication Sig Dispense Refill Last Dose  . ACCU-CHEK FASTCLIX LANCETS MISC 1 Units by Percutaneous route 4 (four) times daily. 100 each 12 09/22/2017 at Unknown time  . acetaminophen (TYLENOL) 325 MG tablet Take 650 mg by mouth every 6 (six) hours as needed for mild pain or headache.   Past Week at Unknown time  . glucose blood test strip Use as instructed- check blood glucose four times per day. 100 each 12 09/22/2017 at Unknown time  . Prenatal Multivit-Min-Fe-FA (PRENATAL VITAMINS PO) Take 1 tablet by mouth daily.   09/22/2017 at Unknown time  . Elastic Bandages & Supports (COMFORT FIT MATERNITY SUPP MED) MISC 1 Device by Does not apply route daily. (Patient not taking: Reported on 08/12/2017) 1 each 0 Not Taking    I have reviewed patient's Past Medical Hx, Surgical Hx, Family Hx, Social Hx, medications and allergies.   Review of Systems: Negative except for what is mentioned in HPI.  Physical Exam   Blood pressure 113/66, pulse 82, temperature 98.4 F (36.9 C), resp. rate 16, height 5\' 1"  (1.549 m), weight 164 lb (74.4 kg), last menstrual period 01/22/2017.  Constitutional: Well-developed, well-nourished female in no acute distress.  HENT: [place]/AT, normal oropharynx mucosa. MMM Eyes: normal conjunctivae, no scleral icterus Cardiovascular: normal rate, regular rhythm Respiratory: normal effort, lungs CTAB.  GI: Abd soft, non-tender, gravid appropriate for gestational age.   GU: Neg CVAT. SVE: 1cm/thick/bollatoble  MSK: Extremities nontender, no edema Neurologic: Alert and oriented x 4. Psych: Normal mood and affect Skin: warm and dry   FHT:  Baseline 130 , moderate variability, accelerations present, no decelerations Toco: UI, irregular ctx  MAU Course/MDM:   Nursing notes and VS reviewed. Patient seen and examined, as  noted above.  UA wnl SVE as above.   1 Liter IVFs given. Tylenol 1,000 mg for pain.  Procardia x 1 given  Pt still has some discomfort, but decreased contractions. SVE unchanged. Will d/c home with return precautions.   Assessment and Plan  Assessment: 1. Preterm contractions  2. Non-reactive NST (non-stress test)   3. Round ligament pain     Plan: --Discharge home in stable condition.  --Discussed management of pain at home --Discussed return precautions, including PTL/PPROM precautions, or decreased fetal movement.     Fusae Florio, Kandra NicolasJulie P, MD 09/23/2017 12:41 AM

## 2017-09-22 NOTE — MAU Note (Signed)
Pt was at work today standing when the mid abdominal, intermittent tightening and low bilateral sharp, abdominal pain started. She says the pain is worse when she goes from sitting to standing. She reports fetal movement and denies any vag bleeding or leaking.

## 2017-09-23 ENCOUNTER — Encounter: Payer: Medicaid Other | Admitting: Medical

## 2017-09-23 MED ORDER — NIFEDIPINE 10 MG PO CAPS
10.0000 mg | ORAL_CAPSULE | Freq: Once | ORAL | Status: DC
  Filled 2017-09-23: qty 1

## 2017-09-23 NOTE — Discharge Instructions (Signed)
For pain, you my take Tylenol 1,000 mg (2 extra strength, or 3 regular strength) every 6 to 8 hours Increase your water intake to at least 80 oz a day

## 2017-09-27 ENCOUNTER — Inpatient Hospital Stay (HOSPITAL_COMMUNITY)
Admission: AD | Admit: 2017-09-27 | Discharge: 2017-09-27 | Disposition: A | Discharge status: Home / Self Care | Payer: Medicaid Other | Source: Ambulatory Visit | Attending: Obstetrics & Gynecology | Admitting: Obstetrics & Gynecology

## 2017-09-27 ENCOUNTER — Encounter (HOSPITAL_COMMUNITY): Payer: Self-pay | Admitting: *Deleted

## 2017-09-27 DIAGNOSIS — Z3483 Encounter for supervision of other normal pregnancy, third trimester: Secondary | ICD-10-CM | POA: Diagnosis not present

## 2017-09-27 DIAGNOSIS — O24410 Gestational diabetes mellitus in pregnancy, diet controlled: Secondary | ICD-10-CM

## 2017-09-27 DIAGNOSIS — Z3A35 35 weeks gestation of pregnancy: Secondary | ICD-10-CM

## 2017-09-27 DIAGNOSIS — Z0371 Encounter for suspected problem with amniotic cavity and membrane ruled out: Secondary | ICD-10-CM | POA: Diagnosis not present

## 2017-09-27 LAB — URINALYSIS, ROUTINE W REFLEX MICROSCOPIC
Bilirubin Urine: NEGATIVE
GLUCOSE, UA: NEGATIVE mg/dL
HGB URINE DIPSTICK: NEGATIVE
Ketones, ur: NEGATIVE mg/dL
NITRITE: NEGATIVE
PH: 6 (ref 5.0–8.0)
Protein, ur: NEGATIVE mg/dL
SPECIFIC GRAVITY, URINE: 1.018 (ref 1.005–1.030)

## 2017-09-27 LAB — POCT FERN TEST: POCT Fern Test: NEGATIVE

## 2017-09-27 NOTE — MAU Provider Note (Signed)
First Provider Initiated Contact with Patient 09/27/17 2130     S: Ms. Maureen Perez is a 32 y.o. [id number] at 9014w3d  who presents to MAU today complaining of leaking of fluid since this morning. She denies vaginal bleeding. She denies contractions. She reports normal fetal movement.    O: BP 120/78   Pulse 81   Temp 97.6 F (36.4 C)   Resp 18   Ht 5\' 1"  (1.549 m)   Wt 164 lb (74.4 kg)   LMP 01/22/2017 (Exact Date)   BMI 30.99 kg/m  GENERAL: Well-developed, well-nourished female in no acute distress.  HEAD: Normocephalic, atraumatic.  CHEST: Normal effort of breathing, regular heart rate ABDOMEN: Soft, nontender, gravid PELVIC: Normal external female genitalia. Vagina is pink and rugated. Cervix with normal contour, no lesions. Normal discharge.  No pooling.   Cervical exam:  Dilation: Closed Effacement (%): Thick Cervical Position: Middle Exam by:: Ma Hillock. Neill CNM  Fetal Monitoring: Baseline: 130 Variability: moderate Accelerations: 15x15 Decelerations: none Contractions: none  Results for orders placed or performed during the hospital encounter of 09/27/17 (from the past 24 hour(s))  Urinalysis, Routine w reflex microscopic     Status: Abnormal   Collection Time: 09/27/17  9:00 PM  Result Value Ref Range   Color, Urine YELLOW YELLOW   APPearance HAZY (A) CLEAR   Specific Gravity, Urine 1.018 1.005 - 1.030   pH 6.0 5.0 - 8.0   Glucose, UA NEGATIVE NEGATIVE mg/dL   Hgb urine dipstick NEGATIVE NEGATIVE   Bilirubin Urine NEGATIVE NEGATIVE   Ketones, ur NEGATIVE NEGATIVE mg/dL   Protein, ur NEGATIVE NEGATIVE mg/dL   Nitrite NEGATIVE NEGATIVE   Leukocytes, UA TRACE (A) NEGATIVE   RBC / HPF 6-30 0 - 5 RBC/hpf   WBC, UA 6-30 0 - 5 WBC/hpf   Bacteria, UA RARE (A) NONE SEEN   Squamous Epithelial / LPF 6-30 (A) NONE SEEN   Mucus PRESENT   Fern Test     Status: None   Collection Time: 09/27/17  9:44 PM  Result Value Ref Range   POCT Fern Test Negative = intact amniotic  membranes    A: SIUP at 4314w3d  Membranes intact  P: Discharge patient home  -Labor precautions discussed -[facility] -Patient may return to MAU as needed or if her condition were to change or worsen  Rolm Bookbindereill, Caroline M, [place] 09/27/2017 9:31 PM

## 2017-09-27 NOTE — MAU Note (Signed)
Pt reports she has been leaking clear fluid all dy. Has had to wear a pad and it is wet. Reports some pan and cramping on and off all day and good fetal movement felt.

## 2017-09-27 NOTE — Discharge Instructions (Signed)
Braxton Hicks Contractions °Contractions of the uterus can occur throughout pregnancy, but they are not always a sign that you are in labor. You may have practice contractions called Braxton Hicks contractions. These false labor contractions are sometimes confused with true labor. °What are Braxton Hicks contractions? °Braxton Hicks contractions are tightening movements that occur in the muscles of the uterus before labor. Unlike true labor contractions, these contractions do not result in opening (dilation) and thinning of the cervix. Toward the end of pregnancy (32-34 weeks), Braxton Hicks contractions can happen more often and may become stronger. These contractions are sometimes difficult to tell apart from true labor because they can be very uncomfortable. You should not feel embarrassed if you go to the hospital with false labor. °Sometimes, the only way to tell if you are in true labor is for your health care provider to look for changes in the cervix. The health care provider will do a physical exam and may monitor your contractions. If you are not in true labor, the exam should show that your cervix is not dilating and your water has not broken. °If there are other health problems associated with your pregnancy, it is completely safe for you to be sent home with false labor. You may continue to have Braxton Hicks contractions until you go into true labor. °How to tell the difference between true labor and false labor °True labor °· Contractions last 30-70 seconds. °· Contractions become very regular. °· Discomfort is usually felt in the top of the uterus, and it spreads to the lower abdomen and low back. °· Contractions do not go away with walking. °· Contractions usually become more intense and increase in frequency. °· The cervix dilates and gets thinner. °False labor °· Contractions are usually shorter and not as strong as true labor contractions. °· Contractions are usually irregular. °· Contractions  are often felt in the front of the lower abdomen and in the groin. °· Contractions may go away when you walk around or change positions while lying down. °· Contractions get weaker and are shorter-lasting as time goes on. °· The cervix usually does not dilate or become thin. °Follow these instructions at home: °· Take over-the-counter and prescription medicines only as told by your health care provider. °· Keep up with your usual exercises and follow other instructions from your health care provider. °· Eat and drink lightly if you think you are going into labor. °· If Braxton Hicks contractions are making you uncomfortable: °? Change your position from lying down or resting to walking, or change from walking to resting. °? Sit and rest in a tub of warm water. °? Drink enough fluid to keep your urine pale yellow. Dehydration may cause these contractions. °? Do slow and deep breathing several times an hour. °· Keep all follow-up prenatal visits as told by your health care provider. This is important. °Contact a health care provider if: °· You have a fever. °· You have continuous pain in your abdomen. °Get help right away if: °· Your contractions become stronger, more regular, and closer together. °· You have fluid leaking or gushing from your vagina. °· You pass blood-tinged mucus (bloody show). °· You have bleeding from your vagina. °· You have low back pain that you never had before. °· You feel your baby’s head pushing down and causing pelvic pressure. °· Your baby is not moving inside you as much as it used to. °Summary °· Contractions that occur before labor are called Braxton   Hicks contractions, false labor, or practice contractions. °· Braxton Hicks contractions are usually shorter, weaker, farther apart, and less regular than true labor contractions. True labor contractions usually become progressively stronger and regular and they become more frequent. °· Manage discomfort from Braxton Hicks contractions by  changing position, resting in a warm bath, drinking plenty of water, or practicing deep breathing. °This information is not intended to replace advice given to you by your health care provider. Make sure you discuss any questions you have with your health care provider. °Document Released: 10/17/2016 Document Revised: 10/17/2016 Document Reviewed: 10/17/2016 °[employer] Interactive Patient Education © 2018 [employer] ° °

## 2017-09-28 ENCOUNTER — Inpatient Hospital Stay (HOSPITAL_COMMUNITY)
Admission: AD | Admit: 2017-09-28 | Discharge: 2017-09-28 | Disposition: A | Discharge status: Home / Self Care | Payer: Medicaid Other | Source: Ambulatory Visit | Attending: Obstetrics & Gynecology | Admitting: Obstetrics & Gynecology

## 2017-09-28 ENCOUNTER — Encounter (HOSPITAL_COMMUNITY): Payer: Self-pay

## 2017-09-28 ENCOUNTER — Other Ambulatory Visit: Payer: Self-pay

## 2017-09-28 DIAGNOSIS — F17210 Nicotine dependence, cigarettes, uncomplicated: Secondary | ICD-10-CM | POA: Insufficient documentation

## 2017-09-28 DIAGNOSIS — Z825 Family history of asthma and other chronic lower respiratory diseases: Secondary | ICD-10-CM | POA: Insufficient documentation

## 2017-09-28 DIAGNOSIS — Z885 Allergy status to narcotic agent status: Secondary | ICD-10-CM | POA: Diagnosis not present

## 2017-09-28 DIAGNOSIS — M549 Dorsalgia, unspecified: Secondary | ICD-10-CM | POA: Diagnosis present

## 2017-09-28 DIAGNOSIS — Z833 Family history of diabetes mellitus: Secondary | ICD-10-CM | POA: Insufficient documentation

## 2017-09-28 DIAGNOSIS — O99333 Smoking (tobacco) complicating pregnancy, third trimester: Secondary | ICD-10-CM | POA: Insufficient documentation

## 2017-09-28 DIAGNOSIS — O4703 False labor before 37 completed weeks of gestation, third trimester: Secondary | ICD-10-CM | POA: Diagnosis present

## 2017-09-28 DIAGNOSIS — O24410 Gestational diabetes mellitus in pregnancy, diet controlled: Secondary | ICD-10-CM

## 2017-09-28 DIAGNOSIS — Z8249 Family history of ischemic heart disease and other diseases of the circulatory system: Secondary | ICD-10-CM | POA: Insufficient documentation

## 2017-09-28 DIAGNOSIS — Z9049 Acquired absence of other specified parts of digestive tract: Secondary | ICD-10-CM | POA: Insufficient documentation

## 2017-09-28 DIAGNOSIS — Z881 Allergy status to other antibiotic agents status: Secondary | ICD-10-CM | POA: Insufficient documentation

## 2017-09-28 DIAGNOSIS — Z841 Family history of disorders of kidney and ureter: Secondary | ICD-10-CM | POA: Diagnosis not present

## 2017-09-28 DIAGNOSIS — Z3A35 35 weeks gestation of pregnancy: Secondary | ICD-10-CM | POA: Insufficient documentation

## 2017-09-28 DIAGNOSIS — Z87440 Personal history of urinary (tract) infections: Secondary | ICD-10-CM | POA: Insufficient documentation

## 2017-09-28 LAB — AMNISURE RUPTURE OF MEMBRANE (ROM) NOT AT ARMC: Amnisure ROM: NEGATIVE

## 2017-09-28 LAB — URINALYSIS, ROUTINE W REFLEX MICROSCOPIC
Bilirubin Urine: NEGATIVE
Glucose, UA: NEGATIVE mg/dL
Hgb urine dipstick: NEGATIVE
Ketones, ur: NEGATIVE mg/dL
Leukocytes, UA: NEGATIVE
Nitrite: NEGATIVE
Protein, ur: NEGATIVE mg/dL
Specific Gravity, Urine: 1.013 (ref 1.005–1.030)
pH: 6 (ref 5.0–8.0)

## 2017-09-28 LAB — WET PREP, GENITAL
CLUE CELLS WET PREP: NONE SEEN
Sperm: NONE SEEN
Trich, Wet Prep: NONE SEEN
Yeast Wet Prep HPF POC: NONE SEEN

## 2017-09-28 MED ORDER — NIFEDIPINE 10 MG PO CAPS
10.0000 mg | ORAL_CAPSULE | Freq: Once | ORAL | Status: AC
  Administered 2017-09-28: 10 mg via ORAL
  Filled 2017-09-28: qty 1

## 2017-09-28 NOTE — MAU Note (Signed)
Sudden onset of pain in mid back, is constant.  Also having increased pelvic pressure, "feels like she is sitting on the baby".  Had a gush of clear fluid at 1230, occurred one time, none since

## 2017-09-28 NOTE — Discharge Instructions (Signed)
Return to Maternity Admissions Unit for more than 6 strong, painful contractions in 1 hour.

## 2017-09-28 NOTE — MAU Provider Note (Signed)
History     CSN: [id number]  Arrival date and time: 09/28/17 1800   First Provider Initiated Contact with Patient 09/28/17 1844      Chief Complaint  Patient presents with  . Back Pain  . pelvic pressure   HPI  Ms.  Maureen Perez is a 32 y.o. year old G89P2022 female at [redacted]w[redacted]d weeks gestation who presents to MAU reporting "gush of clear fluid that ran down her legs". She reports that she was in the kitchen looking for something to eat when she felt the gush. She reports having SI at 0200 this morning, but does not think this is the same thing. She reports "not feeling well all day today; like she sitting on him (the baby)". She denies VB.  Past Medical History:  Diagnosis Date  . Abnormal Pap smear    repeat WNL  . Back pain   . Cholelithiasis   . Depression    no meds since pregancny  . Family history of adverse reaction to anesthesia    " MOTHER TAKES ALONG TIME TO WAKE "  . Gestational diabetes 08/20/2017   gestational - diet controlled  . Hearing loss    as a child - tubes in ear  . Infection    UTI  . Injury of tendon of left rotator cuff   . Obesity   . PONV (postoperative nausea and vomiting)   . Vaginal Pap smear, abnormal     Past Surgical History:  Procedure Laterality Date  . CHOLECYSTECTOMY N/A 12/05/2014   Procedure: LAPAROSCOPIC CHOLECYSTECTOMY WITH INTRAOPERATIVE CHOLANGIOGRAM;  Surgeon: Chevis Pretty III, MD;  Location: [facility] OR;  Service: General;  Laterality: N/A;  . TONSILLECTOMY     age 79  . tubes in ears  age 89    Family History  Problem Relation Age of Onset  . Depression Mother   . Diabetes Mother   . Hypertension Mother   . COPD Mother   . Anesthesia problems Mother   . Depression Sister   . Diabetes Sister   . Kidney disease Sister   . Hypertension Sister   . Hypertension Father   . Anesthesia problems Father   . COPD Father     Social History   Tobacco Use  . Smoking status: Current Every Day Smoker    Packs/day: 0.50    Years:  10.00    Pack years: 5.00    Types: Cigarettes  . Smokeless tobacco: Never Used  Substance Use Topics  . Alcohol use: No    Alcohol/week: 0.0 oz  . Drug use: No    Comment: not any more    Allergies:  Allergies  Allergen Reactions  . Cephalexin Itching and Nausea And Vomiting  . Percocet [Oxycodone-Acetaminophen] Nausea And Vomiting    Medications Prior to Admission  Medication Sig Dispense Refill Last Dose  . ACCU-CHEK FASTCLIX LANCETS MISC 1 Units by Percutaneous route 4 (four) times daily. 100 each 12 09/22/2017 at Unknown time  . acetaminophen (TYLENOL) 325 MG tablet Take 650 mg by mouth every 6 (six) hours as needed for mild pain or headache.   Past Week at Unknown time  . Elastic Bandages & Supports (COMFORT FIT MATERNITY SUPP MED) MISC 1 Device by Does not apply route daily. (Patient not taking: Reported on 08/12/2017) 1 each 0 Not Taking  . glucose blood test strip Use as instructed- check blood glucose four times per day. 100 each 12 09/22/2017 at Unknown time  . Prenatal Multivit-Min-Fe-FA (PRENATAL VITAMINS  PO) Take 1 tablet by mouth daily.   09/22/2017 at Unknown time    Review of Systems  Constitutional: Negative.   HENT: Negative.   Eyes: Negative.   Respiratory: Negative.   Cardiovascular: Negative.   Gastrointestinal: Negative.   Endocrine: Negative.   Genitourinary: Positive for pelvic pain and vaginal discharge ("gushing clear fluid"; "ran down legs").  Musculoskeletal: Negative.   Skin: Negative.   Allergic/Immunologic: Negative.   Neurological: Negative.   Hematological: Negative.   Psychiatric/Behavioral: Negative.    Physical Exam   Blood pressure 120/62, pulse 87, temperature 98 F (36.7 C), temperature source Oral, resp. rate 18, last menstrual period 01/22/2017.  Physical Exam  Nursing note and vitals reviewed. Constitutional: She is oriented to person, place, and time. She appears well-developed and well-nourished.  HENT:  Head: Normocephalic and  atraumatic.  Mouth/Throat: Abnormal dentition. Dental caries (multiple) present.  Eyes: Pupils are equal, round, and reactive to light.  Neck: Normal range of motion.  Cardiovascular: Normal rate, regular rhythm and normal heart sounds.  Respiratory: Effort normal and breath sounds normal.  GI: Soft. Bowel sounds are normal.  Genitourinary:  Genitourinary Comments: Uterus: gravid, S=D, SE: cervix is smooth, pink, no lesions, small amt of clear, white, mucoid vaginal d/c -- WP done, ext os 1 cm, int os closed/50%/soft, no CMT or friability, no adnexal tenderness   Musculoskeletal: Normal range of motion.  Neurological: She is alert and oriented to person, place, and time. She has normal reflexes.  Skin: Skin is warm and dry.  Psychiatric: She has a normal mood and affect. Her behavior is normal. Judgment and thought content normal.    MAU Course  Procedures  MDM CCUA Wet Prep Amnisure Procardia 10 mg x 1 dose for patient comfort for d/c home NST - FHR: 145 bpm / moderate variability / accels present / decels absent / TOCO: regular every 3-4 mins // spaced out to 4-9 mins with UI noted; after procardia  Results for orders placed or performed during the hospital encounter of 09/28/17 (from the past 24 hour(s))  Urinalysis, Routine w reflex microscopic     Status: Abnormal   Collection Time: 09/28/17  6:10 PM  Result Value Ref Range   Color, Urine YELLOW YELLOW   APPearance HAZY (A) CLEAR   Specific Gravity, Urine 1.013 1.005 - 1.030   pH 6.0 5.0 - 8.0   Glucose, UA NEGATIVE NEGATIVE mg/dL   Hgb urine dipstick NEGATIVE NEGATIVE   Bilirubin Urine NEGATIVE NEGATIVE   Ketones, ur NEGATIVE NEGATIVE mg/dL   Protein, ur NEGATIVE NEGATIVE mg/dL   Nitrite NEGATIVE NEGATIVE   Leukocytes, UA NEGATIVE NEGATIVE  Wet prep, genital     Status: Abnormal   Collection Time: 09/28/17  6:55 PM  Result Value Ref Range   Yeast Wet Prep HPF POC NONE SEEN NONE SEEN   Trich, Wet Prep NONE SEEN NONE  SEEN   Clue Cells Wet Prep HPF POC NONE SEEN NONE SEEN   WBC, Wet Prep HPF POC FEW (A) NONE SEEN   Sperm NONE SEEN   Amnisure rupture [facility] membrane (rom[facility]     Status: None   Collection Time: 09/28/17  6:55 PM  Result Value Ref Range   Amnisure ROM NEGATIVE    Assessment and Plan  Preterm uterine contractions in third trimester, antepartum  - Reassurance given that cervix is closed - Advised to return to MAU for 6 or more strong and painful UC's - Stay well-hydrated at least 8-10 glasses -  Discharge patient - Patient verbalized an understanding of the plan of care and agrees.    Raelyn Mora, MSN, CNM 09/28/2017, 6:55 PM

## 2017-10-02 ENCOUNTER — Other Ambulatory Visit (HOSPITAL_COMMUNITY)
Admission: RE | Admit: 2017-10-02 | Discharge: 2017-10-02 | Disposition: A | Discharge status: Home / Self Care | Payer: Medicaid Other | Source: Ambulatory Visit | Attending: Obstetrics and Gynecology | Admitting: Obstetrics and Gynecology

## 2017-10-02 ENCOUNTER — Encounter: Payer: Self-pay | Admitting: Obstetrics and Gynecology

## 2017-10-02 ENCOUNTER — Ambulatory Visit (INDEPENDENT_AMBULATORY_CARE_PROVIDER_SITE_OTHER): Payer: Medicaid Other | Admitting: Obstetrics and Gynecology

## 2017-10-02 VITALS — BP 108/70 | HR 91 | Wt 163.7 lb

## 2017-10-02 DIAGNOSIS — O0993 Supervision of high risk pregnancy, unspecified, third trimester: Secondary | ICD-10-CM

## 2017-10-02 DIAGNOSIS — O24410 Gestational diabetes mellitus in pregnancy, diet controlled: Secondary | ICD-10-CM

## 2017-10-02 LAB — OB RESULTS CONSOLE GBS: GBS: NEGATIVE

## 2017-10-02 LAB — OB RESULTS CONSOLE GC/CHLAMYDIA: Gonorrhea: NEGATIVE

## 2017-10-02 NOTE — Progress Notes (Signed)
   PRENATAL VISIT NOTE  Subjective:  Maureen Perez is a 32 y.o. [phone] at 4478w1d being seen today for ongoing prenatal care.  She is currently monitored for the following issues for this high-risk pregnancy and has OBESITY; DEPRESSION, MODERATE, RECURRENT; TOBACCO ABUSE; TINNITUS, CHRONIC, BILATERAL; ALLERGIC RHINITIS DUE TO ANIMAL HAIR AND DANDER; INSOMNIA; Tobacco use disorder affecting pregnancy, antepartum; GERD (gastroesophageal reflux disease); Supervision of high risk pregnancy, antepartum, third trimester; Rampant dental caries; GDM, class A1; and Preterm uterine contractions in third trimester, antepartum on their problem list.  Patient reports backache and occasional contractions.  Contractions: Irritability. Vag. Bleeding: None.  Movement: Present. Denies leaking of fluid.   The following portions of the patient's history were reviewed and updated as appropriate: allergies, current medications, past family history, past medical history, past social history, past surgical history and problem list. Problem list updated.  Objective:   Vitals:   10/02/17 1322  BP: 108/70  Pulse: 91  Weight: 163 lb 11.2 oz (74.3 kg)    Fetal Status: Fetal Heart Rate (bpm): 165 Fundal Height: 36 cm Movement: Present     General:  Alert, oriented and cooperative. Patient is in no acute distress.  Skin: Skin is warm and dry. No rash noted.   Cardiovascular: Normal heart rate noted  Respiratory: Normal respiratory effort, no problems with respiration noted  Abdomen: Soft, gravid, appropriate for gestational age.  Pain/Pressure: Present     Pelvic: Cervical exam performed        Extremities: Normal range of motion.  Edema: Trace  Mental Status: Normal mood and affect. Normal behavior. Normal judgment and thought content.   Assessment and Plan:  Pregnancy: [id number] at 6778w1d  1. Supervision of high risk pregnancy, antepartum, third trimester Patient is doing well without complaints Cultures  today - Culture, beta strep (group b only) - GC[facility]Chlamydia probe amp [facility]  2. GDM, class A1 Patient did not bring log book but reports highest fasting as 91 and highest pp as 140 (pp values are typically in the 120's) Growth scan scheduled at 38 weeks  Preterm labor symptoms and general obstetric precautions including but not limited to vaginal bleeding, contractions, leaking of fluid and fetal movement were reviewed in detail with the patient. Please refer to After Visit Summary for other counseling recommendations.  Return in about 1 week (around 10/09/2017) for ROB.  Future Appointments  Date Time Provider Department Center  10/09/2017 10:15 AM Levie HeritageStinson, Jacob J, DO [facility]  10/16/2017  1:15 PM [facility] US 4 [facility]    Catalina AntiguaPeggy Courtenay Hirth, MD

## 2017-10-03 LAB — GC/CHLAMYDIA PROBE AMP (~~LOC~~) NOT AT ARMC
CHLAMYDIA, DNA PROBE: NEGATIVE
NEISSERIA GONORRHEA: NEGATIVE

## 2017-10-06 ENCOUNTER — Encounter (HOSPITAL_COMMUNITY): Payer: Self-pay

## 2017-10-06 ENCOUNTER — Inpatient Hospital Stay (HOSPITAL_COMMUNITY): Payer: Medicaid Other

## 2017-10-06 ENCOUNTER — Inpatient Hospital Stay (HOSPITAL_COMMUNITY)
Admission: AD | Admit: 2017-10-06 | Discharge: 2017-10-06 | Disposition: A | Discharge status: Home / Self Care | Payer: Medicaid Other | Source: Ambulatory Visit | Attending: Obstetrics & Gynecology | Admitting: Obstetrics & Gynecology

## 2017-10-06 ENCOUNTER — Other Ambulatory Visit: Payer: Self-pay

## 2017-10-06 DIAGNOSIS — O99333 Smoking (tobacco) complicating pregnancy, third trimester: Secondary | ICD-10-CM | POA: Insufficient documentation

## 2017-10-06 DIAGNOSIS — Z3689 Encounter for other specified antenatal screening: Secondary | ICD-10-CM

## 2017-10-06 DIAGNOSIS — Z3A36 36 weeks gestation of pregnancy: Secondary | ICD-10-CM | POA: Diagnosis not present

## 2017-10-06 DIAGNOSIS — O288 Other abnormal findings on antenatal screening of mother: Secondary | ICD-10-CM

## 2017-10-06 DIAGNOSIS — O368130 Decreased fetal movements, third trimester, not applicable or unspecified: Secondary | ICD-10-CM | POA: Insufficient documentation

## 2017-10-06 LAB — CULTURE, BETA STREP (GROUP B ONLY): STREP GP B CULTURE: NEGATIVE

## 2017-10-06 LAB — AMNISURE RUPTURE OF MEMBRANE (ROM) NOT AT ARMC: Amnisure ROM: NEGATIVE

## 2017-10-06 NOTE — Discharge Instructions (Signed)

## 2017-10-06 NOTE — MAU Note (Signed)
Pt states she felt FM this am, but says it's not "his normal amt of moving".  Denies vag bleeding.  States occasional on going UCs as normal for her.

## 2017-10-06 NOTE — MAU Provider Note (Signed)
History   [id number]   Chief Complaint  Patient presents with  . Decreased Fetal Movement    HPI Maureen Perez is a 32 y.o. female  [phone] here with report of decreased fetal movement since this morning.  Reports feeling the baby move approximately 4 times today. Denies vaginal bleeding or leaking of fluid. Denies abdominal pain or contractions.  Patient's last menstrual period was 01/22/2017 (exact date).  OB History  Gravida Para Term Preterm AB Living  5 2 2  0 2 2  SAB TAB Ectopic Multiple Live Births  2 0 0 0 2    # Outcome Date GA Lbr Len/2nd Weight Sex Delivery Anes PTL Lv  5 Current           4 Term 08/09/13 [redacted]w[redacted]d 04:44 / 00:08 5 lb 9.6 oz (2.54 kg) F Vag-Spont EPI  LIV  3 Term 09/01/11 [redacted]w[redacted]d 21:43 / 01:13 6 lb 2.9 oz (2.805 kg) F Vag-Spont EPI  LIV     Birth Comments: No anomalies noted  2 SAB           1 SAB  [redacted]w[redacted]d           Past Medical History:  Diagnosis Date  . Abnormal Pap smear    repeat WNL  . Back pain   . Cholelithiasis   . Depression    no meds since pregancny  . Family history of adverse reaction to anesthesia    " MOTHER TAKES ALONG TIME TO WAKE "  . Gestational diabetes 08/20/2017   gestational - diet controlled  . Hearing loss    as a child - tubes in ear  . Infection    UTI  . Injury of tendon of left rotator cuff   . Obesity   . PONV (postoperative nausea and vomiting)     Family History  Problem Relation Age of Onset  . Depression Mother   . Diabetes Mother   . Hypertension Mother   . COPD Mother   . Anesthesia problems Mother   . Depression Sister   . Diabetes Sister   . Kidney disease Sister   . Hypertension Sister   . Hypertension Father   . Anesthesia problems Father   . COPD Father     Social History   Socioeconomic History  . Marital status: Single    Spouse name: Not on file  . Number of children: Not on file  . Years of education: Not on file  . Highest education level: Not on file  Occupational History  .  Not on file  Social Needs  . Financial resource strain: Not on file  . Food insecurity:    Worry: Not on file    Inability: Not on file  . Transportation needs:    Medical: Not on file    Non-medical: Not on file  Tobacco Use  . Smoking status: Current Every Day Smoker    Packs/day: 0.50    Years: 10.00    Pack years: 5.00    Types: Cigarettes  . Smokeless tobacco: Never Used  Substance and Sexual Activity  . Alcohol use: No    Alcohol/week: 0.0 oz  . Drug use: No    Comment: not any more  . Sexual activity: Yes    Birth control/protection: None    Comment: last sex 21 September 2017  Lifestyle  . Physical activity:    Days per week: Not on file    Minutes per session: Not on file  .  Stress: Not on file  Relationships  . Social connections:    Talks on phone: Not on file    Gets together: Not on file    Attends religious service: Not on file    Active member of club or organization: Not on file    Attends meetings of clubs or organizations: Not on file    Relationship status: Not on file  Other Topics Concern  . Not on file  Social History Narrative  . Not on file    Allergies  Allergen Reactions  . Cephalexin Itching and Nausea And Vomiting  . Percocet [Oxycodone-Acetaminophen] Nausea And Vomiting    No current facility-administered medications on file prior to encounter.    Current Outpatient Medications on File Prior to Encounter  Medication Sig Dispense Refill  . ACCU-CHEK FASTCLIX LANCETS MISC 1 Units by Percutaneous route 4 (four) times daily. 100 each 12  . acetaminophen (TYLENOL) 325 MG tablet Take 650 mg by mouth every 6 (six) hours as needed for mild pain or headache.    . Elastic Bandages & Supports (COMFORT FIT MATERNITY SUPP MED) MISC 1 Device by Does not apply route daily. (Patient not taking: Reported on 08/12/2017) 1 each 0  . glucose blood test strip Use as instructed- check blood glucose four times per day. 100 each 12  . Prenatal  Multivit-Min-Fe-FA (PRENATAL VITAMINS PO) Take 1 tablet by mouth daily.       Review of Systems  Constitutional: Negative.   Gastrointestinal: Negative.   Genitourinary: Negative.    Vitals:   10/06/17 1326 10/06/17 1331 10/06/17 1456 10/06/17 1536  BP: 116/64  112/62 112/63  Pulse: 63  61 64  Resp: 16  20 16   Temp: 98.4 F (36.9 C)  97.8 F (36.6 C) 98 F (36.7 C)  TempSrc: Oral  Oral Oral  SpO2:  100%    Weight:      Height:         Physical Exam   Vitals:   10/06/17 1325  Weight: 167 lb (75.8 kg)  Height: 5\' 1"  (1.549 m)    Physical Exam  Nursing note and vitals reviewed. Constitutional: She is oriented to person, place, and time. She appears well-developed and well-nourished. No distress.  HENT:  Head: Normocephalic and atraumatic.  Eyes: Conjunctivae are normal. Right eye exhibits no discharge. Left eye exhibits no discharge. No scleral icterus.  Neck: Normal range of motion.  Respiratory: Effort normal. No respiratory distress.  GI: Soft. There is no tenderness.  Neurological: She is alert and oriented to person, place, and time.  Skin: Skin is warm and dry. She is not diaphoretic.  Psychiatric: She has a normal mood and affect. Her behavior is normal. Judgment and thought content normal.    MAU Course  Procedures Results for orders placed or performed during the hospital encounter of 10/06/17 (from the past 24 hour(s))  Amnisure rupture of membrane (rom)not at [facility]     Status: None   Collection Time: 10/06/17  3:10 PM  Result Value Ref Range   Amnisure ROM NEGATIVE     MDM NST:  Baseline: 130 bpm, Variability: Good {> 6 bpm), Accelerations: Reactive and Decelerations: Absent Reactive NST. BPP 8/8. AFI 7.7, borderline low. Initially patient said no LOF. Discussed results and she says that she has had increase in thin discharge for the last week that was previously evaluated by her MD.  Amnisure negative.  Patient has next appointment in office  tomorrow   Assessment and Plan  A: 1. Decreased fetal movement affecting management of pregnancy in third trimester, single or unspecified fetus   2. [redacted] weeks gestation of pregnancy   3. NST (non-stress test) reactive   4. Amniotic fluid index borderline low    P: Discharge home Fetal kick counts Discussed reasons to return to MAU Keep f/u with OB   [name], Royal Vandevoort, NP 10/06/2017 1:40 PM

## 2017-10-07 ENCOUNTER — Ambulatory Visit (INDEPENDENT_AMBULATORY_CARE_PROVIDER_SITE_OTHER): Payer: Medicaid Other | Admitting: Obstetrics and Gynecology

## 2017-10-07 ENCOUNTER — Encounter: Payer: Medicaid Other | Admitting: Obstetrics and Gynecology

## 2017-10-07 VITALS — BP 107/65 | HR 70 | Wt 166.4 lb

## 2017-10-07 DIAGNOSIS — O24410 Gestational diabetes mellitus in pregnancy, diet controlled: Secondary | ICD-10-CM

## 2017-10-07 DIAGNOSIS — O0993 Supervision of high risk pregnancy, unspecified, third trimester: Secondary | ICD-10-CM

## 2017-10-09 ENCOUNTER — Encounter: Payer: Medicaid Other | Admitting: Family Medicine

## 2017-10-09 NOTE — Progress Notes (Signed)
Subjective:  Maureen Perez is a 32 y.o. [phone] at 9263w1d being seen today for ongoing prenatal care.  She is currently monitored for the following issues for this high-risk pregnancy and has OBESITY; DEPRESSION, MODERATE, RECURRENT; TOBACCO ABUSE; TINNITUS, CHRONIC, BILATERAL; ALLERGIC RHINITIS DUE TO ANIMAL HAIR AND DANDER; INSOMNIA; Tobacco use disorder affecting pregnancy, antepartum; GERD (gastroesophageal reflux disease); Supervision of high risk pregnancy, antepartum, third trimester; Rampant dental caries; GDM, class A1; and Preterm uterine contractions in third trimester, antepartum on their problem list.  Patient reports no complaints.  Contractions: Irregular. Vag. Bleeding: None.  Movement: Present. Denies leaking of fluid.   The following portions of the patient's history were reviewed and updated as appropriate: allergies, current medications, past family history, past medical history, past social history, past surgical history and problem list. Problem list updated.  Objective:   Vitals:   10/07/17 1450  BP: 107/65  Pulse: 70  Weight: 75.5 kg (166 lb 6.4 oz)    Fetal Status: Fetal Heart Rate (bpm): 140  Fundal Height: 36 cm Movement: Present     General:  Alert, oriented and cooperative. Patient is in no acute distress.  Skin: Skin is warm and dry. No rash noted.   Cardiovascular: Normal heart rate noted  Respiratory: Normal respiratory effort, no problems with respiration noted  Abdomen: Soft, gravid, appropriate for gestational age. Pain/Pressure: Present     Pelvic: Vag. Bleeding: None     Cervical exam deferred        Extremities: Normal range of motion.  Edema: Trace  Mental Status: Normal mood and affect. Normal behavior. Normal judgment and thought content.   Urinalysis:      Assessment and Plan:  Pregnancy: [id number] at 7863w1d  1. Supervision of high risk pregnancy, antepartum, third trimester Doing well without concerns. GBS negative.  2. GDM, class A1 No  log book today. Patient states her CBGs over the last week have been fairly controlled. Highest are pp and mainly after lunch with the highest being 130 this week. Has follow-up growth US scheduled.   Term labor symptoms and general obstetric precautions including but not limited to vaginal bleeding, contractions, leaking of fluid and fetal movement were reviewed in detail with the patient. Please refer to After Visit Summary for other counseling recommendations.  Return in about 1 week (around 10/14/2017) for ob visit.   Pincus LargePhelps, Jazma Y, DO

## 2017-10-09 NOTE — Patient Instructions (Signed)
Braxton Hicks Contractions °Contractions of the uterus can occur throughout pregnancy, but they are not always a sign that you are in labor. You may have practice contractions called Braxton Hicks contractions. These false labor contractions are sometimes confused with true labor. °What are Braxton Hicks contractions? °Braxton Hicks contractions are tightening movements that occur in the muscles of the uterus before labor. Unlike true labor contractions, these contractions do not result in opening (dilation) and thinning of the cervix. Toward the end of pregnancy (32-34 weeks), Braxton Hicks contractions can happen more often and may become stronger. These contractions are sometimes difficult to tell apart from true labor because they can be very uncomfortable. You should not feel embarrassed if you go to the hospital with false labor. °Sometimes, the only way to tell if you are in true labor is for your health care provider to look for changes in the cervix. The health care provider will do a physical exam and may monitor your contractions. If you are not in true labor, the exam should show that your cervix is not dilating and your water has not broken. °If there are other health problems associated with your pregnancy, it is completely safe for you to be sent home with false labor. You may continue to have Braxton Hicks contractions until you go into true labor. °How to tell the difference between true labor and false labor °True labor °· Contractions last 30-70 seconds. °· Contractions become very regular. °· Discomfort is usually felt in the top of the uterus, and it spreads to the lower abdomen and low back. °· Contractions do not go away with walking. °· Contractions usually become more intense and increase in frequency. °· The cervix dilates and gets thinner. °False labor °· Contractions are usually shorter and not as strong as true labor contractions. °· Contractions are usually irregular. °· Contractions  are often felt in the front of the lower abdomen and in the groin. °· Contractions may go away when you walk around or change positions while lying down. °· Contractions get weaker and are shorter-lasting as time goes on. °· The cervix usually does not dilate or become thin. °Follow these instructions at home: °· Take over-the-counter and prescription medicines only as told by your health care provider. °· Keep up with your usual exercises and follow other instructions from your health care provider. °· Eat and drink lightly if you think you are going into labor. °· If Braxton Hicks contractions are making you uncomfortable: °? Change your position from lying down or resting to walking, or change from walking to resting. °? Sit and rest in a tub of warm water. °? Drink enough fluid to keep your urine pale yellow. Dehydration may cause these contractions. °? Do slow and deep breathing several times an hour. °· Keep all follow-up prenatal visits as told by your health care provider. This is important. °Contact a health care provider if: °· You have a fever. °· You have continuous pain in your abdomen. °Get help right away if: °· Your contractions become stronger, more regular, and closer together. °· You have fluid leaking or gushing from your vagina. °· You pass blood-tinged mucus (bloody show). °· You have bleeding from your vagina. °· You have low back pain that you never had before. °· You feel your baby’s head pushing down and causing pelvic pressure. °· Your baby is not moving inside you as much as it used to. °Summary °· Contractions that occur before labor are called Braxton   Hicks contractions, false labor, or practice contractions. °· Braxton Hicks contractions are usually shorter, weaker, farther apart, and less regular than true labor contractions. True labor contractions usually become progressively stronger and regular and they become more frequent. °· Manage discomfort from Braxton Hicks contractions by  changing position, resting in a warm bath, drinking plenty of water, or practicing deep breathing. °This information is not intended to replace advice given to you by your health care provider. Make sure you discuss any questions you have with your health care provider. °Document Released: 10/17/2016 Document Revised: 10/17/2016 Document Reviewed: 10/17/2016 °Elsevier Interactive Patient Education © 2018 [employer] ° °

## 2017-10-15 ENCOUNTER — Encounter (HOSPITAL_COMMUNITY): Payer: Self-pay

## 2017-10-15 ENCOUNTER — Inpatient Hospital Stay (HOSPITAL_COMMUNITY)
Admission: AD | Admit: 2017-10-15 | Discharge: 2017-10-15 | Disposition: A | Discharge status: Home / Self Care | Payer: Medicaid Other | Source: Ambulatory Visit | Attending: Obstetrics & Gynecology | Admitting: Obstetrics & Gynecology

## 2017-10-15 DIAGNOSIS — O99333 Smoking (tobacco) complicating pregnancy, third trimester: Secondary | ICD-10-CM | POA: Diagnosis not present

## 2017-10-15 DIAGNOSIS — O479 False labor, unspecified: Secondary | ICD-10-CM

## 2017-10-15 DIAGNOSIS — O471 False labor at or after 37 completed weeks of gestation: Secondary | ICD-10-CM | POA: Insufficient documentation

## 2017-10-15 DIAGNOSIS — O26893 Other specified pregnancy related conditions, third trimester: Secondary | ICD-10-CM

## 2017-10-15 DIAGNOSIS — N898 Other specified noninflammatory disorders of vagina: Secondary | ICD-10-CM

## 2017-10-15 DIAGNOSIS — F17210 Nicotine dependence, cigarettes, uncomplicated: Secondary | ICD-10-CM | POA: Insufficient documentation

## 2017-10-15 DIAGNOSIS — Z3689 Encounter for other specified antenatal screening: Secondary | ICD-10-CM

## 2017-10-15 DIAGNOSIS — Z3A38 38 weeks gestation of pregnancy: Secondary | ICD-10-CM | POA: Diagnosis not present

## 2017-10-15 DIAGNOSIS — R1030 Lower abdominal pain, unspecified: Secondary | ICD-10-CM | POA: Diagnosis present

## 2017-10-15 LAB — POCT FERN TEST: POCT FERN TEST: NEGATIVE

## 2017-10-15 LAB — WET PREP, GENITAL
Clue Cells Wet Prep HPF POC: NONE SEEN
SPERM: NONE SEEN
TRICH WET PREP: NONE SEEN
Yeast Wet Prep HPF POC: NONE SEEN

## 2017-10-15 NOTE — MAU Note (Signed)
Pt reports constant sharp lower abdominal pain that started at 4pm. Pt unsure if water broke. Pt states she had a gush of clear fluid run down her leg around 5pm. Pt states that she just "feels more wet" than normal. Pt denies vaginal bleeding. Reports good fetal movement.

## 2017-10-15 NOTE — Discharge Instructions (Signed)
Braxton Hicks Contractions °Contractions of the uterus can occur throughout pregnancy, but they are not always a sign that you are in labor. You may have practice contractions called Braxton Hicks contractions. These false labor contractions are sometimes confused with true labor. °What are Braxton Hicks contractions? °Braxton Hicks contractions are tightening movements that occur in the muscles of the uterus before labor. Unlike true labor contractions, these contractions do not result in opening (dilation) and thinning of the cervix. Toward the end of pregnancy (32-34 weeks), Braxton Hicks contractions can happen more often and may become stronger. These contractions are sometimes difficult to tell apart from true labor because they can be very uncomfortable. You should not feel embarrassed if you go to the hospital with false labor. °Sometimes, the only way to tell if you are in true labor is for your health care provider to look for changes in the cervix. The health care provider will do a physical exam and may monitor your contractions. If you are not in true labor, the exam should show that your cervix is not dilating and your water has not broken. °If there are other health problems associated with your pregnancy, it is completely safe for you to be sent home with false labor. You may continue to have Braxton Hicks contractions until you go into true labor. °How to tell the difference between true labor and false labor °True labor °· Contractions last 30-70 seconds. °· Contractions become very regular. °· Discomfort is usually felt in the top of the uterus, and it spreads to the lower abdomen and low back. °· Contractions do not go away with walking. °· Contractions usually become more intense and increase in frequency. °· The cervix dilates and gets thinner. °False labor °· Contractions are usually shorter and not as strong as true labor contractions. °· Contractions are usually irregular. °· Contractions  are often felt in the front of the lower abdomen and in the groin. °· Contractions may go away when you walk around or change positions while lying down. °· Contractions get weaker and are shorter-lasting as time goes on. °· The cervix usually does not dilate or become thin. °Follow these instructions at home: °· Take over-the-counter and prescription medicines only as told by your health care provider. °· Keep up with your usual exercises and follow other instructions from your health care provider. °· Eat and drink lightly if you think you are going into labor. °· If Braxton Hicks contractions are making you uncomfortable: °? Change your position from lying down or resting to walking, or change from walking to resting. °? Sit and rest in a tub of warm water. °? Drink enough fluid to keep your urine pale yellow. Dehydration may cause these contractions. °? Do slow and deep breathing several times an hour. °· Keep all follow-up prenatal visits as told by your health care provider. This is important. °Contact a health care provider if: °· You have a fever. °· You have continuous pain in your abdomen. °Get help right away if: °· Your contractions become stronger, more regular, and closer together. °· You have fluid leaking or gushing from your vagina. °· You pass blood-tinged mucus (bloody show). °· You have bleeding from your vagina. °· You have low back pain that you never had before. °· You feel your baby’s head pushing down and causing pelvic pressure. °· Your baby is not moving inside you as much as it used to. °Summary °· Contractions that occur before labor are called Braxton   Hicks contractions, false labor, or practice contractions. °· Braxton Hicks contractions are usually shorter, weaker, farther apart, and less regular than true labor contractions. True labor contractions usually become progressively stronger and regular and they become more frequent. °· Manage discomfort from Braxton Hicks contractions by  changing position, resting in a warm bath, drinking plenty of water, or practicing deep breathing. °This information is not intended to replace advice given to you by your health care provider. Make sure you discuss any questions you have with your health care provider. °Document Released: 10/17/2016 Document Revised: 10/17/2016 Document Reviewed: 10/17/2016 °[employer] Interactive Patient Education © 2018 [employer] ° °

## 2017-10-15 NOTE — MAU Provider Note (Signed)
History  CSN: [id number] Arrival date and time: 10/15/17 2149  First Provider Initiated Contact with Patient 10/15/17 2233     HPI: Maureen Perez is a 32 y.o. [id number] with IUP at [redacted]w[redacted]d who presents to maternity admissions reporting lower abdominal pain/contractions and leakage of fluid. He reports feeling some clear fluid running down her leg around 5 pm, days it wasn't a lot, and has not had additional leakage. Denies vaginal bleeding. Reports that since this afternoon she is feeling sharp intermittent pain, like contraction. Good fetal movement. Denies  fevers, chills, malaise, dysuria, hematuria, urinary frequency, nausea, vomiting, diarrhea, RUQ/epigastric pain, dizziness/lighreadhess, or headache.   She receives [facility]. Pregnancy complicated by A1GDM, tobacco use.    OB History  Gravida Para Term Preterm AB Living  0 2 2  SAB TAB Ectopic Multiple Live Births  2 0 0 0 2    # Outcome Date GA Lbr Len/2nd Weight Sex Delivery Anes PTL Lv  5 Current           4 Term 08/09/13 [redacted]w[redacted]d 04:44 / 00:08 5 lb 9.6 oz (2.54 kg) F Vag-Spont EPI  LIV  3 Term 09/01/11 [redacted]w[redacted]d 21:43 / 01:13 6 lb 2.9 oz (2.805 kg) F Vag-Spont EPI  LIV     Birth Comments: No anomalies noted  2 SAB           1 SAB  [redacted]w[redacted]d          Past Medical History:  Diagnosis Date  . Abnormal Pap smear    repeat WNL  . Back pain   . Cholelithiasis   . Depression    no meds since pregancny  . Family history of adverse reaction to anesthesia    " MOTHER TAKES ALONG TIME TO WAKE "  . Gestational diabetes 08/20/2017   gestational - diet controlled  . Hearing loss    as a child - tubes in ear  . Infection    UTI  . Injury of tendon of left rotator cuff   . Obesity   . PONV (postoperative nausea and vomiting)    Past Surgical History:  Procedure Laterality Date  . CHOLECYSTECTOMY N/A 12/05/2014   Procedure: LAPAROSCOPIC CHOLECYSTECTOMY WITH INTRAOPERATIVE CHOLANGIOGRAM;  Surgeon: Chevis Pretty III, MD;  Location: [facility] OR;   Service: General;  Laterality: N/A;  . TONSILLECTOMY     age 9  . tubes in ears  age 27   Family History  Problem Relation Age of Onset  . Depression Mother   . Diabetes Mother   . Hypertension Mother   . COPD Mother   . Anesthesia problems Mother   . Depression Sister   . Diabetes Sister   . Kidney disease Sister   . Hypertension Sister   . Hypertension Father   . Anesthesia problems Father   . COPD Father    Social History   Socioeconomic History  . Marital status: Single    Spouse name: Not on file  . Number of children: Not on file  . Years of education: Not on file  . Highest education level: Not on file  Occupational History  . Not on file  Social Needs  . Financial resource strain: Not on file  . Food insecurity:    Worry: Not on file    Inability: Not on file  . Transportation needs:    Medical: Not on file    Non-medical: Not on file  Tobacco Use  . Smoking status: Current  Every Day Smoker    Packs/day: 0.50    Years: 10.00    Pack years: 5.00    Types: Cigarettes  . Smokeless tobacco: Never Used  Substance and Sexual Activity  . Alcohol use: No    Alcohol/week: 0.0 oz  . Drug use: No    Comment: not any more  . Sexual activity: Yes    Birth control/protection: None    Comment: last sex 21 September 2017  Lifestyle  . Physical activity:    Days per week: Not on file    Minutes per session: Not on file  . Stress: Not on file  Relationships  . Social connections:    Talks on phone: Not on file    Gets together: Not on file    Attends religious service: Not on file    Active member of club or organization: Not on file    Attends meetings of clubs or organizations: Not on file    Relationship status: Not on file  . Intimate partner violence:    Fear of current or ex partner: Not on file    Emotionally abused: Not on file    Physically abused: Not on file    Forced sexual activity: Not on file  Other Topics Concern  . Not on file  Social History  Narrative  . Not on file   Allergies  Allergen Reactions  . Cephalexin Itching and Nausea And Vomiting  . Percocet [Oxycodone-Acetaminophen] Nausea And Vomiting    Medications Prior to Admission  Medication Sig Dispense Refill Last Dose  . ACCU-CHEK FASTCLIX LANCETS MISC 1 Units by Percutaneous route 4 (four) times daily. 100 each 12 10/15/2017 at Unknown time  . acetaminophen (TYLENOL) 325 MG tablet Take 650 mg by mouth every 6 (six) hours as needed for mild pain or headache.   Past Week at Unknown time  . glucose blood test strip Use as instructed- check blood glucose four times per day. 100 each 12 10/15/2017 at Unknown time  . Prenatal Multivit-Min-Fe-FA (PRENATAL VITAMINS PO) Take 1 tablet by mouth daily.   10/15/2017 at Unknown time  . Elastic Bandages & Supports (COMFORT FIT MATERNITY SUPP MED) MISC 1 Device by Does not apply route daily. 1 each 0 Unknown at Unknown time    I have reviewed patient's Past Medical Hx, Surgical Hx, Family Hx, Social Hx, medications and allergies.   Review of Systems: Negative except for what is mentioned in HPI.  Physical Exam   Blood pressure 124/75, pulse 77, temperature 98.2 F (36.8 C), resp. rate 20, height  (1.549 m), weight 166 lb (75.3 kg), last menstrual period 01/22/2017, SpO2 100 %.  Constitutional: Well-developed, well-nourished female in no acute distress.  HENT: [place]/AT, normal oropharynx mucosa. MMM Eyes: normal conjunctivae, no scleral icterus Cardiovascular: normal rate, regular rhythm Respiratory: normal effort, lungs CTAB.  GI: Abd soft, non-tender, gravid appropriate for gestational age.   GU: Neg CVAT. Pelvic: NEFG. Normal vaginal mucosa without lesions. Cervix pink, visually closed, without lesion, scant whitish discharge. No pooling. No tenderness with bimanual exam. SVE 1cm/thick/bollatable  MSK: Extremities nontender, no edema Neurologic: Alert and oriented x 4. Psych: Normal mood and affect Skin: warm and dry   FHT:   Baseline 150 , moderate variability, accelerations present, no decelerations Toco: irregular ctx, UI  MAU Course/MDM:   Nursing notes and VS reviewed. Patient seen and examined, as noted above.  Reactive NST Negative ferning. Wet prep with moderate WBCs, otherwise negative. GC/Chlamydia probe sent  Assessment and Plan  Assessment: 1. Vaginal discharge during pregnancy in third trimester   2. False labor   3. NST (non-stress test) reactive     Plan: --Discharge home in stable condition.  --Labor precautions and fetal kick counts   Jacoby Zanni, Kandra Nicolas, MD 10/15/2017 11:35 PM

## 2017-10-16 ENCOUNTER — Ambulatory Visit (INDEPENDENT_AMBULATORY_CARE_PROVIDER_SITE_OTHER): Payer: Medicaid Other | Admitting: Obstetrics & Gynecology

## 2017-10-16 ENCOUNTER — Ambulatory Visit (HOSPITAL_COMMUNITY)
Admission: RE | Admit: 2017-10-16 | Discharge: 2017-10-16 | Disposition: A | Discharge status: Home / Self Care | Payer: Medicaid Other | Source: Ambulatory Visit | Attending: Obstetrics and Gynecology | Admitting: Obstetrics and Gynecology

## 2017-10-16 VITALS — BP 117/73 | HR 69 | Wt 163.9 lb

## 2017-10-16 DIAGNOSIS — O0993 Supervision of high risk pregnancy, unspecified, third trimester: Secondary | ICD-10-CM

## 2017-10-16 DIAGNOSIS — O24410 Gestational diabetes mellitus in pregnancy, diet controlled: Secondary | ICD-10-CM | POA: Diagnosis not present

## 2017-10-16 DIAGNOSIS — O99333 Smoking (tobacco) complicating pregnancy, third trimester: Secondary | ICD-10-CM | POA: Diagnosis present

## 2017-10-16 DIAGNOSIS — Z3A38 38 weeks gestation of pregnancy: Secondary | ICD-10-CM | POA: Diagnosis not present

## 2017-10-16 LAB — POCT URINALYSIS DIP (DEVICE)
BILIRUBIN URINE: NEGATIVE
Glucose, UA: NEGATIVE mg/dL
HGB URINE DIPSTICK: NEGATIVE
Ketones, ur: NEGATIVE mg/dL
NITRITE: NEGATIVE
Protein, ur: NEGATIVE mg/dL
Specific Gravity, Urine: 1.01 (ref 1.005–1.030)
Urobilinogen, UA: 0.2 mg/dL (ref 0.0–1.0)
pH: 7 (ref 5.0–8.0)

## 2017-10-16 LAB — GC/CHLAMYDIA PROBE AMP (~~LOC~~) NOT AT ARMC
CHLAMYDIA, DNA PROBE: NEGATIVE
Neisseria Gonorrhea: NEGATIVE

## 2017-10-16 NOTE — Addendum Note (Signed)
Addended by: Jaynie Collins A on: 10/16/2017 03:30 PM   Modules accepted: Orders, SmartSet

## 2017-10-16 NOTE — Progress Notes (Addendum)
PRENATAL VISIT NOTE  Subjective:  Maureen Perez is a 32 y.o. [id number] at [redacted]w[redacted]d being seen today for ongoing prenatal care.  She is currently monitored for the following issues for this high-risk pregnancy and has OBESITY; DEPRESSION, MODERATE, RECURRENT; TOBACCO ABUSE; TINNITUS, CHRONIC, BILATERAL; ALLERGIC RHINITIS DUE TO ANIMAL HAIR AND DANDER; INSOMNIA; Tobacco use disorder affecting pregnancy, antepartum; GERD (gastroesophageal reflux disease); Supervision of high risk pregnancy, antepartum, third trimester; Rampant dental caries; GDM, class A1; and Preterm uterine contractions in third trimester, antepartum on their problem list.  Patient reports occasional contractions and some ?LOF. Has been evaluated several times for this in MAU including last night, negative ROM evaluation. Desires IOL as soon as possible.  Contractions: Irregular. Vag. Bleeding: None.  Movement: Present. Denies leaking of fluid.   The following portions of the patient's history were reviewed and updated as appropriate: allergies, current medications, past family history, past medical history, past social history, past surgical history and problem list. Problem list updated.  Objective:   Vitals:   10/16/17 1445  BP: 117/73  Pulse: 69  Weight: 163 lb 14.4 oz (74.3 kg)    Fetal Status: Fetal Heart Rate (bpm): U/S   Movement: Present     General:  Alert, oriented and cooperative. Patient is in no acute distress.  Skin: Skin is warm and dry. No rash noted.   Cardiovascular: Normal heart rate noted  Respiratory: Normal respiratory effort, no problems with respiration noted  Abdomen: Soft, gravid, appropriate for gestational age.  Pain/Pressure: Present     Pelvic: Cervical exam deferred        Extremities: Normal range of motion.  Edema: None  Mental Status: Normal mood and affect. Normal behavior. Normal judgment and thought content.   [place] Mfm Fetal Bpp Wo Non Stress  Result Date:  10/06/2017 ----------------------------------------------------------------------  OBSTETRICS REPORT                      (Signed Final 10/06/2017 11:15 pm) ---------------------------------------------------------------------- Patient Info  ID #:       [id number]                          D.O.B.:  January 03, 1986 (31 yrs)  Name:       Maureen Perez               Visit Date: 10/06/2017 02:26 pm ---------------------------------------------------------------------- Performed By  Performed By:     Marcellina Millin          Secondary Phy.:    [facility]                                                              [facility]  Healthcare  Attending:        Particia Nearing MD       Address:           [facility]                                                              [address]                                                              [place], [place]                                                              [address]  Referred By:      Adam Phenix         Location:          [facility]                    MD  Ref. Address:     [address]                    [place], [place]                    [address] ---------------------------------------------------------------------- Orders   #  Description                                 Code   1  [place] MFM FETAL BPP WO NON STRESS              09811.91  ----------------------------------------------------------------------   #  Ordered By               Order #        Accession #    Episode #   1  Judeth Horn            [id number]      [id number]     [id number]  ----------------------------------------------------------------------  Indications   [redacted] weeks  gestation of pregnancy                Z3A.36   Decreased fetal movements, third trimester,    O36.8130   unspecified  ---------------------------------------------------------------------- OB History  Gravidity:    5         Term:   2         SAB:   2  Living:       2 ---------------------------------------------------------------------- Fetal Evaluation  Num Of Fetuses:     1  Fetal Heart         132  Rate(bpm):  Cardiac Activity:   Observed  Presentation:       Cephalic  Amniotic Fluid  AFI FV:      Subjectively low-normal  AFI Sum(cm)     %Tile       Largest Pocket(cm)  7.7             6           2.76  RUQ(cm)       RLQ(cm)       LUQ(cm)        LLQ(cm)  2.76          1.29          1.85           1.8  Comment:    8/8 BPP in 10 minutes. ---------------------------------------------------------------------- Biophysical Evaluation  Amniotic F.V:   Within normal limits       F. Tone:         Observed  F. Movement:    Observed                   Score:           8/8  F. Breathing:   Observed ---------------------------------------------------------------------- Gestational Age  LMP:           36w 5d        Date:  01/22/17                 EDD:   10/29/17  Best:          36w 5d     Det. By:  LMP  (01/22/17)          EDD:   10/29/17 ---------------------------------------------------------------------- Impression  SIUP at 36+5 weeks  Cephalic presentation  Low normal amniotic fluid volume  BPP 8/8 ---------------------------------------------------------------------- Recommendations  Follow-up as clinically indicated ----------------------------------------------------------------------                 Particia Nearing, MD Electronically Signed Final Report   10/06/2017 11:15 pm ----------------------------------------------------------------------  [place] Mfm Ob Follow Up  Result Date: 10/16/2017 ----------------------------------------------------------------------  OBSTETRICS REPORT                      (Signed Final  10/16/2017 02:23 pm) ---------------------------------------------------------------------- Patient Info  ID #:       [id number]                          D.O.B.:  04-Jan-1986 (31 yrs)  Name:       Maureen Perez               Visit Date: 10/16/2017 01:21 pm ---------------------------------------------------------------------- Performed By  Performed By:     Emeline Darling BS,      Secondary Phy.:   [facility]                                                             [facility]  Attending:        Charlsie Merles MD         Address:          [facility]                                                             [address]                                                             [place], [place]                                                             [address]  Referred By:      Adam Phenix         Location:         [facility]                    MD  Ref. Address:     [address]  [address]                    [place], [place]                    [address] ---------------------------------------------------------------------- Orders   #  Description                                 Code   1  [place] MFM OB FOLLOW UP                         [address].09  ----------------------------------------------------------------------   #  Ordered By               Order #        Accession #    Episode #   1  Butte Bing          [id number]      [id number]     [id number]  ---------------------------------------------------------------------- Indications   [redacted] weeks gestation of pregnancy                Z3A.38   Smoking complicating pregnancy, second         O99.332   trimester   Gestational diabetes in pregnancy,  diet        O24.410   controlled  ---------------------------------------------------------------------- OB History  Blood Type:            Height:  5'1"   Weight (lb):  166       BMI:  31.36  Gravidity:    5         Term:   2         SAB:   2  Living:       2 ---------------------------------------------------------------------- Fetal Evaluation  Num Of Fetuses:     1  Fetal Heart         140  Rate(bpm):  Cardiac Activity:   Observed  Presentation:       Cephalic  Placenta:           Posterior, above cervical os  P. Cord Insertion:  Previously Visualized  Amniotic Fluid  AFI FV:      Subjectively within normal limits  AFI Sum(cm)     %Tile       Largest Pocket(cm)  9.59            23          4.49  RUQ(cm)       RLQ(cm)                      LLQ(cm)  4.49          3.14                         1.96 ---------------------------------------------------------------------- Biometry  BPD:      84.9  mm     G. Age:  34w 1d          1  %    CI:        68.92   %    70 - 86  FL/HC:      21.4   %    20.9 - 22.7  HC:      326.7  mm     G. Age:  37w 0d         12  %    HC/AC:      0.98        0.92 - 1.05  AC:      333.2  mm     G. Age:  37w 2d         43  %    FL/BPD:     82.2   %    71 - 87  FL:       69.8  mm     G. Age:  35w 6d          8  %    FL/AC:      20.9   %    20 - 24  Est. FW:    2957  gm      6 lb 8 oz     41  % ---------------------------------------------------------------------- Gestational Age  LMP:           38w 1d        Date:  01/22/17                 EDD:   10/29/17  U/S Today:     36w 1d                                        EDD:   11/12/17  Best:          38w 1d     Det. By:  LMP  (01/22/17)          EDD:   10/29/17 ---------------------------------------------------------------------- Anatomy  Cranium:               Appears normal         Aortic Arch:            Previously seen  Cavum:                 Previously seen        Ductal Arch:             Previously seen  Ventricles:            Appears normal         Diaphragm:              Appears normal  Choroid Plexus:        Previously seen        Stomach:                Previously Seen  Cerebellum:            Previously seen        Abdomen:                Appears normal  Posterior Fossa:       Previously seen        Abdominal Wall:         Previously seen  Nuchal Fold:           Previously seen        Cord Vessels:           Previously seen  Face:  Orbits and profile     Kidneys:                Appear normal                         previously seen  Lips:                  Previously seen        Bladder:                Appears normal  Thoracic:              Appears normal         Spine:                  Previously seen  Heart:                 Appears normal         Upper Extremities:      Previously seen                         (4CH, axis, and situs  RVOT:                  Previously seen        Lower Extremities:      Previously seen  LVOT:                  Previously seen  Other:  Female gender previously seen. Heels and 5th digit previously seen. ---------------------------------------------------------------------- Cervix Uterus Adnexa  Cervix  Not visualized (advanced GA >29wks) ---------------------------------------------------------------------- Impression  Singleton intrauterine pregnancy at 38+1 weeks with  borderline fluid last week here for repeat evaluation and  growth scan  Interval review of the anatomy shows no sonographic  markers for aneuploidy or structural anomalies  All relevant fetal anatomy has been visualized  Amniotic fluid volume is normal  Estimated fetal weight shows growth in the 41st percentile ---------------------------------------------------------------------- Recommendations  Follow-up ultrasounds as clinically indicated. ----------------------------------------------------------------------                 Charlsie Merles, MD Electronically Signed Final Report    10/16/2017 02:23 pm ----------------------------------------------------------------------   Assessment and Plan:  Pregnancy: [id number] at [redacted]w[redacted]d  1. GDM, class A1 EFW today 41%, normal AFV. Did not bring log book; also did this last time. Given inability to know glycemic control, IOL scheduled at 39 weeks (10/23/17 at midnight)  2. Supervision of high risk pregnancy, antepartum, third trimester Term labor symptoms and general obstetric precautions including but not limited to vaginal bleeding, contractions, leaking of fluid and fetal movement were reviewed in detail with the patient. Please refer to After Visit Summary for other counseling recommendations.  Return in about 7 weeks (around 12/04/2017) for 2 hr GTT, Postpartum check.   Jaynie Collins, MD

## 2017-10-16 NOTE — Patient Instructions (Addendum)
Return to clinic for any scheduled appointments or obstetric concerns, or go to MAU for evaluation  Induction of labor at midnight on 10/23/17 (Wednesday late night)    Labor Induction Labor induction is when steps are taken to cause a pregnant woman to begin the labor process. Most women go into labor on their own between 37 weeks and 42 weeks of the pregnancy. When this does not happen or when there is a medical need, methods may be used to induce labor. Labor induction causes a pregnant woman's uterus to contract. It also causes the cervix to soften (ripen), open (dilate), and thin out (efface). Usually, labor is not induced before 39 weeks of the pregnancy unless there is a problem with the baby or mother. Before inducing labor, your health care provider will consider a number of factors, including the following:  The medical condition of you and the baby.  How many weeks along you are.  The status of the baby's lung maturity.  The condition of the cervix.  The position of the baby.  What are the reasons for labor induction? Labor may be induced for the following reasons:  The health of the baby or mother is at risk.  The pregnancy is overdue by 1 week or more.  The water breaks but labor does not start on its own.  The mother has a health condition or serious illness, such as high blood pressure, infection, placental abruption, or diabetes.  The amniotic fluid amounts are low around the baby.  The baby is distressed.  Convenience or wanting the baby to be born on a certain date is not a reason for inducing labor. What methods are used for labor induction? Several methods of labor induction may be used, such as:  Prostaglandin medicine. This medicine causes the cervix to dilate and ripen. The medicine will also start contractions. It can be taken by mouth or by inserting a suppository into the vagina.  Inserting a thin tube (catheter) with a balloon on the end into the  vagina to dilate the cervix. Once inserted, the balloon is expanded with water, which causes the cervix to open.  Stripping the membranes. Your health care provider separates amniotic sac tissue from the cervix, causing the cervix to be stretched and causing the release of a hormone called progesterone. This may cause the uterus to contract. It is often done during an office visit. You will be sent home to wait for the contractions to begin. You will then come in for an induction.  Breaking the water. Your health care provider makes a hole in the amniotic sac using a small instrument. Once the amniotic sac breaks, contractions should begin. This may still take hours to see an effect.  Medicine to trigger or strengthen contractions. This medicine is given through an IV access tube inserted into a vein in your arm.  All of the methods of induction, besides stripping the membranes, will be done in the hospital. Induction is done in the hospital so that you and the baby can be carefully monitored. How long does it take for labor to be induced? Some inductions can take up to 2-3 days. Depending on the cervix, it usually takes less time. It takes longer when you are induced early in the pregnancy or if this is your first pregnancy. If a mother is still pregnant and the induction has been going on for 2-3 days, either the mother will be sent home or a cesarean delivery will be needed.  What are the risks associated with labor induction? Some of the risks of induction include:  Changes in fetal heart rate, such as too high, too low, or erratic.  Fetal distress.  Chance of infection for the mother and baby.  Increased chance of having a cesarean delivery.  Breaking off (abruption) of the placenta from the uterus (rare).  Uterine rupture (very rare).  When induction is needed for medical reasons, the benefits of induction may outweigh the risks. What are some reasons for not inducing labor? Labor  induction should not be done if:  It is shown that your baby does not tolerate labor.  You have had previous surgeries on your uterus, such as a myomectomy or the removal of fibroids.  Your placenta lies very low in the uterus and blocks the opening of the cervix (placenta previa).  Your baby is not in a head-down position.  The umbilical cord drops down into the birth canal in front of the baby. This could cut off the baby's blood and oxygen supply.  You have had a previous cesarean delivery.  There are unusual circumstances, such as the baby being extremely premature.  This information is not intended to replace advice given to you by your health care provider. Make sure you discuss any questions you have with your health care provider. Document Released: 10/23/2006 Document Revised: 11/09/2015 Document Reviewed: 12/31/2012 Elsevier Interactive Patient Education  2017 [employer].

## 2017-10-17 ENCOUNTER — Encounter (HOSPITAL_COMMUNITY): Payer: Self-pay | Admitting: *Deleted

## 2017-10-17 ENCOUNTER — Telehealth (HOSPITAL_COMMUNITY): Payer: Self-pay | Admitting: *Deleted

## 2017-10-17 NOTE — Telephone Encounter (Signed)
Preadmission screen  

## 2017-10-21 ENCOUNTER — Encounter: Payer: Self-pay | Admitting: *Deleted

## 2017-10-22 NOTE — H&P (Signed)
Obstetric History and Physical  Maureen Perez is a 32 y.o. [phone] with IUP at [redacted]w[redacted]d presenting for IOL for A1GDM(unsure of CBG control as patient did not keep log book). Patient states she has been having  Irregular contractions, none vaginal bleeding, intact membranes, with active fetal movement.  No blurry vision, headaches or peripheral edema, and RUQ pain.   Prenatal Course Source of Care: [facility] with onset of care at 11 weeks Dating: By LMP c/w [place] --->  Estimated Date of Delivery: 10/29/17 Pregnancy complications or risks: Patient Active Problem List   Diagnosis Date Noted  . Preterm uterine contractions in third trimester, antepartum 09/28/2017  . GDM, class A1 08/20/2017  . Supervision of high risk pregnancy, antepartum, third trimester 04/10/2017  . Rampant dental caries 04/10/2017  . GERD (gastroesophageal reflux disease) 06/15/2013  . Tobacco use disorder affecting pregnancy, antepartum 01/12/2013  . Rubella non-immune status, antepartum 12/24/2012  . DEPRESSION, MODERATE, RECURRENT 03/20/2010  . 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 INSOMNIA 01/06/2009  . TOBACCO ABUSE 11/22/2008  . ALLERGIC RHINITIS DUE TO ANIMAL HAIR AND DANDER 11/22/2008  . OBESITY 10/20/2008   She plans to breastfeed She desires bilateral tubal ligation for postpartum contraception.   Sono:    , CWD, normal anatomy, cephalic presentation, posterior placenta, 2957g, 41% EFW  Prenatal labs and studies: ABO, Rh: O/Positive/-- (10/25 1308) Antibody: Negative (10/25 0937) Rubella: <0.90 (10/25 0937) RPR: Non Reactive (02/26 0841)  HBsAg: Negative (10/25 0937)  HIV: Non Reactive (02/26 0841)  GBS: Negative 2 hr Glucola abnormal Genetic screening normal Anatomy US normal  Prenatal Transfer Tool  Maternal Diabetes: Yes:  Diabetes Type:  Diet controlled Genetic Screening: Normal Maternal Ultrasounds/Referrals: Normal Fetal Ultrasounds or other Referrals:  None Maternal Substance  Abuse:  No Significant Maternal Medications:  None Significant Maternal Lab Results: None  Past Medical History:  Diagnosis Date  . Abnormal Pap smear    repeat WNL  . Back pain   . Cholelithiasis   . Depression    no meds since pregancny  . Family history of adverse reaction to anesthesia    " MOTHER TAKES ALONG TIME TO WAKE "  . Gestational diabetes 08/20/2017   gestational - diet controlled  . Hearing loss    as a child - tubes in ear  . Infection    UTI  . Injury of tendon of left rotator cuff   . Obesity   . PONV (postoperative nausea and vomiting)   . Vaginal Pap smear, abnormal     Past Surgical History:  Procedure Laterality Date  . CHOLECYSTECTOMY N/A 12/05/2014   Procedure: LAPAROSCOPIC CHOLECYSTECTOMY WITH INTRAOPERATIVE CHOLANGIOGRAM;  Surgeon: Chevis Pretty III, MD;  Location: [facility] OR;  Service: General;  Laterality: N/A;  . TONSILLECTOMY     age 75  . tubes in ears  age 30    OB History  Gravida Para Term Preterm AB Living  0 2 2  SAB TAB Ectopic Multiple Live Births  2 0 0 0 2    # Outcome Date GA Lbr Len/2nd Weight Sex Delivery Anes PTL Lv  5 Current           4 Term 08/09/13 [redacted]w[redacted]d 04:44 / 00:08 2.54 kg (5 lb 9.6 oz) F Vag-Spont EPI  LIV  3 Term 09/01/11 [redacted]w[redacted]d 21:43 / 01:13 2.805 kg (6 lb 2.9 oz) F Vag-Spont EPI  LIV     Birth Comments: No anomalies noted  2 SAB  1 SAB  [redacted]w[redacted]d           Social History   Socioeconomic History  . Marital status: Single    Spouse name: Not on file  . Number of children: Not on file  . Years of education: Not on file  . Highest education level: Not on file  Occupational History  . Not on file  Social Needs  . Financial resource strain: Not on file  . Food insecurity:    Worry: Not on file    Inability: Not on file  . Transportation needs:    Medical: Not on file    Non-medical: Not on file  Tobacco Use  . Smoking status: Current Every Day Smoker    Packs/day: 0.50    Years: 10.00    Pack years:  5.00    Types: Cigarettes  . Smokeless tobacco: Never Used  Substance and Sexual Activity  . Alcohol use: No    Alcohol/week: 0.0 oz  . Drug use: No    Comment: not any more  . Sexual activity: Yes    Birth control/protection: None    Comment: last sex 21 September 2017  Lifestyle  . Physical activity:    Days per week: Not on file    Minutes per session: Not on file  . Stress: Not on file  Relationships  . Social connections:    Talks on phone: Not on file    Gets together: Not on file    Attends religious service: Not on file    Active member of club or organization: Not on file    Attends meetings of clubs or organizations: Not on file    Relationship status: Not on file  Other Topics Concern  . Not on file  Social History Narrative  . Not on file    Family History  Problem Relation Age of Onset  . Depression Mother   . Diabetes Mother   . Hypertension Mother   . COPD Mother   . Anesthesia problems Mother        problems waking and nausea and vomitting  . Depression Sister   . Diabetes Sister   . Kidney disease Sister   . Hypertension Sister   . Hypertension Father   . COPD Father   . Anesthesia problems Father        N&V     (Not in a hospital admission)  Allergies  Allergen Reactions  . Cephalexin Itching and Nausea And Vomiting  . Percocet [Oxycodone-Acetaminophen] Nausea And Vomiting    Review of Systems: Negative except for what is mentioned in HPI.  Physical Exam: LMP 01/22/2017 (Exact Date)  CONSTITUTIONAL: Well-developed, well-nourished female in no acute distress.  HENT:  Normocephalic, atraumatic, External right and left ear normal. Oropharynx is clear and moist EYES: Conjunctivae and EOM are normal. Pupils are equal, round, and reactive to light. No scleral icterus.  NECK: Normal range of motion, supple, no masses SKIN: Skin is warm and dry. No rash noted. Not diaphoretic. No erythema. No pallor. NEUROLOGIC: Alert and oriented to person,  place, and time. Normal reflexes, muscle tone coordination. No cranial nerve deficit noted. PSYCHIATRIC: Normal mood and affect. Normal behavior. Normal judgment and thought content. CARDIOVASCULAR: Normal heart rate noted, regular rhythm RESPIRATORY: Effort and breath sounds normal, no problems with respiration noted ABDOMEN: Soft, nontender, nondistended, gravid. MUSCULOSKELETAL: Normal range of motion. No edema and no tenderness. 2+ distal pulses.  Cervical Exam: Dilation: 3 Effacement (%): 50 Cervical Position: Middle Station: -  3 Presentation: Vertex Exam by:: Dr.Phelps FHT:  Baseline rate 145 bpm   Variability moderate  Accelerations present   Decelerations none Contractions: UI   Pertinent Labs/Studies:   No results found for this or any previous visit (from the past 24 hour(s)).  Assessment : Maureen Perez is a 32 y.o. [id number] at [redacted]w[redacted]d being admitted for induction of labor due to A1GDM.  Plan: Labor: Induction of labor with Pitocin. Cervix is ripe.  A1GDM: Monitor CBGs q4hrs; q2hrs when in active labor.  Analgesia as needed. FWB: Reassuring fetal heart tracing.  GBS negative Delivery plan: Hopeful for vaginal delivery   Caryl Ada, DO OB Fellow Faculty Practice, [facility] [place] 10/22/2017, 11:32 PM

## 2017-10-23 ENCOUNTER — Inpatient Hospital Stay (HOSPITAL_COMMUNITY): Payer: Medicaid Other | Admitting: Anesthesiology

## 2017-10-23 ENCOUNTER — Encounter: Payer: Medicaid Other | Admitting: Obstetrics and Gynecology

## 2017-10-23 ENCOUNTER — Encounter (HOSPITAL_COMMUNITY): Payer: Self-pay

## 2017-10-23 ENCOUNTER — Encounter (HOSPITAL_COMMUNITY)
Admission: RE | Disposition: A | Discharge status: Home / Self Care | Payer: Self-pay | Source: Ambulatory Visit | Attending: Obstetrics & Gynecology

## 2017-10-23 ENCOUNTER — Inpatient Hospital Stay (HOSPITAL_COMMUNITY)
Admission: RE | Admit: 2017-10-23 | Discharge: 2017-10-25 | DRG: 798 | Disposition: A | Discharge status: Home / Self Care | Payer: Medicaid Other | Source: Ambulatory Visit | Attending: Obstetrics & Gynecology | Admitting: Obstetrics & Gynecology

## 2017-10-23 DIAGNOSIS — O99334 Smoking (tobacco) complicating childbirth: Secondary | ICD-10-CM | POA: Diagnosis present

## 2017-10-23 DIAGNOSIS — O9902 Anemia complicating childbirth: Secondary | ICD-10-CM | POA: Diagnosis present

## 2017-10-23 DIAGNOSIS — Z302 Encounter for sterilization: Secondary | ICD-10-CM

## 2017-10-23 DIAGNOSIS — F17210 Nicotine dependence, cigarettes, uncomplicated: Secondary | ICD-10-CM | POA: Diagnosis present

## 2017-10-23 DIAGNOSIS — D649 Anemia, unspecified: Secondary | ICD-10-CM | POA: Diagnosis present

## 2017-10-23 DIAGNOSIS — Z23 Encounter for immunization: Secondary | ICD-10-CM | POA: Diagnosis not present

## 2017-10-23 DIAGNOSIS — O24429 Gestational diabetes mellitus in childbirth, unspecified control: Secondary | ICD-10-CM

## 2017-10-23 DIAGNOSIS — Z3A39 39 weeks gestation of pregnancy: Secondary | ICD-10-CM

## 2017-10-23 DIAGNOSIS — O24420 Gestational diabetes mellitus in childbirth, diet controlled: Principal | ICD-10-CM | POA: Diagnosis present

## 2017-10-23 HISTORY — PX: TUBAL LIGATION: SHX77

## 2017-10-23 LAB — COMPREHENSIVE METABOLIC PANEL
ALT: 8 U/L — ABNORMAL LOW (ref 14–54)
ANION GAP: 12 (ref 5–15)
AST: 17 U/L (ref 15–41)
Albumin: 3.2 g/dL — ABNORMAL LOW (ref 3.5–5.0)
Alkaline Phosphatase: 183 U/L — ABNORMAL HIGH (ref 38–126)
BILIRUBIN TOTAL: 0.5 mg/dL (ref 0.3–1.2)
BUN: 8 mg/dL (ref 6–20)
CHLORIDE: 104 mmol/L (ref 101–111)
CO2: 20 mmol/L — ABNORMAL LOW (ref 22–32)
Calcium: 8.8 mg/dL — ABNORMAL LOW (ref 8.9–10.3)
Creatinine, Ser: 0.65 mg/dL (ref 0.44–1.00)
Glucose, Bld: 106 mg/dL — ABNORMAL HIGH (ref 65–99)
POTASSIUM: 3.7 mmol/L (ref 3.5–5.1)
Sodium: 136 mmol/L (ref 135–145)
Total Protein: 6.1 g/dL — ABNORMAL LOW (ref 6.5–8.1)

## 2017-10-23 LAB — CBC
HEMATOCRIT: 35.6 % — AB (ref 36.0–46.0)
HEMATOCRIT: 35.6 % — AB (ref 36.0–46.0)
HEMOGLOBIN: 11.5 g/dL — AB (ref 12.0–15.0)
Hemoglobin: 11.7 g/dL — ABNORMAL LOW (ref 12.0–15.0)
MCH: 29.8 pg (ref 26.0–34.0)
MCH: 29.9 pg (ref 26.0–34.0)
MCHC: 32.9 g/dL (ref 30.0–36.0)
MCHC: 32.3 g/dL (ref 30.0–36.0)
MCV: 90.8 fL (ref 78.0–100.0)
MCV: 92.7 fL (ref 78.0–100.0)
PLATELETS: 196 10*3/uL (ref 150–400)
Platelets: 164 10*3/uL (ref 150–400)
RBC: 3.92 MIL/uL (ref 3.87–5.11)
RBC: 3.84 MIL/uL — AB (ref 3.87–5.11)
RDW: 14.6 % (ref 11.5–15.5)
RDW: 14.5 % (ref 11.5–15.5)
WBC: 11.1 10*3/uL — AB (ref 4.0–10.5)
WBC: 11.7 10*3/uL — AB (ref 4.0–10.5)

## 2017-10-23 LAB — TYPE AND SCREEN
ABO/RH(D): O POS
ANTIBODY SCREEN: NEGATIVE

## 2017-10-23 LAB — GLUCOSE, CAPILLARY
GLUCOSE-CAPILLARY: 76 mg/dL (ref 65–99)
Glucose-Capillary: 105 mg/dL — ABNORMAL HIGH (ref 65–99)
Glucose-Capillary: 88 mg/dL (ref 65–99)
Glucose-Capillary: 72 mg/dL (ref 65–99)

## 2017-10-23 LAB — RPR: RPR: NONREACTIVE

## 2017-10-23 SURGERY — LIGATION, FALLOPIAN TUBE, POSTPARTUM
Anesthesia: Epidural | Laterality: Bilateral

## 2017-10-23 MED ORDER — LIDOCAINE HCL (PF) 1 % IJ SOLN
INTRAMUSCULAR | Status: DC | PRN
  Administered 2017-10-23: 3 mL via EPIDURAL
  Administered 2017-10-23: 2 mL via EPIDURAL
  Administered 2017-10-23: 5 mL via EPIDURAL

## 2017-10-23 MED ORDER — OXYCODONE-ACETAMINOPHEN 5-325 MG PO TABS: 1.0000 | ORAL_TABLET | ORAL | Status: DC | PRN

## 2017-10-23 MED ORDER — WITCH HAZEL-GLYCERIN EX PADS: 1.0000 "application " | MEDICATED_PAD | CUTANEOUS | Status: DC | PRN

## 2017-10-23 MED ORDER — FAMOTIDINE 20 MG PO TABS
20.0000 mg | ORAL_TABLET | Freq: Once | ORAL | Status: DC
  Filled 2017-10-23 (×2): qty 1

## 2017-10-23 MED ORDER — LACTATED RINGERS IV SOLN: 500.0000 mL | Freq: Once | INTRAVENOUS | Status: DC

## 2017-10-23 MED ORDER — MIDAZOLAM HCL 2 MG/2ML IJ SOLN
INTRAMUSCULAR | Status: AC
  Filled 2017-10-23: qty 2

## 2017-10-23 MED ORDER — DIBUCAINE 1 % RE OINT: 1.0000 "application " | TOPICAL_OINTMENT | RECTAL | Status: DC | PRN

## 2017-10-23 MED ORDER — TERBUTALINE SULFATE 1 MG/ML IJ SOLN
0.2500 mg | Freq: Once | INTRAMUSCULAR | Status: DC | PRN
  Filled 2017-10-23: qty 1

## 2017-10-23 MED ORDER — METOCLOPRAMIDE HCL 5 MG/ML IJ SOLN
10.0000 mg | Freq: Once | INTRAMUSCULAR | Status: DC
  Filled 2017-10-23: qty 2

## 2017-10-23 MED ORDER — FENTANYL CITRATE (PF) 100 MCG/2ML IJ SOLN: 50.0000 ug | INTRAMUSCULAR | Status: DC | PRN

## 2017-10-23 MED ORDER — ONDANSETRON HCL 4 MG/2ML IJ SOLN: 4.0000 mg | INTRAMUSCULAR | Status: DC | PRN

## 2017-10-23 MED ORDER — ONDANSETRON HCL 4 MG/2ML IJ SOLN
INTRAMUSCULAR | Status: DC | PRN
  Administered 2017-10-23: 4 mg via INTRAVENOUS

## 2017-10-23 MED ORDER — OXYTOCIN 40 UNITS IN LACTATED RINGERS INFUSION - SIMPLE MED: 2.5000 [IU]/h | INTRAVENOUS | Status: DC

## 2017-10-23 MED ORDER — ACETAMINOPHEN 325 MG PO TABS: 650.0000 mg | ORAL_TABLET | ORAL | Status: DC | PRN

## 2017-10-23 MED ORDER — SOD CITRATE-CITRIC ACID 500-334 MG/5ML PO SOLN: 30.0000 mL | ORAL | Status: DC | PRN

## 2017-10-23 MED ORDER — LIDOCAINE HCL (PF) 1 % IJ SOLN
30.0000 mL | INTRAMUSCULAR | Status: DC | PRN
  Filled 2017-10-23: qty 30

## 2017-10-23 MED ORDER — LIDOCAINE-EPINEPHRINE (PF) 2 %-1:200000 IJ SOLN
INTRAMUSCULAR | Status: AC
  Filled 2017-10-23: qty 20

## 2017-10-23 MED ORDER — PRENATAL MULTIVITAMIN CH
1.0000 | ORAL_TABLET | Freq: Every day | ORAL | Status: DC
Start: 2017-10-24 — End: 2017-10-25
  Administered 2017-10-24 – 2017-10-25 (×2): 1 via ORAL
  Filled 2017-10-23 (×2): qty 1

## 2017-10-23 MED ORDER — PHENYLEPHRINE 40 MCG/ML (10ML) SYRINGE FOR IV PUSH (FOR BLOOD PRESSURE SUPPORT)
80.0000 ug | PREFILLED_SYRINGE | INTRAVENOUS | Status: DC | PRN
  Filled 2017-10-23: qty 5

## 2017-10-23 MED ORDER — PROMETHAZINE HCL 25 MG/ML IJ SOLN: 6.2500 mg | INTRAMUSCULAR | Status: DC | PRN

## 2017-10-23 MED ORDER — ONDANSETRON HCL 4 MG/2ML IJ SOLN: 4.0000 mg | Freq: Four times a day (QID) | INTRAMUSCULAR | Status: DC | PRN

## 2017-10-23 MED ORDER — BUPIVACAINE HCL (PF) 0.25 % IJ SOLN
INTRAMUSCULAR | Status: AC
  Filled 2017-10-23: qty 30

## 2017-10-23 MED ORDER — SENNOSIDES-DOCUSATE SODIUM 8.6-50 MG PO TABS
2.0000 | ORAL_TABLET | ORAL | Status: DC
  Administered 2017-10-23 – 2017-10-24 (×2): 2 via ORAL
  Filled 2017-10-23 (×2): qty 2

## 2017-10-23 MED ORDER — OXYCODONE-ACETAMINOPHEN 5-325 MG PO TABS: 2.0000 | ORAL_TABLET | ORAL | Status: DC | PRN

## 2017-10-23 MED ORDER — OXYTOCIN BOLUS FROM INFUSION: 500.0000 mL | Freq: Once | INTRAVENOUS | Status: DC

## 2017-10-23 MED ORDER — ZOLPIDEM TARTRATE 5 MG PO TABS: 5.0000 mg | ORAL_TABLET | Freq: Every evening | ORAL | Status: DC | PRN

## 2017-10-23 MED ORDER — EPHEDRINE 5 MG/ML INJ
10.0000 mg | INTRAVENOUS | Status: DC | PRN
  Filled 2017-10-23: qty 2

## 2017-10-23 MED ORDER — LACTATED RINGERS IV SOLN
INTRAVENOUS | Status: DC
  Administered 2017-10-23: 17:00:00 via INTRAVENOUS

## 2017-10-23 MED ORDER — DIPHENHYDRAMINE HCL 50 MG/ML IJ SOLN: 12.5000 mg | INTRAMUSCULAR | Status: DC | PRN

## 2017-10-23 MED ORDER — BENZOCAINE-MENTHOL 20-0.5 % EX AERO: 1.0000 "application " | INHALATION_SPRAY | CUTANEOUS | Status: DC | PRN

## 2017-10-23 MED ORDER — FAMOTIDINE IN NACL 20-0.9 MG/50ML-% IV SOLN
20.0000 mg | Freq: Once | INTRAVENOUS | Status: DC
  Filled 2017-10-23: qty 50

## 2017-10-23 MED ORDER — PHENYLEPHRINE 40 MCG/ML (10ML) SYRINGE FOR IV PUSH (FOR BLOOD PRESSURE SUPPORT)
80.0000 ug | PREFILLED_SYRINGE | INTRAVENOUS | Status: DC | PRN
  Filled 2017-10-23: qty 10
  Filled 2017-10-23: qty 5

## 2017-10-23 MED ORDER — LACTATED RINGERS IV SOLN
INTRAVENOUS | Status: DC
  Administered 2017-10-23: 01:00:00 via INTRAVENOUS

## 2017-10-23 MED ORDER — SIMETHICONE 80 MG PO CHEW: 80.0000 mg | CHEWABLE_TABLET | ORAL | Status: DC | PRN

## 2017-10-23 MED ORDER — FENTANYL CITRATE (PF) 100 MCG/2ML IJ SOLN
INTRAMUSCULAR | Status: AC
  Filled 2017-10-23: qty 2

## 2017-10-23 MED ORDER — OXYTOCIN 40 UNITS IN LACTATED RINGERS INFUSION - SIMPLE MED
1.0000 m[IU]/min | INTRAVENOUS | Status: DC
  Filled 2017-10-23: qty 1000

## 2017-10-23 MED ORDER — BUPIVACAINE HCL (PF) 0.25 % IJ SOLN
INTRAMUSCULAR | Status: DC | PRN
  Administered 2017-10-23: 10 mL

## 2017-10-23 MED ORDER — SODIUM BICARBONATE 8.4 % IV SOLN
INTRAVENOUS | Status: AC
  Filled 2017-10-23: qty 50

## 2017-10-23 MED ORDER — LACTATED RINGERS IV SOLN: 500.0000 mL | INTRAVENOUS | Status: DC | PRN

## 2017-10-23 MED ORDER — FENTANYL CITRATE (PF) 100 MCG/2ML IJ SOLN
25.0000 ug | INTRAMUSCULAR | Status: DC | PRN
  Administered 2017-10-23 (×2): 25 ug via INTRAVENOUS

## 2017-10-23 MED ORDER — FENTANYL 2.5 MCG/ML BUPIVACAINE 1/10 % EPIDURAL INFUSION (WH - ANES)
14.0000 mL/h | INTRAMUSCULAR | Status: DC | PRN
  Administered 2017-10-23: 12 mL/h via EPIDURAL
  Filled 2017-10-23: qty 100

## 2017-10-23 MED ORDER — IBUPROFEN 600 MG PO TABS
600.0000 mg | ORAL_TABLET | Freq: Four times a day (QID) | ORAL | Status: DC
  Administered 2017-10-23 – 2017-10-25 (×7): 600 mg via ORAL
  Filled 2017-10-23 (×7): qty 1

## 2017-10-23 MED ORDER — COCONUT OIL OIL: 1.0000 "application " | TOPICAL_OIL | Status: DC | PRN

## 2017-10-23 MED ORDER — ONDANSETRON HCL 4 MG/2ML IJ SOLN
INTRAMUSCULAR | Status: AC
  Filled 2017-10-23: qty 2

## 2017-10-23 MED ORDER — DIPHENHYDRAMINE HCL 25 MG PO CAPS: 25.0000 mg | ORAL_CAPSULE | Freq: Four times a day (QID) | ORAL | Status: DC | PRN

## 2017-10-23 MED ORDER — FENTANYL CITRATE (PF) 100 MCG/2ML IJ SOLN
INTRAMUSCULAR | Status: DC | PRN
  Administered 2017-10-23: 50 ug via INTRAVENOUS
  Administered 2017-10-23 (×2): 25 ug via INTRAVENOUS

## 2017-10-23 MED ORDER — METOCLOPRAMIDE HCL 10 MG PO TABS
10.0000 mg | ORAL_TABLET | Freq: Once | ORAL | Status: AC
  Administered 2017-10-23: 10 mg via ORAL
  Filled 2017-10-23: qty 1

## 2017-10-23 MED ORDER — MISOPROSTOL 50MCG HALF TABLET
50.0000 ug | ORAL_TABLET | ORAL | Status: DC | PRN
Start: 2017-10-23 — End: 2017-10-23

## 2017-10-23 MED ORDER — HYDROXYZINE HCL 50 MG PO TABS
50.0000 mg | ORAL_TABLET | Freq: Four times a day (QID) | ORAL | Status: DC | PRN
  Filled 2017-10-23: qty 1

## 2017-10-23 MED ORDER — ONDANSETRON HCL 4 MG PO TABS: 4.0000 mg | ORAL_TABLET | ORAL | Status: DC | PRN

## 2017-10-23 MED ORDER — MIDAZOLAM HCL 5 MG/5ML IJ SOLN
INTRAMUSCULAR | Status: DC | PRN
  Administered 2017-10-23: .5 mg via INTRAVENOUS
  Administered 2017-10-23: 1 mg via INTRAVENOUS
  Administered 2017-10-23: .5 mg via INTRAVENOUS

## 2017-10-23 MED ORDER — LACTATED RINGERS IV SOLN
INTRAVENOUS | Status: DC | PRN
  Administered 2017-10-23: 17:00:00 via INTRAVENOUS

## 2017-10-23 MED ORDER — TETANUS-DIPHTH-ACELL PERTUSSIS 5-2.5-18.5 LF-MCG/0.5 IM SUSP: 0.5000 mL | Freq: Once | INTRAMUSCULAR | Status: DC

## 2017-10-23 MED ORDER — OXYTOCIN 40 UNITS IN LACTATED RINGERS INFUSION - SIMPLE MED
1.0000 m[IU]/min | INTRAVENOUS | Status: DC
  Administered 2017-10-23: 2 m[IU]/min via INTRAVENOUS

## 2017-10-23 MED ORDER — FAMOTIDINE 20 MG PO TABS
40.0000 mg | ORAL_TABLET | Freq: Once | ORAL | Status: AC
Start: 2017-10-23 — End: 2017-10-23
  Administered 2017-10-23: 40 mg via ORAL
  Filled 2017-10-23: qty 2

## 2017-10-23 MED ORDER — SODIUM BICARBONATE 8.4 % IV SOLN
INTRAVENOUS | Status: DC | PRN
  Administered 2017-10-23: 3 mL via EPIDURAL
  Administered 2017-10-23: 2 mL via EPIDURAL
  Administered 2017-10-23: 3 mL via EPIDURAL
  Administered 2017-10-23: 10 mL via EPIDURAL
  Administered 2017-10-23: 2 mL via EPIDURAL

## 2017-10-23 SURGICAL SUPPLY — 22 items
BLADE SURG 11 STRL SS (BLADE) ×3 IMPLANT
CLIP FILSHIE TUBAL LIGA STRL (Clip) ×3 IMPLANT
CLOTH BEACON ORANGE TIMEOUT ST (SAFETY) ×3 IMPLANT
DRSG OPSITE POSTOP 3X4 (GAUZE/BANDAGES/DRESSINGS) ×3 IMPLANT
DURAPREP 26ML APPLICATOR (WOUND CARE) ×3 IMPLANT
GLOVE BIO SURGEON STRL SZ 6.5 (GLOVE) ×2 IMPLANT
GLOVE BIO SURGEONS STRL SZ 6.5 (GLOVE) ×1
GLOVE BIOGEL PI IND STRL 7.0 (GLOVE) ×2 IMPLANT
GLOVE BIOGEL PI INDICATOR 7.0 (GLOVE) ×4
GOWN STRL REUS W/TWL LRG LVL3 (GOWN DISPOSABLE) ×6 IMPLANT
NEEDLE HYPO 22GX1.5 SAFETY (NEEDLE) ×3 IMPLANT
NS IRRIG 1000ML POUR BTL (IV SOLUTION) ×3 IMPLANT
PACK ABDOMINAL MINOR (CUSTOM PROCEDURE TRAY) ×3 IMPLANT
PROTECTOR NERVE ULNAR (MISCELLANEOUS) ×3 IMPLANT
SPONGE LAP 4X18 X RAY DECT (DISPOSABLE) IMPLANT
SUT VIC AB 0 CT1 27 (SUTURE) ×3
SUT VIC AB 0 CT1 27XBRD ANBCTR (SUTURE) ×1 IMPLANT
SUT VICRYL 4-0 PS2 18IN ABS (SUTURE) ×3 IMPLANT
SYR CONTROL 10ML LL (SYRINGE) ×3 IMPLANT
TOWEL OR 17X24 6PK STRL BLUE (TOWEL DISPOSABLE) ×6 IMPLANT
TRAY FOLEY CATH SILVER 16FR (SET/KITS/TRAYS/PACK) ×3 IMPLANT
WATER STERILE IRR 1000ML POUR (IV SOLUTION) ×3 IMPLANT

## 2017-10-23 NOTE — Progress Notes (Signed)
Subjective: Doing well. Feeling some pressure from contractions, but pain well controlled with epidural   Objective: BP 115/68   Pulse (!) 55   Temp 97.8 F (36.6 C) (Oral)   Resp 18   LMP 01/22/2017 (Exact Date)   SpO2 100%  No intake/output data recorded. No intake/output data recorded.  FHT:  FHR: 140 bpm, variability: moderate,  accelerations:  Present,  decelerations:  Absent UC:   regular, every 3-4 minutes SVE:   Dilation: 5.5 Effacement (%): 70 Station: -2, -1 Exam by:: Foye Clock RN  Labs: Lab Results  Component Value Date   WBC 11.1 (H) 10/23/2017   HGB 11.7 (L) 10/23/2017   HCT 35.6 (L) 10/23/2017   MCV 90.8 10/23/2017   PLT 196 10/23/2017    Assessment / Plan: Induction of labor due to A1 GDM,  progressing well on pitocin. Depending on cervical changes, can likely AROM when appropriate.  Labor: Progressing on Pitocin, will continue to increase then AROM Preeclampsia:  N/A Fetal Wellbeing:  Category I Pain Control:  Epidural I/D:  n/a Anticipated MOD:  NSVD  Myrene Buddy 10/23/2017, 11:25 AM

## 2017-10-23 NOTE — Anesthesia Procedure Notes (Signed)
Epidural Patient location during procedure: OB Start time: 10/23/2017 8:56 AM End time: 10/23/2017 9:01 AM  Staffing Anesthesiologist: Cecile Hearing, MD Performed: anesthesiologist   Preanesthetic Checklist Completed: patient identified, pre-op evaluation, timeout performed, IV checked, risks and benefits discussed and monitors and equipment checked  Epidural Patient position: sitting Prep: DuraPrep Patient monitoring: blood pressure and continuous pulse ox Approach: midline Location: L3-L4 Injection technique: LOR air  Needle:  Needle type: Tuohy  Needle gauge: 17 G Needle length: 9 cm Needle insertion depth: 5 cm Catheter size: 19 Gauge Catheter at skin depth: 10 cm Test dose: negative and Other (1% Lidocaine)  Additional Notes Patient identified.  Risk benefits discussed including failed block, incomplete pain control, headache, nerve damage, paralysis, blood pressure changes, nausea, vomiting, reactions to medication both toxic or allergic, and postpartum back pain.  Patient expressed understanding and wished to proceed.  All questions were answered.  Sterile technique used throughout procedure and epidural site dressed with sterile barrier dressing. No paresthesia or other complications noted. The patient did not experience any signs of intravascular injection such as tinnitus or metallic taste in mouth nor signs of intrathecal spread such as rapid motor block. Please see nursing notes for vital signs. Reason for block:procedure for pain

## 2017-10-23 NOTE — Anesthesia Preprocedure Evaluation (Signed)
Anesthesia Evaluation  Patient identified by MRN, date of birth, ID band Patient awake    Reviewed: Allergy & Precautions, NPO status , Patient's Chart, lab work & pertinent test results  History of Anesthesia Complications (+) PONV, Family history of anesthesia reaction and history of anesthetic complications  Airway Mallampati: II  TM Distance: >3 FB Neck ROM: Full    Dental  (+) Dental Advisory Given, Poor Dentition, Chipped   Pulmonary Current Smoker,    Pulmonary exam normal breath sounds clear to auscultation       Cardiovascular negative cardio ROS Normal cardiovascular exam Rhythm:Regular Rate:Normal     Neuro/Psych PSYCHIATRIC DISORDERS Depression negative neurological ROS     GI/Hepatic Neg liver ROS, GERD  ,  Endo/Other  diabetes, GestationalObesity   Renal/GU negative Renal ROS     Musculoskeletal negative musculoskeletal ROS (+)   Abdominal   Peds  Hematology  (+) Blood dyscrasia, anemia , Plt 196k   Anesthesia Other Findings Day of surgery medications reviewed with the patient.  Reproductive/Obstetrics (+) Pregnancy                             Anesthesia Physical Anesthesia Plan  ASA: III  Anesthesia Plan: Epidural   Post-op Pain Management:    Induction:   PONV Risk Score and Plan: 2 and Treatment may vary due to age or medical condition  Airway Management Planned:   Additional Equipment:   Intra-op Plan:   Post-operative Plan:   Informed Consent: I have reviewed the patients History and Physical, chart, labs and discussed the procedure including the risks, benefits and alternatives for the proposed anesthesia with the patient or authorized representative who has indicated his/her understanding and acceptance.   Dental advisory given  Plan Discussed with:   Anesthesia Plan Comments: (Patient identified. Risks/Benefits/Options discussed with patient  including but not limited to bleeding, infection, nerve damage, paralysis, failed block, incomplete pain control, headache, blood pressure changes, nausea, vomiting, reactions to medication both or allergic, itching and postpartum back pain. Confirmed with bedside nurse the patient's most recent platelet count. Confirmed with patient that they are not currently taking any anticoagulation, have any bleeding history or any family history of bleeding disorders. Patient expressed understanding and wished to proceed. All questions were answered. )        Anesthesia Quick Evaluation

## 2017-10-23 NOTE — Anesthesia Pain Management Evaluation Note (Signed)
  CRNA Pain Management Visit Note  Patient: Maureen Perez, 32 y.o., female  "Hello I am a member of the anesthesia team at [facility]. We have an anesthesia team available at all times to provide care throughout the hospital, including epidural management and anesthesia for C-section. I don't know your plan for the delivery whether it a natural birth, water birth, IV sedation, nitrous supplementation, doula or epidural, but we want to meet your pain goals."   1.Was your pain managed to your expectations on prior hospitalizations?   Yes   2.What is your expectation for pain management during this hospitalization?     Epidural  3.How can we help you reach that goal? Epidural @ pain goal  Record the patient's initial score and the patient's pain goal.   Pain: 5  Pain Goal: 8 [facility] wants you to be able to say your pain was always managed very well.  Olusegun Gerstenberger 10/23/2017

## 2017-10-23 NOTE — Transfer of Care (Signed)
Immediate Anesthesia Transfer of Care Note  Patient: LENI PANKONIN  Procedure(s) Performed: POST PARTUM TUBAL LIGATION (Bilateral )  Patient Location: PACU  Anesthesia Type:Epidural  Level of Consciousness: awake, oriented and sedated  Airway & Oxygen Therapy: Patient Spontanous Breathing  Post-op Assessment: Report given to RN and Post -op Vital signs reviewed and stable  Post vital signs: Reviewed and stable  Last Vitals:  Vitals Value Taken Time  BP    Temp    Pulse    Resp    SpO2      Last Pain:  Vitals:   10/23/17 1637  TempSrc: Oral  PainSc: 0-No pain         Complications: No apparent anesthesia complications

## 2017-10-23 NOTE — Progress Notes (Signed)
32 y.o. [id number] with undesired fertility,status post vaginal delivery, desires permanent sterilization. Risks and benefits of procedure discussed with patient including permanence of method, use of Filshie clips,bleeding, infection, injury to surrounding organs and need for additional procedures. Risk failure of 0.5-1% with increased risk of ectopic gestation if pregnancy occurs was also discussed with patient. Risk of menstrual problems and pain discussed. Patient verbalized understanding and all questions were answered.  Currie Paris Debroah Loop MD 10/23/2017 4:53 PM

## 2017-10-23 NOTE — Anesthesia Preprocedure Evaluation (Signed)
Anesthesia Evaluation  Patient identified by MRN, date of birth, ID band Patient awake    Reviewed: Allergy & Precautions, NPO status , Patient's Chart, lab work & pertinent test results  History of Anesthesia Complications (+) PONV, Family history of anesthesia reaction and history of anesthetic complications  Airway Mallampati: II  TM Distance: >3 FB Neck ROM: Full    Dental  (+) Dental Advisory Given, Poor Dentition, Chipped   Pulmonary Current Smoker,    Pulmonary exam normal breath sounds clear to auscultation       Cardiovascular negative cardio ROS Normal cardiovascular exam Rhythm:Regular Rate:Normal     Neuro/Psych PSYCHIATRIC DISORDERS Depression negative neurological ROS     GI/Hepatic Neg liver ROS, GERD  ,  Endo/Other  diabetes, GestationalObesity   Renal/GU negative Renal ROS     Musculoskeletal negative musculoskeletal ROS (+)   Abdominal   Peds  Hematology  (+) Blood dyscrasia, anemia ,   Anesthesia Other Findings Day of surgery medications reviewed with the patient.  Reproductive/Obstetrics S/p SVD                             Anesthesia Physical Anesthesia Plan  ASA: II  Anesthesia Plan: Epidural   Post-op Pain Management:    Induction:   PONV Risk Score and Plan: 3 and Treatment may vary due to age or medical condition  Airway Management Planned: Simple Face Mask  Additional Equipment:   Intra-op Plan:   Post-operative Plan:   Informed Consent: I have reviewed the patients History and Physical, chart, labs and discussed the procedure including the risks, benefits and alternatives for the proposed anesthesia with the patient or authorized representative who has indicated his/her understanding and acceptance.   Dental advisory given  Plan Discussed with:   Anesthesia Plan Comments: (Epidural in situ from labor.  Will dose epidural for surgical  anesthesia.)        Anesthesia Quick Evaluation

## 2017-10-23 NOTE — Progress Notes (Signed)
Labor Progress Note  Maureen Perez is a 32 y.o. [id number] at [redacted]w[redacted]d  admitted for induction of labor due to Gestational diabetes.  S: Comfortable. Feeling contractions a little more. No complaints.   O:  BP 120/71   Pulse 61   Temp (!) 97.5 F (36.4 C) (Oral)   Resp 18   LMP 01/22/2017 (Exact Date)   No intake/output data recorded.  FHT:  FHR: 135 bpm, variability: moderate,  accelerations:  Present,  decelerations:  Absent UC:   regular, every 3-6 minutes SVE:   Dilation: 4 Effacement (%): 50 Station: -3 Exam by:: Doroteo Glassman ?ROM  Pitocin @ 8 mu/min  Labs: Lab Results  Component Value Date   WBC 11.1 (H) 10/23/2017   HGB 11.7 (L) 10/23/2017   HCT 35.6 (L) 10/23/2017   MCV 90.8 10/23/2017   PLT 196 10/23/2017    Assessment / Plan: 32 y.o. [id number] [redacted]w[redacted]d. Not in labor.  Induction of labor due to gestational diabetes,  progressing well on pitocin  Labor: Progressing on Pitocin, will continue to increase then AROM  GDM: CBGs have been well controlled Fetal Wellbeing:  Category I Pain Control:  Labor support without medications Anticipated MOD:  NSVD  Expectant management   Caryl Ada, DO OB Fellow Center for [facility], [facility]

## 2017-10-23 NOTE — Op Note (Signed)
Maureen Perez 10/23/2017  PREOPERATIVE DIAGNOSIS:  Multiparity, undesired fertility  POSTOPERATIVE DIAGNOSIS:  Multiparity, undesired fertility  PROCEDURE:  Postpartum Bilateral Tubal Sterilization using Filshie Clips   ANESTHESIA:  Epidural  COMPLICATIONS:  None immediate.  ESTIMATED BLOOD LOSS:  Less than 20 ml.  FLUIDS: 1000 ml LR.  URINE OUTPUT:  100 ml of clear urine.  INDICATIONS: 32 y.o. [id number]  with undesired fertility,status post vaginal delivery, desires permanent sterilization. Risks and benefits of procedure discussed with patient including permanence of method,use of clips on the tubes, bleeding, infection, injury to surrounding organs and need for additional procedures. Risk failure of 0.5-1% with increased risk of ectopic gestation if pregnancy occurs was also discussed with patient.   FINDINGS:  Normal uterus, tubes, and ovaries.  TECHNIQUE:  The patient was taken to the operating room where her epidural anesthesia was dosed up to surgical level and found to be adequate.  She was then placed in the dorsal supine position and prepped and draped in sterile fashion.  After an adequate timeout was performed, attention was turned to the patient's abdomen where a small transverse skin incision was made under the umbilical fold. The incision was taken down to the layer of fascia using the scalpel, and fascia was incised, and extended bilaterally using Mayo scissors. The peritoneum was entered in a sharp fashion. Omentum ws packed with 2x18 in tapes. Attention was then turned to the patient's uterus, and left fallopian tube was identified and followed out to the fimbriated end.  A Filshie clip was placed on the left fallopian tube about 2 cm from the cornual attachment, with care given to incorporate the underlying mesosalpinx.  A similar process was carried out on the right side allowing for bilateral tubal sterilization.  Good hemostasis was noted overall.  Local analgesia was  drizzled on both operative sites.The instruments were then removed from the patient's abdomen and the fascial incision was repaired with 0 Vicryl, and the skin was closed with a 4-0 Vicryl subcuticular stitch. The patient tolerated the procedure well.  Sponge, lap, and needle counts were correct times two.  The patient was then taken to the recovery room awake, extubated and in stable condition.  Scheryl Darter MD 10/23/2017 5:57 PM

## 2017-10-23 NOTE — Anesthesia Postprocedure Evaluation (Signed)
Anesthesia Post Note  Patient: Maureen Perez  Procedure(s) Performed: POST PARTUM TUBAL LIGATION (Bilateral )     Patient location during evaluation: PACU Anesthesia Type: Epidural Level of consciousness: awake and alert Pain management: pain level controlled Vital Signs Assessment: post-procedure vital signs reviewed and stable Respiratory status: spontaneous breathing, nonlabored ventilation and respiratory function stable Cardiovascular status: stable Postop Assessment: no headache, no backache and epidural receding Anesthetic complications: no    Last Vitals:  Vitals:   10/23/17 1932 10/23/17 1945  BP: 106/68   Pulse: (!) 57 60  Resp: 17 (!) 24  Temp:    SpO2: 99% 100%    Last Pain:  Vitals:   10/23/17 1930  TempSrc:   PainSc: 5    Pain Goal:                 Shelton Silvas

## 2017-10-24 ENCOUNTER — Encounter (HOSPITAL_COMMUNITY): Payer: Self-pay | Admitting: Obstetrics & Gynecology

## 2017-10-24 ENCOUNTER — Encounter (HOSPITAL_COMMUNITY): Payer: Self-pay

## 2017-10-24 MED ORDER — PNEUMOCOCCAL VAC POLYVALENT 25 MCG/0.5ML IJ INJ
0.5000 mL | INJECTION | INTRAMUSCULAR | Status: AC
  Administered 2017-10-25: 0.5 mL via INTRAMUSCULAR
  Filled 2017-10-24 (×2): qty 0.5

## 2017-10-24 MED ORDER — MEASLES, MUMPS & RUBELLA VAC ~~LOC~~ INJ
0.5000 mL | INJECTION | Freq: Once | SUBCUTANEOUS | Status: DC
  Filled 2017-10-24: qty 0.5

## 2017-10-24 NOTE — Progress Notes (Signed)
Post Partum Day 1, Post-operative day #1 from a BTL Subjective: no complaints, up ad lib, voiding, tolerating PO and + flatus  Objective: Blood pressure (!) 104/58, pulse (!) 58, temperature 97.7 F (36.5 C), resp. rate 18, last menstrual period 01/22/2017, SpO2 98 %, unknown if currently breastfeeding.  Physical Exam:  General: alert and cooperative Lochia: appropriate Uterine Fundus: firm Incision: minimal staining of dressing, no erythema or concerning physical exam findings DVT Evaluation: No evidence of DVT seen on physical exam. Negative Homan's sign.  Recent Labs    10/23/17 0118 10/23/17 1843  HGB 11.7* 11.5*  HCT 35.6* 35.6*    Assessment/Plan: Plan for discharge tomorrow, Breastfeeding, Lactation consult and Contraception already had BTL on 5/9   LOS: 1 day   Myrene Buddy 10/24/2017, 7:56 AM

## 2017-10-24 NOTE — Progress Notes (Signed)
MOB was referred for history of depression/anxiety. * Referral screened out by Clinical Social Worker because none of the following criteria appear to apply: ~ History of anxiety/depression during this pregnancy, or of post-partum depression. ~ Diagnosis of anxiety and/or depression within last 3 years OR * MOB's symptoms currently being treated with medication and/or therapy. Please contact the Clinical Social Worker if needs arise, by MOB request, or if MOB scores greater than 9/yes to question 10 on Edinburgh Postpartum Depression Screen.  Maureen Perez, MSW, LCSW Clinical Social Work [phone] 

## 2017-10-24 NOTE — Anesthesia Postprocedure Evaluation (Signed)
Anesthesia Post Note  Patient: Maureen Perez  Procedure(s) Performed: AN AD HOC LABOR EPIDURAL     Patient location during evaluation: Mother Baby Anesthesia Type: Epidural Level of consciousness: awake and alert Pain management: pain level controlled Vital Signs Assessment: post-procedure vital signs reviewed and stable Respiratory status: spontaneous breathing Cardiovascular status: blood pressure returned to baseline Postop Assessment: no headache, no backache, epidural receding, no apparent nausea or vomiting, patient able to bend at knees, able to ambulate and adequate PO intake Anesthetic complications: no    Last Vitals:  Vitals:   10/24/17 0826 10/24/17 1257  BP: (!) 108/49 113/74  Pulse: (!) 55 (!) 56  Resp: 20 18  Temp: 36.6 C (!) 36.4 C  SpO2: 99% 100%    Last Pain:  Vitals:   10/24/17 1258  TempSrc:   PainSc: 0-No pain   Pain Goal:                 Ferris Tally

## 2017-10-24 NOTE — Progress Notes (Signed)
Patient is non-immune to [place]. Information was given to patient on the MMR vaccine.

## 2017-10-24 NOTE — Progress Notes (Signed)
Attending Circumcision Counseling Progress Note  Patient desires circumcision for her female infant.  Circumcision procedure details discussed, risks and benefits of procedure were also discussed.  These include but are not limited to: Benefits of circumcision in men include reduction in the rates of urinary tract infection (UTI), penile cancer, some sexually transmitted infections, penile inflammatory and retractile disorders, as well as easier hygiene.  Risks include bleeding , infection, injury of glans which may lead to penile deformity or urinary tract issues, unsatisfactory cosmetic appearance and other potential complications related to the procedure.  It was emphasized that this is an elective procedure.  Patient wants to proceed with circumcision; written informed consent obtained.  Will do circumcision soon, routine circumcision and post circumcision care ordered for the infant.  Jaynie Collins, [name] 10/24/2017 1:12 PM

## 2017-10-25 ENCOUNTER — Other Ambulatory Visit: Payer: Self-pay

## 2017-10-25 DIAGNOSIS — Z302 Encounter for sterilization: Secondary | ICD-10-CM

## 2017-10-25 MED ORDER — OXYCODONE HCL 5 MG PO TABS: 5.0000 mg | ORAL_TABLET | ORAL | 0 refills | Status: DC | PRN

## 2017-10-25 MED ORDER — IBUPROFEN 600 MG PO TABS: 600.0000 mg | ORAL_TABLET | Freq: Four times a day (QID) | ORAL | 0 refills | Status: DC

## 2017-10-25 NOTE — Progress Notes (Signed)
Offered patient MMR as rubella titer=0.90.  Patient refused vaccine.  Patient has gotten BTL and does not plan to have any more children and therefore does not want vaccine.

## 2017-10-25 NOTE — Discharge Summary (Signed)
OB Discharge Summary     Patient Name: Maureen Perez DOB: 11-25-1985 MRN: [id number]  Date of admission: 10/23/2017 Delivering MD: Donette Larry   Date of discharge: 10/25/2017  Admitting diagnosis: INDUCTION Intrauterine pregnancy: [redacted]w[redacted]d     Secondary diagnosis:  Active Problems:   Labor and delivery indication for care or intervention  Additional problems: tobacco use in pregnancy, Rubella non-immune, A1GDM      Discharge diagnosis: Term Pregnancy Delivered, s/p BTL                                                                                             Post partum procedures:postpartum tubal ligation  Augmentation: Pitocin  Complications: None  Hospital course:  Induction of Labor With Vaginal Delivery   32 y.o. yo [id number] at [redacted]w[redacted]d was admitted to the hospital 10/23/2017 for induction of labor.  Indication for induction: A1 DM.  Patient had an uncomplicated labor course as follows: Membrane Rupture Time/Date: 10:15 AM ,10/23/2017   Intrapartum Procedures: Episiotomy: None [1]                                         Lacerations:  None [1]  Patient had delivery of a Viable infant.  Information for the patient's newborn:  Jerzie, Bieri [[id number]]  Delivery Method: Vaginal, Spontaneous(Filed from Delivery Summary)   10/23/2017  Details of delivery can be found in separate delivery note.  Patient had a routine postpartum course. Patient is discharged home 10/25/17.  Physical exam  Vitals:   10/24/17 0826 10/24/17 1257 10/24/17 1713 10/25/17 0603  BP: (!) 108/49 113/74 131/73 111/66  Pulse: (!) 55 (!) 56 68 (!) 50  Resp: Temp: 97.8 F (36.6 C) (!) 97.5 F (36.4 C) 98.1 F (36.7 C) 98 F (36.7 C)  TempSrc: Oral Oral Oral Oral  SpO2: 99% 100% 100%    General: alert, cooperative and no distress Lochia: appropriate Uterine Fundus: firm Incision: Healing well with no significant drainage, No significant erythema, Dressing is clean, dry, and  intact DVT Evaluation: No evidence of DVT seen on physical exam. Negative Homan's sign. No cords or calf tenderness. No significant calf/ankle edema. Labs: Lab Results  Component Value Date   WBC 11.7 (H) 10/23/2017   HGB 11.5 (L) 10/23/2017   HCT 35.6 (L) 10/23/2017   MCV 92.7 10/23/2017   PLT 164 10/23/2017   CMP Latest Ref Rng & Units 10/23/2017  Glucose 65 - 99 mg/dL 130(Q)  BUN 6 - 20 mg/dL 8  Creatinine 6.57 - 8.46 mg/dL 9.62  Sodium 952 - 841 mmol/L 136  Potassium 3.5 - 5.1 mmol/L 3.7  Chloride 101 - 111 mmol/L 104  CO2 22 - 32 mmol/L 20(L)  Calcium 8.9 - 10.3 mg/dL 3.2(G)  Total Protein 6.5 - 8.1 g/dL 6.1(L)  Total Bilirubin 0.3 - 1.2 mg/dL 0.5  Alkaline Phos 38 - 126 U/L 183(H)  AST 15 - 41 U/L 17  ALT 14 - 54 U/L 8(L)    Discharge instruction:  per After Visit Summary and "Baby and Me Booklet".  After visit meds:  Allergies as of 10/25/2017      Reactions   Cephalexin Itching, Nausea And Vomiting   Percocet [oxycodone-acetaminophen] Nausea And Vomiting      Medication List    STOP taking these medications   acetaminophen 325 MG tablet Commonly known as:  TYLENOL   PRENATAL VITAMINS PO     TAKE these medications   ibuprofen 600 MG tablet Commonly known as:  ADVIL,MOTRIN Take 1 tablet (600 mg total) by mouth every 6 (six) hours.   oxyCODONE 5 MG immediate release tablet Commonly known as:  ROXICODONE Take 1 tablet (5 mg total) by mouth every 4 (four) hours as needed for severe pain.       Diet: routine diet  Activity: Advance as tolerated. Pelvic rest for 6 weeks.   Outpatient follow up:6 weeks Follow up Appt: Future Appointments  Date Time Provider Department Center  12/05/2017  8:15 AM Marny Lowenstein, PA-C [facility]  12/05/2017  8:50 AM [facility]   Follow up Visit:No follow-ups on file.  Postpartum contraception: Tubal Ligation  Newborn Data: Live born female  Birth Weight: 6 lb 5.2 oz (2870 g) APGAR: 9, 9  Newborn  Delivery   Birth date/time:  10/23/2017 11:51:00 Delivery type:  Vaginal, Spontaneous     Baby Feeding: Bottle Disposition:home with mother   10/25/2017 Donette Larry, CNM

## 2017-12-04 ENCOUNTER — Other Ambulatory Visit: Payer: Self-pay | Admitting: *Deleted

## 2017-12-04 DIAGNOSIS — O24439 Gestational diabetes mellitus in the puerperium, unspecified control: Secondary | ICD-10-CM

## 2017-12-05 ENCOUNTER — Ambulatory Visit: Payer: Medicaid Other | Admitting: Medical

## 2017-12-05 ENCOUNTER — Other Ambulatory Visit: Payer: Medicaid Other

## 2017-12-14 IMAGING — US US OB TRANSVAGINAL
1 series · 15 of 28 positions shown · non-contrast
Comparison: None.

CLINICAL DATA: Abdominal cramping positive urine pregnancy test

EXAM:
OBSTETRIC <14 WK US AND TRANSVAGINAL OB US
TECHNIQUE: Both transabdominal and transvaginal ultrasound examinations were
performed for complete evaluation of the gestation as well as the
maternal uterus, adnexal regions, and pelvic cul-de-sac.
Transvaginal technique was performed to assess early pregnancy.

[Series 1: us ob transvaginal · 15 of 64 slices shown]
[im 1/64]
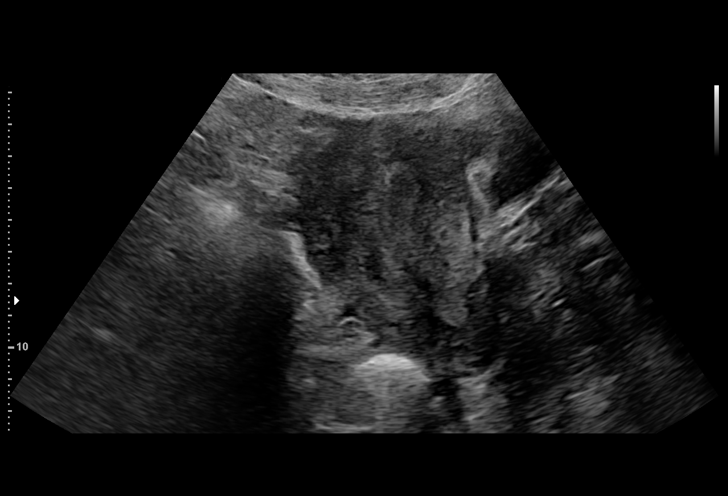
[im 5/64]
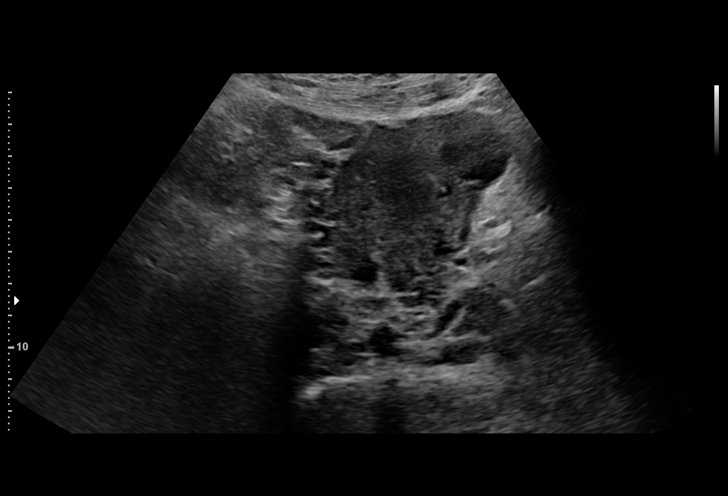
[im 10/64]
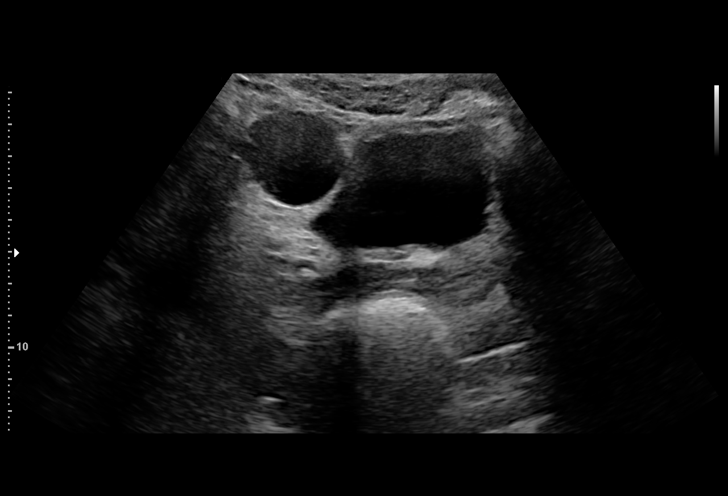
[im 15/64]
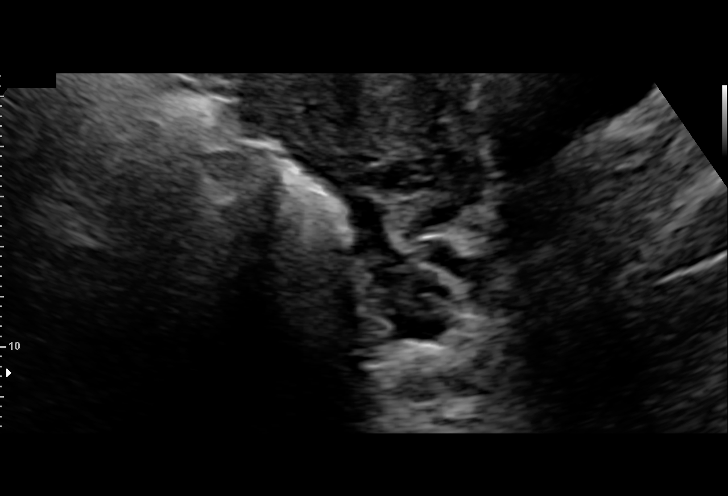
[im 19/64]
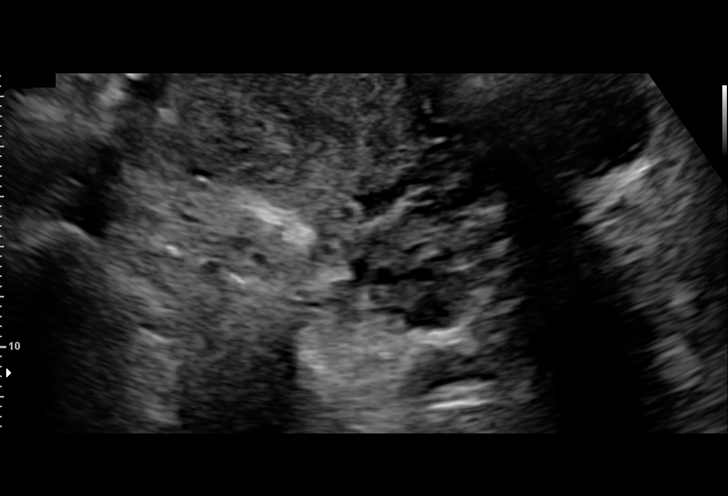
[im 24/64]
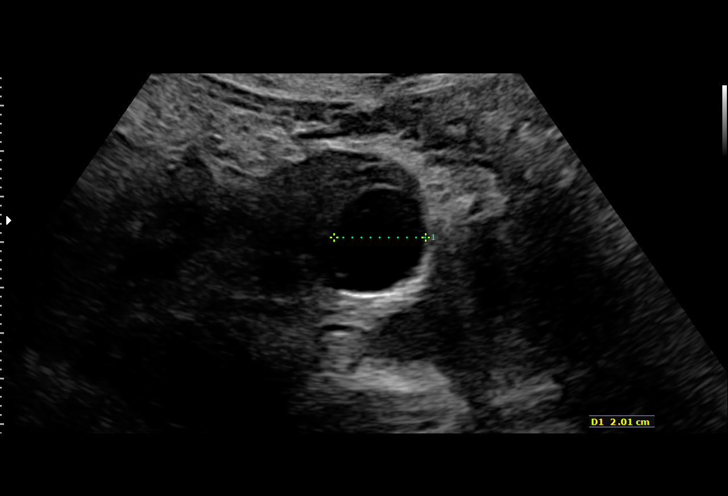
[im 29/64]
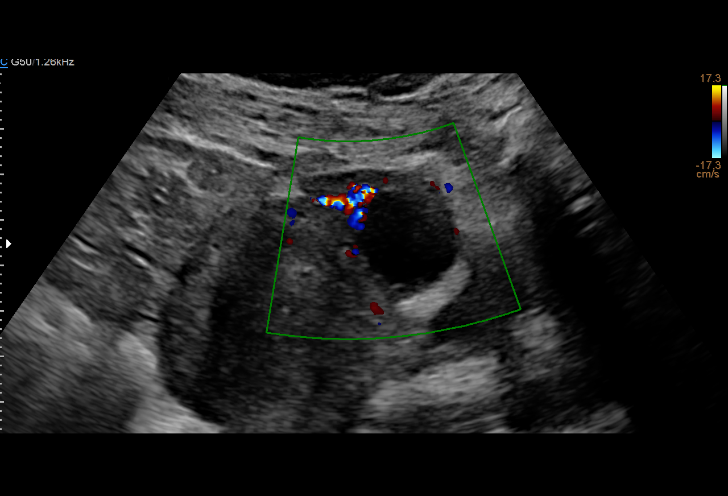
[im 33/64]
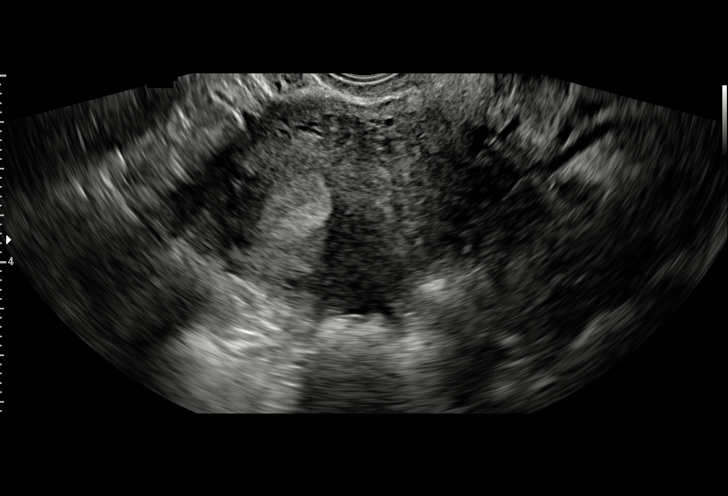
[im 36/64]
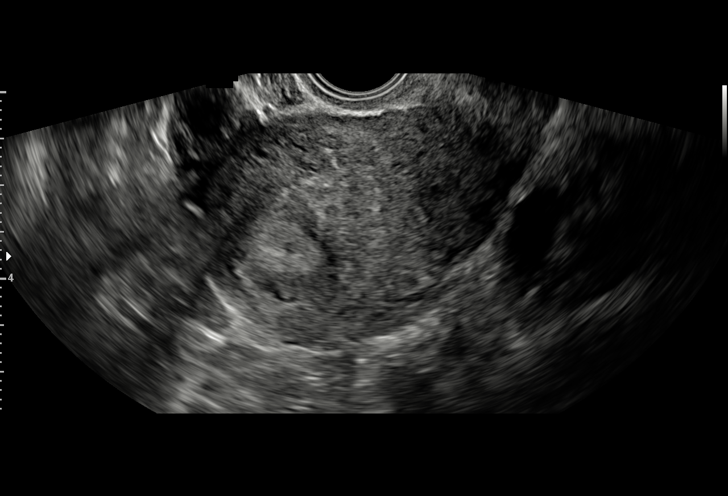
[im 40/64]
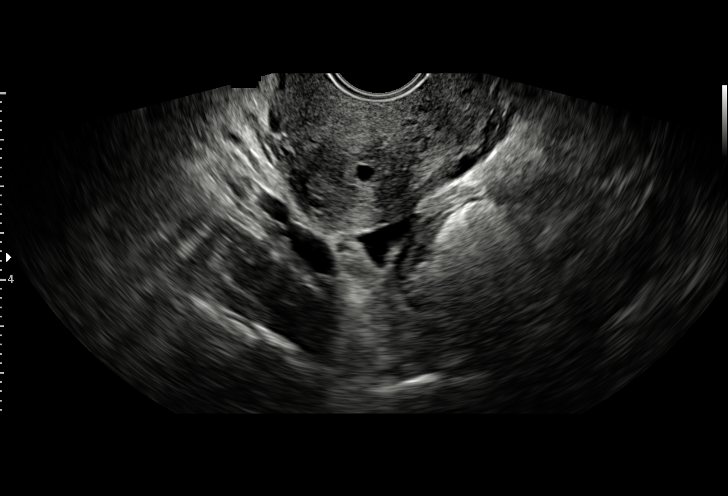
[im 45/64]
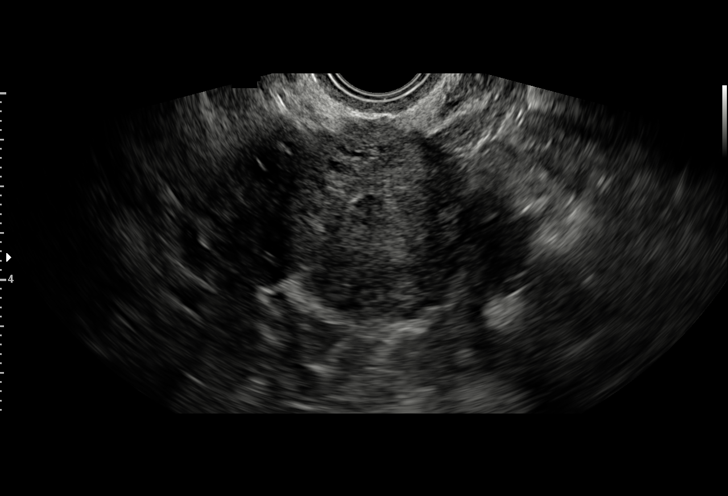
[im 50/64]
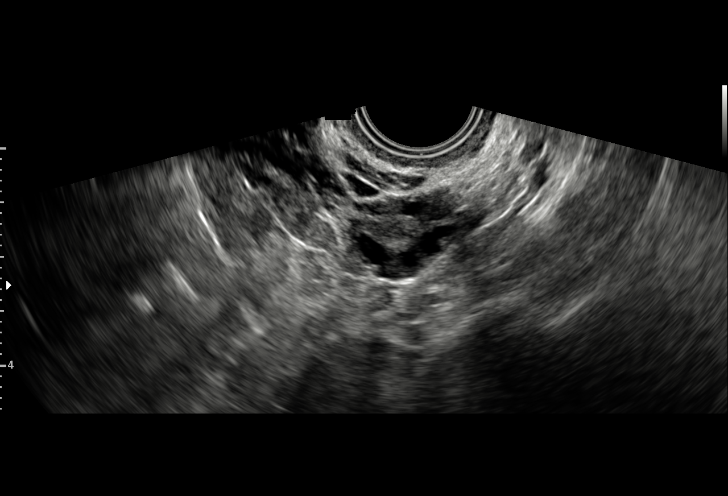
[im 54/64]
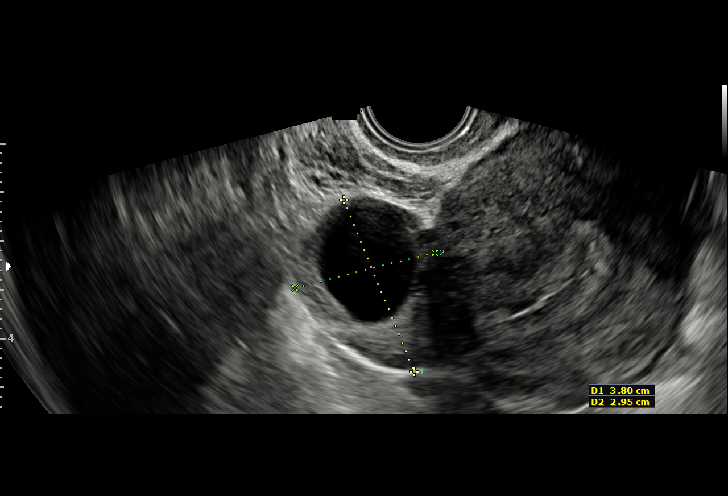
[im 59/64]
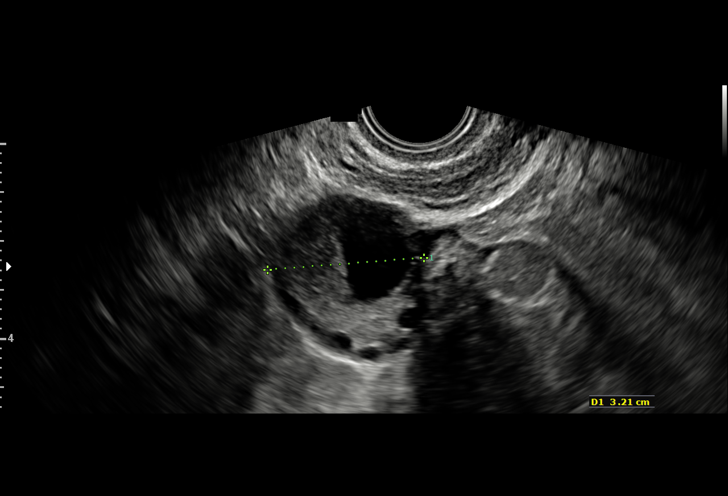
[im 64/64]
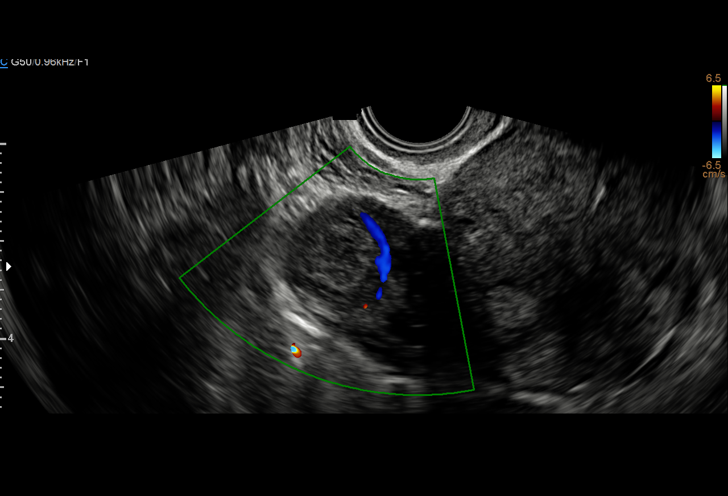

[15 of 28 positions shown; findings below may reference images not displayed]

FINDINGS: Intrauterine gestational sac: Not visualized

Yolk sac:  Not visualized

Embryo:  Not visualized

Maternal uterus/adnexae: Small nabothian cyst. Bilateral ovaries are
within normal limits. Left ovary measures 2.6 x 1.7 by 2 cm. The
right ovary measures 3.8 x 3 by 3.2 cm. Cyst in the right ovary
measuring 2.6 cm. No significant free fluid.
IMPRESSION: No intrauterine pregnancy is visualized. Findings consistent with
pregnancy of unknown location, differential of which consists of IUP
too early to visualize, recent failed pregnancy, and occult ectopic.
Recommend trending of HCG with repeat ultrasound as indicated.

## 2017-12-22 ENCOUNTER — Other Ambulatory Visit: Payer: Self-pay

## 2017-12-22 ENCOUNTER — Encounter (HOSPITAL_COMMUNITY): Payer: Self-pay | Admitting: Emergency Medicine

## 2017-12-22 ENCOUNTER — Emergency Department (HOSPITAL_COMMUNITY)
Admission: EM | Admit: 2017-12-22 | Discharge: 2017-12-22 | Disposition: A | Discharge status: Home / Self Care | Payer: Medicaid Other | Attending: Emergency Medicine | Admitting: Emergency Medicine

## 2017-12-22 DIAGNOSIS — R1011 Right upper quadrant pain: Secondary | ICD-10-CM | POA: Insufficient documentation

## 2017-12-22 DIAGNOSIS — F17210 Nicotine dependence, cigarettes, uncomplicated: Secondary | ICD-10-CM | POA: Diagnosis not present

## 2017-12-22 DIAGNOSIS — R109 Unspecified abdominal pain: Secondary | ICD-10-CM

## 2017-12-22 LAB — CBC
HCT: 42.4 % (ref 36.0–46.0)
Hemoglobin: 14.1 g/dL (ref 12.0–15.0)
MCH: 29.3 pg (ref 26.0–34.0)
MCHC: 33.3 g/dL (ref 30.0–36.0)
MCV: 88 fL (ref 78.0–100.0)
Platelets: 241 10*3/uL (ref 150–400)
RBC: 4.82 MIL/uL (ref 3.87–5.11)
RDW: 14.5 % (ref 11.5–15.5)
WBC: 11.2 10*3/uL — AB (ref 4.0–10.5)

## 2017-12-22 LAB — URINALYSIS, ROUTINE W REFLEX MICROSCOPIC
BILIRUBIN URINE: NEGATIVE
Glucose, UA: NEGATIVE mg/dL
HGB URINE DIPSTICK: NEGATIVE
Ketones, ur: NEGATIVE mg/dL
Nitrite: NEGATIVE
Protein, ur: NEGATIVE mg/dL
Specific Gravity, Urine: 1.024 (ref 1.005–1.030)
pH: 5 (ref 5.0–8.0)

## 2017-12-22 LAB — COMPREHENSIVE METABOLIC PANEL
ALT: 22 U/L (ref 0–44)
AST: 20 U/L (ref 15–41)
Albumin: 4.2 g/dL (ref 3.5–5.0)
Alkaline Phosphatase: 56 U/L (ref 38–126)
Anion gap: 6 (ref 5–15)
BUN: 12 mg/dL (ref 6–20)
CHLORIDE: 109 mmol/L (ref 98–111)
CO2: 26 mmol/L (ref 22–32)
Calcium: 9.4 mg/dL (ref 8.9–10.3)
Creatinine, Ser: 0.8 mg/dL (ref 0.44–1.00)
GFR calc Af Amer: >60 mL/min (ref >60)
Glucose, Bld: 96 mg/dL (ref 70–99)
POTASSIUM: 4.3 mmol/L (ref 3.5–5.1)
SODIUM: 141 mmol/L (ref 135–145)
Total Bilirubin: 0.6 mg/dL (ref 0.3–1.2)
Total Protein: 6.9 g/dL (ref 6.5–8.1)

## 2017-12-22 LAB — LIPASE, BLOOD: Lipase: 40 U/L (ref 11–51)

## 2017-12-22 LAB — I-STAT BETA HCG BLOOD, ED (MC, WL, AP ONLY)

## 2017-12-22 MED ORDER — IBUPROFEN 600 MG PO TABS: 600.0000 mg | ORAL_TABLET | Freq: Three times a day (TID) | ORAL | 0 refills | Status: DC | PRN

## 2017-12-22 NOTE — ED Triage Notes (Signed)
Pt complaint of right abdominal pain for a month post "tubal." Denies n/v/d.

## 2017-12-22 NOTE — ED Provider Notes (Signed)
[facility]-EMERGENCY DEPT Provider Note   CSN: [id number] Arrival date & time: 12/22/17  1150     History   Chief Complaint Chief Complaint  Patient presents with  . Abdominal Pain    HPI Maureen Perez is a 32 y.o. female.  The history is provided by the patient. No language interpreter was used.  Abdominal Pain       32 year old obese female with history of cholelithiasis, gestational diabetes, GERD, prior tubal ligation presenting for evaluation of abdominal pain.  Patient mentions since having her tubal ligation bilaterally performed in May of this year, she has had persistent discomfort to her right abdomen.  She describes it as a constant, nonradiating, sharp pain which worsened with movement or prolonged standing.  She rates the pain as 9 out of 10.  She is taking Tylenol at home with minimal improvement.  She did reach out to her OB/GYN office but was recommended to come to ER for further evaluation.  She did mention losing her child at 28 days and did not follow-up with OB/GYN for her regular checkup.  No report of fever, nausea, vomiting, diarrhea, dysuria, vaginal bleeding, vaginal discharge, hematuria.  She has prior cholecystectomy.  She denies any postprandial pain.  Past Medical History:  Diagnosis Date  . Abnormal Pap smear    repeat WNL  . Back pain   . Cholelithiasis   . Depression    no meds since pregancny  . Family history of adverse reaction to anesthesia    " MOTHER TAKES ALONG TIME TO WAKE "  . Gestational diabetes 08/20/2017   gestational - diet controlled  . Hearing loss    as a child - tubes in ear  . Infection    UTI  . Injury of tendon of left rotator cuff   . Obesity   . PONV (postoperative nausea and vomiting)   . Vaginal Pap smear, abnormal     Patient Active Problem List   Diagnosis Date Noted  . Encounter for tubal ligation 10/25/2017  . Labor and delivery indication for care or intervention 10/23/2017  .  Preterm uterine contractions in third trimester, antepartum 09/28/2017  . GDM, class A1 08/20/2017  . Supervision of high risk pregnancy, antepartum, third trimester 04/10/2017  . Rampant dental caries 04/10/2017  . GERD (gastroesophageal reflux disease) 06/15/2013  . Tobacco use disorder affecting pregnancy, antepartum 01/12/2013  . Rubella non-immune status, antepartum 12/24/2012  . DEPRESSION, MODERATE, RECURRENT 03/20/2010  . 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 INSOMNIA 01/06/2009  . TOBACCO ABUSE 11/22/2008  . ALLERGIC RHINITIS DUE TO ANIMAL HAIR AND DANDER 11/22/2008  . OBESITY 10/20/2008    Past Surgical History:  Procedure Laterality Date  . CHOLECYSTECTOMY N/A 12/05/2014   Procedure: LAPAROSCOPIC CHOLECYSTECTOMY WITH INTRAOPERATIVE CHOLANGIOGRAM;  Surgeon: Chevis PrettyPaul Toth III, MD;  Location: [facility] OR;  Service: General;  Laterality: N/A;  . TONSILLECTOMY     age 32  . TUBAL LIGATION Bilateral 10/23/2017   Procedure: POST PARTUM TUBAL LIGATION;  Surgeon: Adam PhenixArnold, James G, MD;  [facility];  Service: Gynecology;  Laterality: Bilateral;  . tubes in ears  age 89     OB History    Gravida  5   Para  3   Term  3   Preterm  0   AB  2   Living  3     SAB  2   TAB  0   Ectopic  0   Multiple  0  Live Births  3            Home Medications    Prior to Admission medications   Medication Sig Start Date End Date Taking? Authorizing Provider  ibuprofen (ADVIL,MOTRIN) 600 MG tablet Take 1 tablet (600 mg total) by mouth every 6 (six) hours. 10/25/17   Donette Larry, CNM  oxyCODONE (ROXICODONE) 5 MG immediate release tablet Take 1 tablet (5 mg total) by mouth every 4 (four) hours as needed for severe pain. 10/25/17 10/25/18  Donette Larry, CNM    Family History Family History  Problem Relation Age of Onset  . Depression Mother   . Diabetes Mother   . Hypertension Mother   . COPD Mother   . Anesthesia problems Mother        problems waking  and nausea and vomitting  . Depression Sister   . Diabetes Sister   . Kidney disease Sister   . Hypertension Sister   . Hypertension Father   . COPD Father   . Anesthesia problems Father        N&V    Social History Social History   Tobacco Use  . Smoking status: Current Every Day Smoker    Packs/day: 0.50    Years: 10.00    Pack years: 5.00    Types: Cigarettes  . Smokeless tobacco: Never Used  Substance Use Topics  . Alcohol use: No    Alcohol/week: 0.0 oz  . Drug use: No    Comment: not any more     Allergies   Cephalexin and Percocet [oxycodone-acetaminophen]   Review of Systems Review of Systems  Gastrointestinal: Positive for abdominal pain.  All other systems reviewed and are negative.    Physical Exam Updated Vital Signs BP 129/68 (BP Location: Left Arm)   Pulse 74   Temp 98.3 F (36.8 C) (Oral)   Resp 16   SpO2 100%   Physical Exam  Constitutional: She appears well-developed and well-nourished. No distress.  HENT:  Head: Atraumatic.  Very poor dentition.  Eyes: Conjunctivae are normal.  Neck: Neck supple.  Cardiovascular: Normal rate and regular rhythm.  Pulmonary/Chest: Effort normal and breath sounds normal.  Abdominal: Normal appearance and normal aorta. There is tenderness in the right upper quadrant. There is no rebound, no tenderness at McBurney's point and negative Murphy's sign. No hernia.  Neurological: She is alert.  Skin: No rash noted.  Psychiatric: She has a normal mood and affect.  Nursing note and vitals reviewed.    ED Treatments / Results  Labs (all labs ordered are listed, but only abnormal results are displayed) Labs Reviewed  CBC - Abnormal; Notable for the following components:      Result Value   WBC 11.2 (*)    All other components within normal limits  URINALYSIS, ROUTINE W REFLEX MICROSCOPIC - Abnormal; Notable for the following components:   APPearance HAZY (*)    Leukocytes, UA SMALL (*)    Bacteria, UA  RARE (*)    All other components within normal limits  LIPASE, BLOOD  COMPREHENSIVE METABOLIC PANEL  I-STAT BETA HCG BLOOD, ED ([facility], [facility], [facility] ONLY)    EKG None  Radiology No results found.  Procedures Procedures (including critical care time)  Medications Ordered in ED Medications - No data to display   Initial Impression / Assessment and Plan / ED Course  I have reviewed the triage vital signs and the nursing notes.  Pertinent labs & imaging results that were available during my  care of the patient were reviewed by me and considered in my medical decision making (see chart for details).     BP 129/68 (BP Location: Left Arm)   Pulse 74   Temp 98.3 F (36.8 C) (Oral)   Resp 16   SpO2 100%    Final Clinical Impressions(s) / ED Diagnoses   Final diagnoses:  Right sided abdominal pain    ED Discharge Orders        Ordered    ibuprofen (ADVIL,MOTRIN) 600 MG tablet  Every 8 hours PRN     12/22/17 1402     1:12 PM Patient mention persistent right-sided abdominal pain since bilateral tubal ligation performed nearly 3 months ago.  She is here at the recommendation of OB/GYN after she called to request a follow-up.  She does have some mild tenderness to her right upper quadrant on palpation however she report prior cholecystectomy.  No postprandial pain and no other concerning feature.  She is afebrile, vital signs stable.  I have low suspicion for acute emergent abdominal pathology causing her symptoms.  Will perform screening labs.  2:03 PM Labs are reassuring, normal UA, active, normal lipase, normal liver function, normal electrolytes, normal WBC.  Suspect pain is likely secondary to scar tissue or potential round ligament syndrome.  Encourage patient to follow-up with OB/GYN as needed.  Ibuprofen prescribed as needed for pain. Care discussed with Dr. Fredderick Phenix.    Fayrene Helper, PA-C 12/22/17 1404    Rolan Bucco, MD 12/22/17 1432

## 2017-12-22 NOTE — Discharge Instructions (Signed)
Your pain may be due to scar tissue.  Take ibuprofen as needed for pain.  Follow up with OBGYN for further care.  Your labs are normal today.

## 2017-12-23 ENCOUNTER — Encounter: Payer: Self-pay | Admitting: Advanced Practice Midwife

## 2017-12-23 ENCOUNTER — Ambulatory Visit (INDEPENDENT_AMBULATORY_CARE_PROVIDER_SITE_OTHER): Payer: Medicaid Other | Admitting: Advanced Practice Midwife

## 2017-12-23 DIAGNOSIS — R109 Unspecified abdominal pain: Secondary | ICD-10-CM

## 2017-12-23 DIAGNOSIS — R1031 Right lower quadrant pain: Secondary | ICD-10-CM

## 2017-12-23 DIAGNOSIS — Z3202 Encounter for pregnancy test, result negative: Secondary | ICD-10-CM | POA: Diagnosis not present

## 2017-12-23 DIAGNOSIS — F4321 Adjustment disorder with depressed mood: Secondary | ICD-10-CM | POA: Insufficient documentation

## 2017-12-23 DIAGNOSIS — G8918 Other acute postprocedural pain: Secondary | ICD-10-CM

## 2017-12-23 DIAGNOSIS — Z634 Disappearance and death of family member: Secondary | ICD-10-CM

## 2017-12-23 LAB — POCT PREGNANCY, URINE: PREG TEST UR: NEGATIVE

## 2017-12-23 NOTE — Patient Instructions (Signed)

## 2017-12-23 NOTE — Progress Notes (Signed)
  Subjective:     Maureen Perez is a 32 y.o. female who presents for a postpartum visit. Of note, her infant died at one month of life from possible SIDS with investigation still pending.  She is 8 weeks postpartum following a spontaneous vaginal delivery. I have fully reviewed the prenatal and intrapartum course. The delivery was at 39 gestational weeks. Outcome: spontaneous vaginal delivery. Anesthesia: epidural. Postpartum course has been normal except RLQ pain since BTL. Bleeding no bleeding. Bowel function is normal. Bladder function is normal. Patient is sexually active. Contraception method is tubal ligation. Postpartum depression screening: positive.  The following portions of the patient's history were reviewed and updated as appropriate: allergies, current medications, past family history, past medical history, past social history, past surgical history and problem list.  Review of Systems Pertinent items noted in HPI and remainder of comprehensive ROS otherwise negative.   Objective:    There were no vitals taken for this visit.   Nursing note reviewed,  Constitutional: well developed, well nourished, no distress HEENT: normocephalic CV: normal rate Pulm/chest wall: normal effort Abdomen: soft, bowel sounds x 4 quadrants, no palpable or visible hernia or other mass/abnormality Neuro: alert and oriented x 3 Skin: warm, dry Psych: affect normal  Assessment/Plan:   1. RLQ abdominal pain --Pain is persistent, no acute findings.  Pain started following surgery but worsened when pt returned to work and lifted heavy items. --No evidence of physical exam but pain c/w abdominal hernia. - CT ABDOMEN PELVIS W CONTRAST; Future - US PELVIC COMPLETE WITH TRANSVAGINAL; Future --Pt missing work due to pain.  Since imaging ordered for evaluation, note for pt to miss work until imaging results (x2 weeks).  If both are normal, discussed possibility that pt will need to f/u with primary  care.  2. Postoperative abdominal pain --Since pain is post BTL, will evaluate for pelvic source of pain with US in addition to CT scan. --Pt to f/u in office in 1 month to review imaging results  - CT ABDOMEN PELVIS W CONTRAST; Future - US PELVIC COMPLETE WITH TRANSVAGINAL; Future  3. Grief at loss of child --Declined Integrated Behavioral Health Clinican; pt has a support system in place and speaks with grievance counselor once a week. --Pt asked about BTL reversal, not now but if she and her s/o desired in the future. Questions answered.  Pt will consider but is not ready at this time.

## 2017-12-26 ENCOUNTER — Ambulatory Visit (HOSPITAL_COMMUNITY)
Admission: RE | Admit: 2017-12-26 | Discharge: 2017-12-26 | Disposition: A | Discharge status: Home / Self Care | Payer: Medicaid Other | Source: Ambulatory Visit | Attending: Advanced Practice Midwife | Admitting: Advanced Practice Midwife

## 2017-12-26 DIAGNOSIS — R109 Unspecified abdominal pain: Secondary | ICD-10-CM

## 2017-12-26 DIAGNOSIS — R1031 Right lower quadrant pain: Secondary | ICD-10-CM | POA: Insufficient documentation

## 2017-12-26 DIAGNOSIS — G8918 Other acute postprocedural pain: Secondary | ICD-10-CM | POA: Diagnosis not present

## 2017-12-30 ENCOUNTER — Ambulatory Visit (HOSPITAL_COMMUNITY)
Admission: RE | Admit: 2017-12-30 | Discharge: 2017-12-30 | Disposition: A | Discharge status: Home / Self Care | Payer: Medicaid Other | Source: Ambulatory Visit | Attending: Advanced Practice Midwife | Admitting: Advanced Practice Midwife

## 2017-12-30 DIAGNOSIS — R109 Unspecified abdominal pain: Secondary | ICD-10-CM | POA: Diagnosis present

## 2017-12-30 DIAGNOSIS — R1031 Right lower quadrant pain: Secondary | ICD-10-CM | POA: Diagnosis not present

## 2017-12-30 DIAGNOSIS — G8918 Other acute postprocedural pain: Secondary | ICD-10-CM | POA: Insufficient documentation

## 2017-12-30 MED ORDER — IOPAMIDOL (ISOVUE-370) INJECTION 76%
INTRAVENOUS | Status: AC
  Filled 2017-12-30: qty 100

## 2017-12-30 MED ORDER — IOPAMIDOL (ISOVUE-300) INJECTION 61%
100.0000 mL | Freq: Once | INTRAVENOUS | Status: AC | PRN
  Administered 2017-12-30: 100 mL via INTRAVENOUS

## 2018-01-02 ENCOUNTER — Inpatient Hospital Stay (HOSPITAL_COMMUNITY)
Admission: AD | Admit: 2018-01-02 | Discharge: 2018-01-02 | Discharge status: Against Medical Advice | Payer: Medicaid Other | Source: Ambulatory Visit | Attending: Obstetrics & Gynecology | Admitting: Obstetrics & Gynecology

## 2018-01-02 DIAGNOSIS — R1031 Right lower quadrant pain: Secondary | ICD-10-CM | POA: Diagnosis not present

## 2018-01-02 DIAGNOSIS — Z5321 Procedure and treatment not carried out due to patient leaving prior to being seen by health care provider: Secondary | ICD-10-CM | POA: Insufficient documentation

## 2018-01-02 LAB — URINALYSIS, ROUTINE W REFLEX MICROSCOPIC
Bilirubin Urine: NEGATIVE
Glucose, UA: NEGATIVE mg/dL
Ketones, ur: NEGATIVE mg/dL
Leukocytes, UA: NEGATIVE
NITRITE: NEGATIVE
PH: 5 (ref 5.0–8.0)
Protein, ur: NEGATIVE mg/dL
SPECIFIC GRAVITY, URINE: 1.017 (ref 1.005–1.030)

## 2018-01-02 LAB — POCT PREGNANCY, URINE: Preg Test, Ur: NEGATIVE

## 2018-01-02 NOTE — MAU Note (Signed)
Pt called and not in lobby 

## 2018-01-02 NOTE — MAU Note (Signed)
Pt reports she has had RLQ pain on and off since her tubal ligation in May. Has had a CT and U/S done and still having pain. Pt currently on her period.(pain before period started)

## 2018-01-20 ENCOUNTER — Ambulatory Visit (INDEPENDENT_AMBULATORY_CARE_PROVIDER_SITE_OTHER): Payer: Medicaid Other | Admitting: Nurse Practitioner

## 2018-01-20 ENCOUNTER — Ambulatory Visit: Payer: Medicaid Other | Admitting: Clinical

## 2018-01-20 ENCOUNTER — Encounter: Payer: Self-pay | Admitting: Nurse Practitioner

## 2018-01-20 VITALS — BP 117/68 | HR 72 | Wt 147.6 lb

## 2018-01-20 DIAGNOSIS — F4321 Adjustment disorder with depressed mood: Secondary | ICD-10-CM

## 2018-01-20 DIAGNOSIS — R109 Unspecified abdominal pain: Secondary | ICD-10-CM

## 2018-01-20 DIAGNOSIS — Z634 Disappearance and death of family member: Secondary | ICD-10-CM | POA: Diagnosis not present

## 2018-01-20 DIAGNOSIS — Z712 Person consulting for explanation of examination or test findings: Secondary | ICD-10-CM

## 2018-01-20 NOTE — BH Specialist Note (Signed)
Integrated Behavioral Health Initial Visit  MRN: [id number] Name: Maureen Perez  Number of Integrated Behavioral Health Clinician visits:: 1/6 Session Start time: 4:54  Session End time: 4:59 Total time: 5 minutes  Type of Service: Integrated Behavioral Health- Individual/Family Interpretor:No. Interpretor Name and Language: n/a   Warm Hand Off Completed.       SUBJECTIVE: Maureen Perez is a 32 y.o. female accompanied by n/a Patient was referred by Nolene Bernheimerri Burleson, NP for grief resource. Patient reports the following symptoms/concerns: Pt lost baby at 28 days to SIDS; has had some grief counseling Duration of problem: About 2 months; Severity of problem: moderate  OBJECTIVE: Mood: Depressed and Affect: Tearful Risk of harm to self or others: No plan to harm self or others  LIFE CONTEXT: Family and Social: - School/Work: - Self-Care: - Life Changes: Loss of baby at 28 days to SIDS   ASSESSMENT: Patient currently experiencing Grief.   Patient may benefit from link to additional grief support.  PLAN: 1. Follow up with behavioral health clinician on : As needed 2. Behavioral recommendations:  -When ready, call [facility] for additional grief support 3. Referral(s): Integrated Behavioral Health Services (In Clinic) 4. "From scale of 1-10, how likely are you to follow plan?": -  Rae LipsJamie C Tujuana Kilmartin, LCSW  Depression screen [facility] 10/16/2017 10/02/2017 09/18/2017 08/12/2017 07/11/2017  Decreased Interest 0 0 0 0 0  Down, Depressed, Hopeless 0 0 0 0 0  PHQ - 2 Score 0 0 0 0 0  Altered sleeping 2 1 1  0 1  Tired, decreased energy 2 1 1  0 0  Change in appetite 0 0 0 0 0  Feeling bad or failure about yourself  0 0 0 0 0  Trouble concentrating 0 0 0 0 0  Moving slowly or fidgety/restless 0 0 0 0 0  Suicidal thoughts - 0 0 0 0  PHQ-9 Score 4 2 2  0 1  Some recent data might be hidden   GAD 7 : Generalized Anxiety Score 10/16/2017 10/02/2017 09/18/2017 08/12/2017  Nervous,  Anxious, on Edge 0 0 0 0  Control/stop worrying 0 0 0 0  Worry too much - different things 0 0 0 0  Trouble relaxing 2 1 0 0  Restless 1 1 0 0  Easily annoyed or irritable 1 1 0 0  Afraid - awful might happen 0 0 0 0  Total GAD 7 Score 4 3 0 0    Edinburgh Postnatal Depression Scale - 12/23/17 1650      Edinburgh Postnatal Depression Scale:  In the Past 7 Days   I have been able to laugh and see the funny side of things.  0    I have looked forward with enjoyment to things.  2    I have blamed myself unnecessarily when things went wrong.  2    I have been anxious or worried for no good reason.  0    I have felt scared or panicky for no good reason.  0    Things have been getting on top of me.  1    I have been so unhappy that I have had difficulty sleeping.  3    I have felt sad or miserable.  3    I have been so unhappy that I have been crying.  3    The thought of harming myself has occurred to me.  0    Edinburgh Postnatal Depression Scale Total  14

## 2018-01-20 NOTE — BH Specialist Note (Signed)
Error

## 2018-01-20 NOTE — Progress Notes (Signed)
GYNECOLOGY OFFICE VISIT NOTE   History:  32 y.o. [id number] here today for discussion of her CT results due to periodic abdominal pain.  She has not had pain today or for the past few days.  The pain occurs sometimes when she is lifting a box at work.. She denies any abnormal vaginal discharge, bleeding, pelvic pain or other concerns.  OF NOTE:  Client's baby died of SIDS at 1328 days of life and she has had abdominal pain after  a postpartum BTL.  Past notes reviewed.  Past Medical History:  Diagnosis Date  . Abnormal Pap smear    repeat WNL  . Back pain   . Cholelithiasis   . Depression    no meds since pregancny  . Family history of adverse reaction to anesthesia    " MOTHER TAKES ALONG TIME TO WAKE "  . Gestational diabetes 08/20/2017   gestational - diet controlled  . Hearing loss    as a child - tubes in ear  . Infection    UTI  . Injury of tendon of left rotator cuff   . Obesity   . PONV (postoperative nausea and vomiting)   . Vaginal Pap smear, abnormal     Past Surgical History:  Procedure Laterality Date  . CHOLECYSTECTOMY N/A 12/05/2014   Procedure: LAPAROSCOPIC CHOLECYSTECTOMY WITH INTRAOPERATIVE CHOLANGIOGRAM;  Surgeon: Chevis PrettyPaul Toth III, MD;  Location: [facility] OR;  Service: General;  Laterality: N/A;  . TONSILLECTOMY     age 32  . TUBAL LIGATION Bilateral 10/23/2017   Procedure: POST PARTUM TUBAL LIGATION;  Surgeon: Adam PhenixArnold, James G, MD;  [facility];  Service: Gynecology;  Laterality: Bilateral;  . tubes in ears  age 89    The following portions of the patient's history were reviewed and updated as appropriate: allergies, current medications, past family history, past medical history, past social history, past surgical history and problem list.     Review of Systems:  Pertinent items noted in HPI and remainder of comprehensive ROS otherwise negative.  Objective:  Physical Exam BP 117/68   Pulse 72   Wt 147 lb 9.6 oz (67 kg)   LMP 12/31/2017   BMI  27.89 kg/m  CONSTITUTIONAL: Well-developed, well-nourished female in no acute distress.  HENT:  Normocephalic, atraumatic. External right and left ear normal.  SKIN: Skin is warm and dry. No rash noted. Not diaphoretic. No erythema. No pallor. NEUROLOGIC: Alert and oriented to person, place, and time. Normal muscle tone coordination. No cranial nerve deficit noted. PSYCHIATRIC: Normal mood and affect. Normal behavior. Normal judgment and thought content. Tearful when discussing the loss of her baby to SIDS at 5128 days of age. ABDOMEN: Soft, no distention noted.   PELVIC: Deferred MUSCULOSKELETAL: Normal range of motion. No edema noted.  Labs and Imaging Ct Abdomen Pelvis W Contrast  Result Date: 12/31/2017 CLINICAL DATA:  Right lower quadrant pain. Postpartum, 9 weeks status post vaginal delivery and bilateral tubal ligation. EXAM: CT ABDOMEN AND PELVIS WITH CONTRAST TECHNIQUE: Multidetector CT imaging of the abdomen and pelvis was performed using the standard protocol following bolus administration of intravenous contrast. CONTRAST:  100mL ISOVUE-300 IOPAMIDOL (ISOVUE-300) INJECTION 61% COMPARISON:  None. FINDINGS: Lower Chest: No acute findings. Hepatobiliary: No hepatic masses identified. Prior cholecystectomy. No evidence of biliary obstruction. Pancreas:  No mass or inflammatory changes. Spleen: Within normal limits in size and appearance. Adrenals/Urinary Tract: No masses identified. No evidence of hydronephrosis. Normal appearance of ureters and urinary bladder. No evidence of contrast  extravasation. Stomach/Bowel: No evidence of obstruction, inflammatory process or abnormal fluid collections. Normal appendix visualized. Vascular/Lymphatic: No pathologically enlarged lymph nodes. No abdominal aortic aneurysm. Congenital duplication of IVC incidentally noted. Reproductive: Normal appearance of uterus. Clips are seen from bilateral tubal ligation. Adnexal regions are otherwise unremarkable. No  evidence of mass, inflammatory process, or hematoma. Other:  None. Musculoskeletal:  No suspicious bone lesions identified. IMPRESSION: No acute findings or other significant abnormality identified. Electronically Signed   By: Myles Rosenthal [name]   On: 12/31/2017 10:42   US Pelvic Complete With Transvaginal  Result Date: 12/26/2017 CLINICAL DATA:  Right-sided pelvic pain. Two months status post vaginal delivery and bilateral tubal ligation. EXAM: TRANSABDOMINAL AND TRANSVAGINAL ULTRASOUND OF PELVIS TECHNIQUE: Both transabdominal and transvaginal ultrasound examinations of the pelvis were performed. Transabdominal technique was performed for global imaging of the pelvis including uterus, ovaries, adnexal regions, and pelvic cul-de-sac. It was necessary to proceed with endovaginal exam following the transabdominal exam to visualize the endometrium and ovaries. COMPARISON:  None FINDINGS: Uterus Measurements: 9.6 x 5.6 x 6.5 cm. No fibroids or other mass visualized. Endometrium Thickness: 15 mm.  No focal abnormality visualized. Right ovary Measurements: 3.2 x 1.6 x 1.9 cm. Normal appearance/no adnexal mass. Left ovary Measurements: 3.8 x 2.1 x 2.5 cm. Normal appearance/no adnexal mass. Other findings No abnormal free fluid. IMPRESSION: Negative. No pelvic mass or other significant abnormality identified. Electronically Signed   By: Myles Rosenthal [name]   On: 12/26/2017 11:44    Assessment & Plan:  1. Encounter to discuss test results Reviewed CT scan and no problems found.  She is not having pain today.  Demonstrated standing exercise to strengthen abdominal core and client was able to perform the exercise with no pain.  2.  Grief at loss of baby  - Asher Muir, [facility] Specialist gave her info on Heartstrings.  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  Routine preventative health maintenance measures emphasized. Please refer to After Visit Summary for other counseling recommendations.   Return in about 11 months  (around 12/16/2018) for annual exam.   Total face-to-face time with patient: 15 minutes.  Over 50% of encounter was spent on counseling and coordination of care.  Nolene Bernheim, RN, MSN, NP-BC Nurse Practitioner, [facility] for [employer], [facility] 01/20/2018 4:55 PM

## 2018-03-23 ENCOUNTER — Ambulatory Visit (HOSPITAL_COMMUNITY)
Admission: EM | Admit: 2018-03-23 | Discharge: 2018-03-23 | Disposition: A | Discharge status: Home / Self Care | Payer: Medicaid Other | Attending: Family Medicine | Admitting: Family Medicine

## 2018-03-23 ENCOUNTER — Encounter (HOSPITAL_COMMUNITY): Payer: Self-pay

## 2018-03-23 ENCOUNTER — Other Ambulatory Visit: Payer: Self-pay

## 2018-03-23 DIAGNOSIS — J020 Streptococcal pharyngitis: Secondary | ICD-10-CM

## 2018-03-23 LAB — POCT RAPID STREP A: Streptococcus, Group A Screen (Direct): POSITIVE — AB

## 2018-03-23 MED ORDER — IBUPROFEN 600 MG PO TABS: 600.0000 mg | ORAL_TABLET | Freq: Three times a day (TID) | ORAL | 0 refills | Status: DC | PRN

## 2018-03-23 MED ORDER — AMOXICILLIN 500 MG PO CAPS: 500.0000 mg | ORAL_CAPSULE | Freq: Two times a day (BID) | ORAL | 0 refills | Status: AC

## 2018-03-23 NOTE — ED Triage Notes (Signed)
Pt states she has sore throat and body aches x 1 week.

## 2018-03-23 NOTE — Discharge Instructions (Addendum)
Push fluids to ensure adequate hydration and keep secretions thin.  Tylenol and/or ibuprofen as needed for pain or fevers.  Throat lozenges, gargles, chloraseptic spray, warm teas, popsicles etc to help with throat pain.   Complete course of antibiotics.  Rest. Decrease to quit smoking.  May use over the counter treatments as needed for symptoms.  If symptoms worsen or do not improve in the next week to return to be seen or to follow up with your PCP.

## 2018-03-23 NOTE — ED Provider Notes (Signed)
[facility]    CSN: [id number] Arrival date & time: 03/23/18  1859     History   Chief Complaint Chief Complaint  Patient presents with  . Sore Throat    HPI Maureen Perez is a 32 y.o. female.   Maureen Perez presents with complaints of sore throat, body aches and cough. States started as sore throat 1 week ago but has developed body aches and cough over the past few days. Has felt hot and cold but no specific fevers. No runny nose. Right ear pain. Nausea, no vomiting or diarrhea. Had a coworker with the flu. No rash. Has not taken any medications for symptoms. Smokes 1ppd. Hx of depression, gerd, tonsillectomy.    ROS per HPI.      Past Medical History:  Diagnosis Date  . Abnormal Pap smear    repeat WNL  . Back pain   . Cholelithiasis   . Depression    no meds since pregancny  . Family history of adverse reaction to anesthesia    " MOTHER TAKES ALONG TIME TO WAKE "  . Gestational diabetes 08/20/2017   gestational - diet controlled  . Hearing loss    as a child - tubes in ear  . Infection    UTI  . Injury of tendon of left rotator cuff   . Obesity   . PONV (postoperative nausea and vomiting)   . Vaginal Pap smear, abnormal     Patient Active Problem List   Diagnosis Date Noted  . Grief at loss of child 12/23/2017  . Rampant dental caries 04/10/2017  . GERD (gastroesophageal reflux disease) 06/15/2013  . DEPRESSION, MODERATE, RECURRENT 03/20/2010  . 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 INSOMNIA 01/06/2009  . TOBACCO ABUSE 11/22/2008  . ALLERGIC RHINITIS DUE TO ANIMAL HAIR AND DANDER 11/22/2008  . OBESITY 10/20/2008    Past Surgical History:  Procedure Laterality Date  . CHOLECYSTECTOMY N/A 12/05/2014   Procedure: LAPAROSCOPIC CHOLECYSTECTOMY WITH INTRAOPERATIVE CHOLANGIOGRAM;  Surgeon: Chevis Pretty III, MD;  Location: [facility];  Service: General;  Laterality: N/A;  . TONSILLECTOMY     age 78  . TUBAL LIGATION Bilateral 10/23/2017   Procedure:  POST PARTUM TUBAL LIGATION;  Surgeon: Adam Phenix, MD;  Location: [facility];  Service: Gynecology;  Laterality: Bilateral;  . tubes in ears  age 31    OB History    Gravida  5   Para  3   Term  3   Preterm  0   AB  2   Living  3     SAB  2   TAB  0   Ectopic  0   Multiple  0   Live Births  3            Home Medications    Prior to Admission medications   Medication Sig Start Date End Date Taking? Authorizing Provider  acetaminophen (TYLENOL) 500 MG tablet Take 1,000 mg by mouth every 6 (six) hours as needed.    [provider]  ibuprofen (ADVIL,MOTRIN) 600 MG tablet Take 1 tablet (600 mg total) by mouth every 8 (eight) hours as needed for moderate pain. 03/23/18   Georgetta Haber, NP    Family History Family History  Problem Relation Age of Onset  . Depression Mother   . Diabetes Mother   . Hypertension Mother   . COPD Mother   . Anesthesia problems Mother        problems waking and  nausea and vomitting  . Depression Sister   . Diabetes Sister   . Kidney disease Sister   . Hypertension Sister   . Hypertension Father   . COPD Father   . Anesthesia problems Father        N&V    Social History Social History   Tobacco Use  . Smoking status: Current Every Day Smoker    Packs/day: 0.50    Years: 10.00    Pack years: 5.00    Types: Cigarettes  . Smokeless tobacco: Never Used  Substance Use Topics  . Alcohol use: No    Alcohol/week: 0.0 standard drinks  . Drug use: No    Comment: not any more     Allergies   Cephalexin and Percocet [oxycodone-acetaminophen]   Review of Systems Review of Systems   Physical Exam Triage Vital Signs ED Triage Vitals  Enc Vitals Group     BP 03/23/18 1957 132/89     Pulse Rate 03/23/18 1957 82     Resp 03/23/18 1957 18     Temp 03/23/18 1957 98.3 F (36.8 C)     Temp Source 03/23/18 1957 Oral     SpO2 03/23/18 1957 98 %     Weight 03/23/18 1956 148 lb (67.1 kg)     Height --        Head Circumference --      Peak Flow --      Pain Score 03/23/18 1955 9     Pain Loc --      Pain Edu? --      Excl. in GC? --    No data found.  Updated Vital Signs BP 132/89 (BP Location: Right Arm)   Pulse 82   Temp 98.3 F (36.8 C) (Oral)   Resp 18   Wt 148 lb (67.1 kg)   LMP 03/23/2018   SpO2 98%   BMI 27.96 kg/m    Physical Exam  Constitutional: She is oriented to person, place, and time. She appears well-developed and well-nourished. No distress.  HENT:  Head: Normocephalic and atraumatic.  Right Ear: Tympanic membrane, external ear and ear canal normal.  Left Ear: Tympanic membrane, external ear and ear canal normal.  Nose: Nose normal.  Mouth/Throat: Uvula is midline, oropharynx is clear and moist and mucous membranes are normal. Tonsils are 0 on the right. Tonsils are 0 on the left. No tonsillar exudate.  Eyes: Pupils are equal, round, and reactive to light. Conjunctivae and EOM are normal.  Cardiovascular: Normal rate, regular rhythm and normal heart sounds.  Pulmonary/Chest: Effort normal and breath sounds normal.  Lymphadenopathy:    She has no cervical adenopathy.  Neurological: She is alert and oriented to person, place, and time.  Skin: Skin is warm and dry.     UC Treatments / Results  Labs (all labs ordered are listed, but only abnormal results are displayed) Labs Reviewed - No data to display  EKG None  Radiology No results found.  Procedures Procedures (including critical care time)  Medications Ordered in UC Medications - No data to display  Initial Impression / Assessment and Plan / UC Course  I have reviewed the triage vital signs and the nursing notes.  Pertinent labs & imaging results that were available during my care of the patient were reviewed by me and considered in my medical decision making (see chart for details).    Positive rapid strep. Course of amoxicillin provided. If symptoms worsen or do not improve in  the  next week to return to be seen or to follow up with PCP.  Patient verbalized understanding and agreeable to plan.   Final Clinical Impressions(s) / UC Diagnoses   Final diagnoses:  Viral URI with cough     Discharge Instructions     Push fluids to ensure adequate hydration and keep secretions thin.  Tylenol and/or ibuprofen as needed for pain or fevers.  Throat lozenges, gargles, chloraseptic spray, warm teas, popsicles etc to help with throat pain.   Rest. Decrease to quit smoking.  May use over the counter treatments as needed for symptoms.  If symptoms worsen or do not improve in the next week to return to be seen or to follow up with your PCP.     ED Prescriptions    Medication Sig Dispense Auth. Provider   ibuprofen (ADVIL,MOTRIN) 600 MG tablet Take 1 tablet (600 mg total) by mouth every 8 (eight) hours as needed for moderate pain. 30 tablet Georgetta Haber, NP     Controlled Substance Prescriptions [place] Controlled Substance Registry consulted? Not Applicable   Georgetta Haber, NP 03/23/18 2035

## 2018-08-09 IMAGING — US US MFM OB FOLLOW-UP
1 series · 14 of 28 positions shown · non-contrast
Comparison: none

[Series 1: us mfm ob follow-up · 51 acquisitions, 14 frames shown]
[im 2/51]
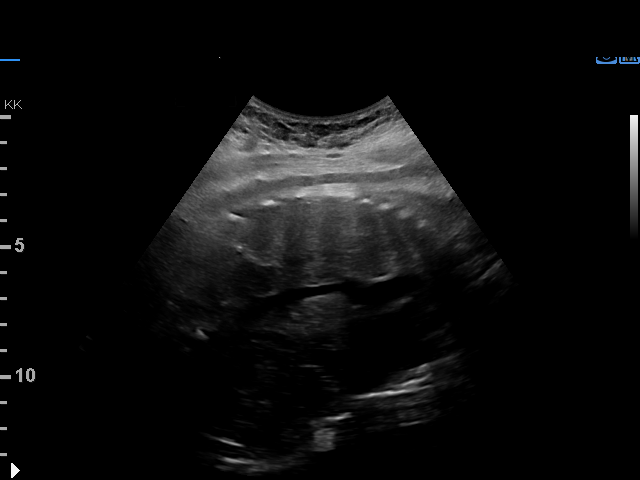
[im 6/51]
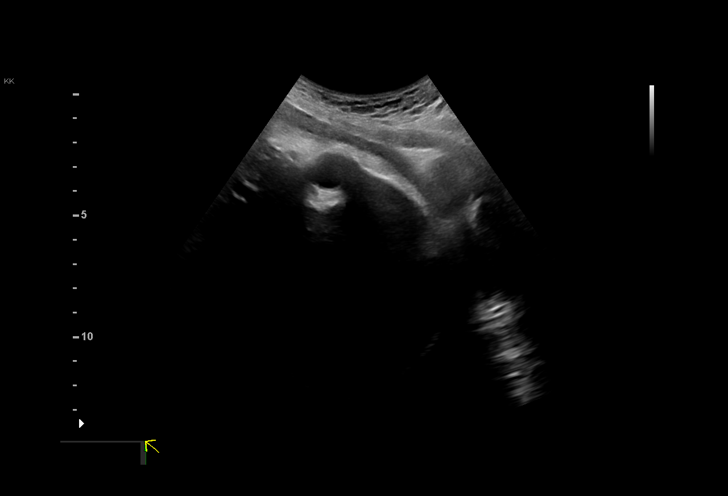
[im 10/51]
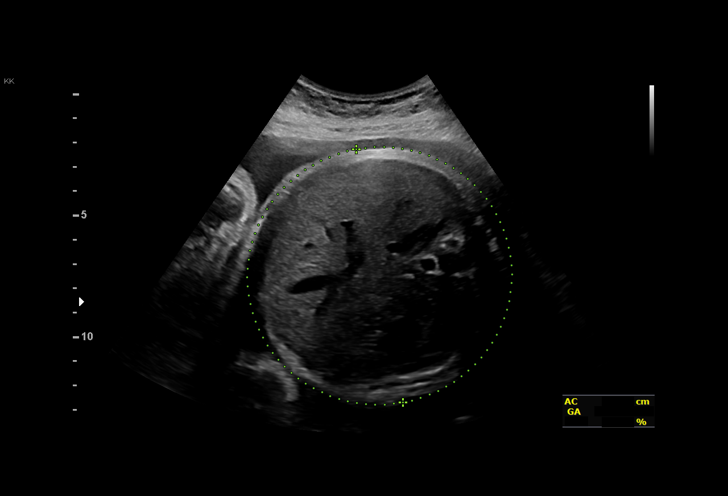
[im 13/51]
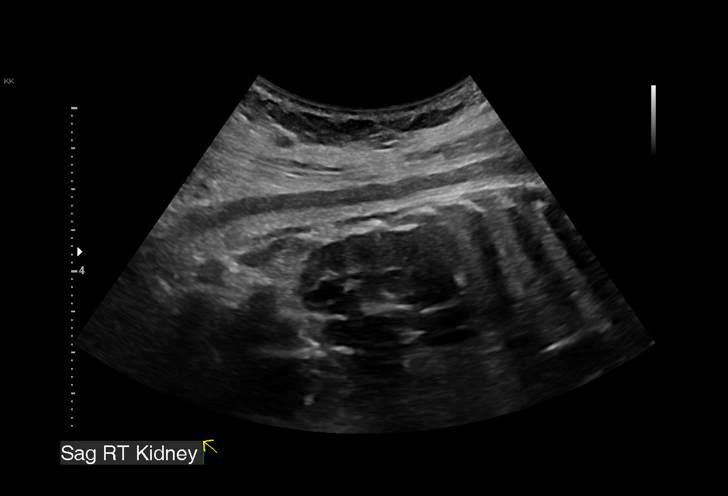
[im 17/51]
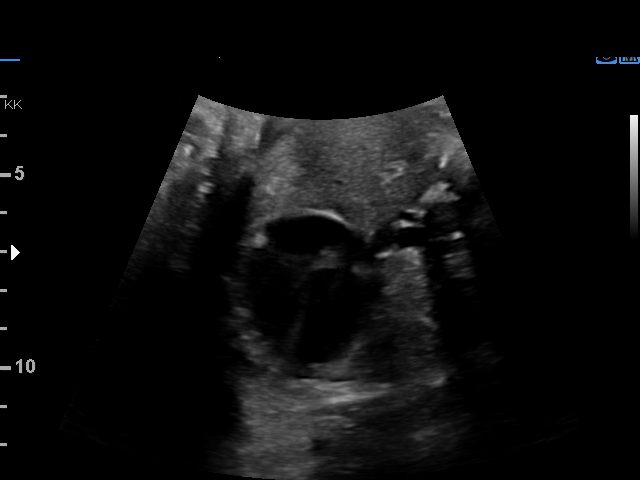
[im 21/51]
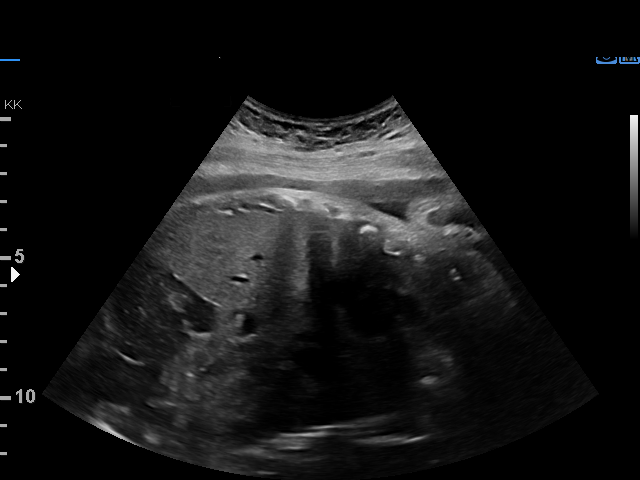
[im 25/51]
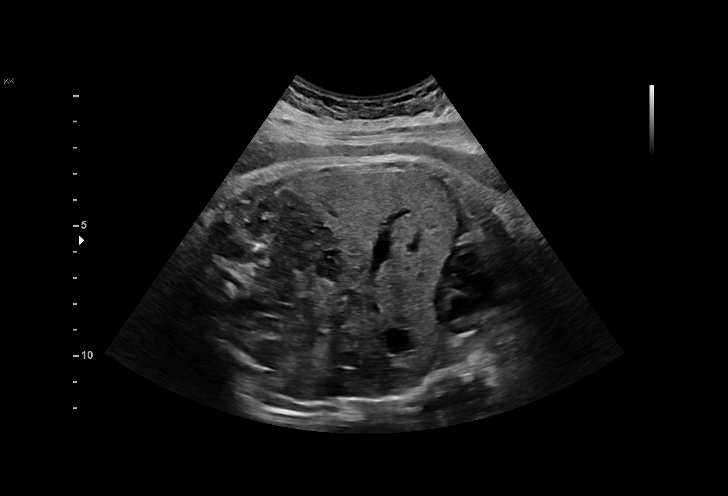
[im 28/51]
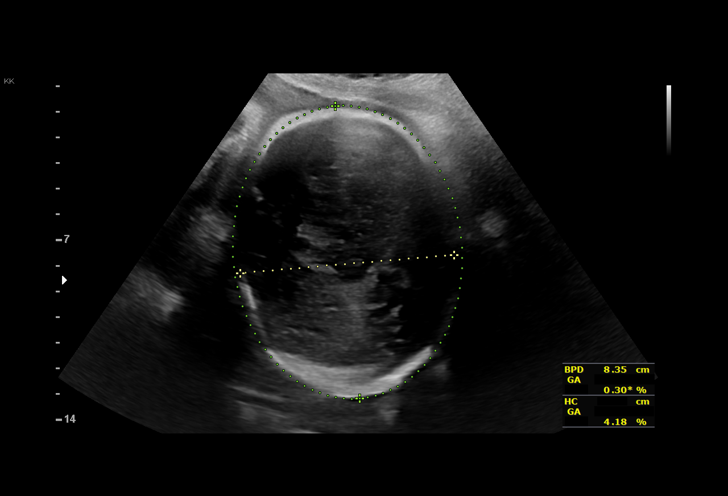
[im 32/51]
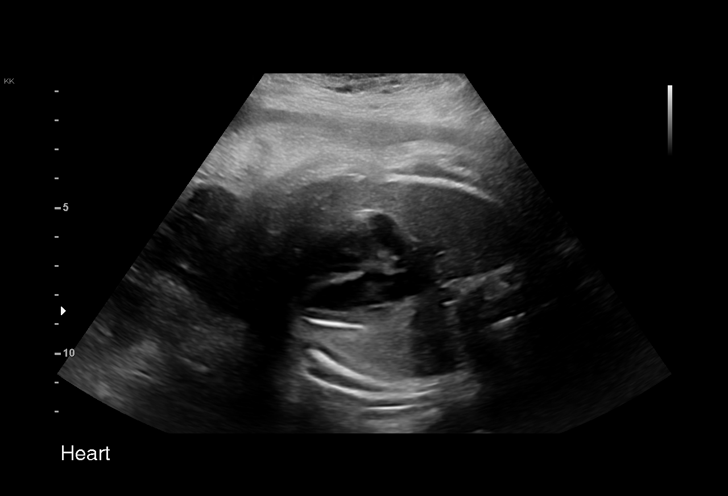
[im 36/51]
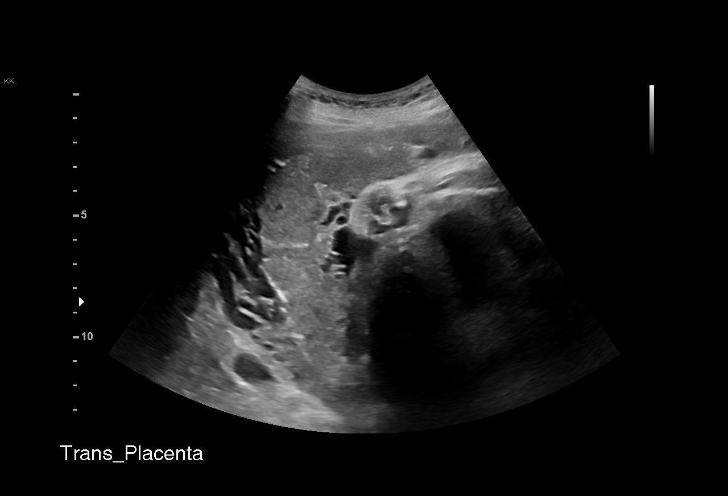
[im 39/51]
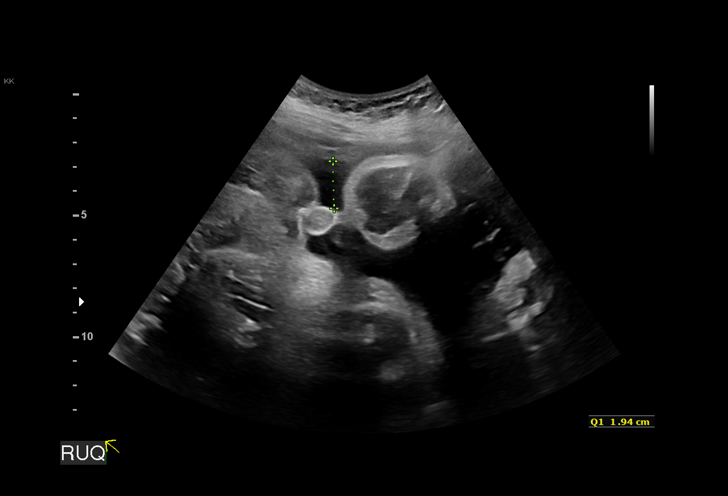
[im 43/51]
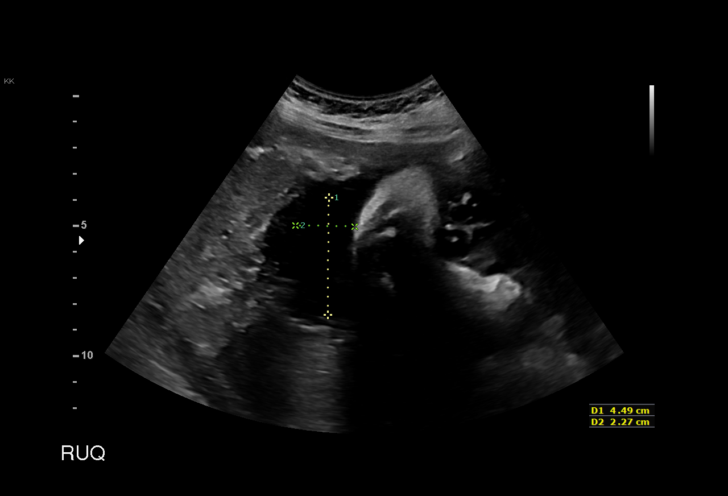
[im 47/51]
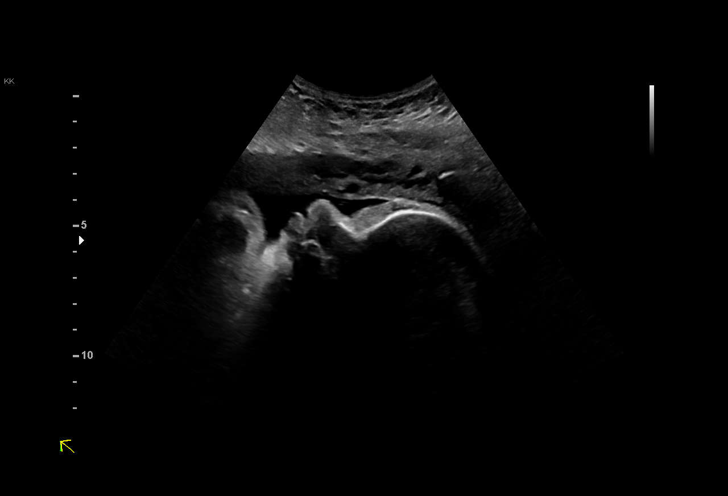
[im 51/51]
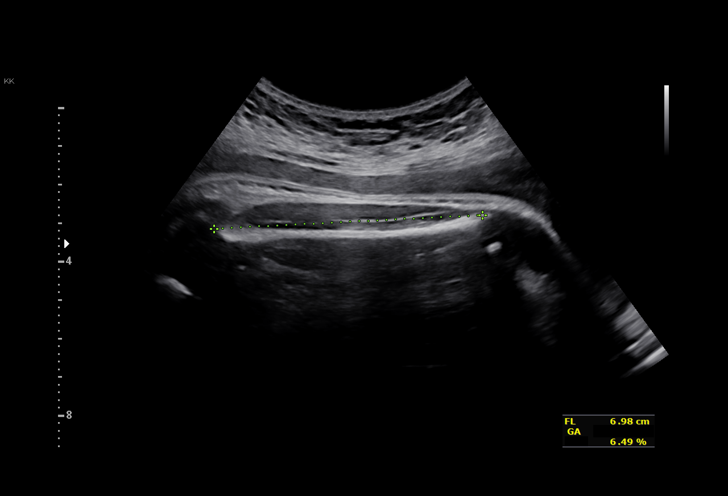

[14 of 28 positions shown; findings below may reference images not displayed]

OB/Gyn Clinic
[REDACTED]

1  LEYDY GIOVANNI          [id number]      [id number]     [id number]
Indications

38 weeks gestation of pregnancy
Smoking complicating pregnancy, second
trimester
Gestational diabetes in pregnancy, diet
controlled
OB History

Blood Type:            Height:  5'1"   Weight (lb):  166       BMI:
Gravidity:    5         Term:   2         SAB:   2
Living:       2
Fetal Evaluation

Num Of Fetuses:     1
Fetal Heart         140
Rate(bpm):
Cardiac Activity:   Observed
Presentation:       Cephalic
Placenta:           Posterior, above cervical os
P. Cord Insertion:  Previously Visualized
Amniotic Fluid
AFI FV:      Subjectively within normal limits

AFI Sum(cm)     %Tile       Largest Pocket(cm)
9.59            23

RUQ(cm)       RLQ(cm)                      LLQ(cm)
4.49
Biometry

BPD:      84.9  mm     G. Age:  34w 1d          1  %    CI:        68.92   %    70 - 86
FL/HC:      21.4   %    20.9 -
HC:      326.7  mm     G. Age:  37w 0d         12  %    HC/AC:      0.98        0.92 -
AC:      333.2  mm     G. Age:  37w 2d         43  %    FL/BPD:     82.2   %    71 - 87
FL:       69.8  mm     G. Age:  35w 6d          8  %    FL/AC:      20.9   %    20 - 24
Est. FW:    5892  gm      6 lb 8 oz     41  %
Gestational Age

LMP:           38w 1d        Date:  01/22/17                 EDD:   10/29/17
U/S Today:     36w 1d                                        EDD:   11/12/17
Best:          38w 1d     Det. By:  LMP  (01/22/17)          EDD:   10/29/17
Anatomy

Cranium:               Appears normal         Aortic Arch:            Previously seen
Cavum:                 Previously seen        Ductal Arch:            Previously seen
Ventricles:            Appears normal         Diaphragm:              Appears normal
Choroid Plexus:        Previously seen        Stomach:                Previously Seen
Cerebellum:            Previously seen        Abdomen:                Appears normal
Posterior Fossa:       Previously seen        Abdominal Wall:         Previously seen
Nuchal Fold:           Previously seen        Cord Vessels:           Previously seen
Face:                  Orbits and profile     Kidneys:                Appear normal
previously seen
Lips:                  Previously seen        Bladder:                Appears normal
Thoracic:              Appears normal         Spine:                  Previously seen
Heart:                 Appears normal         Upper Extremities:      Previously seen
(4CH, axis, and situs
RVOT:                  Previously seen        Lower Extremities:      Previously seen
LVOT:                  Previously seen

Other:  Male gender previously seen. Heels and 5th digit previously seen.
Cervix Uterus Adnexa

Cervix
Not visualized (advanced GA >88wks)
Impression

Singleton intrauterine pregnancy at 38+1 weeks with
borderline fluid last week here for repeat evaluation and
growth scan
Interval review of the anatomy shows no sonographic
markers for aneuploidy or structural anomalies
All relevant fetal anatomy has been visualized
Amniotic fluid volume is normal
Estimated fetal weight shows growth in the 41st percentile
Recommendations

Follow-up ultrasounds as clinically indicated.

## 2018-08-20 ENCOUNTER — Emergency Department (HOSPITAL_COMMUNITY): Payer: Self-pay

## 2018-08-20 ENCOUNTER — Emergency Department (HOSPITAL_COMMUNITY)
Admission: EM | Admit: 2018-08-20 | Discharge: 2018-08-20 | Disposition: A | Discharge status: Home / Self Care | Payer: Self-pay | Attending: Emergency Medicine | Admitting: Emergency Medicine

## 2018-08-20 ENCOUNTER — Other Ambulatory Visit: Payer: Self-pay

## 2018-08-20 ENCOUNTER — Encounter (HOSPITAL_COMMUNITY): Payer: Self-pay | Admitting: *Deleted

## 2018-08-20 DIAGNOSIS — M545 Low back pain, unspecified: Secondary | ICD-10-CM

## 2018-08-20 DIAGNOSIS — F17210 Nicotine dependence, cigarettes, uncomplicated: Secondary | ICD-10-CM | POA: Insufficient documentation

## 2018-08-20 DIAGNOSIS — Z79899 Other long term (current) drug therapy: Secondary | ICD-10-CM | POA: Insufficient documentation

## 2018-08-20 LAB — POC URINE PREG, ED: Preg Test, Ur: NEGATIVE

## 2018-08-20 MED ORDER — IBUPROFEN 800 MG PO TABS
800.0000 mg | ORAL_TABLET | Freq: Once | ORAL | Status: AC
  Administered 2018-08-20: 800 mg via ORAL
  Filled 2018-08-20: qty 1

## 2018-08-20 MED ORDER — IBUPROFEN 600 MG PO TABS: 600.0000 mg | ORAL_TABLET | Freq: Four times a day (QID) | ORAL | 0 refills | Status: DC | PRN

## 2018-08-20 MED ORDER — TIZANIDINE HCL 4 MG PO TABS: 4.0000 mg | ORAL_TABLET | Freq: Four times a day (QID) | ORAL | 0 refills | Status: DC | PRN

## 2018-08-20 NOTE — ED Notes (Signed)
Patient verbalizes understanding of discharge instructions . Opportunity for questions and answers were provided . Armband removed by staff ,Pt discharged from ED. W/C  offered at D/C  and Declined W/C at D/C and was escorted to lobby by RN.  

## 2018-08-20 NOTE — ED Provider Notes (Signed)
[facility] EMERGENCY DEPARTMENT Provider Note   CSN: [id number] Arrival date & time: 08/20/18  1551    History   Chief Complaint Chief Complaint  Patient presents with  . Back Pain    HPI Maureen Perez is a 33 y.o. female.     The history is provided by the patient. No language interpreter was used.  Back Pain   Maureen Perez is a 33 y.o. female who presents to the Emergency Department complaining of back pain. Presents to the emergency department for evaluation of progressive low back pain. Three months ago she fell down some stairs while she was taking out the laundry and fell on her tailbone. She did not have any pain at that time but about two months ago developed pain located in her lower back and upper sacral region. Pain is described as constant and worse with sitting. At times she feels like she needs to move from side to side with sitting. Recently she is now having pain on ambulation. She denies any fevers, chills, nausea, vomiting, diarrhea, constipation, dysuria, urinary incontinence/retention. She denies any numbness, weakness. She has no medical problems and takes no medications. She has taken Tylenol without any improvement in her symptoms. No IV drug use. She does have a history of back pain higher in her back but not in this location. Past Medical History:  Diagnosis Date  . Abnormal Pap smear    repeat WNL  . Back pain   . Cholelithiasis   . Depression    no meds since pregancny  . Family history of adverse reaction to anesthesia    " MOTHER TAKES ALONG TIME TO WAKE "  . Gestational diabetes 08/20/2017   gestational - diet controlled  . Hearing loss    as a child - tubes in ear  . Infection    UTI  . Injury of tendon of left rotator cuff   . Obesity   . PONV (postoperative nausea and vomiting)   . Vaginal Pap smear, abnormal     Patient Active Problem List   Diagnosis Date Noted  . Grief at loss of child 12/23/2017  . Rampant  dental caries 04/10/2017  . GERD (gastroesophageal reflux disease) 06/15/2013  . DEPRESSION, MODERATE, RECURRENT 03/20/2010  . 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 INSOMNIA 01/06/2009  . TOBACCO ABUSE 11/22/2008  . ALLERGIC RHINITIS DUE TO ANIMAL HAIR AND DANDER 11/22/2008  . OBESITY 10/20/2008    Past Surgical History:  Procedure Laterality Date  . CHOLECYSTECTOMY N/A 12/05/2014   Procedure: LAPAROSCOPIC CHOLECYSTECTOMY WITH INTRAOPERATIVE CHOLANGIOGRAM;  Surgeon: Chevis Pretty III, MD;  Location: [facility] OR;  Service: General;  Laterality: N/A;  . TONSILLECTOMY     age 55  . TUBAL LIGATION Bilateral 10/23/2017   Procedure: POST PARTUM TUBAL LIGATION;  Surgeon: Adam Phenix, MD;  Location: [facility] BIRTHING SUITES;  Service: Gynecology;  Laterality: Bilateral;  . tubes in ears  age 32     OB History    Gravida  5   Para  3   Term  3   Preterm  0   AB  2   Living  3     SAB  2   TAB  0   Ectopic  0   Multiple  0   Live Births  3            Home Medications    Prior to Admission medications   Medication Sig Start Date End Date Taking?  Authorizing Provider  acetaminophen (TYLENOL) 500 MG tablet Take 1,000 mg by mouth every 6 (six) hours as needed.    [provider]  ibuprofen (ADVIL,MOTRIN) 600 MG tablet Take 1 tablet (600 mg total) by mouth every 6 (six) hours as needed. 08/20/18   Tilden Fossa, MD  tiZANidine (ZANAFLEX) 4 MG tablet Take 1 tablet (4 mg total) by mouth every 6 (six) hours as needed for muscle spasms. 08/20/18   Tilden Fossa, MD    Family History Family History  Problem Relation Age of Onset  . Depression Mother   . Diabetes Mother   . Hypertension Mother   . COPD Mother   . Anesthesia problems Mother        problems waking and nausea and vomitting  . Depression Sister   . Diabetes Sister   . Kidney disease Sister   . Hypertension Sister   . Hypertension Father   . COPD Father   . Anesthesia problems Father        N&V     Social History Social History   Tobacco Use  . Smoking status: Current Every Day Smoker    Packs/day: 0.50    Years: 10.00    Pack years: 5.00    Types: Cigarettes  . Smokeless tobacco: Never Used  Substance Use Topics  . Alcohol use: No    Alcohol/week: 0.0 standard drinks  . Drug use: No    Comment: not any more     Allergies   Cephalexin and Percocet [oxycodone-acetaminophen]   Review of Systems Review of Systems  Musculoskeletal: Positive for back pain.  All other systems reviewed and are negative.    Physical Exam Updated Vital Signs BP 120/68 (BP Location: Right Arm)   Pulse 76   Temp 98.1 F (36.7 C) (Oral)   Resp 16   Ht  (1.549 m)   Wt 70.8 kg   LMP 07/20/2018   SpO2 100%   BMI 29.48 kg/m   Physical Exam Vitals signs and nursing note reviewed.  Constitutional:      Appearance: She is well-developed.  HENT:     Head: Normocephalic and atraumatic.  Cardiovascular:     Rate and Rhythm: Normal rate and regular rhythm.     Heart sounds: No murmur.  Pulmonary:     Effort: Pulmonary effort is normal. No respiratory distress.     Breath sounds: Normal breath sounds.  Abdominal:     Palpations: Abdomen is soft.     Tenderness: There is no abdominal tenderness. There is no guarding or rebound.  Musculoskeletal:     Comments: Tender to palpation over the lower lumbar/upper sacral region. No overlying rashes or erythema. 2+ DP pulses bilaterally  Skin:    General: Skin is warm and dry.  Neurological:     Mental Status: She is alert and oriented to person, place, and time.     Comments: Normal gait. Five out of five strength in all four extremities with sensation to light touch intact in all four extremities  Psychiatric:        Behavior: Behavior normal.      ED Treatments / Results  Labs (all labs ordered are listed, but only abnormal results are displayed) Labs Reviewed  POC URINE PREG, ED    EKG None  Radiology Dg Lumbar  Spine Complete  Result Date: 08/20/2018 CLINICAL DATA:  Back pain for 3 months. Fell down 8 stairs 3 months ago. EXAM: LUMBAR SPINE - COMPLETE 4+ VIEW COMPARISON:  Plain film of the abdomen dated 05/08/2010. CT abdomen dated 12/30/2017. FINDINGS: Alignment is normal. No fracture line or displaced fracture fragment seen. No compression fracture deformity. Disc spaces are well maintained in height. Upper sacrum appears intact and normally aligned. No evidence of pars interarticularis defect seen. Visualized paravertebral soft tissues are unremarkable. Cholecystectomy clips in the RIGHT upper quadrant and tubal ligation clips in the pelvis. IMPRESSION: Negative. Electronically Signed   By: Bary Richard [name]   On: 08/20/2018 18:01    Procedures Procedures (including critical care time)  Medications Ordered in ED Medications  ibuprofen (ADVIL,MOTRIN) tablet 800 mg (800 mg Oral Given 08/20/18 1616)     Initial Impression / Assessment and Plan / ED Course  I have reviewed the triage vital signs and the nursing notes.  Pertinent labs & imaging results that were available during my care of the patient were reviewed by me and considered in my medical decision making (see chart for details).        Patient here for evaluation of midline low back pain that occurred following a fall about three months ago. She does have pain in the midline on examination without external evidence of trauma. She is NVI on exam. Presentation is not consistent with renal colic, UTI, epidural abscess. Discussed with patient home care for musculoskeletal back pain. Discussed outpatient follow-up and return precautions.  Final Clinical Impressions(s) / ED Diagnoses   Final diagnoses:  Acute midline low back pain without sciatica    ED Discharge Orders         Ordered    ibuprofen (ADVIL,MOTRIN) 600 MG tablet  Every 6 hours PRN     08/20/18 1828    tiZANidine (ZANAFLEX) 4 MG tablet  Every 6 hours PRN     08/20/18 1828            Tilden Fossa, MD 08/20/18 [phone]

## 2018-08-20 NOTE — ED Triage Notes (Signed)
PT reports major back pain for 3 months. Pt reports back hurts when standing and walking. Pt reports falling down 8 stairs 3 months ago . Back pain started later .

## 2018-11-19 ENCOUNTER — Other Ambulatory Visit: Payer: Self-pay

## 2018-11-19 ENCOUNTER — Emergency Department (HOSPITAL_COMMUNITY)
Admission: EM | Admit: 2018-11-19 | Discharge: 2018-11-19 | Disposition: A | Discharge status: Home / Self Care | Payer: No Typology Code available for payment source | Attending: Emergency Medicine | Admitting: Emergency Medicine

## 2018-11-19 DIAGNOSIS — M7918 Myalgia, other site: Secondary | ICD-10-CM | POA: Diagnosis not present

## 2018-11-19 DIAGNOSIS — M542 Cervicalgia: Secondary | ICD-10-CM | POA: Insufficient documentation

## 2018-11-19 MED ORDER — CYCLOBENZAPRINE HCL 10 MG PO TABS
10.0000 mg | ORAL_TABLET | Freq: Once | ORAL | Status: AC
  Administered 2018-11-19: 10 mg via ORAL
  Filled 2018-11-19: qty 1

## 2018-11-19 MED ORDER — CYCLOBENZAPRINE HCL 10 MG PO TABS: 10.0000 mg | ORAL_TABLET | Freq: Two times a day (BID) | ORAL | 0 refills | Status: DC | PRN

## 2018-11-19 MED ORDER — HYDROCODONE-ACETAMINOPHEN 5-325 MG PO TABS: 1.0000 | ORAL_TABLET | ORAL | 0 refills | Status: DC | PRN

## 2018-11-19 MED ORDER — IBUPROFEN 600 MG PO TABS: 600.0000 mg | ORAL_TABLET | Freq: Four times a day (QID) | ORAL | 0 refills | Status: DC | PRN

## 2018-11-19 MED ORDER — IBUPROFEN 400 MG PO TABS
600.0000 mg | ORAL_TABLET | Freq: Once | ORAL | Status: AC
  Administered 2018-11-19: 600 mg via ORAL
  Filled 2018-11-19: qty 1
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
## 2018-11-19 NOTE — Discharge Instructions (Addendum)
Take medications as prescribed. Cool compresses to the sore areas. If pain is no better in one week, follow up for further evaluation.
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
## 2018-11-22 ENCOUNTER — Other Ambulatory Visit: Payer: Self-pay

## 2018-11-22 ENCOUNTER — Encounter (HOSPITAL_COMMUNITY): Payer: Self-pay | Admitting: Emergency Medicine

## 2018-11-22 ENCOUNTER — Emergency Department (HOSPITAL_COMMUNITY)
Admission: EM | Admit: 2018-11-22 | Discharge: 2018-11-22 | Disposition: A | Discharge status: Home / Self Care | Payer: Self-pay | Attending: Emergency Medicine | Admitting: Emergency Medicine

## 2018-11-22 DIAGNOSIS — S29012D Strain of muscle and tendon of back wall of thorax, subsequent encounter: Secondary | ICD-10-CM | POA: Insufficient documentation

## 2018-11-22 DIAGNOSIS — T148XXA Other injury of unspecified body region, initial encounter: Secondary | ICD-10-CM

## 2018-11-22 DIAGNOSIS — Y929 Unspecified place or not applicable: Secondary | ICD-10-CM | POA: Insufficient documentation

## 2018-11-22 DIAGNOSIS — S161XXD Strain of muscle, fascia and tendon at neck level, subsequent encounter: Secondary | ICD-10-CM | POA: Insufficient documentation

## 2018-11-22 DIAGNOSIS — F17210 Nicotine dependence, cigarettes, uncomplicated: Secondary | ICD-10-CM | POA: Insufficient documentation

## 2018-11-22 DIAGNOSIS — Y939 Activity, unspecified: Secondary | ICD-10-CM | POA: Insufficient documentation

## 2018-11-22 DIAGNOSIS — Y999 Unspecified external cause status: Secondary | ICD-10-CM | POA: Insufficient documentation

## 2018-11-22 MED ORDER — METHOCARBAMOL 500 MG PO TABS: 500.0000 mg | ORAL_TABLET | Freq: Two times a day (BID) | ORAL | 0 refills | Status: DC
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
## 2018-11-30 ENCOUNTER — Encounter (HOSPITAL_COMMUNITY): Payer: Self-pay

## 2018-11-30 ENCOUNTER — Ambulatory Visit (HOSPITAL_COMMUNITY)
Admission: EM | Admit: 2018-11-30 | Discharge: 2018-11-30 | Disposition: A | Discharge status: Home / Self Care | Payer: Self-pay | Attending: Family Medicine | Admitting: Family Medicine

## 2018-11-30 ENCOUNTER — Other Ambulatory Visit: Payer: Self-pay

## 2018-11-30 DIAGNOSIS — S161XXD Strain of muscle, fascia and tendon at neck level, subsequent encounter: Secondary | ICD-10-CM
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
## 2018-11-30 NOTE — Discharge Instructions (Signed)
May continue medications as needed. Continue stretches and ice/heat.

## 2019-09-29 ENCOUNTER — Emergency Department (HOSPITAL_COMMUNITY)
Admission: EM | Admit: 2019-09-29 | Discharge: 2019-09-30 | Disposition: A | Discharge status: Home / Self Care | Payer: Self-pay | Attending: Emergency Medicine | Admitting: Emergency Medicine

## 2019-09-29 ENCOUNTER — Encounter (HOSPITAL_COMMUNITY): Payer: Self-pay | Admitting: *Deleted

## 2019-09-29 ENCOUNTER — Ambulatory Visit (HOSPITAL_COMMUNITY)
Admission: EM | Admit: 2019-09-29 | Discharge: 2019-09-29 | Disposition: A | Discharge status: Home / Self Care | Payer: Self-pay | Attending: Emergency Medicine | Admitting: Emergency Medicine

## 2019-09-29 ENCOUNTER — Encounter (HOSPITAL_COMMUNITY): Payer: Self-pay

## 2019-09-29 ENCOUNTER — Other Ambulatory Visit: Payer: Self-pay

## 2019-09-29 DIAGNOSIS — F17210 Nicotine dependence, cigarettes, uncomplicated: Secondary | ICD-10-CM | POA: Insufficient documentation

## 2019-09-29 DIAGNOSIS — R1033 Periumbilical pain: Secondary | ICD-10-CM

## 2019-09-29 LAB — LIPASE, BLOOD: Lipase: 33 U/L (ref 11–51)

## 2019-09-29 LAB — COMPREHENSIVE METABOLIC PANEL
ALT: 16 U/L (ref 0–44)
AST: 13 U/L — ABNORMAL LOW (ref 15–41)
Albumin: 4.3 g/dL (ref 3.5–5.0)
Alkaline Phosphatase: 59 U/L (ref 38–126)
Anion gap: 9 (ref 5–15)
BUN: 13 mg/dL (ref 6–20)
CO2: 25 mmol/L (ref 22–32)
Calcium: 9.3 mg/dL (ref 8.9–10.3)
Chloride: 105 mmol/L (ref 98–111)
Creatinine, Ser: 0.83 mg/dL (ref 0.44–1.00)
GFR calc Af Amer: >60 mL/min (ref >60)
GFR calc non Af Amer: >60 mL/min (ref >60)
Glucose, Bld: 121 mg/dL — ABNORMAL HIGH (ref 70–99)
Potassium: 4.1 mmol/L (ref 3.5–5.1)
Sodium: 139 mmol/L (ref 135–145)
Total Bilirubin: 0.4 mg/dL (ref 0.3–1.2)
Total Protein: 7 g/dL (ref 6.5–8.1)

## 2019-09-29 LAB — CBC
HCT: 43.8 % (ref 36.0–46.0)
Hemoglobin: 14.1 g/dL (ref 12.0–15.0)
MCH: 30.5 pg (ref 26.0–34.0)
MCHC: 32.2 g/dL (ref 30.0–36.0)
MCV: 94.6 fL (ref 80.0–100.0)
Platelets: 270 10*3/uL (ref 150–400)
RBC: 4.63 MIL/uL (ref 3.87–5.11)
RDW: 13.6 % (ref 11.5–15.5)
WBC: 9.4 10*3/uL (ref 4.0–10.5)
nRBC: 0 % (ref 0.0–0.2)

## 2019-09-29 LAB — I-STAT BETA HCG BLOOD, ED (MC, WL, AP ONLY): I-stat hCG, quantitative: <5 m[IU]/mL (ref <5)

## 2019-09-29 MED ORDER — MORPHINE SULFATE (PF) 4 MG/ML IV SOLN
INTRAVENOUS | Status: AC
  Filled 2019-09-29: qty 1

## 2019-09-29 MED ORDER — SODIUM CHLORIDE (PF) 0.9 % IJ SOLN
INTRAMUSCULAR | Status: AC
  Administered 2019-09-30: 01:00:00 10 mL
  Filled 2019-09-29: qty 50

## 2019-09-29 MED ORDER — MORPHINE SULFATE (PF) 4 MG/ML IV SOLN
2.0000 mg | Freq: Once | INTRAVENOUS | Status: AC
  Administered 2019-09-29: 2 mg via INTRAMUSCULAR

## 2019-09-29 NOTE — ED Triage Notes (Signed)
abd pain, seen at [facility] and sent here to R/O hernia.

## 2019-09-29 NOTE — Discharge Instructions (Addendum)
Go immediately to the [facility] emergency room.  I am concerned that you could have an incarcerated hernia at your surgical site, and I think that we need more information to make sure that you are safe to go home.  Because I have given you morphine here, you are not safe to drive.  Let them know if your abdominal pain changes, gets worse, or for any other concerns.

## 2019-09-29 NOTE — ED Provider Notes (Signed)
TIME SEEN: 11:09 PM  CHIEF COMPLAINT: Periumbilical abdominal pain  HPI: Patient is a 34 year old female with history of cholelithiasis status post cholecystectomy 5 years ago, BTL 2 years ago who presents to the emergency department 3 weeks of progressively worsening sharp periumbilical abdominal pain.  Worse with bending over, Valsalva.  No skin changes.  No appreciated masses.  Sent from urgent care to rule out incarcerated hernia.  She denies fevers, nausea, vomiting, diarrhea, dysuria, hematuria, vaginal bleeding or discharge.  ROS: See HPI Constitutional: no fever  Eyes: no drainage  ENT: no runny nose   Cardiovascular:  no chest pain  Resp: no SOB  GI: no vomiting GU: no dysuria Integumentary: no rash  Allergy: no hives  Musculoskeletal: no leg swelling  Neurological: no slurred speech ROS otherwise negative  PAST MEDICAL HISTORY/PAST SURGICAL HISTORY:  Past Medical History:  Diagnosis Date  . Abnormal Pap smear    repeat WNL  . Back pain   . Cholelithiasis   . Depression    no meds since pregancny  . Family history of adverse reaction to anesthesia    " MOTHER TAKES ALONG TIME TO WAKE "  . Gestational diabetes 08/20/2017   gestational - diet controlled  . Hearing loss    as a child - tubes in ear  . Infection    UTI  . Injury of tendon of left rotator cuff   . Obesity   . PONV (postoperative nausea and vomiting)   . Vaginal Pap smear, abnormal     MEDICATIONS:  Prior to Admission medications   Medication Sig Start Date End Date Taking? Authorizing Provider  acetaminophen (TYLENOL) 500 MG tablet Take 1,000 mg by mouth every 6 (six) hours as needed.    [provider]    ALLERGIES:  Allergies  Allergen Reactions  . Cephalexin Itching and Nausea And Vomiting  . Percocet [Oxycodone-Acetaminophen] Nausea And Vomiting    SOCIAL HISTORY:  Social History   Tobacco Use  . Smoking status: Current Every Day Smoker    Packs/day: 0.50    Years: 10.00    Pack years: 5.00    Types: Cigarettes  . Smokeless tobacco: Never Used  Substance Use Topics  . Alcohol use: No    Alcohol/week: 0.0 standard drinks    FAMILY HISTORY: Family History  Problem Relation Age of Onset  . Depression Mother   . Diabetes Mother   . Hypertension Mother   . COPD Mother   . Anesthesia problems Mother        problems waking and nausea and vomitting  . Depression Sister   . Diabetes Sister   . Kidney disease Sister   . Hypertension Sister   . Hypertension Father   . COPD Father   . Anesthesia problems Father        N&V    EXAM: BP 128/83 (BP Location: Right Arm)   Pulse 72   Temp 98.5 F (36.9 C) (Oral)   Resp 16   Ht 5\' 1"  (1.549 m)   LMP 09/20/2019   SpO2 100%   BMI 30.80 kg/m  CONSTITUTIONAL: Alert and oriented and responds appropriately to questions. Well-appearing; well-nourished HEAD: Normocephalic EYES: Conjunctivae clear, pupils appear equal, EOM appear intact ENT: normal nose; moist mucous membranes NECK: Supple, normal ROM CARD: RRR; S1 and S2 appreciated; no murmurs, no clicks, no rubs, no gallops RESP: Normal chest excursion without splinting or tachypnea; breath sounds clear and equal bilaterally; no wheezes, no rhonchi, no rales, no hypoxia  or respiratory distress, speaking full sentences ABD/GI: Normal bowel sounds; non-distended; soft, tender to palpation over the umbilicus without redness, warmth or other skin changes, no mass or hernia appreciated, no rebound, no guarding, no peritoneal signs, no hepatosplenomegaly BACK:  The back appears normal EXT: Normal ROM in all joints; no deformity noted, no edema; no cyanosis SKIN: Normal color for age and race; warm; no rash on exposed skin NEURO: Moves all extremities equally PSYCH: The patient's mood and manner are appropriate.   MEDICAL DECISION MAKING: Patient here with periumbilical abdominal pain.  Sent from urgent care to rule out incarcerated hernia.  No hernia appreciated  currently on exam.  She is quite tender in this area.  No tenderness at McBurney's point.  Atypical for appendicitis given symptoms ongoing for 3 weeks.  Labs here have been reviewed/interpreted and are unremarkable.  No leukocytosis, normal LFTs and lipase.  Normal creatinine.  Pregnancy test negative.  Urinalysis pending.  Discussed risk and benefits of radiation exposure.  Patient would like to proceed with CT imaging of her abdomen here.  She declines further pain medication.  She received 2 mg of morphine at urgent care prior to arrival.  ED PROGRESS: Patient resting comfortably after another dose of morphine here in the emergency department.  Urinalysis reviewed/interpreted and shows no sign of infection, dehydration or blood.  CT of the abdomen pelvis and reviewed/interpreted and shows no acute intra-abdominal or pelvic pathology including no hernia.  She does have some mild intra and extrahepatic biliary dilatation likely due to her cholecystectomy.  Her LFTs here are normal and she has no right upper quadrant tenderness.  Patient has no PCP follow-up.  Recommended PCP and GI follow-up if symptoms continue.  Recommend over-the-counter medications for pain control.  At this time, I do not feel there is any life-threatening condition present. I have reviewed, interpreted and discussed all results (EKG, imaging, lab, urine as appropriate) and exam findings with patient/family. I have reviewed nursing notes and appropriate previous records.  I feel the patient is safe to be discharged home without further emergent workup and can continue workup as an outpatient as needed. Discussed usual and customary return precautions. Patient/family verbalize understanding and are comfortable with this plan.  Outpatient follow-up has been provided as needed. All questions have been answered.    BRIE EPPARD was evaluated in Emergency Department on 09/29/2019 for the symptoms described in the history of present  illness. She was evaluated in the context of the global COVID-19 pandemic, which necessitated consideration that the patient might be at risk for infection with the SARS-CoV-2 virus that causes COVID-19. Institutional protocols and algorithms that pertain to the evaluation of patients at risk for COVID-19 are in a state of rapid change based on information released by regulatory bodies including the CDC and federal and state organizations. These policies and algorithms were followed during the patient's care in the ED.      Lucero Auzenne, Layla Maw, DO 09/30/19 0206

## 2019-09-29 NOTE — ED Notes (Signed)
Patient desired to go to [facility]. Consulted with Dr. Chaney Malling, who states it was fine as long as she was not driving. Patient confirmed her father would drive her.

## 2019-09-29 NOTE — ED Provider Notes (Signed)
HPI  SUBJECTIVE:  Maureen Perez is a 34 y.o. female who presents with 3 weeks of nonmigratory nonradiating periumbilical pain described as sharp.  It was intermittent but has become constant over the past week.  She states that she has had symptoms like this before postoperatively at her umbilical incision site.  She states that the pain is getting worse.  No nausea vomiting fevers abdominal distention.  No urinary complaints.  No chest pain shortness of breath palpitations.  No vaginal complaints.  Normal bowel movement today.  She states symptoms are better with lying down flat.  She has also tried warm soaks.  Symptoms are worse with bending over, lifting, Valsalva.  It is not associated with walking.  She states that the car ride over here was painful.  She is status post cholecystectomy and bilateral tubal ligation.  She is a smoker, has a history of GERD, gestational diabetes.  No history of pancreatitis.  She denies any alcohol or NSAID use.  No history of atrial fibrillation coronary disease hypercholesterolemia mesenteric ischemia Crohn's ulcerative colitis.  LMP: Sunday.  PMD: None.   Past Medical History:  Diagnosis Date  . Abnormal Pap smear    repeat WNL  . Back pain   . Cholelithiasis   . Depression    no meds since pregancny  . Family history of adverse reaction to anesthesia    " MOTHER TAKES ALONG TIME TO WAKE "  . Gestational diabetes 08/20/2017   gestational - diet controlled  . Hearing loss    as a child - tubes in ear  . Infection    UTI  . Injury of tendon of left rotator cuff   . Obesity   . PONV (postoperative nausea and vomiting)   . Vaginal Pap smear, abnormal     Past Surgical History:  Procedure Laterality Date  . CHOLECYSTECTOMY N/A 12/05/2014   Procedure: LAPAROSCOPIC CHOLECYSTECTOMY WITH INTRAOPERATIVE CHOLANGIOGRAM;  Surgeon: Chevis Pretty III, MD;  Location: [facility] OR;  Service: General;  Laterality: N/A;  . TONSILLECTOMY     age 6  . TUBAL LIGATION  Bilateral 10/23/2017   Procedure: POST PARTUM TUBAL LIGATION;  Surgeon: Adam Phenix, MD;  Location: [facility];  Service: Gynecology;  Laterality: Bilateral;  . tubes in ears  age 89    Family History  Problem Relation Age of Onset  . Depression Mother   . Diabetes Mother   . Hypertension Mother   . COPD Mother   . Anesthesia problems Mother        problems waking and nausea and vomitting  . Depression Sister   . Diabetes Sister   . Kidney disease Sister   . Hypertension Sister   . Hypertension Father   . COPD Father   . Anesthesia problems Father        N&V    Social History   Tobacco Use  . Smoking status: Current Every Day Smoker    Packs/day: 0.50    Years: 10.00    Pack years: 5.00    Types: Cigarettes  . Smokeless tobacco: Never Used  Substance Use Topics  . Alcohol use: No    Alcohol/week: 0.0 standard drinks  . Drug use: No    Comment: not any more     Current Facility-Administered Medications:  .  morphine 4 MG/ML injection 2 mg, 2 mg, Intramuscular, Once, Domenick Gong, MD  Current Outpatient Medications:  .  acetaminophen (TYLENOL) 500 MG tablet, Take 1,000 mg by mouth  every 6 (six) hours as needed., Disp: , Rfl:   Allergies  Allergen Reactions  . Cephalexin Itching and Nausea And Vomiting  . Percocet [Oxycodone-Acetaminophen] Nausea And Vomiting     ROS  As noted in HPI.   Physical Exam  BP 124/71 (BP Location: Right Arm)   Pulse 85   Temp 98.1 F (36.7 C) (Oral)   Resp 18   LMP 09/20/2019   SpO2 99%   Constitutional: Well developed, well nourished, appears moderately uncomfortable Eyes:  EOMI, conjunctiva normal bilaterally HENT: Normocephalic, atraumatic,mucus membranes moist Respiratory: Normal inspiratory effort Cardiovascular: Normal rate GI: nondistended.  Nondistended, soft.  Active bowel sounds.  Positive periumbilical surgical scar.  Diffuse abdominal tenderness maximal at the surgical scar.  No appreciable  hernia with Valsalva although this worsens pain.  negative McBurney.  Positive tap table test.  No guarding, rebound. skin: No rash, skin intact Musculoskeletal: no deformities Neurologic: Alert & oriented x 3, no focal neuro deficits Psychiatric: Speech and behavior appropriate   ED Course   Medications  morphine 4 MG/ML injection 2 mg (has no administration in time range)    No orders of the defined types were placed in this encounter.   No results found for this or any previous visit (from the past 24 hour(s)). No results found.  ED Clinical Impression  1. Periumbilical abdominal pain      ED Assessment/Plan  Concern for small incarcerated incisional umbilical hernia.  She has diffuse abdominal tenderness, particularly around the incision site.  There is no appreciable hernia on exam but that does not necessarily rule out a small hernia. She is exquisitely tender in this area.  No guarding, rebound, however feel that patient needs additional work-up and possible imaging.  Patient is status post cholecystectomy and denies any alcohol use, however pancreatitis in the differential, but is much lower given duration of symptoms.  Doubt ectopic pregnancy, UTI, ACS.  Transferring to the emergency department for further evaluation.  Feel that patient is stable to go by private vehicle.  Giving 2 mg of morphine prior to discharge.  Not giving 4 mg because she is opiate nave.  He agrees with this.  Advised her to be n.p.o. until ER evaluation is complete.  She agrees to go immediately to the [facility], ER.  Discussed rationale for transfer to the emergency department with the patient.  She agrees with plan.  Meds ordered this encounter  Medications  . morphine 4 MG/ML injection 2 mg    *This clinic note was created using L[employer]. Therefore, there may be occasional mistakes despite careful proofreading.   ?    Melynda Ripple, MD 09/29/19 1656

## 2019-09-29 NOTE — ED Triage Notes (Signed)
Pt presents with generalized abdominal pain X 3 weeks.

## 2019-09-30 ENCOUNTER — Emergency Department (HOSPITAL_COMMUNITY): Payer: Self-pay

## 2019-09-30 ENCOUNTER — Encounter (HOSPITAL_COMMUNITY): Payer: Self-pay

## 2019-09-30 LAB — URINALYSIS, ROUTINE W REFLEX MICROSCOPIC
Bilirubin Urine: NEGATIVE
Glucose, UA: NEGATIVE mg/dL
Hgb urine dipstick: NEGATIVE
Ketones, ur: NEGATIVE mg/dL
Leukocytes,Ua: NEGATIVE
Nitrite: NEGATIVE
Protein, ur: NEGATIVE mg/dL
Specific Gravity, Urine: 1.02 (ref 1.005–1.030)
pH: 5 (ref 5.0–8.0)

## 2019-09-30 MED ORDER — IOHEXOL 300 MG/ML  SOLN
100.0000 mL | Freq: Once | INTRAMUSCULAR | Status: AC | PRN
  Administered 2019-09-30: 100 mL via INTRAVENOUS

## 2019-09-30 MED ORDER — ONDANSETRON HCL 4 MG/2ML IJ SOLN
4.0000 mg | Freq: Once | INTRAMUSCULAR | Status: AC
  Administered 2019-09-30: 4 mg via INTRAVENOUS
  Filled 2019-09-30: qty 2

## 2019-09-30 MED ORDER — MORPHINE SULFATE (PF) 4 MG/ML IV SOLN
4.0000 mg | Freq: Once | INTRAVENOUS | Status: AC
  Administered 2019-09-30: 01:00:00 4 mg via INTRAVENOUS
  Filled 2019-09-30: qty 1

## 2019-09-30 NOTE — Discharge Instructions (Addendum)
You may alternate Tylenol 1000 mg every 6 hours as needed for pain and Ibuprofen 800 mg every 8 hours as needed for pain.  Please take Ibuprofen with food.  Do not take more than 4000 mg of Tylenol (acetaminophen) in a 24 hour period.   Your labs, urine and CT scan today were reassuring.  Please follow-up with a PCP and GI specialist if abdominal pain symptoms continue.   Steps to find a Primary Care Provider (PCP):  Call [phone] or [phone] to access "[place] Find a Doctor Service."  2.  You may also go on the [facility] website at [email or web address]  3.  [facility] also frequently accepts new patients.  [facility]  [address] [place] [address] [phone]  4.  There are also multiple [facility], [facility] and [facility] practices throughout [place] that are frequently accepting new patients. You may find a clinic that is close to your home and contact them.  [facility] [email or web address] [phone]  [facility] [email or web address]  [email or web address] [phone]  [facility] [email or web address] [phone]  5.  Local Health Departments also can provide primary care services.  [facility]  [address] [place] [address] [phone]  [facility] Department [address] [place] [address] [phone]  [facility] [address] [place] [address]  [place] [address] [phone]

## 2019-09-30 NOTE — ED Notes (Signed)
Pt was resting, helped her to the bathroom to void.

## 2019-10-05 ENCOUNTER — Emergency Department (HOSPITAL_COMMUNITY)
Admission: EM | Admit: 2019-10-05 | Discharge: 2019-10-05 | Disposition: A | Discharge status: Home / Self Care | Payer: Self-pay | Attending: Emergency Medicine | Admitting: Emergency Medicine

## 2019-10-05 ENCOUNTER — Other Ambulatory Visit: Payer: Self-pay

## 2019-10-05 ENCOUNTER — Encounter (HOSPITAL_COMMUNITY): Payer: Self-pay | Admitting: Emergency Medicine

## 2019-10-05 DIAGNOSIS — R1033 Periumbilical pain: Secondary | ICD-10-CM | POA: Insufficient documentation

## 2019-10-05 DIAGNOSIS — Z9049 Acquired absence of other specified parts of digestive tract: Secondary | ICD-10-CM | POA: Insufficient documentation

## 2019-10-05 DIAGNOSIS — Z79899 Other long term (current) drug therapy: Secondary | ICD-10-CM | POA: Insufficient documentation

## 2019-10-05 DIAGNOSIS — F17210 Nicotine dependence, cigarettes, uncomplicated: Secondary | ICD-10-CM | POA: Insufficient documentation

## 2019-10-05 LAB — COMPREHENSIVE METABOLIC PANEL
ALT: 28 U/L (ref 0–44)
AST: 15 U/L (ref 15–41)
Albumin: 3.9 g/dL (ref 3.5–5.0)
Alkaline Phosphatase: 69 U/L (ref 38–126)
Anion gap: 8 (ref 5–15)
BUN: 9 mg/dL (ref 6–20)
CO2: 25 mmol/L (ref 22–32)
Calcium: 9.2 mg/dL (ref 8.9–10.3)
Chloride: 107 mmol/L (ref 98–111)
Creatinine, Ser: 0.85 mg/dL (ref 0.44–1.00)
GFR calc Af Amer: >60 mL/min (ref >60)
GFR calc non Af Amer: >60 mL/min (ref >60)
Glucose, Bld: 100 mg/dL — ABNORMAL HIGH (ref 70–99)
Potassium: 4.4 mmol/L (ref 3.5–5.1)
Sodium: 140 mmol/L (ref 135–145)
Total Bilirubin: 0.5 mg/dL (ref 0.3–1.2)
Total Protein: 6.5 g/dL (ref 6.5–8.1)

## 2019-10-05 LAB — CBC
HCT: 45.1 % (ref 36.0–46.0)
Hemoglobin: 14.4 g/dL (ref 12.0–15.0)
MCH: 30.1 pg (ref 26.0–34.0)
MCHC: 31.9 g/dL (ref 30.0–36.0)
MCV: 94.2 fL (ref 80.0–100.0)
Platelets: 249 10*3/uL (ref 150–400)
RBC: 4.79 MIL/uL (ref 3.87–5.11)
RDW: 13.7 % (ref 11.5–15.5)
WBC: 8.5 10*3/uL (ref 4.0–10.5)
nRBC: 0 % (ref 0.0–0.2)

## 2019-10-05 LAB — URINALYSIS, ROUTINE W REFLEX MICROSCOPIC
Bilirubin Urine: NEGATIVE
Glucose, UA: NEGATIVE mg/dL
Hgb urine dipstick: NEGATIVE
Ketones, ur: NEGATIVE mg/dL
Nitrite: NEGATIVE
Protein, ur: NEGATIVE mg/dL
Specific Gravity, Urine: 1.006 (ref 1.005–1.030)
pH: 7 (ref 5.0–8.0)

## 2019-10-05 LAB — LIPASE, BLOOD: Lipase: 36 U/L (ref 11–51)

## 2019-10-05 LAB — I-STAT BETA HCG BLOOD, ED (MC, WL, AP ONLY): I-stat hCG, quantitative: <5 m[IU]/mL (ref <5)

## 2019-10-05 MED ORDER — DICYCLOMINE HCL 20 MG PO TABS: 20.0000 mg | ORAL_TABLET | Freq: Two times a day (BID) | ORAL | 0 refills | Status: DC

## 2019-10-05 MED ORDER — SODIUM CHLORIDE 0.9% FLUSH: 3.0000 mL | Freq: Once | INTRAVENOUS | Status: DC

## 2019-10-05 MED ORDER — ONDANSETRON 4 MG PO TBDP: 4.0000 mg | ORAL_TABLET | Freq: Three times a day (TID) | ORAL | 0 refills | Status: DC | PRN

## 2019-10-05 NOTE — ED Triage Notes (Signed)
Pt states she has had sharp pain at her belly button for 4 weeks. Pt seen at [facility] and [facility] for CT. She was told she does not have a hernia and everything looked ok. Denies n/v. Pt states she has pain after eating.

## 2019-10-05 NOTE — ED Provider Notes (Signed)
[facility] EMERGENCY DEPARTMENT Provider Note   CSN: [id number] Arrival date & time: 10/05/19  1117     History Chief Complaint  Patient presents with  . Abdominal Pain    Maureen Perez is a 33 y.o. female.  HPI      Maureen Perez is a 34 y.o. female, with a history of cholecystectomy, presenting to the ED with abdominal pain for the past 4 weeks.  She states she has had abdominal pain for the past several months, however, over the past 4 weeks it has been worse. Pain is periumbilical, sharp, worse after eating, radiating throughout the abdomen. Having regular bowel movements.  Last one was this morning and normal.  Denies fever/chills, abdominal distention, abnormal vaginal bleeding/discharge, dyspareunia, urinary symptoms, back\flank pain, vomiting, diarrhea, hematochezia/melena, dizziness, syncope, or any other complaints.   Past Medical History:  Diagnosis Date  . Abnormal Pap smear    repeat WNL  . Back pain   . Cholelithiasis   . Depression    no meds since pregancny  . Family history of adverse reaction to anesthesia    " MOTHER TAKES ALONG TIME TO WAKE "  . Gestational diabetes 08/20/2017   gestational - diet controlled  . Hearing loss    as a child - tubes in ear  . Infection    UTI  . Injury of tendon of left rotator cuff   . Obesity   . PONV (postoperative nausea and vomiting)   . Vaginal Pap smear, abnormal     Patient Active Problem List   Diagnosis Date Noted  . Grief at loss of child 12/23/2017  . Rampant dental caries 04/10/2017  . GERD (gastroesophageal reflux disease) 06/15/2013  . DEPRESSION, MODERATE, RECURRENT 03/20/2010  . 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 INSOMNIA 01/06/2009  . TOBACCO ABUSE 11/22/2008  . ALLERGIC RHINITIS DUE TO ANIMAL HAIR AND DANDER 11/22/2008  . OBESITY 10/20/2008    Past Surgical History:  Procedure Laterality Date  . CHOLECYSTECTOMY N/A 12/05/2014   Procedure: LAPAROSCOPIC  CHOLECYSTECTOMY WITH INTRAOPERATIVE CHOLANGIOGRAM;  Surgeon: Chevis Pretty III, MD;  Location: [facility] OR;  Service: General;  Laterality: N/A;  . TONSILLECTOMY     age 43  . TUBAL LIGATION Bilateral 10/23/2017   Procedure: POST PARTUM TUBAL LIGATION;  Surgeon: Adam Phenix, MD;  Location: [facility] BIRTHING SUITES;  Service: Gynecology;  Laterality: Bilateral;  . tubes in ears  age 40     OB History    Gravida  5   Para  3   Term  3   Preterm  0   AB  2   Living  3     SAB  2   TAB  0   Ectopic  0   Multiple  0   Live Births  3           Family History  Problem Relation Age of Onset  . Depression Mother   . Diabetes Mother   . Hypertension Mother   . COPD Mother   . Anesthesia problems Mother        problems waking and nausea and vomitting  . Depression Sister   . Diabetes Sister   . Kidney disease Sister   . Hypertension Sister   . Hypertension Father   . COPD Father   . Anesthesia problems Father        N&V    Social History   Tobacco Use  . Smoking status: Current Every Day  Smoker    Packs/day: 0.50    Years: 10.00    Pack years: 5.00    Types: Cigarettes  . Smokeless tobacco: Never Used  Substance Use Topics  . Alcohol use: No    Alcohol/week: 0.0 standard drinks  . Drug use: No    Comment: not any more    Home Medications Prior to Admission medications   Medication Sig Start Date End Date Taking? Authorizing Provider  cetirizine (ZYRTEC) 10 MG tablet Take 10 mg by mouth daily.   Yes [provider]  dicyclomine (BENTYL) 20 MG tablet Take 1 tablet (20 mg total) by mouth 2 (two) times daily. 10/05/19   Roselia Snipe C, PA-C  ondansetron (ZOFRAN ODT) 4 MG disintegrating tablet Take 1 tablet (4 mg total) by mouth every 8 (eight) hours as needed for nausea or vomiting. 10/05/19   Miamarie Moll C, PA-C    Allergies    Cephalexin and Percocet [oxycodone-acetaminophen]  Review of Systems   Review of Systems  Constitutional: Negative for chills,  diaphoresis and fever.  Respiratory: Negative for shortness of breath.   Cardiovascular: Negative for chest pain.  Gastrointestinal: Positive for abdominal pain. Negative for blood in stool, diarrhea, nausea and vomiting.  Genitourinary: Negative for dyspareunia, dysuria, flank pain, frequency, hematuria, vaginal bleeding and vaginal discharge.  Musculoskeletal: Negative for back pain.  Neurological: Negative for dizziness, syncope and weakness.  All other systems reviewed and are negative.   Physical Exam Updated Vital Signs BP 113/76 (BP Location: Right Arm)   Pulse 66   Temp 98.1 F (36.7 C) (Oral)   Resp 18   Ht 5\' 1"  (1.549 m)   Wt 70.8 kg   LMP 09/20/2019 Comment: negative beta HCG 09/29/19  SpO2 99%   BMI 29.48 kg/m   Physical Exam Vitals and nursing note reviewed.  Constitutional:      General: She is not in acute distress.    Appearance: She is well-developed. She is not diaphoretic.  HENT:     Head: Normocephalic and atraumatic.     Mouth/Throat:     Mouth: Mucous membranes are moist.     Pharynx: Oropharynx is clear.  Eyes:     Conjunctiva/sclera: Conjunctivae normal.  Cardiovascular:     Rate and Rhythm: Normal rate and regular rhythm.     Pulses: Normal pulses.          Radial pulses are 2+ on the right side and 2+ on the left side.       Posterior tibial pulses are 2+ on the right side and 2+ on the left side.     Heart sounds: Normal heart sounds.     Comments: Tactile temperature in the extremities appropriate and equal bilaterally. Pulmonary:     Effort: Pulmonary effort is normal. No respiratory distress.     Breath sounds: Normal breath sounds.  Abdominal:     Palpations: Abdomen is soft.     Tenderness: There is generalized abdominal tenderness. There is no right CVA tenderness, left CVA tenderness or guarding.     Comments: No noted hernia.  No distention.  No erythema, rash, swelling, increased warmth, bruising.    Musculoskeletal:     Cervical  back: Neck supple.     Right lower leg: No edema.     Left lower leg: No edema.  Lymphadenopathy:     Cervical: No cervical adenopathy.  Skin:    General: Skin is warm and dry.  Neurological:     Mental Status:  She is alert.  Psychiatric:        Mood and Affect: Mood and affect normal.        Speech: Speech normal.        Behavior: Behavior normal.     ED Results / Procedures / Treatments   Labs (all labs ordered are listed, but only abnormal results are displayed) Labs Reviewed  COMPREHENSIVE METABOLIC PANEL - Abnormal; Notable for the following components:      Result Value   Glucose, Bld 100 (*)    All other components within normal limits  URINALYSIS, ROUTINE W REFLEX MICROSCOPIC - Abnormal; Notable for the following components:   APPearance CLOUDY (*)    Leukocytes,Ua LARGE (*)    Bacteria, UA RARE (*)    All other components within normal limits  LIPASE, BLOOD  CBC  I-STAT BETA HCG BLOOD, ED ([facility], [facility], [facility] ONLY)    EKG None  Radiology No results found.  CT ABDOMEN PELVIS W CONTRAST  Result Date: 09/30/2019 CLINICAL DATA:  Worsening umbilical pain EXAM: CT ABDOMEN AND PELVIS WITH CONTRAST TECHNIQUE: Multidetector CT imaging of the abdomen and pelvis was performed using the standard protocol following bolus administration of intravenous contrast. CONTRAST:  OMNIPAQUE IOHEXOL 300 MG/ML  SOLN COMPARISON:  CT 12/30/2017 FINDINGS: Lower chest: Lung bases demonstrate dependent atelectasis. No consolidation or effusion. Normal heart size. Hepatobiliary: Status post cholecystectomy. Mild intra and extrahepatic biliary dilatation. Pancreas: Unremarkable. No pancreatic ductal dilatation or surrounding inflammatory changes. Spleen: Normal in size without focal abnormality. Adrenals/Urinary Tract: Adrenal glands are unremarkable. Kidneys are normal, without renal calculi, focal lesion, or hydronephrosis. Bladder is unremarkable. Stomach/Bowel: Stomach is within normal limits.  Appendix appears normal. No evidence of bowel wall thickening, distention, or inflammatory changes. Vascular/Lymphatic: No significant vascular findings are present. No enlarged abdominal or pelvic lymph nodes. Reproductive: Uterus is unremarkable. Bilateral tubal ligation clips. 2.9 cm right adnexal cyst. Other: Negative for free air or free fluid. Musculoskeletal: No acute or significant osseous findings. IMPRESSION: 1. No CT evidence for acute intra-abdominal or pelvic abnormality. 2. Status post cholecystectomy. Mild intra and extrahepatic biliary dilatation, probably due to post cholecystectomy change though would correlate with LFTs. Electronically Signed   By: Jasmine Pang [name]   On: 09/30/2019 01:36    Procedures Procedures (including critical care time)  Medications Ordered in ED Medications  sodium chloride flush (NS) 0.9 % injection 3 mL (3 mLs Intravenous Not Given 10/05/19 1850)    ED Course  I have reviewed the triage vital signs and the nursing notes.  Pertinent labs & imaging results that were available during my care of the patient were reviewed by me and considered in my medical decision making (see chart for details).  Clinical Course as of Oct 05 57  Tue Oct 05, 2019  5397 Denies any urinary symptoms.  Urinalysis, Routine w reflex microscopic(!) [SJ]    Clinical Course User Index [SJ] Kileen Lange, Hillard Danker, PA-C   MDM Rules/Calculators/A&P                      Patient presents with persistent and recurrent abdominal pain. Patient is nontoxic appearing, afebrile, not tachycardic, not tachypneic, not hypotensive, maintains excellent SPO2 on room air, and is in no apparent distress.   I have reviewed the patient's chart to obtain more information.   I reviewed and interpreted the patient's labs.  Reviewed information from previous ED visit. CT from that visit overall without acute abnormality. They did  note some biliary ductal dilation.  There were no LFT abnormalities at  that time nor were there any today.  No leukocytosis. I did not note hernia on exam.  There is no hernia noted on CT scan. Patient was given recommendation for GI follow-up after her last visit to the ED.  She has not yet done this.  I reiterated this recommendation.  Return precautions discussed.  Patient voices understanding of these instructions, accepts the plan, and is comfortable with discharge.   Vitals:   10/05/19 1335 10/05/19 1602 10/05/19 2017 10/05/19 2019  BP: 113/72 113/76 115/76   Pulse: 62 66  62  Resp: 16 18    Temp:      TempSrc:      SpO2: 98% 99%  100%  Weight:      Height:        Final Clinical Impression(s) / ED Diagnoses Final diagnoses:  Periumbilical abdominal pain    Rx / DC Orders ED Discharge Orders         Ordered    dicyclomine (BENTYL) 20 MG tablet  2 times daily     10/05/19 1941    ondansetron (ZOFRAN ODT) 4 MG disintegrating tablet  Every 8 hours PRN     10/05/19 1941    Ambulatory referral to Gastroenterology     10/05/19 1946           Layla Maw 10/06/19 0103    Charlesetta Shanks, MD 10/11/19 2228

## 2019-10-05 NOTE — Discharge Instructions (Signed)
  Dicyclomine: Dicyclomine (generic for Bentyl) is what is known as an antispasmodic and is intended to help reduce abdominal discomfort. Nausea/vomiting: Use the ondansetron (generic for Zofran) for nausea or vomiting.  This medication may not prevent all vomiting or nausea, but can help facilitate better hydration. Things that can help with nausea/vomiting also include peppermint/menthol candies, vitamin B12, and ginger.  Follow-up: Follow-up with the gastroenterologist.  Call to make an appointment.  Return: Return with fever, worsening pain, inability to have bowel movements, persistent vomiting, chest pain, or any other major concerns.

## 2019-10-11 ENCOUNTER — Encounter: Payer: Self-pay | Admitting: *Deleted

## 2019-10-15 ENCOUNTER — Encounter: Payer: Self-pay | Admitting: Physician Assistant

## 2019-10-15 ENCOUNTER — Other Ambulatory Visit: Payer: Self-pay

## 2019-10-15 ENCOUNTER — Ambulatory Visit (INDEPENDENT_AMBULATORY_CARE_PROVIDER_SITE_OTHER): Payer: Self-pay | Admitting: Physician Assistant

## 2019-10-15 VITALS — BP 130/80 | HR 68 | Temp 98.2°F | Ht 61.5 in | Wt 160.5 lb

## 2019-10-15 DIAGNOSIS — R1013 Epigastric pain: Secondary | ICD-10-CM

## 2019-10-15 DIAGNOSIS — R142 Eructation: Secondary | ICD-10-CM

## 2019-10-15 DIAGNOSIS — R141 Gas pain: Secondary | ICD-10-CM

## 2019-10-15 NOTE — Progress Notes (Signed)
Attending Physician's Attestation   I have reviewed the chart.   I agree with the Advanced Practitioner's note, impression, and recommendations with any updates as below.  Hopefully she does well.  If not, diagnostic endoscopy be very reasonable.  Corliss Parish, MD [facility] Advanced Endoscopy Office # [phone]

## 2019-10-15 NOTE — Progress Notes (Signed)
Chief Complaint: Epigastric pain  HPI:    Maureen Perez is a 34 year old Caucasian female with a past medical history as listed below including depression, status post cholecystectomy, who was referred to me by Anselm Pancoast, PA-C for a complaint of epigastric pain.      10/05/2019 patient seen in the ED for epigastric pain over the past several months but worse in the past month which was periumbilical, sharp and worse after eating.  CMP was normal.  Urinalysis with leukocytes.  Lipase normal.  CT abdomen pelvis showed no evidence for acute intra-abdominal or pelvic abnormality, status post cholecystectomy, mild intra and extrahepatic biliary dilatation, probably due to postcholecystectomy change with normal LFTs.    Today, the patient presents to clinic and explains that over the past month or so she has been experiencing a sharp pain in her epigastrium like "I am being stabbed".  Tells me that this happens most of the time after eating anything but sometimes happens in the middle day without food.  Once it starts it won't stop and comes off and on throughout the day rated as a 7-8/10.  Associated with an increase in gas and some loose stool.  Denies overt heartburn or reflux, nausea or vomiting.  Also describes some right lower quadrant pain.  She has not tried anything over-the-counter to see if it helps.  Was unable to get the Bentyl as it cost too much from the emergency room.    Denies fever, chills, weight loss, melena or symptoms that awaken her from sleep.  Past Medical History:  Diagnosis Date  . Abnormal Pap smear    repeat WNL  . Back pain   . Cholelithiasis   . Depression    no meds since pregancny  . Family history of adverse reaction to anesthesia    " MOTHER TAKES ALONG TIME TO WAKE "  . Gestational diabetes 08/20/2017   gestational - diet controlled  . Hearing loss    as a child - tubes in ear  . Infection    UTI  . Injury of tendon of left rotator cuff   . Obesity   . PONV  (postoperative nausea and vomiting)   . Vaginal Pap smear, abnormal     Past Surgical History:  Procedure Laterality Date  . CHOLECYSTECTOMY N/A 12/05/2014   Procedure: LAPAROSCOPIC CHOLECYSTECTOMY WITH INTRAOPERATIVE CHOLANGIOGRAM;  Surgeon: Chevis Pretty III, MD;  Location: [facility] OR;  Service: General;  Laterality: N/A;  . TONSILLECTOMY     age 36  . TUBAL LIGATION Bilateral 10/23/2017   Procedure: POST PARTUM TUBAL LIGATION;  Surgeon: Adam Phenix, MD;  Location: [facility] BIRTHING SUITES;  Service: Gynecology;  Laterality: Bilateral;  . tubes in ears  age 65    Current Outpatient Medications  Medication Sig Dispense Refill  . cetirizine (ZYRTEC) 10 MG tablet Take 10 mg by mouth daily.     No current facility-administered medications for this visit.    Allergies as of 10/15/2019 - Review Complete 10/15/2019  Allergen Reaction Noted  . Cephalexin Itching and Nausea And Vomiting 12/30/2010  . Percocet [oxycodone-acetaminophen] Nausea And Vomiting 02/05/2013    Family History  Problem Relation Age of Onset  . Depression Mother   . Diabetes Mother   . Hypertension Mother   . COPD Mother   . Anesthesia problems Mother        problems waking and nausea and vomitting  . Heart disease Mother   . Cervical cancer Mother   .  Depression Sister   . Diabetes Sister   . Kidney disease Sister   . Hypertension Sister   . Hypertension Father   . COPD Father   . Anesthesia problems Father        N&V  . Diabetes Father   . SIDS Son        died at 21 days old  . Heart disease Maternal Grandmother   . Diabetes Maternal Grandfather   . Heart disease Maternal Grandfather   . Hypertension Sister     Social History   Socioeconomic History  . Marital status: Single    Spouse name: Not on file  . Number of children: 3  . Years of education: Not on file  . Highest education level: Not on file  Occupational History  . Not on file  Tobacco Use  . Smoking status: Current Every Day Smoker     Packs/day: 0.50    Years: 10.00    Pack years: 5.00    Types: Cigarettes  . Smokeless tobacco: Never Used  Substance and Sexual Activity  . Alcohol use: No    Alcohol/week: 0.0 standard drinks  . Drug use: No    Comment: not any more  . Sexual activity: Yes    Birth control/protection: None    Comment: last sex 21 September 2017  Other Topics Concern  . Not on file  Social History Narrative  . Not on file   Social Determinants of Health   Financial Resource Strain:   . Difficulty of Paying Living Expenses:   Food Insecurity:   . Worried About Programme researcher, broadcasting/film/video in the Last Year:   . Barista in the Last Year:   Transportation Needs:   . Freight forwarder (Medical):   Marland Kitchen Lack of Transportation (Non-Medical):   Physical Activity:   . Days of Exercise per Week:   . Minutes of Exercise per Session:   Stress:   . Feeling of Stress :   Social Connections:   . Frequency of Communication with Friends and Family:   . Frequency of Social Gatherings with Friends and Family:   . Attends Religious Services:   . Active Member of Clubs or Organizations:   . Attends Banker Meetings:   Marland Kitchen Marital Status:   Intimate Partner Violence:   . Fear of Current or Ex-Partner:   . Emotionally Abused:   Marland Kitchen Physically Abused:   . Sexually Abused:     Review of Systems:    Constitutional: No weight loss, fever or chills Skin: No rash Cardiovascular: No chest pain  Respiratory: No SOB  Gastrointestinal: See HPI and otherwise negative Genitourinary: No dysuria  Neurological: No headache, dizziness or syncope Musculoskeletal: No new muscle or joint pain Hematologic: No bleeding  Psychiatric: No history of depression or anxiety   Physical Exam:  Vital signs: BP 130/80 (BP Location: Left Arm, Patient Position: Sitting, Cuff Size: Normal)   Pulse 68   Temp 98.2 F (36.8 C)   Ht 5' 1.5" (1.562 m) Comment: height measured without shoes  Wt 160 lb 8 oz (72.8 kg)   LMP  10/14/2019 Comment: negative beta HCG 09/29/19  BMI 29.84 kg/m   Constitutional:   Pleasant overweight Caucasian female appears to be in NAD, Well developed, Well nourished, alert and cooperative Head:  Normocephalic and atraumatic. Eyes:   PEERL, EOMI. No icterus. Conjunctiva pink. Ears:  Normal auditory acuity. Neck:  Supple Throat: Oral cavity and pharynx without inflammation, swelling  or lesion.  Respiratory: Respirations even and unlabored. Lungs clear to auscultation bilaterally.   No wheezes, crackles, or rhonchi.  Cardiovascular: Normal S1, S2. No MRG. Regular rate and rhythm. No peripheral edema, cyanosis or pallor.  Gastrointestinal:  Soft, nondistended, marked epigastric ttp with involuntary guarding, moderate RLQ ttp, Normal bowel sounds. No appreciable masses or hepatomegaly. Rectal:  Not performed.  Msk:  Symmetrical without gross deformities. Without edema, no deformity or joint abnormality.  Neurologic:  Alert and  oriented x4;  grossly normal neurologically.  Skin:   Dry and intact without significant lesions or rashes. Psychiatric:  Demonstrates good judgement and reason without abnormal affect or behaviors.  RELEVANT LABS AND IMAGING: CBC    Component Value Date/Time   WBC 8.5 10/05/2019 1132   RBC 4.79 10/05/2019 1132   HGB 14.4 10/05/2019 1132   HGB 11.6 08/12/2017 0841   HCT 45.1 10/05/2019 1132   HCT 35.3 08/12/2017 0841   PLT 249 10/05/2019 1132   PLT 249 08/12/2017 0841   MCV 94.2 10/05/2019 1132   MCV 92 08/12/2017 0841   MCH 30.1 10/05/2019 1132   MCHC 31.9 10/05/2019 1132   RDW 13.7 10/05/2019 1132   RDW 13.8 08/12/2017 0841   LYMPHSABS 2.1 08/29/2017 1132   LYMPHSABS 2.0 04/10/2017 0937   MONOABS 0.3 08/29/2017 1132   EOSABS 0.1 08/29/2017 1132   EOSABS 0.2 04/10/2017 0937   BASOSABS 0.0 08/29/2017 1132   BASOSABS 0.0 04/10/2017 0937    CMP     Component Value Date/Time   NA 140 10/05/2019 1132   K 4.4 10/05/2019 1132   CL 107 10/05/2019  1132   CO2 25 10/05/2019 1132   GLUCOSE 100 (H) 10/05/2019 1132   BUN 9 10/05/2019 1132   CREATININE 0.85 10/05/2019 1132   CREATININE 0.73 09/11/2010 1508   CALCIUM 9.2 10/05/2019 1132   PROT 6.5 10/05/2019 1132   ALBUMIN 3.9 10/05/2019 1132   AST 15 10/05/2019 1132   ALT 28 10/05/2019 1132   ALKPHOS 69 10/05/2019 1132   BILITOT 0.5 10/05/2019 1132   GFRNONAA >60 10/05/2019 1132   GFRAA >60 10/05/2019 1132    Assessment: 1.  Epigastric pain: Started about a month ago, worse after eating, off and on all day long, related to increase in gas and eructations, recent labs and CT in the ED normal; consider gastritis +/- PUD +/- H. pylori 2.  Gas/eructations: Likely related to above  Plan: 1.  Discussed with patient that we will try medications first.  If they do not help then can discuss an EGD. 2.  Reviewed antireflux diet and lifestyle modifications. 3.  Prescribed Pantoprazole 40 mg twice daily, 30-60 minutes before breakfast and dinner #60 with 5 refills. 4.  Discussed with patient that we will follow up in 3 to 4 weeks.  If she is doing well then we can decrease dosage of this medicine. Patient assigned to Dr. Rush Landmark today.  Ellouise Newer, PA-C [facility] 10/15/2019, 8:52 AM  Cc: Lorayne Bender, PA-C

## 2019-10-15 NOTE — Patient Instructions (Addendum)
We have sent the following medications to your pharmacy for you to pick up at your convenience: Pantoprazole 40 mg twice daily  Please follow up with Hyacinth Meeker, PA-C on Friday, 11/05/19 at 10:00 am.  If you are age 32 or older, your body mass index should be between 23-30. Your Body mass index is 29.84 kg/m. If this is out of the aforementioned range listed, please consider follow up with your Primary Care Provider.  If you are age 53 or younger, your body mass index should be between 19-25. Your Body mass index is 29.84 kg/m. If this is out of the aformentioned range listed, please consider follow up with your Primary Care Provider.

## 2019-10-19 ENCOUNTER — Encounter (HOSPITAL_COMMUNITY): Payer: Self-pay

## 2019-10-19 ENCOUNTER — Ambulatory Visit (HOSPITAL_COMMUNITY)
Admission: EM | Admit: 2019-10-19 | Discharge: 2019-10-19 | Disposition: A | Discharge status: Home / Self Care | Payer: Self-pay

## 2019-10-19 ENCOUNTER — Ambulatory Visit (INDEPENDENT_AMBULATORY_CARE_PROVIDER_SITE_OTHER): Payer: Self-pay

## 2019-10-19 ENCOUNTER — Other Ambulatory Visit: Payer: Self-pay

## 2019-10-19 DIAGNOSIS — M25562 Pain in left knee: Secondary | ICD-10-CM

## 2019-10-19 DIAGNOSIS — S8992XA Unspecified injury of left lower leg, initial encounter: Secondary | ICD-10-CM

## 2019-10-19 MED ORDER — PREDNISONE 10 MG PO TABS: 20.0000 mg | ORAL_TABLET | Freq: Every day | ORAL | 0 refills | Status: DC

## 2019-10-19 MED ORDER — IBUPROFEN 800 MG PO TABS: 800.0000 mg | ORAL_TABLET | Freq: Three times a day (TID) | ORAL | 0 refills | Status: DC

## 2019-10-19 NOTE — ED Provider Notes (Signed)
[facility]    CSN: [id number] Arrival date & time: 10/19/19  1610      History   Chief Complaint Chief Complaint  Patient presents with  . Knee Pain    HPI ATLANTIS DELONG is a 34 y.o. female.   Who presented to the urgent care with a complaint of left knee pain that started this morning.  Reports she went to a baseball game yesterday and possibly twisted her knee.  Localized the pain to her left knee.  She describes the pain as constant and achy, rated at 8 on a scale of 1-10.Marland Kitchen  Has not tried any OTC medication.  Her symptoms are made worse with ROM.  She denies similar symptoms in the past.  Denies chills, fever, nausea, vomiting, diarrhea   The history is provided by the patient. No language interpreter was used.  Knee Pain   Past Medical History:  Diagnosis Date  . Abnormal Pap smear    repeat WNL  . Back pain   . Cholelithiasis   . Depression    no meds since pregancny  . Family history of adverse reaction to anesthesia    " MOTHER TAKES ALONG TIME TO WAKE "  . Gestational diabetes 08/20/2017   gestational - diet controlled  . Hearing loss    as a child - tubes in ear  . Infection    UTI  . Injury of tendon of left rotator cuff   . Obesity   . PONV (postoperative nausea and vomiting)   . Vaginal Pap smear, abnormal     Patient Active Problem List   Diagnosis Date Noted  . Grief at loss of child 12/23/2017  . Rampant dental caries 04/10/2017  . GERD (gastroesophageal reflux disease) 06/15/2013  . DEPRESSION, MODERATE, RECURRENT 03/20/2010  . 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 INSOMNIA 01/06/2009  . TOBACCO ABUSE 11/22/2008  . ALLERGIC RHINITIS DUE TO ANIMAL HAIR AND DANDER 11/22/2008  . OBESITY 10/20/2008    Past Surgical History:  Procedure Laterality Date  . CHOLECYSTECTOMY N/A 12/05/2014   Procedure: LAPAROSCOPIC CHOLECYSTECTOMY WITH INTRAOPERATIVE CHOLANGIOGRAM;  Surgeon: Chevis Pretty III, MD;  Location: [facility];  Service: General;   Laterality: N/A;  . TONSILLECTOMY     age 34  . TUBAL LIGATION Bilateral 10/23/2017   Procedure: POST PARTUM TUBAL LIGATION;  Surgeon: Adam Phenix, MD;  Location: [facility] BIRTHING SUIT[facility];  Service: Gynecology;  Laterality: Bilateral;  . tubes in ears  age 46    OB History    Gravida  5   Para  3   Term  3   Preterm  0   AB  2   Living  3     SAB  2   TAB  0   Ectopic  0   Multiple  0   Live Births  3            Home Medications    Prior to Admission medications   Medication Sig Start Date End Date Taking? Authorizing Provider  omeprazole (PRILOSEC) 40 MG capsule Take 40 mg by mouth daily.   Yes [provider]  cetirizine (ZYRTEC) 10 MG tablet Take 10 mg by mouth daily.    [provider]    Family History Family History  Problem Relation Age of Onset  . Depression Mother   . Diabetes Mother   . Hypertension Mother   . COPD Mother   . Anesthesia problems Mother  problems waking and nausea and vomitting  . Heart disease Mother   . Cervical cancer Mother   . Depression Sister   . Diabetes Sister   . Kidney disease Sister   . Hypertension Sister   . Hypertension Father   . COPD Father   . Anesthesia problems Father        N&V  . Diabetes Father   . SIDS Son        died at 40 days old  . Heart disease Maternal Grandmother   . Diabetes Maternal Grandfather   . Heart disease Maternal Grandfather   . Hypertension Sister     Social History Social History   Tobacco Use  . Smoking status: Current Every Day Smoker    Packs/day: 0.50    Years: 10.00    Pack years: 5.00    Types: Cigarettes  . Smokeless tobacco: Never Used  Substance Use Topics  . Alcohol use: No    Alcohol/week: 0.0 standard drinks  . Drug use: No    Comment: not any more     Allergies   Cephalexin and Percocet [oxycodone-acetaminophen]   Review of Systems Review of Systems  Constitutional: Negative.   Respiratory: Negative.   Cardiovascular:  Negative.   Musculoskeletal: Positive for arthralgias.  All other systems reviewed and are negative.    Physical Exam Triage Vital Signs ED Triage Vitals  Enc Vitals Group     BP 10/19/19 1632 (!) 114/97     Pulse Rate 10/19/19 1632 75     Resp 10/19/19 1632 18     Temp 10/19/19 1632 98.3 F (36.8 C)     Temp Source 10/19/19 1632 Oral     SpO2 10/19/19 1632 100 %     Weight 10/19/19 1633 160 lb (72.6 kg)     Height 10/19/19 1633 5' 1.5" (1.562 m)     Head Circumference --      Peak Flow --      Pain Score 10/19/19 1633 8     Pain Loc --      Pain Edu? --      Excl. in GC? --    No data found.  Updated Vital Signs BP (!) 114/97   Pulse 75   Temp 98.3 F (36.8 C) (Oral)   Resp 18   Ht 5' 1.5" (1.562 m)   Wt 160 lb (72.6 kg)   LMP 10/14/2019 Comment: negative beta HCG 09/29/19  SpO2 100%   BMI 29.74 kg/m   Visual Acuity Right Eye Distance:   Left Eye Distance:   Bilateral Distance:    Right Eye Near:   Left Eye Near:    Bilateral Near:     Physical Exam Vitals and nursing note reviewed.  Constitutional:      General: She is not in acute distress.    Appearance: Normal appearance. She is normal weight. She is not ill-appearing, toxic-appearing or diaphoretic.  Cardiovascular:     Rate and Rhythm: Normal rate and regular rhythm.     Pulses: Normal pulses.     Heart sounds: Normal heart sounds. No murmur. No friction rub. No gallop.   Pulmonary:     Effort: Pulmonary effort is normal. No respiratory distress.     Breath sounds: Normal breath sounds. No stridor. No wheezing, rhonchi or rales.  Chest:     Chest wall: No tenderness.  Musculoskeletal:        General: Tenderness present. No swelling.     Right knee:  Normal.     Left knee: No swelling or erythema. Tenderness present.     Comments: Patient is unable to bear weight and ambulate with pain.  No surface trauma or obvious effusion, overlying erythema or warmth.  The left knee is without obvious  asymmetry or deformity when compared to the right knee.  Patient is unable to deep knee bend.  She is able to extend knee.  No pain on calf massage.  Negative anterior drawer test  Neurological:     Mental Status: She is alert and oriented to person, place, and time.     Sensory: No sensory deficit.      UC Treatments / Results  Labs (all labs ordered are listed, but only abnormal results are displayed) Labs Reviewed - No data to display  EKG   Radiology DG Knee Complete 4 Views Left  Result Date: 10/19/2019 CLINICAL DATA:  Twisting injury yesterday, posterior pain, inability to bear weight EXAM: LEFT KNEE - COMPLETE 4+ VIEW COMPARISON:  None. FINDINGS: Frontal, bilateral oblique, lateral views of the left knee demonstrate no fractures. Alignment is anatomic. Joint spaces are well preserved. No effusion. IMPRESSION: 1. Unremarkable left knee. Electronically Signed   By: Randa Ngo [name]   On: 10/19/2019 17:00    Procedures Procedures (including critical care time)  Medications Ordered in UC Medications - No data to display  Initial Impression / Assessment and Plan / UC Course  I have reviewed the triage vital signs and the nursing notes.  Pertinent labs & imaging results that were available during my care of the patient were reviewed by me and considered in my medical decision making (see chart for details).     Patient is stable for discharge.  Left knee x-ray was completed to rule out fracture.  X-ray is negative for bony abnormality including fracture or dislocation.  I have reviewed the x-ray myself and the radiologist interpretation.  I am in agreement with the radiologist interpretation.  Ibuprofen and prednisone were prescribed from pain management.   Final Clinical Impressions(s) / UC Diagnoses   Final diagnoses:  Acute pain of left knee     Discharge Instructions     Your left knee x-ray was negative for acute fracture or dislocation. Ibuprofen prescribed  Prednisone was prescribed Follow RICE instruction that is attached Follow-up with PCP Return return for worsening symptoms.    ED Prescriptions    None     PDMP not reviewed this encounter.   Emerson Monte, FNP 10/19/19 1706

## 2019-10-19 NOTE — ED Triage Notes (Signed)
Pt c/o 8/10 sharp pain in left knee that started this morning when she woke up. Pt states she can't bend her knee. Pt denies injury. Pt limped to exam room. No edema noted.

## 2019-10-19 NOTE — Discharge Instructions (Addendum)
Your left knee x-ray was negative for acute fracture or dislocation. Ibuprofen prescribed Prednisone was prescribed Follow RICE instruction that is attached Follow-up with PCP Return return for worsening symptoms.

## 2019-11-05 ENCOUNTER — Ambulatory Visit (INDEPENDENT_AMBULATORY_CARE_PROVIDER_SITE_OTHER): Payer: Self-pay | Admitting: Physician Assistant

## 2019-11-05 ENCOUNTER — Encounter: Payer: Self-pay | Admitting: Physician Assistant

## 2019-11-05 ENCOUNTER — Other Ambulatory Visit: Payer: Self-pay | Admitting: *Deleted

## 2019-11-05 VITALS — BP 110/64 | HR 72 | Ht 61.5 in | Wt 162.1 lb

## 2019-11-05 DIAGNOSIS — R1013 Epigastric pain: Secondary | ICD-10-CM

## 2019-11-05 MED ORDER — PANTOPRAZOLE SODIUM 40 MG PO TBEC: 40.0000 mg | DELAYED_RELEASE_TABLET | Freq: Two times a day (BID) | ORAL | 3 refills | Status: DC

## 2019-11-05 NOTE — Progress Notes (Signed)
   Patient presented to clinic today for follow-up but tells me she never received her Pantoprazole 40 mg twice daily as recommended after her last visit.  She has had no change in her symptoms of epigastric pain (see last note).  We will no charge today's visit and we are making sure to send in the prescription today.  Told her to please call if she does not get this medicine.  We will set up a follow-up for her in 4 weeks with me.  Hyacinth Meeker, PA-C

## 2019-11-10 ENCOUNTER — Telehealth: Payer: Self-pay | Admitting: Physician Assistant

## 2019-11-10 NOTE — Telephone Encounter (Signed)
Victorino Dike- Could we try maybe an omeprazole for her? I could try goodrx for her but unsure that pantoprazole twice daily would be cost effective even with that

## 2019-11-10 NOTE — Telephone Encounter (Signed)
Pt states that medication that was prescribed is over $72. She is requesting to be prescribed something more affordable. Pt did not know the name of med.

## 2019-11-11 MED ORDER — OMEPRAZOLE 40 MG PO CPDR: 40.0000 mg | DELAYED_RELEASE_CAPSULE | Freq: Two times a day (BID) | ORAL | 2 refills | Status: DC

## 2019-11-11 NOTE — Telephone Encounter (Signed)
Sounds great, thanks!  JLL

## 2019-11-11 NOTE — Telephone Encounter (Signed)
Patient called to follow up on request below.

## 2019-11-11 NOTE — Telephone Encounter (Signed)
I have spoken to patient to advise that we can send omeprazole twice daily to her pharmacy in place of pantoprazole to see if it would be more cost effective for her. However, we wont know until we send this to pharmacy and they run through her insurance company. Patient indicates she has been trying to call because she is supposed to have Medicaid but they have not gotten back to her yet. She is advised that we will send new script for omeprazole which will hopefully help with gerd symptom relief and lower her out of pocket cost. She will call us back for any further issues.

## 2019-11-19 ENCOUNTER — Telehealth: Payer: Self-pay | Admitting: Physician Assistant

## 2019-11-19 NOTE — Telephone Encounter (Signed)
The pt has concerns about urination and states she has lost control of her bladder and not sure why.  I advised that she would need to speak with her PCP about that issue.  Pt agreed

## 2019-12-06 ENCOUNTER — Encounter: Payer: Self-pay | Admitting: Physician Assistant

## 2019-12-06 ENCOUNTER — Ambulatory Visit (INDEPENDENT_AMBULATORY_CARE_PROVIDER_SITE_OTHER): Payer: Self-pay | Admitting: Physician Assistant

## 2019-12-06 VITALS — BP 120/84 | HR 73 | Ht 61.5 in | Wt 166.5 lb

## 2019-12-06 DIAGNOSIS — Z01818 Encounter for other preprocedural examination: Secondary | ICD-10-CM

## 2019-12-06 DIAGNOSIS — R1013 Epigastric pain: Secondary | ICD-10-CM

## 2019-12-06 DIAGNOSIS — R142 Eructation: Secondary | ICD-10-CM

## 2019-12-06 NOTE — Addendum Note (Signed)
Addended by: Arville Care on: 12/06/2019 10:59 AM   Modules accepted: Orders

## 2019-12-06 NOTE — Progress Notes (Signed)
Chief Complaint: Follow-up epigastric pain  HPI:    Maureen Perez is a 34 year old Caucasian female with a past medical history as listed below including depression, status post cholecystectomy, who returns to clinic today for follow-up of her epigastric pain.    10/15/2019 patient was seen in clinic for me and recently been in the ED for epigastric pain with a normal CT and labs.  At that time she described pain in her epigastrium like "I am being stabbed".  This happened most the time after eating anything but also in the middle day without food.  Described a 7-8/10 associated increase in gas and loose stool.  At that time prescribed pantoprazole 40 twice daily and discussed that if she is no better in 3 to 4 weeks we could talk about an EGD.    11/05/2019 patient did come back to clinic for follow-up but had never started her medicine, she was no charge for this appointment and made another follow-up and told expressly to get her medicine.    Today, the patient tells me that she has been using Pantoprazole 40 twice daily and does not feel like it has really helped at all over the past 3 weeks.  She had another episode of terrible cramping after eating dinner 2 days ago.  Tells me that all she had was "chicken and dumplings, green beans and seasonal fruit".  Describes that this then leads to her feeling like she has to go to the bathroom frequently and "it is not even always diarrhea".    Denies fever, chills, blood in her stool or symptoms that awaken her from sleep.  Past Medical History:  Diagnosis Date  . Abnormal Pap smear    repeat WNL  . Back pain   . Cholelithiasis   . Depression    no meds since pregancny  . Family history of adverse reaction to anesthesia    " MOTHER TAKES ALONG TIME TO WAKE "  . Gestational diabetes 08/20/2017   gestational - diet controlled  . Hearing loss    as a child - tubes in ear  . Infection    UTI  . Injury of tendon of left rotator cuff   . Obesity   .  PONV (postoperative nausea and vomiting)   . Vaginal Pap smear, abnormal     Past Surgical History:  Procedure Laterality Date  . CHOLECYSTECTOMY N/A 12/05/2014   Procedure: LAPAROSCOPIC CHOLECYSTECTOMY WITH INTRAOPERATIVE CHOLANGIOGRAM;  Surgeon: Chevis Pretty III, MD;  Location[facility];  Service: General;  Laterality: N/A;  . TONSILLECTOMY     age 18  . TUBAL LIGATION Bilateral 10/23/2017   Procedure: POST PARTUM TUBAL LIGATION;  Surgeon: Adam Phenix, MD;  Location: [facility] SU[facility];  Service: Gynecology;  Laterality: Bilateral;  . tubes in ears  age 44    Current Outpatient Medications  Medication Sig Dispense Refill  . cetirizine (ZYRTEC) 10 MG tablet Take 10 mg by mouth daily.    Marland Kitchen ibuprofen (ADVIL) 800 MG tablet Take 1 tablet (800 mg total) by mouth 3 (three) times daily. Take with food (Patient not taking: Reported on 11/05/2019) 30 tablet 0  . omeprazole (PRILOSEC) 40 MG capsule Take 1 capsule (40 mg total) by mouth in the morning and at bedtime. Pharmacy-d/c script for pantoprazole. Not cost effective for patient 60 capsule 2  . pantoprazole (PROTONIX) 40 MG tablet Take 1 tablet (40 mg total) by mouth 2 (two) times daily before a meal. 60 tablet 3  No current facility-administered medications for this visit.    Allergies as of 12/06/2019 - Review Complete 11/05/2019  Allergen Reaction Noted  . Cephalexin Itching and Nausea And Vomiting 12/30/2010  . Percocet [oxycodone-acetaminophen] Nausea And Vomiting 02/05/2013    Family History  Problem Relation Age of Onset  . Depression Mother   . Diabetes Mother   . Hypertension Mother   . COPD Mother   . Anesthesia problems Mother        problems waking and nausea and vomitting  . Heart disease Mother   . Cervical cancer Mother   . Depression Sister   . Diabetes Sister   . Kidney disease Sister   . Hypertension Sister   . Hypertension Father   . COPD Father   . Anesthesia problems Father        N&V  . Diabetes Father     . SIDS Son        died at 36 days old  . Heart disease Maternal Grandmother   . Diabetes Maternal Grandfather   . Heart disease Maternal Grandfather   . Hypertension Sister     Social History   Socioeconomic History  . Marital status: Single    Spouse name: Not on file  . Number of children: 3  . Years of education: Not on file  . Highest education level: Not on file  Occupational History  . Not on file  Tobacco Use  . Smoking status: Current Every Day Smoker    Packs/day: 0.50    Years: 10.00    Pack years: 5.00    Types: Cigarettes  . Smokeless tobacco: Never Used  Vaping Use  . Vaping Use: Never used  Substance and Sexual Activity  . Alcohol use: No    Alcohol/week: 0.0 standard drinks  . Drug use: No    Comment: not any more  . Sexual activity: Yes    Birth control/protection: None    Comment: last sex 21 September 2017  Other Topics Concern  . Not on file  Social History Narrative  . Not on file   Social Determinants of Health   Financial Resource Strain:   . Difficulty of Paying Living Expenses:   Food Insecurity:   . Worried About Charity fundraiser in the Last Year:   . Arboriculturist in the Last Year:   Transportation Needs:   . Film/video editor (Medical):   Marland Kitchen Lack of Transportation (Non-Medical):   Physical Activity:   . Days of Exercise per Week:   . Minutes of Exercise per Session:   Stress:   . Feeling of Stress :   Social Connections:   . Frequency of Communication with Friends and Family:   . Frequency of Social Gatherings with Friends and Family:   . Attends Religious Services:   . Active Member of Clubs or Organizations:   . Attends Archivist Meetings:   Marland Kitchen Marital Status:   Intimate Partner Violence:   . Fear of Current or Ex-Partner:   . Emotionally Abused:   Marland Kitchen Physically Abused:   . Sexually Abused:     Review of Systems:    Constitutional: No weight loss, fever or chills Cardiovascular: No chest  pain Respiratory: No SOB Gastrointestinal: See HPI and otherwise negative   Physical Exam:  Vital signs: BP 120/84   Pulse 73   Ht 5' 1.5" (1.562 m)   Wt 166 lb 8 oz (75.5 kg)   BMI 30.95 kg/m  Constitutional:   Pleasant Caucasian female appears to be in NAD, Well developed, Well nourished, alert and cooperative Respiratory: Respirations even and unlabored. Lungs clear to auscultation bilaterally.   No wheezes, crackles, or rhonchi.  Cardiovascular: Normal S1, S2. No MRG. Regular rate and rhythm. No peripheral edema, cyanosis or pallor.  Gastrointestinal:  Soft, nondistended, moderate epigastric ttp. No rebound or guarding. Normal bowel sounds. No appreciable masses or hepatomegaly. Rectal:  Not performed.  Psychiatric: Demonstrates good judgement and reason without abnormal affect or behaviors.  RELEVANT LABS AND IMAGING: CBC    Component Value Date/Time   WBC 8.5 10/05/2019 1132   RBC 4.79 10/05/2019 1132   HGB 14.4 10/05/2019 1132   HGB 11.6 08/12/2017 0841   HCT 45.1 10/05/2019 1132   HCT 35.3 08/12/2017 0841   PLT 249 10/05/2019 1132   PLT 249 08/12/2017 0841   MCV 94.2 10/05/2019 1132   MCV 92 08/12/2017 0841   MCH 30.1 10/05/2019 1132   MCHC 31.9 10/05/2019 1132   RDW 13.7 10/05/2019 1132   RDW 13.8 08/12/2017 0841   LYMPHSABS 2.1 08/29/2017 1132   LYMPHSABS 2.0 04/10/2017 0937   MONOABS 0.3 08/29/2017 1132   EOSABS 0.1 08/29/2017 1132   EOSABS 0.2 04/10/2017 0937   BASOSABS 0.0 08/29/2017 1132   BASOSABS 0.0 04/10/2017 0937    CMP     Component Value Date/Time   NA 140 10/05/2019 1132   K 4.4 10/05/2019 1132   CL 107 10/05/2019 1132   CO2 25 10/05/2019 1132   GLUCOSE 100 (H) 10/05/2019 1132   BUN 9 10/05/2019 1132   CREATININE 0.85 10/05/2019 1132   CREATININE 0.73 09/11/2010 1508   CALCIUM 9.2 10/05/2019 1132   PROT 6.5 10/05/2019 1132   ALBUMIN 3.9 10/05/2019 1132   AST 15 10/05/2019 1132   ALT 28 10/05/2019 1132   ALKPHOS 69 10/05/2019 1132    BILITOT 0.5 10/05/2019 1132   GFRNONAA >60 10/05/2019 1132   GFRAA >60 10/05/2019 1132    Assessment: 1.  Epigastric pain: Started about 3 months ago, worse after eating, no change with pantoprazole 40 twice daily over the past 3 weeks, CT and labs were normal in the ED; consider gastritis+/-H. Pylori+/-PUD versus functional pain versus other  Plan: 1.  Recommend the patient have an EGD.  She was scheduled with Dr. Meridee Score in the [facility].  Did discuss risks, benefits, limitations and alternatives and the patient agrees to proceed.  The patient has already had both of her Covid vaccines. 2.  Continue Pantoprazole 40 twice a day for now.  We can discontinue if it does not appear she needs it. 3.  Patient to follow in clinic per recommendations from Dr. Meridee Score after time of procedure.  Hyacinth Meeker, PA-C  Gastroenterology 12/06/2019, 9:01 AM

## 2019-12-06 NOTE — Progress Notes (Signed)
Attending Physician's Attestation   I have reviewed the chart.   I agree with the Advanced Practitioner's note, impression, and recommendations with any updates as below.    Elma Limas Mansouraty, MD [facility] Advanced Endoscopy Office # [phone]  

## 2019-12-06 NOTE — Patient Instructions (Addendum)
If you are age 32 or older, your body mass index should be between 23-30. Your Body mass index is 30.95 kg/m. If this is out of the aforementioned range listed, please consider follow up with your Primary Care Provider.  If you are age 45 or younger, your body mass index should be between 19-25. Your Body mass index is 30.95 kg/m. If this is out of the aformentioned range listed, please consider follow up with your Primary Care Provider.   You have been scheduled for an endoscopy. Please follow written instructions given to you at your visit today. If you use inhalers (even only as needed), please bring them with you on the day of your procedure.  Continue Pantoprazole 40 mg twice a day for now.  Follow up pending the results of your Endoscopy.  Due to recent COVID-19 restrictions implemented by [employer] and state authorities and in an effort to keep both patients and staff as safe as possible, [facility] requires COVID-19 testing prior to any scheduled endoscopic procedure. The testing center is located at [address] [place], [place] [address] in the [facility] [employer]  suite.  Your appointment has been scheduled for 12/09/19 at 2:40.   Please bring your insurance cards to this appointment. You will require your COVID screen 2 business days prior to your endoscopic procedure.  You are not required to quarantine after your screening.  You will only receive a phone call with the results if it is POSITIVE.  If you do not receive a call the day before your procedure you should begin your prep, if ordered, and you should report to the endo center for your procedure at your designated appointment arrival time ( one hour prior to the procedure time). There is no cost to you for the screening on the day of the swab.  [facility] Pathology will file with your insurance company for the testing.    You may receive an automated phone call prior  to your procedure or have a message in your MyChart that you have an appointment for a BP/15 at the [facility], please disregard this message.  Your testing will be at the [address] [facility] location.   If you are leaving  Gastroenterology travel [place] on [address]. [address], turn left onto [address], turn night onto [address] at the 1st stop light turn right, pass the [employer] on your right and proceed to [address] (white [address]).

## 2019-12-09 ENCOUNTER — Ambulatory Visit (INDEPENDENT_AMBULATORY_CARE_PROVIDER_SITE_OTHER): Payer: Self-pay

## 2019-12-09 ENCOUNTER — Other Ambulatory Visit: Payer: Self-pay | Admitting: Gastroenterology

## 2019-12-09 DIAGNOSIS — Z1159 Encounter for screening for other viral diseases: Secondary | ICD-10-CM

## 2019-12-10 LAB — SARS CORONAVIRUS 2 (TAT 6-24 HRS): SARS Coronavirus 2: NEGATIVE

## 2019-12-13 ENCOUNTER — Other Ambulatory Visit: Payer: Self-pay

## 2019-12-13 ENCOUNTER — Ambulatory Visit (AMBULATORY_SURGERY_CENTER): Payer: Self-pay | Admitting: Gastroenterology

## 2019-12-13 ENCOUNTER — Encounter: Payer: Self-pay | Admitting: Gastroenterology

## 2019-12-13 VITALS — BP 112/59 | HR 64 | Temp 97.7°F | Resp 14 | Ht 61.0 in | Wt 166.0 lb

## 2019-12-13 DIAGNOSIS — K6389 Other specified diseases of intestine: Secondary | ICD-10-CM

## 2019-12-13 DIAGNOSIS — K2970 Gastritis, unspecified, without bleeding: Secondary | ICD-10-CM

## 2019-12-13 DIAGNOSIS — K319 Disease of stomach and duodenum, unspecified: Secondary | ICD-10-CM

## 2019-12-13 DIAGNOSIS — K3189 Other diseases of stomach and duodenum: Secondary | ICD-10-CM

## 2019-12-13 DIAGNOSIS — R1013 Epigastric pain: Secondary | ICD-10-CM

## 2019-12-13 MED ORDER — SODIUM CHLORIDE 0.9 % IV SOLN: 500.0000 mL | Freq: Once | INTRAVENOUS | Status: DC

## 2019-12-13 MED ORDER — PANTOPRAZOLE SODIUM 40 MG PO TBEC: 40.0000 mg | DELAYED_RELEASE_TABLET | Freq: Two times a day (BID) | ORAL | 6 refills | Status: DC

## 2019-12-13 NOTE — Progress Notes (Signed)
robinol antisialoguge 

## 2019-12-13 NOTE — Progress Notes (Signed)
A and O x3. Report to RN. Tolerated MAC anesthesia well.Teeth unchanged after procedure.

## 2019-12-13 NOTE — Op Note (Signed)
[facility] Patient Name: Maureen Perez Procedure Date: 12/13/2019 3:45 PM MRN: [id number] Endoscopist: Jackquline Denmark , MD Age: 34 Referring MD:  Date of Birth: [birth year] Gender: Female Account #: [id number] Procedure:                Upper GI endoscopy Indications:              Epigastric abdominal pain with negative CT                            Abdo/pelvis. Medicines:                Monitored Anesthesia Care Procedure:                Pre-Anesthesia Assessment:                           - Prior to the procedure, a History and Physical                            was performed, and patient medications and                            allergies were reviewed. The patient's tolerance of                            previous anesthesia was also reviewed. The risks                            and benefits of the procedure and the sedation                            options and risks were discussed with the patient.                            All questions were answered, and informed consent                            was obtained. Prior Anticoagulants: The patient has                            taken no previous anticoagulant or antiplatelet                            agents. ASA Grade Assessment: II - A patient with                            mild systemic disease. After reviewing the risks                            and benefits, the patient was deemed in                            satisfactory condition to undergo the procedure.  After obtaining informed consent, the endoscope was                            passed under direct vision. Throughout the                            procedure, the patient's blood pressure, pulse, and                            oxygen saturations were monitored continuously. The                            Endoscope was introduced through the mouth, and                            advanced to the second part of duodenum. The upper                             GI endoscopy was accomplished without difficulty.                            The patient tolerated the procedure well. Scope In: Scope Out: Findings:                 The examined esophagus was normal with well-defined                            Z-line at 35 cm. Examined by NBI. Incidental note                            was made of 3 small inlet patches in the proximal                            esophagus.                           One non-bleeding cratered gastric ulcer with no                            stigmata of bleeding was found in the gastric                            antrum. The lesion was 8 mm in largest dimension.                            Biopsies were taken with a cold forceps for                            histology from the edges and from the base.                           The examined duodenum was normal. Biopsies for  histology were taken with a cold forceps for                            evaluation of celiac disease. Complications:            No immediate complications. Estimated Blood Loss:     Estimated blood loss: none. Impression:               -Gastric ulcer (Biopsied). Recommendation:           - Patient has a contact number available for                            emergencies. The signs and symptoms of potential                            delayed complications were discussed with the                            patient. Return to normal activities tomorrow.                            Written discharge instructions were provided to the                            patient.                           - Resume previous diet.                           -She has not been compliant with Protonix. We have                            discussed that. She needs to take Protonix 40 mg                            p.o. twice daily, 60, 6 refills until follow-up                            visit..                           -  Await pathology results.                           - Avoid aspirin, ibuprofen, naproxen, or other                            non-steroidal anti-inflammatory drugs.                           - Stop smoking.                           - FU in 6-8 weeks.                           -  Recommend repeating EGD in 12 weeks to ensure                            ulcer healing.                           - Discussed in detail with the patient and                            patient's father. Lynann Bologna, MD 12/13/2019 4:11:34 PM This report has been signed electronically.

## 2019-12-13 NOTE — Patient Instructions (Signed)
PROTONIX 40 MG TWICE A DAY ( TAKE 30 MINUTES BEFORE 1ST MEAL OF THE DAY AND THEN AGAIN AT NIGHT) - ORDER SENT TO PHARMACY  AVOID ASPIRIN,IBUPROFEN,NAPROXEN OR OTHER NON STEROIDAL ANTI INFLAMMATORY DRUGS  STOP SMOKING  F/U IN GI OFFICE IN 6-8 WEEKS - MAKE THIS APPOINTMENT   RECOMMEND REPEATING UPPER ENDOSCOPY IN 12 WEEKS   AWAIT BIOPSY RESULTS FROM ULCER BIOPSIES    YOU HAD AN ENDOSCOPIC PROCEDURE TODAY AT THE [facility]:   Refer to the procedure report that was given to you for any specific questions about what was found during the examination.  If the procedure report does not answer your questions, please call your gastroenterologist to clarify.  If you requested that your care partner not be given the details of your procedure findings, then the procedure report has been included in a sealed envelope for you to review at your convenience later.  YOU SHOULD EXPECT: Some feelings of bloating in the abdomen. Passage of more gas than usual.  Walking can help get rid of the air that was put into your GI tract during the procedure and reduce the bloating. If you had a lower endoscopy (such as a colonoscopy or flexible sigmoidoscopy) you may notice spotting of blood in your stool or on the toilet paper. If you underwent a bowel prep for your procedure, you may not have a normal bowel movement for a few days.  Please Note:  You might notice some irritation and congestion in your nose or some drainage.  This is from the oxygen used during your procedure.  There is no need for concern and it should clear up in a day or so.  SYMPTOMS TO REPORT IMMEDIATELY:   Following lower endoscopy (colonoscopy or flexible sigmoidoscopy):  Excessive amounts of blood in the stool  Significant tenderness or worsening of abdominal pains  Swelling of the abdomen that is new, acute  Fever of 100F or higher   Following upper endoscopy (EGD)  Vomiting of blood or coffee ground material  New  chest pain or pain under the shoulder blades  Painful or persistently difficult swallowing  New shortness of breath  Fever of 100F or higher  Black, tarry-looking stools  For urgent or emergent issues, a gastroenterologist can be reached at any hour by calling [phone]. Do not use MyChart messaging for urgent concerns.    DIET:  We do recommend a small meal at first, but then you may proceed to your regular diet.  Drink plenty of fluids but you should avoid alcoholic beverages for 24 hours.  ACTIVITY:  You should plan to take it easy for the rest of today and you should NOT DRIVE or use heavy machinery until tomorrow (because of the sedation medicines used during the test).    FOLLOW UP: Our staff will call the number listed on your records 48-72 hours following your procedure to check on you and address any questions or concerns that you may have regarding the information given to you following your procedure. If we do not reach you, we will leave a message.  We will attempt to reach you two times.  During this call, we will ask if you have developed any symptoms of COVID 19. If you develop any symptoms (ie: fever, flu-like symptoms, shortness of breath, cough etc.) before then, please call [phone].  If you test positive for Covid 19 in the 2 weeks post procedure, please call and report this information to [place].  If any biopsies were taken you will be contacted by phone or by letter within the next 1-3 weeks.  Please call us at [phone] if you have not heard about the biopsies in 3 weeks.    SIGNATURES/CONFIDENTIALITY: You and/or your care partner have signed paperwork which will be entered into your electronic medical record.  These signatures attest to the fact that that the information above on your After Visit Summary has been reviewed and is understood.  Full responsibility of the confidentiality of this discharge information lies with you and/or your care-partner.

## 2019-12-13 NOTE — Progress Notes (Signed)
Pt's states no medical or surgical changes since previsit or office visit. 

## 2019-12-15 ENCOUNTER — Telehealth: Payer: Self-pay | Admitting: *Deleted

## 2019-12-15 NOTE — Telephone Encounter (Signed)
°  Follow up Call-  Call back number 12/13/2019  Post procedure Call Back phone  # [phone]  Permission to leave phone message Yes  Some recent data might be hidden     Patient questions:  Do you have a fever, pain , or abdominal swelling? No. Pain Score  0 *  Have you tolerated food without any problems? Yes.    Have you been able to return to your normal activities? Yes.    Do you have any questions about your discharge instructions: Diet   No. Medications  No. Follow up visit  No.  Do you have questions or concerns about your Care? No.  Actions: * If pain score is 4 or above: No action needed, pain <4.  1. Have you developed a fever since your procedure? no  2.   Have you had an respiratory symptoms (SOB or cough) since your procedure? no  3.   Have you tested positive for COVID 19 since your procedure no  4.   Have you had any family members/close contacts diagnosed with the COVID 19 since your procedure?  no   If yes to any of these questions please route to Laverna Peace, RN and Charlett Lango, RN

## 2019-12-30 ENCOUNTER — Other Ambulatory Visit (INDEPENDENT_AMBULATORY_CARE_PROVIDER_SITE_OTHER): Payer: Self-pay

## 2019-12-30 ENCOUNTER — Encounter: Payer: Self-pay | Admitting: Gastroenterology

## 2019-12-30 ENCOUNTER — Other Ambulatory Visit: Payer: Self-pay | Admitting: Gastroenterology

## 2019-12-30 DIAGNOSIS — R1013 Epigastric pain: Secondary | ICD-10-CM

## 2019-12-30 LAB — HIGH SENSITIVITY CRP: CRP, High Sensitivity: 23.6 mg/L — ABNORMAL HIGH (ref 0.000–5.000)

## 2019-12-30 LAB — VITAMIN B12: Vitamin B-12: 159 pg/mL — ABNORMAL LOW (ref 211–911)

## 2019-12-30 LAB — TSH: TSH: 1.07 u[IU]/mL (ref 0.35–4.50)

## 2020-01-03 ENCOUNTER — Telehealth: Payer: Self-pay

## 2020-01-03 ENCOUNTER — Other Ambulatory Visit: Payer: Self-pay | Admitting: Gastroenterology

## 2020-01-03 DIAGNOSIS — E538 Deficiency of other specified B group vitamins: Secondary | ICD-10-CM

## 2020-01-03 LAB — CELIAC PANEL 10
Antigliadin Abs, IgA: 4 units (ref 0–19)
Endomysial IgA: NEGATIVE
Gliadin IgG: 1 units (ref 0–19)
IgA/Immunoglobulin A, Serum: 133 mg/dL (ref 87–352)
Tissue Transglut Ab: 3 U/mL (ref 0–5)
Transglutaminase IgA: 13 U/mL — ABNORMAL HIGH (ref 0–3)

## 2020-01-03 MED ORDER — CYANOCOBALAMIN 1000 MCG/ML IJ SOLN
1000.0000 ug | Freq: Once | INTRAMUSCULAR | Status: AC
  Administered 2020-01-21: 1000 ug via INTRAMUSCULAR

## 2020-01-03 NOTE — Telephone Encounter (Signed)
No answerer when called patient back. No voicemail set up

## 2020-01-03 NOTE — Progress Notes (Unsigned)
cy

## 2020-01-04 NOTE — Telephone Encounter (Signed)
Pt is requesting a call back.  From [place] after 5pm if possible

## 2020-01-04 NOTE — Telephone Encounter (Signed)
If Patient cannot contact the office to discuss her lab results and MD's recommendations for treatment during 8 am - 5 pm business hours, then I will mail her a letter with her results and recommendations, then patient can call the office at her convenience for instructions.

## 2020-01-05 ENCOUNTER — Other Ambulatory Visit: Payer: Self-pay | Admitting: Gastroenterology

## 2020-01-05 MED ORDER — CYANOCOBALAMIN 1000 MCG/ML IJ SOLN
INTRAMUSCULAR | 6 refills | Status: DC
Start: 2020-01-05 — End: 2020-03-06

## 2020-01-06 ENCOUNTER — Other Ambulatory Visit: Payer: Self-pay | Admitting: Gastroenterology

## 2020-01-06 ENCOUNTER — Ambulatory Visit (INDEPENDENT_AMBULATORY_CARE_PROVIDER_SITE_OTHER): Payer: Self-pay | Admitting: Gastroenterology

## 2020-01-06 DIAGNOSIS — E538 Deficiency of other specified B group vitamins: Secondary | ICD-10-CM

## 2020-01-06 DIAGNOSIS — R1013 Epigastric pain: Secondary | ICD-10-CM

## 2020-01-06 MED ORDER — CYANOCOBALAMIN 1000 MCG/ML IJ SOLN
1000.0000 ug | Freq: Once | INTRAMUSCULAR | Status: AC
  Administered 2020-01-06: 1000 ug via INTRAMUSCULAR

## 2020-01-07 LAB — INTRINSIC FACTOR ANTIBODIES: Intrinsic Factor Abs, Serum: 17.7 AU/mL — ABNORMAL HIGH (ref 0.0–1.1)

## 2020-01-07 LAB — ANTI-PARIETAL ANTIBODY: Parietal Cell Ab: 11.7 Units (ref 0.0–20.0)

## 2020-01-21 ENCOUNTER — Ambulatory Visit (INDEPENDENT_AMBULATORY_CARE_PROVIDER_SITE_OTHER): Payer: Self-pay | Admitting: Gastroenterology

## 2020-01-21 DIAGNOSIS — E538 Deficiency of other specified B group vitamins: Secondary | ICD-10-CM

## 2020-01-25 ENCOUNTER — Encounter: Payer: Self-pay | Admitting: Physician Assistant

## 2020-01-25 ENCOUNTER — Ambulatory Visit (INDEPENDENT_AMBULATORY_CARE_PROVIDER_SITE_OTHER): Payer: Self-pay | Admitting: Physician Assistant

## 2020-01-25 VITALS — BP 110/76 | HR 82 | Ht 61.5 in | Wt 165.0 lb

## 2020-01-25 DIAGNOSIS — K900 Celiac disease: Secondary | ICD-10-CM

## 2020-01-25 DIAGNOSIS — K253 Acute gastric ulcer without hemorrhage or perforation: Secondary | ICD-10-CM

## 2020-01-25 DIAGNOSIS — E538 Deficiency of other specified B group vitamins: Secondary | ICD-10-CM

## 2020-01-25 DIAGNOSIS — R1013 Epigastric pain: Secondary | ICD-10-CM

## 2020-01-25 MED ORDER — PANTOPRAZOLE SODIUM 40 MG PO TBEC: 40.0000 mg | DELAYED_RELEASE_TABLET | Freq: Two times a day (BID) | ORAL | 5 refills | Status: DC

## 2020-01-25 NOTE — Progress Notes (Signed)
Chief Complaint: Epigastric pain  HPI:    Maureen Perez is a 34 year old female with a past medical history as listed below, assigned to Dr. Meridee Score, who returns to clinic today for follow-up of her epigastric pain after an EGD and labs.    12/06/2019 patient seen in follow-up in clinic for epigastric pain.  That time described using pantoprazole 40 mg daily and did not feel like it has been helping.  Scheduled for an EGD.  And continued on pantoprazole 40 mg twice daily.    12/13/2019 EGD with Dr. Chales Abrahams with gastric ulcer.  Apparently she been noncompliant with Protonix and is recommended she take this twice daily.  Told she would need a repeat EGD in 12 weeks to ensure ulcer healing.  Pathology showed duodenal mucosa with intraepithelial lymphocytosis and preserved villous architecture and gastric antral mucosa with nonspecific reactive gastropathy and intestinal metaplasia negative for dysplasia.  Celiac studies were ordered as well as TSH, B12 and CRP.    12/30/2019 CRP elevated at 23.6.  B12 low at 159.  IgA TTG was positive at 13.  Antiintrinsic factor antibody and antiparietal cell antibody were ordered.  She was told to follow with PCP in regards to elevated CRP.  She was put on a gluten-free diet trial.  Also given vitamin B12 injections.    01/06/2020 intrinsic factor antibody was elevated at 17.7.  Antiparietal cell antibody was normal.    Today, the patient tells me that she has been trying to abide by a gluten-free diet for "the most part", but tells me it can sometimes get confusing. As long as she is abides by this diet her diarrhea and cramping are actually better.  Continues with some epigastric discomfort at times and is using her Pantoprazole 40 mg twice daily.  She has a lot of questions in regards to celiac disease.  Tells me she still feels like she gets bloated easily.    Denies fever, chills, weight loss, nausea or vomiting.  Past Medical History:  Diagnosis Date  . Abnormal  Pap smear    repeat WNL  . Back pain   . Cholelithiasis   . Depression    no meds since pregancny  . Family history of adverse reaction to anesthesia    " MOTHER TAKES ALONG TIME TO WAKE "  . Gestational diabetes 08/20/2017   gestational - diet controlled  . Hearing loss    as a child - tubes in ear  . Infection    UTI  . Injury of tendon of left rotator cuff   . Obesity   . PONV (postoperative nausea and vomiting)   . Vaginal Pap smear, abnormal     Past Surgical History:  Procedure Laterality Date  . CHOLECYSTECTOMY N/A 12/05/2014   Procedure: LAPAROSCOPIC CHOLECYSTECTOMY WITH INTRAOPERATIVE CHOLANGIOGRAM;  Surgeon: Chevis Pretty III, MD;  Location: [facility] OR;  Service: General;  Laterality: N/A;  . TONSILLECTOMY     age 62  . TUBAL LIGATION Bilateral 10/23/2017   Procedure: POST PARTUM TUBAL LIGATION;  Surgeon: Adam Phenix, MD;  Location: [facility];  Service: Gynecology;  Laterality: Bilateral;  . tubes in ears  age 27    Current Outpatient Medications  Medication Sig Dispense Refill  . cetirizine (ZYRTEC) 10 MG tablet Take 10 mg by mouth daily.    . cyanocobalamin (,VITAMIN B-12,) 1000 MCG/ML injection -Vitamin B12 IM 1 mg every 2 weeks x 2, then 1 mg IM every month thereafter 1 mL 6  .  ibuprofen (ADVIL) 800 MG tablet Take 1 tablet (800 mg total) by mouth 3 (three) times daily. Take with food 30 tablet 0  . omeprazole (PRILOSEC) 40 MG capsule Take 1 capsule (40 mg total) by mouth in the morning and at bedtime. Pharmacy-d/c script for pantoprazole. Not cost effective for patient 60 capsule 2  . pantoprazole (PROTONIX) 40 MG tablet Take 1 tablet (40 mg total) by mouth 2 (two) times daily. 60 tablet 6   No current facility-administered medications for this visit.    Allergies as of 01/25/2020 - Review Complete 01/25/2020  Allergen Reaction Noted  . Cephalexin Itching and Nausea And Vomiting 12/30/2010  . Percocet [oxycodone-acetaminophen] Nausea And Vomiting 02/05/2013      Family History  Problem Relation Age of Onset  . Depression Mother   . Diabetes Mother   . Hypertension Mother   . COPD Mother   . Anesthesia problems Mother        problems waking and nausea and vomitting  . Heart disease Mother   . Cervical cancer Mother   . Depression Sister   . Diabetes Sister   . Kidney disease Sister   . Hypertension Sister   . Hypertension Father   . COPD Father   . Anesthesia problems Father        N&V  . Diabetes Father   . SIDS Son        died at 66 days old  . Heart disease Maternal Grandmother   . Diabetes Maternal Grandfather   . Heart disease Maternal Grandfather   . Hypertension Sister     Social History   Socioeconomic History  . Marital status: Single    Spouse name: Not on file  . Number of children: 3  . Years of education: Not on file  . Highest education level: Not on file  Occupational History  . Not on file  Tobacco Use  . Smoking status: Current Every Day Smoker    Packs/day: 0.50    Years: 10.00    Pack years: 5.00    Types: Cigarettes  . Smokeless tobacco: Never Used  Vaping Use  . Vaping Use: Never used  Substance and Sexual Activity  . Alcohol use: No    Alcohol/week: 0.0 standard drinks  . Drug use: No    Comment: not any more  . Sexual activity: Yes    Birth control/protection: None    Comment: last sex 21 September 2017  Other Topics Concern  . Not on file  Social History Narrative  . Not on file   Social Determinants of Health   Financial Resource Strain:   . Difficulty of Paying Living Expenses:   Food Insecurity:   . Worried About Programme researcher, broadcasting/film/video in the Last Year:   . Barista in the Last Year:   Transportation Needs:   . Freight forwarder (Medical):   Marland Kitchen Lack of Transportation (Non-Medical):   Physical Activity:   . Days of Exercise per Week:   . Minutes of Exercise per Session:   Stress:   . Feeling of Stress :   Social Connections:   . Frequency of Communication with  Friends and Family:   . Frequency of Social Gatherings with Friends and Family:   . Attends Religious Services:   . Active Member of Clubs or Organizations:   . Attends Banker Meetings:   Marland Kitchen Marital Status:   Intimate Partner Violence:   . Fear of Current or Ex-Partner:   .  Emotionally Abused:   Marland Kitchen Physically Abused:   . Sexually Abused:     Review of Systems:    Constitutional: No weight loss, fever or chills Cardiovascular: No chest pain Respiratory: No SOB Gastrointestinal: See HPI and otherwise negative   Physical Exam:  Vital signs: BP 110/76   Pulse 82   Ht 5' 1.5" (1.562 m)   Wt 165 lb (74.8 kg)   BMI 30.67 kg/m   Constitutional:   Pleasant Caucasian female appears to be in NAD, Well developed, Well nourished, alert and cooperative Respiratory: Respirations even and unlabored. Lungs clear to auscultation bilaterally.   No wheezes, crackles, or rhonchi.  Cardiovascular: Normal S1, S2. No MRG. Regular rate and rhythm. No peripheral edema, cyanosis or pallor.  Gastrointestinal:  Soft, nondistended, nontender. No rebound or guarding. Normal bowel sounds. No appreciable masses or hepatomegaly. Rectal:  Not performed.  Psychiatric: Demonstrates good judgement and reason without abnormal affect or behaviors.  RELEVANT LABS AND IMAGING: CBC    Component Value Date/Time   WBC 8.5 10/05/2019 1132   RBC 4.79 10/05/2019 1132   HGB 14.4 10/05/2019 1132   HGB 11.6 08/12/2017 0841   HCT 45.1 10/05/2019 1132   HCT 35.3 08/12/2017 0841   PLT 249 10/05/2019 1132   PLT 249 08/12/2017 0841   MCV 94.2 10/05/2019 1132   MCV 92 08/12/2017 0841   MCH 30.1 10/05/2019 1132   MCHC 31.9 10/05/2019 1132   RDW 13.7 10/05/2019 1132   RDW 13.8 08/12/2017 0841   LYMPHSABS 2.1 08/29/2017 1132   LYMPHSABS 2.0 04/10/2017 0937   MONOABS 0.3 08/29/2017 1132   EOSABS 0.1 08/29/2017 1132   EOSABS 0.2 04/10/2017 0937   BASOSABS 0.0 08/29/2017 1132   BASOSABS 0.0 04/10/2017 0937     CMP     Component Value Date/Time   NA 140 10/05/2019 1132   K 4.4 10/05/2019 1132   CL 107 10/05/2019 1132   CO2 25 10/05/2019 1132   GLUCOSE 100 (H) 10/05/2019 1132   BUN 9 10/05/2019 1132   CREATININE 0.85 10/05/2019 1132   CREATININE 0.73 09/11/2010 1508   CALCIUM 9.2 10/05/2019 1132   PROT 6.5 10/05/2019 1132   ALBUMIN 3.9 10/05/2019 1132   AST 15 10/05/2019 1132   ALT 28 10/05/2019 1132   ALKPHOS 69 10/05/2019 1132   BILITOT 0.5 10/05/2019 1132   GFRNONAA >60 10/05/2019 1132   GFRAA >60 10/05/2019 1132    Assessment: 1.  Celiac disease: Labs suggesting celiac disease, patient symptoms of diarrhea and abdominal cramping are better on a gluten-free diet 2.  Vitamin B12 deficiency: Due to above 3.  Epigastric pain: Gastric ulcer on recent EGD  Plan: 1.  Patient needs repeat EGD 12 weeks out from last one to ensure ulcer healing, this was scheduled in September with Dr. Meridee Score.  Did discuss risks, benefits, limitations and alternatives and patient agrees to proceed. 2.  Continue Pantoprazole 40 mg twice daily, 30-60 minutes before breakfast and dinner.  #60 with five refills. 3.  Discussed a gluten-free diet.  I will send her referral to a dietitian to discuss further. 4.  Per recommendations from Dr. Chales Abrahams recommend she follow-up with her PCP in regards to elevated CRP to rule out other autoimmune disorders.  Also continue to follow with [place] office in regards to B12 injections. 5.  Patient to follow in clinic per recommendations from Dr. Meridee Score after time of procedure.  Hyacinth Meeker, PA-C [facility] 01/25/2020, 1:36 PM

## 2020-01-25 NOTE — Patient Instructions (Addendum)
We have sent the following medications to your pharmacy for you to pick up at your convenience: pantoprazole.   Nutrition and diabetes management will contact you with an appointment date and time after they have reviewed the referral.  Please contact your primary care physician due to your elevated CRP level.  You have been scheduled for an endoscopy. Please follow written instructions given to you at your visit today. If you use inhalers (even only as needed), please bring them with you on the day of your procedure.

## 2020-01-26 NOTE — Progress Notes (Signed)
Attending Physician's Attestation   I have reviewed the chart.   I agree with the Advanced Practitioner's note, impression, and recommendations with any updates as below. Repeat endoscopic evaluation to ensure healing is reasonable as well as a repeat biopsies.  If she is being very good with a gluten-free diet and repeat biopsies of the small bowel can be obtained although diagnosis not completely clear even based on the laboratories and findings since no villous blunting was noted.  She should avoid NSAIDs completely because NSAIDs may also cause the findings on biopsies.   Corliss Parish, MD [facility] Advanced Endoscopy Office # [phone]

## 2020-02-07 ENCOUNTER — Ambulatory Visit (INDEPENDENT_AMBULATORY_CARE_PROVIDER_SITE_OTHER): Payer: Self-pay | Admitting: Gastroenterology

## 2020-02-07 DIAGNOSIS — E538 Deficiency of other specified B group vitamins: Secondary | ICD-10-CM

## 2020-02-07 MED ORDER — CYANOCOBALAMIN 1000 MCG/ML IJ SOLN
1000.0000 ug | INTRAMUSCULAR | Status: DC
  Administered 2020-02-07 – 2020-03-06 (×2): 1000 ug via INTRAMUSCULAR

## 2020-02-07 NOTE — Progress Notes (Signed)
Patient received B-12 injection today

## 2020-02-23 ENCOUNTER — Ambulatory Visit (HOSPITAL_COMMUNITY)
Admission: EM | Admit: 2020-02-23 | Discharge: 2020-02-23 | Disposition: A | Discharge status: Home / Self Care | Payer: HRSA Program | Attending: Family Medicine | Admitting: Family Medicine

## 2020-02-23 ENCOUNTER — Other Ambulatory Visit: Payer: Self-pay

## 2020-02-23 ENCOUNTER — Encounter (HOSPITAL_COMMUNITY): Payer: Self-pay

## 2020-02-23 DIAGNOSIS — Z20822 Contact with and (suspected) exposure to covid-19: Secondary | ICD-10-CM | POA: Insufficient documentation

## 2020-02-23 DIAGNOSIS — J029 Acute pharyngitis, unspecified: Secondary | ICD-10-CM | POA: Insufficient documentation

## 2020-02-23 NOTE — ED Triage Notes (Signed)
Pt c/o "itchy throat" for approx 3 days. Denies actual pain with swallowing, fever, chills, congestion, runny nose, HA, cough, SOB, n/v/d or other c/o. Denies exposure to COVID.

## 2020-02-23 NOTE — ED Provider Notes (Signed)
[facility]    CSN: [id number] Arrival date & time: 02/23/20  1705      History   Chief Complaint Chief Complaint  Patient presents with  . itchy throat    HPI Maureen Perez is a 34 y.o. female.   Itchy scratchy throat the past few days. Does have a hx of allergic rhinitis, taking OTC antihistamines regularly. No known sick contacts. UTD on COVID vaccine.      Past Medical History:  Diagnosis Date  . Abnormal Pap smear    repeat WNL  . Back pain   . Cholelithiasis   . Depression    no meds since pregancny  . Family history of adverse reaction to anesthesia    " MOTHER TAKES ALONG TIME TO WAKE "  . Gestational diabetes 08/20/2017   gestational - diet controlled  . Hearing loss    as a child - tubes in ear  . Infection    UTI  . Injury of tendon of left rotator cuff   . Obesity   . PONV (postoperative nausea and vomiting)   . Vaginal Pap smear, abnormal     Patient Active Problem List   Diagnosis Date Noted  . Grief at loss of child 12/23/2017  . Rampant dental caries 04/10/2017  . GERD (gastroesophageal reflux disease) 06/15/2013  . DEPRESSION, MODERATE, RECURRENT 03/20/2010  . 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 INSOMNIA 01/06/2009  . TOBACCO ABUSE 11/22/2008  . ALLERGIC RHINITIS DUE TO ANIMAL HAIR AND DANDER 11/22/2008  . OBESITY 10/20/2008    Past Surgical History:  Procedure Laterality Date  . CHOLECYSTECTOMY N/A 12/05/2014   Procedure: LAPAROSCOPIC CHOLECYSTECTOMY WITH INTRAOPERATIVE CHOLANGIOGRAM;  Surgeon: Chevis Pretty III, MD;  Location: [facility];  Service: General;  Laterality: N/A;  . TONSILLECTOMY     age 42  . TUBAL LIGATION Bilateral 10/23/2017   Procedure: POST PARTUM TUBAL LIGATION;  Surgeon: Adam Phenix, MD;  Location: [facility];  Service: Gynecology;  Laterality: Bilateral;  . tubes in ears  age 16    OB History    Gravida  5   Para  3   Term  3   Preterm  0   AB  2   Living  3     SAB  2   TAB   0   Ectopic  0   Multiple  0   Live Births  3            Home Medications    Prior to Admission medications   Medication Sig Start Date End Date Taking? Authorizing Provider  cetirizine (ZYRTEC) 10 MG tablet Take 10 mg by mouth daily.   Yes [provider]  pantoprazole (PROTONIX) 40 MG tablet Take 1 tablet (40 mg total) by mouth 2 (two) times daily. 01/25/20  Yes Unk Lightning, PA  cyanocobalamin (,VITAMIN B-12,) 1000 MCG/ML injection -Vitamin B12 IM 1 mg every 2 weeks x 2, then 1 mg IM every month thereafter 01/05/20   Lynann Bologna, MD  ibuprofen (ADVIL) 800 MG tablet Take 1 tablet (800 mg total) by mouth 3 (three) times daily. Take with food 10/19/19   Durward Parcel, FNP    Family History Family History  Problem Relation Age of Onset  . Depression Mother   . Diabetes Mother   . Hypertension Mother   . COPD Mother   . Anesthesia problems Mother        problems waking and nausea and vomitting  .  Heart disease Mother   . Cervical cancer Mother   . Depression Sister   . Diabetes Sister   . Kidney disease Sister   . Hypertension Sister   . Hypertension Father   . COPD Father   . Anesthesia problems Father        N&V  . Diabetes Father   . SIDS Son        died at 45 days old  . Heart disease Maternal Grandmother   . Diabetes Maternal Grandfather   . Heart disease Maternal Grandfather   . Hypertension Sister     Social History Social History   Tobacco Use  . Smoking status: Current Every Day Smoker    Packs/day: 0.50    Years: 10.00    Pack years: 5.00    Types: Cigarettes  . Smokeless tobacco: Never Used  Vaping Use  . Vaping Use: Never used  Substance Use Topics  . Alcohol use: No    Alcohol/week: 0.0 standard drinks  . Drug use: No    Comment: not any more     Allergies   Cephalexin and Percocet [oxycodone-acetaminophen]   Review of Systems Review of Systems PER HPI   Physical Exam Triage Vital Signs ED Triage  Vitals  Enc Vitals Group     BP 02/23/20 1824 135/82     Pulse Rate 02/23/20 1824 82     Resp 02/23/20 1824 18     Temp 02/23/20 1824 98.3 F (36.8 C)     Temp Source 02/23/20 1824 Oral     SpO2 02/23/20 1824 99 %     Weight --      Height --      Head Circumference --      Peak Flow --      Pain Score 02/23/20 1823 0     Pain Loc --      Pain Edu? --      Excl. in GC? --    No data found.  Updated Vital Signs BP 135/82 (BP Location: Right Arm)   Pulse 82   Temp 98.3 F (36.8 C) (Oral)   Resp 18   LMP 02/21/2020   SpO2 99%   Visual Acuity Right Eye Distance:   Left Eye Distance:   Bilateral Distance:    Right Eye Near:   Left Eye Near:    Bilateral Near:     Physical Exam Vitals and nursing note reviewed.  Constitutional:      Appearance: Normal appearance. She is not ill-appearing.  HENT:     Head: Atraumatic.     Nose: Nose normal. No rhinorrhea.     Mouth/Throat:     Mouth: Mucous membranes are moist.     Pharynx: Oropharynx is clear. No posterior oropharyngeal erythema.  Eyes:     Extraocular Movements: Extraocular movements intact.     Conjunctiva/sclera: Conjunctivae normal.  Cardiovascular:     Rate and Rhythm: Normal rate and regular rhythm.     Heart sounds: Normal heart sounds.  Pulmonary:     Effort: Pulmonary effort is normal.     Breath sounds: Normal breath sounds.  Abdominal:     General: Bowel sounds are normal. There is no distension.     Palpations: Abdomen is soft.     Tenderness: There is no abdominal tenderness. There is no right CVA tenderness, left CVA tenderness or guarding.  Musculoskeletal:        General: Normal range of motion.     Cervical  back: Normal range of motion and neck supple.  Skin:    General: Skin is warm and dry.  Neurological:     Mental Status: She is alert and oriented to person, place, and time.  Psychiatric:        Mood and Affect: Mood normal.        Thought Content: Thought content normal.         Judgment: Judgment normal.      UC Treatments / Results  Labs (all labs ordered are listed, but only abnormal results are displayed) Labs Reviewed  SARS CORONAVIRUS 2 (TAT 6-24 HRS)    EKG   Radiology No results found.  Procedures Procedures (including critical care time)  Medications Ordered in UC Medications - No data to display  Initial Impression / Assessment and Plan / UC Course  I have reviewed the triage vital signs and the nursing notes.  Pertinent labs & imaging results that were available during my care of the patient were reviewed by me and considered in my medical decision making (see chart for details).     Will r/o COVID given new sxs, though possibly related to allergy exacerbation. Add flonase in case drainage causing sxs. Isolate until covid results return, discussed OTC care and return precautions.   Final Clinical Impressions(s) / UC Diagnoses   Final diagnoses:  Acute pharyngitis, unspecified etiology   Discharge Instructions   None    ED Prescriptions    None     PDMP not reviewed this encounter.   Particia Nearing, [place] 02/23/20 2014

## 2020-02-24 LAB — SARS CORONAVIRUS 2 (TAT 6-24 HRS): SARS Coronavirus 2: NEGATIVE

## 2020-03-06 ENCOUNTER — Ambulatory Visit (INDEPENDENT_AMBULATORY_CARE_PROVIDER_SITE_OTHER): Payer: Self-pay | Admitting: Gastroenterology

## 2020-03-06 DIAGNOSIS — E538 Deficiency of other specified B group vitamins: Secondary | ICD-10-CM

## 2020-03-10 ENCOUNTER — Ambulatory Visit: Payer: Self-pay | Admitting: Registered"

## 2020-03-14 ENCOUNTER — Other Ambulatory Visit: Payer: Self-pay

## 2020-03-14 ENCOUNTER — Ambulatory Visit (HOSPITAL_COMMUNITY)
Admission: EM | Admit: 2020-03-14 | Discharge: 2020-03-14 | Disposition: A | Discharge status: Home / Self Care | Payer: Self-pay | Attending: Urgent Care | Admitting: Urgent Care

## 2020-03-14 ENCOUNTER — Encounter (HOSPITAL_COMMUNITY): Payer: Self-pay | Admitting: Emergency Medicine

## 2020-03-14 DIAGNOSIS — J329 Chronic sinusitis, unspecified: Secondary | ICD-10-CM

## 2020-03-14 DIAGNOSIS — F17200 Nicotine dependence, unspecified, uncomplicated: Secondary | ICD-10-CM

## 2020-03-14 DIAGNOSIS — R0981 Nasal congestion: Secondary | ICD-10-CM

## 2020-03-14 DIAGNOSIS — R059 Cough, unspecified: Secondary | ICD-10-CM

## 2020-03-14 MED ORDER — PREDNISONE 20 MG PO TABS
ORAL_TABLET | ORAL | 0 refills | Status: DC
Start: 2020-03-14 — End: 2020-08-16

## 2020-03-14 MED ORDER — AMOXICILLIN-POT CLAVULANATE 875-125 MG PO TABS: 1.0000 | ORAL_TABLET | Freq: Two times a day (BID) | ORAL | 0 refills | Status: DC

## 2020-03-14 NOTE — Discharge Instructions (Addendum)
Hold off on using the nasal spray until you are completely better.

## 2020-03-14 NOTE — ED Provider Notes (Signed)
[name]   MRN: [id number] DOB: 01-Aug-1985  Subjective:   Maureen Perez is a 34 y.o. female presenting for 2 to 3-week history of persistent sinus congestion, postnasal drainage, sinus pressure, cough. Has a history of significant allergic rhinitis.  She was recently started on an oral antihistamine, Flonase but has not helped.  Tested negative for COVID-19 on 02/23/2020.  She has an appointment set up soon with an allergist.  Smokes half pack per day.  Is considering quitting.   Current Facility-Administered Medications:    cyanocobalamin ((VITAMIN B-12)) injection 1,000 mcg, 1,000 mcg, Intramuscular, Q30 days, Lynann Bologna, MD, 1,000 mcg at 03/06/20 0900  Current Outpatient Medications:    cetirizine (ZYRTEC) 10 MG tablet, Take 10 mg by mouth daily., Disp: , Rfl:    ibuprofen (ADVIL) 800 MG tablet, Take 1 tablet (800 mg total) by mouth 3 (three) times daily. Take with food, Disp: 30 tablet, Rfl: 0   pantoprazole (PROTONIX) 40 MG tablet, Take 1 tablet (40 mg total) by mouth 2 (two) times daily., Disp: 60 tablet, Rfl: 5   Allergies  Allergen Reactions   Cephalexin Itching and Nausea And Vomiting   Percocet [Oxycodone-Acetaminophen] Nausea And Vomiting    Past Medical History:  Diagnosis Date   Abnormal Pap smear    repeat WNL   Back pain    Cholelithiasis    Depression    no meds since pregancny   Family history of adverse reaction to anesthesia    " MOTHER TAKES ALONG TIME TO WAKE "   Gestational diabetes 08/20/2017   gestational - diet controlled   Hearing loss    as a child - tubes in ear   Infection    UTI   Injury of tendon of left rotator cuff    Obesity    PONV (postoperative nausea and vomiting)    Vaginal Pap smear, abnormal      Past Surgical History:  Procedure Laterality Date   CHOLECYSTECTOMY N/A 12/05/2014   Procedure: LAPAROSCOPIC CHOLECYSTECTOMY WITH INTRAOPERATIVE CHOLANGIOGRAM;  Surgeon: Chevis Pretty III, MD;   Location: [facility];  Service: General;  Laterality: N/A;   TONSILLECTOMY     age 53   TUBAL LIGATION Bilateral 10/23/2017   Procedure: POST PARTUM TUBAL LIGATION;  Surgeon: Adam Phenix, MD;  Location: [facility];  Service: Gynecology;  Laterality: Bilateral;   tubes in ears  age 53    Family History  Problem Relation Age of Onset   Depression Mother    Diabetes Mother    Hypertension Mother    COPD Mother    Anesthesia problems Mother        problems waking and nausea and vomitting   Heart disease Mother    Cervical cancer Mother    Depression Sister    Diabetes Sister    Kidney disease Sister    Hypertension Sister    Hypertension Father    COPD Father    Anesthesia problems Father        N&V   Diabetes Father    SIDS Son        died at 81 days old   Heart disease Maternal Grandmother    Diabetes Maternal Grandfather    Heart disease Maternal Grandfather    Hypertension Sister     Social History   Tobacco Use   Smoking status: Current Every Day Smoker    Packs/day: 0.50    Years: 10.00    Pack years: 5.00  Types: Cigarettes   Smokeless tobacco: Never Used  Vaping Use   Vaping Use: Never used  Substance Use Topics   Alcohol use: No    Alcohol/week: 0.0 standard drinks   Drug use: No    Comment: not any more    ROS   Objective:   Vitals: BP (!) 137/96 (BP Location: Left Arm)    Pulse 83    Temp 98.7 F (37.1 C) (Oral)    Resp 17    LMP 02/21/2020    SpO2 100%   Physical Exam Constitutional:      General: She is not in acute distress.    Appearance: Normal appearance. She is well-developed. She is not ill-appearing, toxic-appearing or diaphoretic.  HENT:     Head: Normocephalic and atraumatic.     Nose: Nose normal.     Mouth/Throat:     Mouth: Mucous membranes are moist.     Pharynx: Oropharynx is clear. No oropharyngeal exudate or posterior oropharyngeal erythema.  Eyes:     General: No scleral icterus.        Right eye: No discharge.        Left eye: No discharge.     Extraocular Movements: Extraocular movements intact.     Conjunctiva/sclera: Conjunctivae normal.     Pupils: Pupils are equal, round, and reactive to light.  Cardiovascular:     Rate and Rhythm: Normal rate and regular rhythm.     Pulses: Normal pulses.     Heart sounds: Normal heart sounds. No murmur heard.  No friction rub. No gallop.   Pulmonary:     Effort: Pulmonary effort is normal. No respiratory distress.     Breath sounds: Normal breath sounds. No stridor. No wheezing, rhonchi or rales.  Skin:    General: Skin is warm and dry.     Findings: No rash.  Neurological:     Mental Status: She is alert and oriented to person, place, and time.  Psychiatric:        Mood and Affect: Mood normal.        Behavior: Behavior normal.        Thought Content: Thought content normal.        Judgment: Judgment normal.     Assessment and Plan :   PDMP not reviewed this encounter.  1. Recurrent sinusitis   2. Sinus congestion   3. Cough   4. Smoker     Start Augmentin, prednisone to address sinusitis secondary to uncontrolled allergic rhinitis. Hold Flonase until sx resolve completely and has completed both prescriptions as above. Maintain appt with allergist. Encouraged patient to continue smoking cessation efforts. Maintain oral antihistamine. Counseled patient on potential for adverse effects with medications prescribed/recommended today, ER and return-to-clinic precautions discussed, patient verbalized understanding.    Wallis Bamberg, PA-C 03/14/20 1425

## 2020-03-14 NOTE — ED Triage Notes (Signed)
Pt presents with sinus pressure. 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 Pt tested negative for COVID 10 days ago.   Currently taken OTC allergy medication but gives no relief.

## 2020-03-15 NOTE — Progress Notes (Signed)
0

## 2020-03-24 ENCOUNTER — Encounter: Payer: Self-pay | Admitting: Gastroenterology

## 2020-03-28 ENCOUNTER — Telehealth: Payer: Self-pay

## 2020-03-28 NOTE — Telephone Encounter (Signed)
She is my patient, but I believe that I went out on Paternity leave at time of this procedure in June. She should be following with me. Thank you. GM

## 2020-03-28 NOTE — Telephone Encounter (Signed)
Dr Meridee Score,  I am just checking charts for Thursday 10/14 and this pt is scheduled with you but I do not see a note confirming a change of care from Dr Chales Abrahams?  Are you taking over her care or just doing this procedure? Thank you! Mason Jim, RN

## 2020-03-30 ENCOUNTER — Other Ambulatory Visit: Payer: Self-pay

## 2020-03-30 ENCOUNTER — Ambulatory Visit (AMBULATORY_SURGERY_CENTER): Payer: Self-pay | Admitting: Gastroenterology

## 2020-03-30 ENCOUNTER — Encounter: Payer: Self-pay | Admitting: Gastroenterology

## 2020-03-30 VITALS — BP 116/65 | HR 65 | Temp 98.2°F | Resp 16 | Ht 61.0 in | Wt 165.0 lb

## 2020-03-30 DIAGNOSIS — K319 Disease of stomach and duodenum, unspecified: Secondary | ICD-10-CM

## 2020-03-30 DIAGNOSIS — K2970 Gastritis, unspecified, without bleeding: Secondary | ICD-10-CM

## 2020-03-30 DIAGNOSIS — K3189 Other diseases of stomach and duodenum: Secondary | ICD-10-CM

## 2020-03-30 DIAGNOSIS — K900 Celiac disease: Secondary | ICD-10-CM

## 2020-03-30 DIAGNOSIS — K31A11 Gastric intestinal metaplasia without dysplasia, involving the antrum: Secondary | ICD-10-CM

## 2020-03-30 DIAGNOSIS — K259 Gastric ulcer, unspecified as acute or chronic, without hemorrhage or perforation: Secondary | ICD-10-CM

## 2020-03-30 MED ORDER — SODIUM CHLORIDE 0.9 % IV SOLN: 500.0000 mL | INTRAVENOUS | Status: DC

## 2020-03-30 MED ORDER — OMEPRAZOLE 40 MG PO CPDR: 40.0000 mg | DELAYED_RELEASE_CAPSULE | Freq: Two times a day (BID) | ORAL | 0 refills | Status: DC

## 2020-03-30 NOTE — Progress Notes (Signed)
V/s NS Pt's states no medical or surgical changes since previsit or office visit. 

## 2020-03-30 NOTE — Progress Notes (Signed)
A/ox3, pleased with MAC, report to RN 

## 2020-03-30 NOTE — Patient Instructions (Addendum)
HANDOUTS PROVIDED ON: Gastritis  The biopsies taken today have been sent for pathology.  The results can take 1-3 weeks to receive.  When your next colonoscopy should occur will be based on the pathology results.    Minimize NSAID use.  Repeat EGD in 4 months to check healing. Follow up in clinic in next 2 months.  You may resume your previous diet and medication schedule. Stop taking Pantoprazole and start new prescriptions of Omeprazole 40mg  twice a day 30 minutes before breakfast and dinner.  Thank you for allowing to care for you today!!!     YOU HAD AN ENDOSCOPIC PROCEDURE TODAY AT THE [facility]:   Refer to the procedure report that was given to you for any specific questions about what was found during the examination.  If the procedure report does not answer your questions, please call your gastroenterologist to clarify.  If you requested that your care partner not be given the details of your procedure findings, then the procedure report has been included in a sealed envelope for you to review at your convenience later.  YOU SHOULD EXPECT: Some feelings of bloating in the abdomen. Passage of more gas than usual.  Walking can help get rid of the air that was put into your GI tract during the procedure and reduce the bloating. If you had a lower endoscopy (such as a colonoscopy or flexible sigmoidoscopy) you may notice spotting of blood in your stool or on the toilet paper. If you underwent a bowel prep for your procedure, you may not have a normal bowel movement for a few days.  Please Note:  You might notice some irritation and congestion in your nose or some drainage.  This is from the oxygen used during your procedure.  There is no need for concern and it should clear up in a day or so.  SYMPTOMS TO REPORT IMMEDIATELY:   Following upper endoscopy (EGD)  Vomiting of blood or coffee ground material  New chest pain or pain under the shoulder blades  Painful or  persistently difficult swallowing  New shortness of breath  Fever of 100F or higher  Black, tarry-looking stools  For urgent or emergent issues, a gastroenterologist can be reached at any hour by calling [phone]. Do not use MyChart messaging for urgent concerns.    DIET:  We do recommend a small meal at first, but then you may proceed to your regular diet.  Drink plenty of fluids but you should avoid alcoholic beverages for 24 hours.  ACTIVITY:  You should plan to take it easy for the rest of today and you should NOT DRIVE or use heavy machinery until tomorrow (because of the sedation medicines used during the test).    FOLLOW UP: Our staff will call the number listed on your records 48-72 hours following your procedure to check on you and address any questions or concerns that you may have regarding the information given to you following your procedure. If we do not reach you, we will leave a message.  We will attempt to reach you two times.  During this call, we will ask if you have developed any symptoms of COVID 19. If you develop any symptoms (ie: fever, flu-like symptoms, shortness of breath, cough etc.) before then, please call [phone].  If you test positive for Covid 19 in the 2 weeks post procedure, please call and report this information to [phone].    If any biopsies were taken you will  be contacted by phone or by letter within the next 1-3 weeks.  Please call us at [phone] if you have not heard about the biopsies in 3 weeks.    SIGNATURES/CONFIDENTIALITY: You and/or your care partner have signed paperwork which will be entered into your electronic medical record.  These signatures attest to the fact that that the information above on your After Visit Summary has been reviewed and is understood.  Full responsibility of the confidentiality of this discharge information lies with you and/or your care-partner.

## 2020-03-30 NOTE — Progress Notes (Signed)
Called to room to assist during endoscopic procedure.  Patient ID and intended procedure confirmed with present staff. Received instructions for my participation in the procedure from the performing physician.  

## 2020-03-30 NOTE — Op Note (Signed)
[place] Patient Name: Maureen Perez Procedure Date: 03/30/2020 10:15 AM MRN: [id number] Endoscopist: Justice Britain , MD Age: 34 Referring MD:  Date of Birth: 1985/12/14 Gender: Female Account #: [id number] Procedure:                Upper GI endoscopy Indications:              Epigastric abdominal pain, Positive celiac                            serologies (currently trying to follow a GFD),                            Intestinal metaplasia, Follow-up of intestinal                            metaplasia Medicines:                Monitored Anesthesia Care Procedure:                Pre-Anesthesia Assessment:                           - Prior to the procedure, a History and Physical                            was performed, and patient medications and                            allergies were reviewed. The patient's tolerance of                            previous anesthesia was also reviewed. The risks                            and benefits of the procedure and the sedation                            options and risks were discussed with the patient.                            All questions were answered, and informed consent                            was obtained. Prior Anticoagulants: The patient has                            taken no previous anticoagulant or antiplatelet                            agents. ASA Grade Assessment: II - A patient with                            mild systemic disease. After reviewing the risks  and benefits, the patient was deemed in                            satisfactory condition to undergo the procedure.                           After obtaining informed consent, the endoscope was                            passed under direct vision. Throughout the                            procedure, the patient's blood pressure, pulse, and                            oxygen saturations were monitored continuously. The                             Endoscope was introduced through the mouth, and                            advanced to the second part of duodenum. The upper                            GI endoscopy was accomplished without difficulty.                            The patient tolerated the procedure. Scope In: Scope Out: Findings:                 Multiple areas of ectopic gastric mucosa were found                            in the proximal esophagus, 15 to 18 cm from the                            incisors.                           No other gross lesions were noted in the entire                            esophagus.                           The Z-line was irregular and was found 35 cm from                            the incisors.                           Two non-bleeding superficial gastric ulcers with a                            clean ulcer base (Forrest Class III) were found in  the gastric antrum. The largest lesion was 6 mm in                            largest dimension. They do not look to have healed                            significantly since last procedure.                           Patchy mild inflammation characterized by erythema                            and friability was found in the entire examined                            stomach.                           No other gross lesions were noted in the entire                            examined stomach. Due to prior biopsies showing                            intestinal metaplasia, decision made for Gastric                            Mapping Biopsies to be performed. Biopsies were                            taken with a cold forceps for histology from the                            antrum. Biopsies were taken with a cold forceps for                            histology from the incisura. Biopsies were taken                            with a cold forceps for histology from the greater                             curve. Biopsies were taken with a cold forceps for                            histology from the lesser curve. Biopsies were                            taken with a cold forceps for histology from the                            cardia/fundus.  No gross lesions were noted in the duodenal bulb,                            in the first portion of the duodenum and in the                            second portion of the duodenum. Mucosa itself did                            not appear scalloped. Biopsies for histology were                            taken with a cold forceps for evaluation of celiac                            disease taken 1 bita at a time. Complications:            No immediate complications. Estimated Blood Loss:     Estimated blood loss was minimal. Impression:               - Ectopic gastric mucosa in the proximal esophagus.                            No other gross lesions in esophagus. Z-line                            irregular, 35 cm from the incisors.                           - Non-bleeding gastric ulcers with a clean ulcer                            base (Forrest Class III) - not significantly                            changed from prior. Gastritis. Gastric Mapping                            biopsies performed due to history of intestinal                            metaplasia.                           - No gross lesions in the duodenal bulb, in the                            first portion of the duodenum and in the second                            portion of the duodenum. Biopsied due to history of  abnormal Celiac serologies (patient is currently                            trying to follow a GFD). Recommendation:           - The patient will be observed post-procedure,                            until all discharge criteria are met.                           - Discharge patient to home.                            - Patient has a contact number available for                            emergencies. The signs and symptoms of potential                            delayed complications were discussed with the                            patient. Return to normal activities tomorrow.                            Written discharge instructions were provided to the                            patient.                           - Resume previous diet.                           - Will discuss with patient about her PPI treatment                            and if she is taking appropriately, as there                            remains evidence of inflammation and gastric                            ulcers. May consider transition of PPI as well as                            addition of Carafate depending on discussion with                            patient.                           - Minimize NSAID use.                           -  Await pathology results.                           - Repeat upper endoscopy in 4 months to check                            healing.                           - Follow up in clinic in next 76-month.                           - The findings and recommendations were discussed                            with the patient. GJustice Britain MD 03/30/2020 10:45:31 AM

## 2020-04-03 ENCOUNTER — Telehealth: Payer: Self-pay

## 2020-04-03 NOTE — Telephone Encounter (Signed)
Covid-19 screening questions   Do you now or have you had a fever in the last 14 days? No.  Do you have any respiratory symptoms of shortness of breath or cough now or in the last 14 days? No.  Do you have any family members or close contacts with diagnosed or suspected Covid-19 in the past 14 days? No.  Have you been tested for Covid-19 and found to be positive? No.       Follow up Call-  Call back number 03/30/2020 12/13/2019  Post procedure Call Back phone  # [phone]  Permission to leave phone message Yes Yes  Some recent data might be hidden     Patient questions:  Do you have a fever, pain , or abdominal swelling? No. Pain Score  0 *  Have you tolerated food without any problems? Yes.    Have you been able to return to your normal activities? Yes.    Do you have any questions about your discharge instructions: Diet   No. Medications  No. Follow up visit  No.  Do you have questions or concerns about your Care? No.  Actions: * If pain score is 4 or above: No action needed, pain <4.

## 2020-04-22 ENCOUNTER — Encounter: Payer: Self-pay | Admitting: Gastroenterology

## 2020-04-24 ENCOUNTER — Other Ambulatory Visit: Payer: Self-pay

## 2020-04-24 DIAGNOSIS — K900 Celiac disease: Secondary | ICD-10-CM

## 2020-05-01 ENCOUNTER — Other Ambulatory Visit: Payer: Self-pay

## 2020-05-01 DIAGNOSIS — K900 Celiac disease: Secondary | ICD-10-CM | POA: Diagnosis not present

## 2020-05-09 ENCOUNTER — Telehealth: Payer: Self-pay | Admitting: Gastroenterology

## 2020-05-09 NOTE — Telephone Encounter (Signed)
The pt has been advised that lab work has not resulted. She is aware we will contact her as soon as available.

## 2020-05-12 LAB — CELIAC HLA RFLX TO ABS
DQ2 (DQA1 0501/0505,DQB1 02XX): POSITIVE
DQ8 (DQA1 03XX, DQB1 0302): NEGATIVE

## 2020-05-12 LAB — CELIAC AB TTG DGP TIGA

## 2020-05-15 ENCOUNTER — Other Ambulatory Visit: Payer: Self-pay

## 2020-05-15 DIAGNOSIS — K900 Celiac disease: Secondary | ICD-10-CM

## 2020-05-15 NOTE — Telephone Encounter (Signed)
Mansouraty, Netty Starring., MD  Loretha Stapler, RN Atticus Wedin, I have reviewed what has returned.  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  This makes her risk of celiac disease approximately 3%. With that being said the reflex laboratories to evaluate the celiac panel looks like they were canceled because it is not clear that they had serum that was received. So I am a bit confused by this.  Can you call the lab to find out why they canceled this when they were trying to reflex it from the positive DQ testing? If they do not have enough serum then we need to have her come back for the celiac antibody panel. She should continue taking her gluten-free diet for now as she had been doing as best she can. Please set her up for follow-up with PA Lemmon or myself in the next 6 to 8 weeks depending on how she is doing. Thanks. GM

## 2020-05-15 NOTE — Telephone Encounter (Signed)
The pt has been advised that we would like her to come in for additional labs and also she was given the follow up appt.  She will come in tomorrow and remain on gluten free.

## 2020-05-15 NOTE — Telephone Encounter (Signed)
Pt is requesting a call back from a nurse regarding her lab results (missed call)

## 2020-05-17 DIAGNOSIS — Z419 Encounter for procedure for purposes other than remedying health state, unspecified: Secondary | ICD-10-CM | POA: Diagnosis not present

## 2020-06-17 DIAGNOSIS — Z419 Encounter for procedure for purposes other than remedying health state, unspecified: Secondary | ICD-10-CM | POA: Diagnosis not present

## 2020-07-10 DIAGNOSIS — S39012A Strain of muscle, fascia and tendon of lower back, initial encounter: Secondary | ICD-10-CM | POA: Diagnosis not present

## 2020-07-10 DIAGNOSIS — Z20822 Contact with and (suspected) exposure to covid-19: Secondary | ICD-10-CM | POA: Diagnosis not present

## 2020-07-12 ENCOUNTER — Ambulatory Visit: Payer: Self-pay | Admitting: Gastroenterology

## 2020-07-12 DIAGNOSIS — J309 Allergic rhinitis, unspecified: Secondary | ICD-10-CM | POA: Diagnosis not present

## 2020-07-18 DIAGNOSIS — Z419 Encounter for procedure for purposes other than remedying health state, unspecified: Secondary | ICD-10-CM | POA: Diagnosis not present

## 2020-07-28 DIAGNOSIS — Z20822 Contact with and (suspected) exposure to covid-19: Secondary | ICD-10-CM | POA: Diagnosis not present

## 2020-08-02 DIAGNOSIS — U071 COVID-19: Secondary | ICD-10-CM | POA: Diagnosis not present

## 2020-08-02 DIAGNOSIS — Z20822 Contact with and (suspected) exposure to covid-19: Secondary | ICD-10-CM | POA: Diagnosis not present

## 2020-08-07 DIAGNOSIS — B349 Viral infection, unspecified: Secondary | ICD-10-CM | POA: Diagnosis not present

## 2020-08-07 DIAGNOSIS — Z1152 Encounter for screening for COVID-19: Secondary | ICD-10-CM | POA: Diagnosis not present

## 2020-08-07 DIAGNOSIS — R6883 Chills (without fever): Secondary | ICD-10-CM | POA: Diagnosis not present

## 2020-08-15 DIAGNOSIS — Z419 Encounter for procedure for purposes other than remedying health state, unspecified: Secondary | ICD-10-CM | POA: Diagnosis not present

## 2020-08-16 ENCOUNTER — Ambulatory Visit: Payer: Self-pay | Admitting: Gastroenterology

## 2020-08-16 ENCOUNTER — Encounter: Payer: Self-pay | Admitting: Gastroenterology

## 2020-08-16 ENCOUNTER — Ambulatory Visit (INDEPENDENT_AMBULATORY_CARE_PROVIDER_SITE_OTHER): Payer: Medicaid Other | Admitting: Gastroenterology

## 2020-08-16 ENCOUNTER — Other Ambulatory Visit (INDEPENDENT_AMBULATORY_CARE_PROVIDER_SITE_OTHER): Payer: Medicaid Other

## 2020-08-16 ENCOUNTER — Other Ambulatory Visit: Payer: Self-pay

## 2020-08-16 VITALS — BP 114/80 | HR 72 | Ht 61.0 in | Wt 168.2 lb

## 2020-08-16 DIAGNOSIS — K2970 Gastritis, unspecified, without bleeding: Secondary | ICD-10-CM

## 2020-08-16 DIAGNOSIS — K900 Celiac disease: Secondary | ICD-10-CM

## 2020-08-16 DIAGNOSIS — E538 Deficiency of other specified B group vitamins: Secondary | ICD-10-CM

## 2020-08-16 DIAGNOSIS — Z8711 Personal history of peptic ulcer disease: Secondary | ICD-10-CM | POA: Diagnosis not present

## 2020-08-16 DIAGNOSIS — K31A0 Gastric intestinal metaplasia, unspecified: Secondary | ICD-10-CM | POA: Diagnosis not present

## 2020-08-16 DIAGNOSIS — R899 Unspecified abnormal finding in specimens from other organs, systems and tissues: Secondary | ICD-10-CM

## 2020-08-16 DIAGNOSIS — K9041 Non-celiac gluten sensitivity: Secondary | ICD-10-CM | POA: Diagnosis not present

## 2020-08-16 LAB — CBC
HCT: 40.5 % (ref 36.0–46.0)
Hemoglobin: 13.9 g/dL (ref 12.0–15.0)
MCHC: 34.4 g/dL (ref 30.0–36.0)
MCV: 87.6 fl (ref 78.0–100.0)
Platelets: 253 10*3/uL (ref 150.0–400.0)
RBC: 4.62 Mil/uL (ref 3.87–5.11)
RDW: 13.6 % (ref 11.5–15.5)
WBC: 9.2 10*3/uL (ref 4.0–10.5)

## 2020-08-16 LAB — HIGH SENSITIVITY CRP: CRP, High Sensitivity: 2.71 mg/L (ref 0.000–5.000)

## 2020-08-16 LAB — VITAMIN B12: Vitamin B-12: 207 pg/mL — ABNORMAL LOW (ref 211–911)

## 2020-08-16 LAB — FOLATE: Folate: 8 ng/mL (ref >5.9)

## 2020-08-16 MED ORDER — OMEPRAZOLE 40 MG PO CPDR: 40.0000 mg | DELAYED_RELEASE_CAPSULE | Freq: Every day | ORAL | 0 refills | Status: DC

## 2020-08-16 NOTE — Patient Instructions (Signed)
Your provider has requested that you go to the basement level for lab work before leaving today. Press "B" on the elevator. The lab is located at the first door on the left as you exit the elevator.  Due to recent changes in healthcare laws, you may see the results of your imaging and laboratory studies on MyChart before your provider has had a chance to review them.  We understand that in some cases there may be results that are confusing or concerning to you. Not all laboratory results come back in the same time frame and the provider may be waiting for multiple results in order to interpret others.  Please give [place] 48 hours in order for your provider to thoroughly review all the results before contacting the office for clarification of your results.   Decrease your Omeprazole to once daily.   You have been scheduled for an endoscopy. Please follow written instructions given to you at your visit today. If you use inhalers (even only as needed), please bring them with you on the day of your procedure.  If you are age 33 or younger, your body mass index should be between 19-25. Your Body mass index is 31.79 kg/m. If this is out of the aformentioned range listed, please consider follow up with your Primary Care Provider.   Thank you for choosing me and [facility].  Dr. Meridee Score

## 2020-08-17 LAB — CELIAC AB TTG DGP TIGA
Antigliadin Abs, IgA: 5 units (ref 0–19)
Gliadin IgG: 1 units (ref 0–19)
IgA/Immunoglobulin A, Serum: 120 mg/dL (ref 87–352)
Tissue Transglut Ab: <2 U/mL (ref 0–5)
Transglutaminase IgA: <2 U/mL (ref 0–3)

## 2020-08-22 ENCOUNTER — Encounter: Payer: Self-pay | Admitting: Gastroenterology

## 2020-08-22 DIAGNOSIS — E538 Deficiency of other specified B group vitamins: Secondary | ICD-10-CM | POA: Insufficient documentation

## 2020-08-22 DIAGNOSIS — K31A0 Gastric intestinal metaplasia, unspecified: Secondary | ICD-10-CM | POA: Insufficient documentation

## 2020-08-22 DIAGNOSIS — K900 Celiac disease: Secondary | ICD-10-CM | POA: Insufficient documentation

## 2020-08-22 DIAGNOSIS — Z8711 Personal history of peptic ulcer disease: Secondary | ICD-10-CM | POA: Insufficient documentation

## 2020-08-22 DIAGNOSIS — R899 Unspecified abnormal finding in specimens from other organs, systems and tissues: Secondary | ICD-10-CM | POA: Insufficient documentation

## 2020-08-22 DIAGNOSIS — K9041 Non-celiac gluten sensitivity: Secondary | ICD-10-CM | POA: Insufficient documentation

## 2020-08-22 NOTE — Progress Notes (Signed)
[place] VISIT   Primary Care Provider Patient, No Pcp Per No address on file None   Patient Profile: Maureen Perez is a 35 y.o. female with a pmh significant for obesity, status post cholecystectomy, B12 deficiency, PUD (manifested as gastritis and GU), GIM, gluten intolerance with abnormal celiac serologies.  The patient presents to the [facility] for an evaluation and management of problem(s) noted below:  Problem List 1. Gluten intolerance   2. Abnormal celiac serologies   3. Possible celiac disease   4. History of gastric ulcer   5. Gastric intestinal metaplasia   6. Vitamin B 12 deficiency     History of Present Illness Please see initial consultation note and progress notes by PA [facility] for full details of HPI.  Interval History The patient returns for a scheduled follow-up.  My last evaluation the patient was the time of her repeat endoscopy.  She has been following a gluten-free diet at the time.  We had done HLA haplotype and found that she had DQ 2 but not DQ 8 positivity increasing the chance of having celiac disease to approximately 3 to 4%.  Repeat serological testing of her TTG was to be performed but was not completed as a result of not having enough serum.  The patient did not return for her repeat set of laboratories since then.  The patient however, is doing extremely well at this time.  She has had continued work on her dentition and is eating better.  She is trying her best to continue to follow a gluten-free diet as it is made a significant impact in her overall symptoms.  She still has occasional episodes of bloating and discomfort.  She is felt well with her current PPI dosing and is ready for follow-up endoscopic evaluation to ensure completion of her healing of her gastric ulcer.  She denies overt symptoms of dyspepsia and changes in bowel habits currently.  GI Review of Systems Positive as above Negative for  pyrosis, dysphagia, nausea, vomiting, pain, change in bowel habits, melena, hematochezia  Review of Systems General: Denies fevers/chills/weight loss unintentionally Cardiovascular: Denies chest pain/palpitations Pulmonary: Denies shortness of breath Gastroenterological: See HPI Genitourinary: Denies darkened urine or hematuria Hematological: Denies easy bruising/bleeding Dermatological: Denies jaundice Psychological: Mood is stable   Medications Current Outpatient Medications  Medication Sig Dispense Refill  . cetirizine (ZYRTEC) 10 MG tablet Take 10 mg by mouth daily.    Marland Kitchen omeprazole (PRILOSEC) 40 MG capsule Take 1 capsule (40 mg total) by mouth daily. Take 30 minutes before breakfast and dinner 42 capsule 0   Current Facility-Administered Medications  Medication Dose Route Frequency Provider Last Rate Last Admin  . cyanocobalamin ((VITAMIN B-12)) injection 1,000 mcg  1,000 mcg Intramuscular Q30 days Jackquline Denmark, MD   1,000 mcg at 03/06/20 0900    Allergies Allergies  Allergen Reactions  . Cephalexin Itching and Nausea And Vomiting  . Percocet [Oxycodone-Acetaminophen] Nausea And Vomiting    Histories Past Medical History:  Diagnosis Date  . Abnormal Pap smear    repeat WNL  . Back pain   . Cholelithiasis   . Depression    no meds since pregancny  . Family history of adverse reaction to anesthesia    " MOTHER TAKES ALONG TIME TO WAKE "  . Gestational diabetes 08/20/2017   gestational - diet controlled  . Hearing loss    as a child - tubes in ear  . Infection    UTI  .  Injury of tendon of left rotator cuff   . Obesity   . PONV (postoperative nausea and vomiting)   . Vaginal Pap smear, abnormal    Past Surgical History:  Procedure Laterality Date  . CHOLECYSTECTOMY N/A 12/05/2014   Procedure: LAPAROSCOPIC CHOLECYSTECTOMY WITH INTRAOPERATIVE CHOLANGIOGRAM;  Surgeon: Autumn Messing III, MD;  Location: [facility];  Service: General;  Laterality: N/A;  . TONSILLECTOMY      age 64  . TUBAL LIGATION Bilateral 10/23/2017   Procedure: POST PARTUM TUBAL LIGATION;  Surgeon: Woodroe Mode, MD;  Location: [place];  Service: Gynecology;  Laterality: Bilateral;  . tubes in ears  age 36   Social History   Socioeconomic History  . Marital status: Single    Spouse name: Not on file  . Number of children: 3  . Years of education: Not on file  . Highest education level: Not on file  Occupational History  . Not on file  Tobacco Use  . Smoking status: Current Every Day Smoker    Packs/day: 0.50    Years: 10.00    Pack years: 5.00    Types: Cigarettes  . Smokeless tobacco: Never Used  Vaping Use  . Vaping Use: Never used  Substance and Sexual Activity  . Alcohol use: No    Alcohol/week: 0.0 standard drinks  . Drug use: No    Comment: not any more  . Sexual activity: Yes    Birth control/protection: None    Comment: last sex 21 September 2017  Other Topics Concern  . Not on file  Social History Narrative  . Not on file   Social Determinants of Health   Financial Resource Strain: Not on file  Food Insecurity: Not on file  Transportation Needs: Not on file  Physical Activity: Not on file  Stress: Not on file  Social Connections: Not on file  Intimate Partner Violence: Not on file   Family History  Problem Relation Age of Onset  . Depression Mother   . Diabetes Mother   . Hypertension Mother   . COPD Mother   . Anesthesia problems Mother        problems waking and nausea and vomitting  . Heart disease Mother   . Cervical cancer Mother   . Depression Sister   . Diabetes Sister   . Kidney disease Sister   . Hypertension Sister   . Hypertension Father   . COPD Father   . Anesthesia problems Father        N&V  . Diabetes Father   . SIDS Son        died at 73 days old  . Heart disease Maternal Grandmother   . Diabetes Maternal Grandfather   . Heart disease Maternal Grandfather   . Hypertension Sister   . Colon cancer Neg Hx   .  Esophageal cancer Neg Hx   . Inflammatory bowel disease Neg Hx   . Liver disease Neg Hx   . Pancreatic cancer Neg Hx   . Rectal cancer Neg Hx   . Stomach cancer Neg Hx    I have reviewed her medical, social, and family history in detail and updated the electronic medical record as necessary.    PHYSICAL EXAMINATION  BP 114/80 (BP Location: Left Arm, Patient Position: Sitting, Cuff Size: Normal)   Pulse 72   Ht '5\' 1"'  (1.549 m)   Wt 168 lb 4 oz (76.3 kg)   BMI 31.79 kg/m  Wt Readings from Last 3 Encounters:  08/16/20 168 lb 4 oz (76.3 kg)  03/30/20 165 lb (74.8 kg)  01/25/20 165 lb (74.8 kg)  GEN: NAD, appears stated age, doesn't appear chronically ill PSYCH: Cooperative, without pressured speech EYE: Conjunctivae pink, sclerae anicteric ENT: Masked CV: Nontachycardic RESP: No audible wheezing GI: NABS, soft, rounded, obese, NT/ND, without rebound or guarding MSK/EXT: No lower extremity edema SKIN: No jaundice NEURO:  Alert & Oriented x 3, no focal deficits   REVIEW OF DATA  I reviewed the following data at the time of this encounter:  GI Procedures and Studies  October 2021 EGD - Ectopic gastric mucosa in the proximal esophagus. No other gross lesions in esophagus. Zline irregular, 35 cm from the incisors. - Non-bleeding gastric ulcers with a clean ulcer base (Forrest Class III) - not significantly changed from prior. Gastritis. Gastric Mapping biopsies performed due to history of intestinal metaplasia. - No gross lesions in the duodenal bulb, in the first portion of the duodenum and in the second portion of the duodenum. Biopsied due to history of abnormal Celiac serologies (patient is currently trying to follow a GFD). Pathology Diagnosis 1. Surgical [P], duodenum - DUODENAL MUCOSA WITH INTRAEPITHELIAL LYMPHOCYTOSIS AND PRESERVED VILLOUS ARCHITECTURE. SEE NOTE 2. Surgical [P], gastric antrum - GASTRIC ANTRAL MUCOSA WITH MILD NONSPECIFIC REACTIVE GASTROPATHY AND FOCAL  INTESTINAL METAPLASIA - NEGATIVE FOR DYSPLASIA - WARTHIN STARRY STAIN IS NEGATIVE FOR HELICOBACTER PYLORI 3. Surgical [P], stomach, incisura - GASTRIC ANTRAL MUCOSA WITH MILD NONSPECIFIC REACTIVE GASTROPATHY - NEGATIVE FOR INTESTINAL METAPLASIA OR DYSPLASIA 4. Surgical [P], greater curvature stomach - GASTRIC OXYNTIC MUCOSA WITH NO SPECIFIC HISTOPATHOLOGIC CHANGES - NEGATIVE FOR INTESTINAL METAPLASIA OR DYSPLASIA 5. Surgical [P], lesser curve stomach - GASTRIC OXYNTIC MUCOSA WITH NO SPECIFIC HISTOPATHOLOGIC CHANGES - NEGATIVE FOR INTESTINAL METAPLASIA OR DYSPLASIA 6. Surgical [P], stomach, cardia and fundus - GASTRIC OXYNTIC MUCOSA WITH NO SPECIFIC HISTOPATHOLOGIC CHANGES - NEGATIVE FOR INTESTINAL METAPLASIA OR DYSPLASIA  Laboratory Studies  Reviewed those in epic  Imaging Studies  April 2021 CT abdomen pelvis with contrast IMPRESSION: 1. No CT evidence for acute intra-abdominal or pelvic abnormality. 2. Status post cholecystectomy. Mild intra and extrahepatic biliary dilatation, probably due to post cholecystectomy change though would correlate with LFTs.   ASSESSMENT  Ms. Coggins is a 35 y.o. female with a pmh significant for obesity, status post cholecystectomy, B12 deficiency, PUD (manifested as gastritis and GU), GIM, gluten intolerance with abnormal celiac serologies.  The patient is seen today for evaluation and management of:  1. Gluten intolerance   2. Abnormal celiac serologies   3. Possible celiac disease   4. History of gastric ulcer   5. Gastric intestinal metaplasia   6. Vitamin B 12 deficiency    The patient is clinically and hemodynamically stable at this time.  She has made significant improvement in many of her symptoms by going gluten-free.  Although her laboratories were not overtly consistent with having true celiac disease her most recent biopsies did not show evidence of that but she was following a gluten-free diet.  We will plan to repeat her celiac  serologies in the setting of her known HLA haplotype pain that increases her chance of actually having celiac disease.  In either case she has at least a gluten intolerance but may have underlying celiac disease.  In the setting of her previous healing gastric ulcer we will plan repeat endoscopy ensure completion of healing.  If we are not dealing with the issues and concerns of potential celiac disease, then her gastric  intestinal metaplasia would be followed up in 3 years but we will see how things look on the repeat endoscopy coming up and decide upon the follow-up endoscopy interval although that will likely be in 3 to 5 years.  If the patient has continued B12 deficiency then increase oral intake as well as parenteral/intramuscular therapy may need to be considered.  We will repeat her CRP which was performed last year and was elevated and see if that remains elevated or not from the nonspecific biomarker perspective.  The risks and benefits of endoscopic evaluation were discussed with the patient; these include but are not limited to the risk of perforation, infection, bleeding, missed lesions, lack of diagnosis, severe illness requiring hospitalization, as well as anesthesia and sedation related illnesses.  The patient is agreeable to proceed.  All patient questions were answered to the best of my ability, and the patient agrees to the aforementioned plan of action with follow-up as indicated.   PLAN  Laboratories as outlined below Decrease omeprazole to 40 mg daily Surveillance endoscopy to ensure healing of gastric ulcer GIM surveillance in 3 to 5 years Continue gluten-free diet FODMAP chart given to patient so she can look at her foods and ensure that she is not in taking high FODMAPs on a regular basis   Orders Placed This Encounter  Procedures  . CBC  . CRP High sensitivity  . B12  . Folate  . Celiac Ab tTG DGP TIgA  . Ambulatory referral to Gastroenterology    New Prescriptions    No medications on file   Modified Medications   Modified Medication Previous Medication   OMEPRAZOLE (PRILOSEC) 40 MG CAPSULE omeprazole (PRILOSEC) 40 MG capsule      Take 1 capsule (40 mg total) by mouth daily. Take 30 minutes before breakfast and dinner    Take 1 capsule (40 mg total) by mouth 2 (two) times daily. Take 30 minutes before breakfast and dinner    Planned Follow Up No follow-ups on file.   Total Time in Face-to-Face and in Coordination of Care for patient including independent/personal interpretation/review of prior testing, medical history, examination, medication adjustment, communicating results with the patient directly, and documentation with the EHR is 30 minutes.   Justice Britain, MD [facility] Advanced Endoscopy Office # [phone]

## 2020-08-30 ENCOUNTER — Telehealth: Payer: Self-pay | Admitting: Gastroenterology

## 2020-08-30 ENCOUNTER — Encounter: Payer: Medicaid Other | Admitting: Gastroenterology

## 2020-08-30 NOTE — Telephone Encounter (Signed)
Called Patient to see if she would be coming for her procedure she stated that she did not have transportation her fathers car broke down.  She will call back to reschedule.

## 2020-08-30 NOTE — Telephone Encounter (Signed)
Sorry to hear about this. Understand. Please place a recall for 3 months from now in case she has not called to reschedule. Thanks. GM

## 2020-09-06 ENCOUNTER — Telehealth: Payer: Self-pay | Admitting: Gastroenterology

## 2020-09-06 DIAGNOSIS — K31A0 Gastric intestinal metaplasia, unspecified: Secondary | ICD-10-CM

## 2020-09-06 MED ORDER — OMEPRAZOLE 40 MG PO CPDR: 40.0000 mg | DELAYED_RELEASE_CAPSULE | Freq: Every day | ORAL | 2 refills | Status: DC

## 2020-09-06 NOTE — Telephone Encounter (Signed)
Refill have been sent.

## 2020-09-15 DIAGNOSIS — Z419 Encounter for procedure for purposes other than remedying health state, unspecified: Secondary | ICD-10-CM | POA: Diagnosis not present

## 2020-10-15 DIAGNOSIS — Z419 Encounter for procedure for purposes other than remedying health state, unspecified: Secondary | ICD-10-CM | POA: Diagnosis not present

## 2020-10-23 DIAGNOSIS — R197 Diarrhea, unspecified: Secondary | ICD-10-CM | POA: Diagnosis not present

## 2020-10-23 DIAGNOSIS — R112 Nausea with vomiting, unspecified: Secondary | ICD-10-CM | POA: Diagnosis not present

## 2020-11-15 DIAGNOSIS — Z419 Encounter for procedure for purposes other than remedying health state, unspecified: Secondary | ICD-10-CM | POA: Diagnosis not present

## 2020-11-16 DIAGNOSIS — R197 Diarrhea, unspecified: Secondary | ICD-10-CM | POA: Diagnosis not present

## 2020-12-04 DIAGNOSIS — N838 Other noninflammatory disorders of ovary, fallopian tube and broad ligament: Secondary | ICD-10-CM | POA: Diagnosis not present

## 2020-12-04 DIAGNOSIS — R1030 Lower abdominal pain, unspecified: Secondary | ICD-10-CM | POA: Diagnosis not present

## 2020-12-04 DIAGNOSIS — N760 Acute vaginitis: Secondary | ICD-10-CM | POA: Diagnosis not present

## 2020-12-04 DIAGNOSIS — N7091 Salpingitis, unspecified: Secondary | ICD-10-CM | POA: Diagnosis not present

## 2020-12-04 DIAGNOSIS — R109 Unspecified abdominal pain: Secondary | ICD-10-CM | POA: Diagnosis not present

## 2020-12-04 DIAGNOSIS — B9689 Other specified bacterial agents as the cause of diseases classified elsewhere: Secondary | ICD-10-CM | POA: Diagnosis not present

## 2020-12-15 DIAGNOSIS — Z419 Encounter for procedure for purposes other than remedying health state, unspecified: Secondary | ICD-10-CM | POA: Diagnosis not present

## 2021-01-15 DIAGNOSIS — Z419 Encounter for procedure for purposes other than remedying health state, unspecified: Secondary | ICD-10-CM | POA: Diagnosis not present

## 2021-01-16 ENCOUNTER — Encounter: Payer: Self-pay | Admitting: Gastroenterology

## 2021-02-15 DIAGNOSIS — Z419 Encounter for procedure for purposes other than remedying health state, unspecified: Secondary | ICD-10-CM | POA: Diagnosis not present

## 2021-03-17 DIAGNOSIS — Z419 Encounter for procedure for purposes other than remedying health state, unspecified: Secondary | ICD-10-CM | POA: Diagnosis not present

## 2021-03-22 ENCOUNTER — Emergency Department (HOSPITAL_COMMUNITY)
Admission: EM | Admit: 2021-03-22 | Discharge: 2021-03-22 | Disposition: A | Discharge status: Home / Self Care | Payer: Medicaid Other | Attending: Emergency Medicine | Admitting: Emergency Medicine

## 2021-03-22 ENCOUNTER — Encounter (HOSPITAL_COMMUNITY): Payer: Self-pay

## 2021-03-22 ENCOUNTER — Other Ambulatory Visit: Payer: Self-pay

## 2021-03-22 DIAGNOSIS — Z79899 Other long term (current) drug therapy: Secondary | ICD-10-CM | POA: Insufficient documentation

## 2021-03-22 DIAGNOSIS — R350 Frequency of micturition: Secondary | ICD-10-CM | POA: Diagnosis not present

## 2021-03-22 DIAGNOSIS — F17210 Nicotine dependence, cigarettes, uncomplicated: Secondary | ICD-10-CM | POA: Diagnosis not present

## 2021-03-22 LAB — URINALYSIS, ROUTINE W REFLEX MICROSCOPIC
Bilirubin Urine: NEGATIVE
Glucose, UA: NEGATIVE mg/dL
Hgb urine dipstick: NEGATIVE
Ketones, ur: NEGATIVE mg/dL
Leukocytes,Ua: NEGATIVE
Nitrite: NEGATIVE
Protein, ur: NEGATIVE mg/dL
Specific Gravity, Urine: 1.012 (ref 1.005–1.030)
pH: 7 (ref 5.0–8.0)

## 2021-03-22 LAB — POC URINE PREG, ED: Preg Test, Ur: NEGATIVE

## 2021-03-22 NOTE — ED Provider Notes (Signed)
Emergency Medicine Provider Triage Evaluation Note  Maureen Perez , a 35 y.o. female  was evaluated in triage.  Pt complains of positive pregnancy test despite tubal ligation in 2019.  Denies bleeding, discharge, cramping, abdominal pain.  Decided to take pregnancy test due to urinary frequency  Review of Systems  Positive:  Negative:   Physical Exam  BP 126/86 (BP Location: Left Arm)   Pulse 79   Temp 98.8 F (37.1 C) (Oral)   Resp 14   Ht 5\' 1"  (1.549 m)   Wt 74.4 kg   LMP 02/24/2021   SpO2 100%   BMI 30.99 kg/m  Gen:   Awake, no distress   Resp:  Normal effort  MSK:   Moves extremities without difficulty  Other:  Small amount of right lower quadrant discomfort  Medical Decision Making  Medically screening exam initiated at 9:15 AM.  Appropriate orders placed.  Maureen Perez was informed that the remainder of the evaluation will be completed by another provider, this initial triage assessment does not replace that evaluation, and the importance of remaining in the ED until their evaluation is complete.     Carlyle Lipa 03/22/21 05/22/21    [id number], MD 03/26/21 [id number]

## 2021-03-22 NOTE — Discharge Instructions (Addendum)
Your pregnancy test was negative today.  You also do not have a UTI.  Please come back if you begin to have sharp pain in your lower abdomen, abnormal bleeding, abnormal vaginal discharge or other symptoms that concern you.  You may follow-up with your primary care provider or OB/GYN about this incident

## 2021-03-22 NOTE — ED Provider Notes (Signed)
[facility] EMERGENCY DEPARTMENT Provider Note   CSN: [id number] Arrival date & time: 03/22/21  0848     History Chief Complaint  Patient presents with   Urinary Frequency    Maureen Perez is a G73P4 35 y.o. female with a history of celiac disease, abnormal Pap and depression presenting today with a complaint of positive Pregnancy test.  Patient had tubal ligation in 2019 however yesterday she had a positive pregnancy test.  She took this pregnancy test because she was having urinary frequency.  No dysuria, hematuria, abdominal pain or cramping.  Last menstrual period was September 10.  Reports it was lighter than usual.  Patient is sexually active with 1 female partner.  Past Medical History:  Diagnosis Date   Abnormal Pap smear    repeat WNL   Back pain    Cholelithiasis    Depression    no meds since pregancny   Family history of adverse reaction to anesthesia    " MOTHER TAKES ALONG TIME TO WAKE "   Gestational diabetes 08/20/2017   gestational - diet controlled   Hearing loss    as a child - tubes in ear   Infection    UTI   Injury of tendon of left rotator cuff    Obesity    PONV (postoperative nausea and vomiting)    Vaginal Pap smear, abnormal     Patient Active Problem List   Diagnosis Date Noted   Gluten intolerance 08/22/2020   Abnormal celiac serologies 08/22/2020   Possible celiac disease 08/22/2020   History of gastric ulcer 08/22/2020   Vitamin B 12 deficiency 08/22/2020   Gastric intestinal metaplasia 08/22/2020   Grief at loss of child 12/23/2017   Rampant dental caries 04/10/2017   GERD (gastroesophageal reflux disease) 06/15/2013   DEPRESSION, MODERATE, RECURRENT 03/20/2010   TINNITUS, CHRONIC, 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   INSOMNIA 01/06/2009   TOBACCO ABUSE 11/22/2008   ALLERGIC RHINITIS DUE TO ANIMAL HAIR AND DANDER 11/22/2008   OBESITY 10/20/2008    Past Surgical History:  Procedure Laterality Date   CHOLECYSTECTOMY N/A  12/05/2014   Procedure: LAPAROSCOPIC CHOLECYSTECTOMY WITH INTRAOPERATIVE CHOLANGIOGRAM;  Surgeon: Chevis Pretty III, MD;  Location: [facility] OR;  Service: General;  Laterality: N/A;   TONSILLECTOMY     age 22   TUBAL LIGATION Bilateral 10/23/2017   Procedure: POST PARTUM TUBAL LIGATION;  Surgeon: Adam Phenix, MD;  Location: [facility] BIRTHING SUITES;  Service: Gynecology;  Laterality: Bilateral;   tubes in ears  age 18     OB History     Gravida  5   Para  3   Term  3   Preterm  0   AB  2   Living  3      SAB  2   IAB  0   Ectopic  0   Multiple  0   Live Births  3           Family History  Problem Relation Age of Onset   Depression Mother    Diabetes Mother    Hypertension Mother    COPD Mother    Anesthesia problems Mother        problems waking and nausea and vomitting   Heart disease Mother    Cervical cancer Mother    Depression Sister    Diabetes Sister    Kidney disease Sister    Hypertension Sister    Hypertension Father    COPD Father  Anesthesia problems Father        N&V   Diabetes Father    SIDS Son        died at 40 days old   Heart disease Maternal Grandmother    Diabetes Maternal Grandfather    Heart disease Maternal Grandfather    Hypertension Sister    Colon cancer Neg Hx    Esophageal cancer Neg Hx    Inflammatory bowel disease Neg Hx    Liver disease Neg Hx    Pancreatic cancer Neg Hx    Rectal cancer Neg Hx    Stomach cancer Neg Hx     Social History   Tobacco Use   Smoking status: Every Day    Packs/day: 0.50    Years: 10.00    Pack years: 5.00    Types: Cigarettes   Smokeless tobacco: Never  Vaping Use   Vaping Use: Never used  Substance Use Topics   Alcohol use: No    Alcohol/week: 0.0 standard drinks   Drug use: No    Comment: not any more    Home Medications Prior to Admission medications   Medication Sig Start Date End Date Taking? Authorizing Provider  cetirizine (ZYRTEC) 10 MG tablet Take 10 mg by mouth daily.     [provider]  omeprazole (PRILOSEC) 40 MG capsule Take 1 capsule (40 mg total) by mouth daily. Take 30 minutes before breakfast and dinner 09/06/20   Mansouraty, Netty Starring., MD    Allergies    Cephalexin and Percocet [oxycodone-acetaminophen]  Review of Systems   Review of Systems  Constitutional:  Negative for chills and fever.  Gastrointestinal:  Negative for abdominal pain, constipation, diarrhea, nausea and vomiting.  Genitourinary:  Positive for frequency. Negative for difficulty urinating, dysuria, hematuria, pelvic pain, vaginal bleeding and vaginal discharge.  Neurological:  Negative for dizziness.  All other systems reviewed and are negative.  Physical Exam Updated Vital Signs BP 126/86 (BP Location: Left Arm)   Pulse 79   Temp 98.8 F (37.1 C) (Oral)   Resp 14   Ht 5\' 1"  (1.549 m)   Wt 74.4 kg   LMP 02/24/2021   SpO2 100%   BMI 30.99 kg/m   Physical Exam Vitals and nursing note reviewed.  Constitutional:      General: She is not in acute distress.    Appearance: Normal appearance. She is not ill-appearing.  HENT:     Head: Normocephalic and atraumatic.  Eyes:     General: No scleral icterus.    Conjunctiva/sclera: Conjunctivae normal.  Pulmonary:     Effort: Pulmonary effort is normal. No respiratory distress.  Abdominal:     General: Abdomen is flat.     Palpations: Abdomen is soft.     Tenderness: There is abdominal tenderness (minor to deep palpation of LLQ). There is no right CVA tenderness, left CVA tenderness or guarding.     Hernia: No hernia is present.  Skin:    General: Skin is warm and dry.     Findings: No rash.  Neurological:     Mental Status: She is alert.  Psychiatric:        Mood and Affect: Mood normal.        Behavior: Behavior normal.    ED Results / Procedures / Treatments   Labs (all labs ordered are listed, but only abnormal results are displayed) Labs Reviewed  URINALYSIS, ROUTINE W REFLEX MICROSCOPIC  POC  URINE PREG, ED    EKG None  Radiology No results found.  Procedures Procedures   Medications Ordered in ED Medications - No data to display  ED Course  I have reviewed the triage vital signs and the nursing notes.  Pertinent labs & imaging results that were available during my care of the patient were reviewed by me and considered in my medical decision making (see chart for details).    MDM Rules/Calculators/A&P  Patient originally came in with the fear that she was pregnant despite her tubal ligation.  She reported 1 positive pregnancy test at home.  Our urine pregnancy was negative today.  Urinalysis negative for UTI.  We discussed reasons for return and signs of ectopic pregnancy.  She reported understanding.  She is agreeable to the plan for follow-up with either her primary care or OB/GYN.  I have low suspicion for ectopic pregnancy today.  Right lower quadrant tenderness very minor.  Because her pregnancy test is negative I do not believe it is necessary to do a pelvic exam and further explore pregnancy.  Not an MAU candidate.  Agreeable and stable for discharge at this time.  Final Clinical Impression(s) / ED Diagnoses Final diagnoses:  Urinary frequency    Rx / DC Orders Results and diagnoses were explained to the patient. Return precautions discussed in full. Patient had no additional questions and expressed complete understanding.     Woodroe Chen 03/22/21 1040    Maia Plan, MD 03/26/21 [phone]

## 2021-03-22 NOTE — ED Triage Notes (Signed)
Pt has been having urinary frequency since Monday, pt took a pregnancy test yesterday and it was positive, pt has hx of tubal ligation. Pt denies abd pain, vaginal bleeding or discharge. LMP 9/10

## 2021-04-17 DIAGNOSIS — Z419 Encounter for procedure for purposes other than remedying health state, unspecified: Secondary | ICD-10-CM | POA: Diagnosis not present

## 2021-04-19 DIAGNOSIS — R0981 Nasal congestion: Secondary | ICD-10-CM | POA: Diagnosis not present

## 2021-04-19 DIAGNOSIS — B349 Viral infection, unspecified: Secondary | ICD-10-CM | POA: Diagnosis not present

## 2021-05-14 DIAGNOSIS — Z23 Encounter for immunization: Secondary | ICD-10-CM | POA: Diagnosis not present

## 2021-05-14 DIAGNOSIS — S71132A Puncture wound without foreign body, left thigh, initial encounter: Secondary | ICD-10-CM | POA: Diagnosis not present

## 2021-05-14 DIAGNOSIS — W34010A Accidental discharge of airgun, initial encounter: Secondary | ICD-10-CM | POA: Diagnosis not present

## 2021-05-17 DIAGNOSIS — Z419 Encounter for procedure for purposes other than remedying health state, unspecified: Secondary | ICD-10-CM | POA: Diagnosis not present

## 2021-06-17 DIAGNOSIS — Z419 Encounter for procedure for purposes other than remedying health state, unspecified: Secondary | ICD-10-CM | POA: Diagnosis not present

## 2021-06-20 DIAGNOSIS — J101 Influenza due to other identified influenza virus with other respiratory manifestations: Secondary | ICD-10-CM | POA: Diagnosis not present

## 2021-06-20 DIAGNOSIS — R0981 Nasal congestion: Secondary | ICD-10-CM | POA: Diagnosis not present

## 2021-07-09 DIAGNOSIS — J069 Acute upper respiratory infection, unspecified: Secondary | ICD-10-CM | POA: Diagnosis not present

## 2021-07-09 DIAGNOSIS — R509 Fever, unspecified: Secondary | ICD-10-CM | POA: Diagnosis not present

## 2021-07-09 DIAGNOSIS — R079 Chest pain, unspecified: Secondary | ICD-10-CM | POA: Diagnosis not present

## 2021-07-18 DIAGNOSIS — Z419 Encounter for procedure for purposes other than remedying health state, unspecified: Secondary | ICD-10-CM | POA: Diagnosis not present

## 2021-07-25 DIAGNOSIS — J029 Acute pharyngitis, unspecified: Secondary | ICD-10-CM | POA: Diagnosis not present

## 2021-07-25 DIAGNOSIS — H9202 Otalgia, left ear: Secondary | ICD-10-CM | POA: Diagnosis not present

## 2021-08-15 DIAGNOSIS — Z419 Encounter for procedure for purposes other than remedying health state, unspecified: Secondary | ICD-10-CM | POA: Diagnosis not present

## 2021-09-15 DIAGNOSIS — Z419 Encounter for procedure for purposes other than remedying health state, unspecified: Secondary | ICD-10-CM | POA: Diagnosis not present

## 2021-10-15 DIAGNOSIS — Z419 Encounter for procedure for purposes other than remedying health state, unspecified: Secondary | ICD-10-CM | POA: Diagnosis not present

## 2021-11-15 DIAGNOSIS — Z419 Encounter for procedure for purposes other than remedying health state, unspecified: Secondary | ICD-10-CM | POA: Diagnosis not present

## 2021-11-16 DIAGNOSIS — S0081XA Abrasion of other part of head, initial encounter: Secondary | ICD-10-CM | POA: Diagnosis not present

## 2021-11-16 DIAGNOSIS — J341 Cyst and mucocele of nose and nasal sinus: Secondary | ICD-10-CM | POA: Diagnosis not present

## 2021-11-16 DIAGNOSIS — J320 Chronic maxillary sinusitis: Secondary | ICD-10-CM | POA: Diagnosis not present

## 2021-11-16 DIAGNOSIS — S199XXA Unspecified injury of neck, initial encounter: Secondary | ICD-10-CM | POA: Diagnosis not present

## 2021-11-16 DIAGNOSIS — S0990XA Unspecified injury of head, initial encounter: Secondary | ICD-10-CM | POA: Diagnosis not present

## 2021-11-26 DIAGNOSIS — J309 Allergic rhinitis, unspecified: Secondary | ICD-10-CM | POA: Diagnosis not present

## 2021-11-26 DIAGNOSIS — R059 Cough, unspecified: Secondary | ICD-10-CM | POA: Diagnosis not present

## 2021-12-02 DIAGNOSIS — J019 Acute sinusitis, unspecified: Secondary | ICD-10-CM | POA: Diagnosis not present

## 2021-12-15 DIAGNOSIS — Z419 Encounter for procedure for purposes other than remedying health state, unspecified: Secondary | ICD-10-CM | POA: Diagnosis not present

## 2022-01-09 DIAGNOSIS — S70361A Insect bite (nonvenomous), right thigh, initial encounter: Secondary | ICD-10-CM | POA: Diagnosis not present

## 2022-01-15 DIAGNOSIS — Z419 Encounter for procedure for purposes other than remedying health state, unspecified: Secondary | ICD-10-CM | POA: Diagnosis not present

## 2022-02-15 DIAGNOSIS — Z419 Encounter for procedure for purposes other than remedying health state, unspecified: Secondary | ICD-10-CM | POA: Diagnosis not present

## 2022-03-17 DIAGNOSIS — Z419 Encounter for procedure for purposes other than remedying health state, unspecified: Secondary | ICD-10-CM | POA: Diagnosis not present

## 2022-03-22 DIAGNOSIS — R051 Acute cough: Secondary | ICD-10-CM | POA: Diagnosis not present

## 2022-03-22 DIAGNOSIS — J3089 Other allergic rhinitis: Secondary | ICD-10-CM | POA: Diagnosis not present

## 2022-04-17 DIAGNOSIS — Z419 Encounter for procedure for purposes other than remedying health state, unspecified: Secondary | ICD-10-CM | POA: Diagnosis not present

## 2022-05-17 DIAGNOSIS — Z419 Encounter for procedure for purposes other than remedying health state, unspecified: Secondary | ICD-10-CM | POA: Diagnosis not present

## 2022-06-17 DIAGNOSIS — Z419 Encounter for procedure for purposes other than remedying health state, unspecified: Secondary | ICD-10-CM | POA: Diagnosis not present

## 2022-07-18 DIAGNOSIS — Z419 Encounter for procedure for purposes other than remedying health state, unspecified: Secondary | ICD-10-CM | POA: Diagnosis not present

## 2022-08-16 DIAGNOSIS — Z419 Encounter for procedure for purposes other than remedying health state, unspecified: Secondary | ICD-10-CM | POA: Diagnosis not present

## 2022-09-16 DIAGNOSIS — Z419 Encounter for procedure for purposes other than remedying health state, unspecified: Secondary | ICD-10-CM | POA: Diagnosis not present

## 2022-10-16 DIAGNOSIS — Z419 Encounter for procedure for purposes other than remedying health state, unspecified: Secondary | ICD-10-CM | POA: Diagnosis not present

## 2022-11-16 DIAGNOSIS — Z419 Encounter for procedure for purposes other than remedying health state, unspecified: Secondary | ICD-10-CM | POA: Diagnosis not present

## 2022-11-27 ENCOUNTER — Emergency Department (HOSPITAL_COMMUNITY)
Admission: EM | Admit: 2022-11-27 | Discharge: 2022-11-27 | Disposition: A | Discharge status: Home / Self Care | Payer: Medicaid Other | Attending: Emergency Medicine | Admitting: Emergency Medicine

## 2022-11-27 ENCOUNTER — Emergency Department (HOSPITAL_COMMUNITY): Payer: Medicaid Other

## 2022-11-27 ENCOUNTER — Other Ambulatory Visit: Payer: Self-pay

## 2022-11-27 DIAGNOSIS — S060X0A Concussion without loss of consciousness, initial encounter: Secondary | ICD-10-CM | POA: Insufficient documentation

## 2022-11-27 DIAGNOSIS — S199XXA Unspecified injury of neck, initial encounter: Secondary | ICD-10-CM | POA: Diagnosis not present

## 2022-11-27 DIAGNOSIS — S0990XA Unspecified injury of head, initial encounter: Secondary | ICD-10-CM | POA: Diagnosis not present

## 2022-11-27 DIAGNOSIS — R519 Headache, unspecified: Secondary | ICD-10-CM | POA: Diagnosis not present

## 2022-11-27 DIAGNOSIS — M542 Cervicalgia: Secondary | ICD-10-CM | POA: Diagnosis not present

## 2022-11-27 NOTE — Discharge Instructions (Addendum)
You were seen in the emergency department for a headache after hitting your head against a car frame. Your imaging was negative for any bleeds, masses, or fractures in your head or neck. I would advise practicing cognitive rest and following up with the concussion clinic for evaluation. If symptoms are worsening, please return to the ER. Otherwise, follow up with the concussion clinic and your primary care provider.

## 2022-11-27 NOTE — ED Provider Notes (Signed)
[place] EMERGENCY DEPARTMENT AT [facility] Provider Note   CSN: [id number] Arrival date & time: 11/27/22  1712     History Chief Complaint  Patient presents with   Headache    Maureen Perez is a 37 y.o. female.  Patient presents to the emergency department complaints of a headache.  She reports that 2 days ago that she struck her head when she was entering a vehicle.  She reports that since then she has had a persistent headache which is not largely improved.  No significant past history for migraine headaches.  Has tried taking some over-the-counter pain medications without significant improvement in symptoms.  Denies any unilateral weakness or numbness.  Denies any vision changes such as double vision or blurry vision.  Not currently on blood thinners.  No prior history of stroke.  Patient primarily concerned that symptoms have not been improving although several days of rest.   Headache      Home Medications Prior to Admission medications   Medication Sig Start Date End Date Taking? Authorizing Provider  cetirizine (ZYRTEC) 10 MG tablet Take 10 mg by mouth daily.    [provider]  omeprazole (PRILOSEC) 40 MG capsule Take 1 capsule (40 mg total) by mouth daily. Take 30 minutes before breakfast and dinner 09/06/20   Mansouraty, Netty Starring., MD      Allergies    Cephalexin and Percocet [oxycodone-acetaminophen]    Review of Systems   Review of Systems  Neurological:  Positive for headaches.  All other systems reviewed and are negative.   Physical Exam Updated Vital Signs BP 126/82   Pulse 83   Temp 98.3 F (36.8 C) (Oral)   Resp 18   Ht 5\' 1"  (1.549 m)   Wt 74.4 kg   SpO2 98%   BMI 30.99 kg/m  Physical Exam Vitals and nursing note reviewed.  Constitutional:      General: She is not in acute distress.    Appearance: She is well-developed.  HENT:     Head: Normocephalic and atraumatic.  Eyes:     General: No scleral icterus.     Extraocular Movements: Extraocular movements intact.     Conjunctiva/sclera: Conjunctivae normal.     Pupils: Pupils are equal, round, and reactive to light. Pupils are equal.  Neck:      Comments: ROM normal but somewhat limited due to pain. Cardiovascular:     Rate and Rhythm: Normal rate and regular rhythm.     Heart sounds: No murmur heard. Pulmonary:     Effort: Pulmonary effort is normal. No respiratory distress.     Breath sounds: Normal breath sounds.  Abdominal:     Palpations: Abdomen is soft.     Tenderness: There is no abdominal tenderness.  Musculoskeletal:        General: No swelling.     Cervical back: Normal range of motion and neck supple. No rigidity.  Lymphadenopathy:     Cervical: No cervical adenopathy.  Skin:    General: Skin is warm and dry.     Capillary Refill: Capillary refill takes less than 2 seconds.  Neurological:     Mental Status: She is alert.  Psychiatric:        Mood and Affect: Mood normal.     ED Results / Procedures / Treatments   Labs (all labs ordered are listed, but only abnormal results are displayed) Labs Reviewed - No data to display  EKG None  Radiology CT Head  Wo Contrast  Result Date: 11/27/2022 CLINICAL DATA:  Head trauma, moderate-severe; Neck trauma, midline tenderness (Age 35-64y). Headache since hitting head 2 days ago. EXAM: CT HEAD WITHOUT CONTRAST CT CERVICAL SPINE WITHOUT CONTRAST TECHNIQUE: Multidetector CT imaging of the head and cervical spine was performed following the standard protocol without intravenous contrast. Multiplanar CT image reconstructions of the cervical spine were also generated. RADIATION DOSE REDUCTION: This exam was performed according to the departmental dose-optimization program which includes automated exposure control, adjustment of the mA and/or kV according to patient size and/or use of iterative reconstruction technique. COMPARISON:  CT head and cervical spine and CT neck 11/16/2021  FINDINGS: CT HEAD FINDINGS Brain: There is no evidence of an acute infarct, intracranial hemorrhage, mass, midline shift, or extra-axial fluid collection. The ventricles and sulci are normal. Vascular: No hyperdense vessel. Skull: No acute fracture or suspicious osseous lesion. Sinuses/Orbits: Visualized paranasal sinuses and mastoid air cells are clear. Visualized orbits are unremarkable. Other: None. CT CERVICAL SPINE FINDINGS Alignment: Cervical spine straightening.  No listhesis. Skull base and vertebrae: No acute fracture or suspicious osseous lesion. Soft tissues and spinal canal: No prevertebral fluid or swelling. No visible canal hematoma. Disc levels:  Mild spondylosis at C4-5. Upper chest: Included lung apices are clear. Other: Chronically prominent soft tissue at the left greater than right tongue base without a suspicious lesion identified on the prior contrast-enhanced neck CT. IMPRESSION: 1. Negative head CT. 2. No acute fracture or subluxation in the cervical spine. Electronically Signed   By: Sebastian Ache [name]   On: 11/27/2022 18:34   CT Cervical Spine Wo Contrast  Result Date: 11/27/2022 CLINICAL DATA:  Head trauma, moderate-severe; Neck trauma, midline tenderness (Age 35-64y). Headache since hitting head 2 days ago. EXAM: CT HEAD WITHOUT CONTRAST CT CERVICAL SPINE WITHOUT CONTRAST TECHNIQUE: Multidetector CT imaging of the head and cervical spine was performed following the standard protocol without intravenous contrast. Multiplanar CT image reconstructions of the cervical spine were also generated. RADIATION DOSE REDUCTION: This exam was performed according to the departmental dose-optimization program which includes automated exposure control, adjustment of the mA and/or kV according to patient size and/or use of iterative reconstruction technique. COMPARISON:  CT head and cervical spine and CT neck 11/16/2021 FINDINGS: CT HEAD FINDINGS Brain: There is no evidence of an acute infarct,  intracranial hemorrhage, mass, midline shift, or extra-axial fluid collection. The ventricles and sulci are normal. Vascular: No hyperdense vessel. Skull: No acute fracture or suspicious osseous lesion. Sinuses/Orbits: Visualized paranasal sinuses and mastoid air cells are clear. Visualized orbits are unremarkable. Other: None. CT CERVICAL SPINE FINDINGS Alignment: Cervical spine straightening.  No listhesis. Skull base and vertebrae: No acute fracture or suspicious osseous lesion. Soft tissues and spinal canal: No prevertebral fluid or swelling. No visible canal hematoma. Disc levels:  Mild spondylosis at C4-5. Upper chest: Included lung apices are clear. Other: Chronically prominent soft tissue at the left greater than right tongue base without a suspicious lesion identified on the prior contrast-enhanced neck CT. IMPRESSION: 1. Negative head CT. 2. No acute fracture or subluxation in the cervical spine. Electronically Signed   By: Sebastian Ache [name]   On: 11/27/2022 18:34    Procedures Procedures   Medications Ordered in ED Medications - No data to display  ED Course/ Medical Decision Making/ A&P                           Medical Decision Making Amount and/or  Complexity of Data Reviewed Radiology: ordered.   This patient presents to the ED for concern of headache.  Differential diagnosis includes concussion, cervical strain, MVC, syncope, TIA   Imaging Studies ordered:  I ordered imaging studies including CT head, CT cervical spine I independently visualized and interpreted imaging which showed no acute abnormality I agree with the radiologist interpretation   Problem List / ED Course:  Patient presents emergency department complaints of a headache.  She reports that when she was getting out of a vehicle 2 days ago she hit her head against the door frame.  Has had a headache since then without any significant associated nausea or vomiting.  No prior history of head trauma, stroke.  Not  on blood thinners.  Patient tried managing symptoms with some counter medication without improvement. Will evaluate with CT imaging of head and cervical spine given patient reporting pain is centered at base of skull. CT imaging negative for any acute abnormalities.  Informed patient of findings.  Most likely patient's symptoms are consistent with a concussion. Will have patient follow up outpatient with concussion clinic through [facility] sports medicine. Patient is agreeable with treatment plan and verbalized understanding all return precautions. All questions answered prior to patient discharge.   Final Clinical Impression(s) / ED Diagnoses Final diagnoses:  Concussion without loss of consciousness, initial encounter    Rx / DC Orders ED Discharge Orders     None         Smitty Knudsen, PA-C 11/27/22 1950    Glyn Ade, MD 11/27/22 2250

## 2022-11-27 NOTE — ED Triage Notes (Signed)
Pt states she hit her head while getting out of car 2 days ago and has had a headache since.

## 2022-12-15 DIAGNOSIS — R059 Cough, unspecified: Secondary | ICD-10-CM | POA: Diagnosis not present

## 2022-12-15 DIAGNOSIS — J069 Acute upper respiratory infection, unspecified: Secondary | ICD-10-CM | POA: Diagnosis not present

## 2022-12-16 DIAGNOSIS — Z419 Encounter for procedure for purposes other than remedying health state, unspecified: Secondary | ICD-10-CM | POA: Diagnosis not present

## 2023-01-16 DIAGNOSIS — Z419 Encounter for procedure for purposes other than remedying health state, unspecified: Secondary | ICD-10-CM | POA: Diagnosis not present

## 2023-02-16 DIAGNOSIS — Z419 Encounter for procedure for purposes other than remedying health state, unspecified: Secondary | ICD-10-CM | POA: Diagnosis not present

## 2023-03-18 DIAGNOSIS — Z419 Encounter for procedure for purposes other than remedying health state, unspecified: Secondary | ICD-10-CM | POA: Diagnosis not present

## 2023-04-06 DIAGNOSIS — J019 Acute sinusitis, unspecified: Secondary | ICD-10-CM | POA: Diagnosis not present

## 2023-04-06 DIAGNOSIS — J3089 Other allergic rhinitis: Secondary | ICD-10-CM | POA: Diagnosis not present

## 2023-04-06 DIAGNOSIS — J069 Acute upper respiratory infection, unspecified: Secondary | ICD-10-CM | POA: Diagnosis not present

## 2023-04-18 DIAGNOSIS — Z419 Encounter for procedure for purposes other than remedying health state, unspecified: Secondary | ICD-10-CM | POA: Diagnosis not present

## 2023-05-18 DIAGNOSIS — Z419 Encounter for procedure for purposes other than remedying health state, unspecified: Secondary | ICD-10-CM | POA: Diagnosis not present

## 2023-05-23 DIAGNOSIS — J09X2 Influenza due to identified novel influenza A virus with other respiratory manifestations: Secondary | ICD-10-CM | POA: Diagnosis not present

## 2023-06-18 DIAGNOSIS — Z419 Encounter for procedure for purposes other than remedying health state, unspecified: Secondary | ICD-10-CM | POA: Diagnosis not present

## 2023-06-23 ENCOUNTER — Emergency Department (HOSPITAL_COMMUNITY)
Admission: EM | Admit: 2023-06-23 | Discharge: 2023-06-24 | Disposition: A | Discharge status: Home / Self Care | Payer: Medicaid Other | Attending: Emergency Medicine | Admitting: Emergency Medicine

## 2023-06-23 ENCOUNTER — Emergency Department (HOSPITAL_COMMUNITY): Payer: Medicaid Other

## 2023-06-23 ENCOUNTER — Other Ambulatory Visit: Payer: Self-pay

## 2023-06-23 DIAGNOSIS — R0789 Other chest pain: Secondary | ICD-10-CM | POA: Diagnosis not present

## 2023-06-23 DIAGNOSIS — R079 Chest pain, unspecified: Secondary | ICD-10-CM | POA: Insufficient documentation

## 2023-06-23 DIAGNOSIS — F17290 Nicotine dependence, other tobacco product, uncomplicated: Secondary | ICD-10-CM | POA: Diagnosis not present

## 2023-06-23 LAB — COMPREHENSIVE METABOLIC PANEL
ALT: 22 U/L (ref 0–44)
AST: 17 U/L (ref 15–41)
Albumin: 4.3 g/dL (ref 3.5–5.0)
Alkaline Phosphatase: 56 U/L (ref 38–126)
Anion gap: 10 (ref 5–15)
BUN: 13 mg/dL (ref 6–20)
CO2: 24 mmol/L (ref 22–32)
Calcium: 9.6 mg/dL (ref 8.9–10.3)
Chloride: 103 mmol/L (ref 98–111)
Creatinine, Ser: 0.79 mg/dL (ref 0.44–1.00)
GFR, Estimated: >60 mL/min (ref >60)
Glucose, Bld: 117 mg/dL — ABNORMAL HIGH (ref 70–99)
Potassium: 4 mmol/L (ref 3.5–5.1)
Sodium: 137 mmol/L (ref 135–145)
Total Bilirubin: 0.3 mg/dL (ref 0.0–1.2)
Total Protein: 7.2 g/dL (ref 6.5–8.1)

## 2023-06-23 LAB — URINALYSIS, W/ REFLEX TO CULTURE (INFECTION SUSPECTED)
Bilirubin Urine: NEGATIVE
Glucose, UA: NEGATIVE mg/dL
Hgb urine dipstick: NEGATIVE
Ketones, ur: NEGATIVE mg/dL
Nitrite: NEGATIVE
Protein, ur: NEGATIVE mg/dL
Specific Gravity, Urine: 1.031 — ABNORMAL HIGH (ref 1.005–1.030)
pH: 5 (ref 5.0–8.0)

## 2023-06-23 LAB — CBC WITH DIFFERENTIAL/PLATELET
Abs Immature Granulocytes: 0.05 10*3/uL (ref 0.00–0.07)
Basophils Absolute: 0.1 10*3/uL (ref 0.0–0.1)
Basophils Relative: 1 %
Eosinophils Absolute: 0.4 10*3/uL (ref 0.0–0.5)
Eosinophils Relative: 3 %
HCT: 43.1 % (ref 36.0–46.0)
Hemoglobin: 14.6 g/dL (ref 12.0–15.0)
Immature Granulocytes: 0 %
Lymphocytes Relative: 24 %
Lymphs Abs: 2.9 10*3/uL (ref 0.7–4.0)
MCH: 30.9 pg (ref 26.0–34.0)
MCHC: 33.9 g/dL (ref 30.0–36.0)
MCV: 91.3 fL (ref 80.0–100.0)
Monocytes Absolute: 0.7 10*3/uL (ref 0.1–1.0)
Monocytes Relative: 6 %
Neutro Abs: 8.2 10*3/uL — ABNORMAL HIGH (ref 1.7–7.7)
Neutrophils Relative %: 66 %
Platelets: 261 10*3/uL (ref 150–400)
RBC: 4.72 MIL/uL (ref 3.87–5.11)
RDW: 13.2 % (ref 11.5–15.5)
WBC: 12.3 10*3/uL — ABNORMAL HIGH (ref 4.0–10.5)
nRBC: 0 % (ref 0.0–0.2)

## 2023-06-23 LAB — TROPONIN I (HIGH SENSITIVITY)
Troponin I (High Sensitivity): <2 ng/L (ref <18)
Troponin I (High Sensitivity): <2 ng/L (ref <18)

## 2023-06-23 LAB — PREGNANCY, URINE: Preg Test, Ur: NEGATIVE

## 2023-06-23 MED ORDER — ASPIRIN 81 MG PO CHEW
324.0000 mg | CHEWABLE_TABLET | Freq: Once | ORAL | Status: AC
  Administered 2023-06-23: 324 mg via ORAL
  Filled 2023-06-23: qty 4

## 2023-06-23 MED ORDER — NITROGLYCERIN 0.4 MG SL SUBL
0.4000 mg | SUBLINGUAL_TABLET | SUBLINGUAL | Status: DC | PRN
  Administered 2023-06-23: 0.4 mg via SUBLINGUAL
  Filled 2023-06-23: qty 1

## 2023-06-23 NOTE — ED Triage Notes (Signed)
 Pt arrived via POV. C/o L sided chest pain that began last night. Movement exacerbated the pain.  No hx cardiac issues.  AOx4

## 2023-06-23 NOTE — ED Provider Triage Note (Signed)
 Emergency Medicine Provider Triage Evaluation Note  Maureen Perez , a 38 y.o. female  was evaluated in triage.  Pt complains of chest pain.  Review of Systems  Positive: CP, SOB Negative: Nausea, fever, cough  Physical Exam  BP (!) 143/104 (BP Location: Left Arm)   Pulse 78   Temp 98.2 F (36.8 C)   Resp 16   Ht 5' 1 (1.549 m)   Wt 74.4 kg   SpO2 100%   BMI 30.99 kg/m  Gen:   Awake, no distress   Resp:  Normal effort  MSK:   Moves extremities without difficulty  Other:    Medical Decision Making  Medically screening exam initiated at 3:52 PM.  Appropriate orders placed.  Maureen Perez was informed that the remainder of the evaluation will be completed by another provider, this initial triage assessment does not replace that evaluation, and the importance of remaining in the ED until their evaluation is complete.  Started last night, returned today. Better when sitting, intensifies when walking. Smoker, no PMH.   EKG reviewed with Dr. Freddi - no concerning changes.    Odell Balls, PA-C 06/23/23 1553

## 2023-06-24 NOTE — ED Provider Notes (Signed)
 [facility] Provider Note   CSN: [id number] Arrival date & time: 06/23/23  1535     History  Chief Complaint  Patient presents with   Chest Pain    Maureen Perez is a 38 y.o. female.  HPI 38 yo female presents co left anterior chest pain sharp and constant for hours. Worse with movement.  No dyspnea.  No prior similar sxs.  Patient is smoker.  Denies pregnancy with nl menses.  No daily meds except allergy pills. Both parents with heart disease, no known mi      Home Medications Prior to Admission medications   Medication Sig Start Date End Date Taking? Authorizing Provider  cetirizine  (ZYRTEC ) 10 MG tablet Take 10 mg by mouth daily.    [provider]  omeprazole  (PRILOSEC ) 40 MG capsule Take 1 capsule (40 mg total) by mouth daily. Take 30 minutes before breakfast and dinner 09/06/20   Mansouraty, Aloha Raddle., MD      Allergies    Cephalexin and Percocet [oxycodone -acetaminophen ]    Review of Systems   Review of Systems  Physical Exam Updated Vital Signs BP 120/73 (BP Location: Left Arm)   Pulse 70   Temp 98.4 F (36.9 C) (Oral)   Resp 17   Ht 1.549 m (5' 1)   Wt 74.4 kg   LMP 06/18/2023   SpO2 96%   BMI 30.99 kg/m  Physical Exam Vitals reviewed.  HENT:     Head: Normocephalic.  Eyes:     Pupils: Pupils are equal, round, and reactive to light.  Cardiovascular:     Rate and Rhythm: Normal rate and regular rhythm.     Heart sounds: Normal heart sounds.  Pulmonary:     Effort: Pulmonary effort is normal.     Breath sounds: Normal breath sounds.  Abdominal:     Palpations: Abdomen is soft.  Musculoskeletal:        General: Normal range of motion.     Cervical back: Normal range of motion.  Skin:    General: Skin is warm.     Capillary Refill: Capillary refill takes less than 2 seconds.  Neurological:     General: No focal deficit present.     Mental Status: She is alert.     ED Results /  Procedures / Treatments   Labs (all labs ordered are listed, but only abnormal results are displayed) Labs Reviewed  CBC WITH DIFFERENTIAL/PLATELET - Abnormal; Notable for the following components:      Result Value   WBC 12.3 (*)    Neutro Abs 8.2 (*)    All other components within normal limits  COMPREHENSIVE METABOLIC PANEL - Abnormal; Notable for the following components:   Glucose, Bld 117 (*)    All other components within normal limits  URINALYSIS, W/ REFLEX TO CULTURE (INFECTION SUSPECTED) - Abnormal; Notable for the following components:   APPearance CLOUDY (*)    Specific Gravity, Urine 1.031 (*)    Leukocytes,Ua TRACE (*)    Bacteria, UA FEW (*)    All other components within normal limits  PREGNANCY, URINE  TROPONIN I (HIGH SENSITIVITY)  TROPONIN I (HIGH SENSITIVITY)    EKG EKG Interpretation Date/Time:  Monday June 23 2023 15:43:46 EST Ventricular Rate:  84 PR Interval:  137 QRS Duration:  97 QT Interval:  393 QTC Calculation: 465 R Axis:   57  Text Interpretation: Sinus rhythm RSR' in V1 or V2, right VCD or RVH  Nonspecific T abnormalities, anterior leads Baseline wander in lead(s) V3 no significant change since 2018 Confirmed by Freddi Hamilton [phone]) on 06/23/2023 3:50:51 PM  Radiology DG Chest Portable 2 Views Result Date: 06/23/2023 CLINICAL DATA:  Left-sided chest pain EXAM: CHEST  2 VIEW PORTABLE COMPARISON:  05/02/2016 FINDINGS: The heart size and mediastinal contours are within normal limits. Both lungs are clear. The visualized skeletal structures are unremarkable. IMPRESSION: No acute abnormality of the lungs. Electronically Signed   By: Marolyn JONETTA Jaksch [name]   On: 06/23/2023 17:03    Procedures Procedures    Medications Ordered in ED Medications  nitroGLYCERIN  (NITROSTAT ) SL tablet 0.4 mg (0.4 mg Sublingual Given 06/23/23 1556)  aspirin  chewable tablet 324 mg (324 mg Oral Given 06/23/23 1555)    ED Course/ Medical Decision Making/ A&P              HEART Score: 0                    Medical Decision Making Amount and/or Complexity of Data Reviewed Radiology: ordered.   Patient seen and evaluated for chest pain.  Differential diagnosis of serious/life threatening causes of chest pain includes ACS, other diseases of the heart such as myocarditis or pericarditis, lung etiologies such as infection or pneumothorax, diseases of the great vessels such as aortic dissection or AAA, pulmonary embolism, or GI sources such as cholecystitis or other upper abdominal causes. Doubt ACS- heart score documented, EKG reviewed, Given the timing of pain to ER presentation, delta troponinwas normal so doubt NSTEMI troponin and repeat troponin obtained and WNL Doubt myocarditis/pericarditis/tamponade based on history, review of ekg and labs Doubt aortic dissection based on history and review of imaging Doubt intrinsic lung causes such as pneumonia or pneumothorax, based on history, physical exam, and studies obtained. Doubt PE based on history, physical exam, and PERC Doubt acute GI etiology requiring intervention based on history, physical exam and labs. Patient appears stable for discharge. Return precautions and need for follow up discussed and patient voices understanding         Final Clinical Impression(s) / ED Diagnoses Final diagnoses:  Nonspecific chest pain    Rx / DC Orders ED Discharge Orders     None         Levander Houston, MD 06/24/23 1006

## 2023-07-04 ENCOUNTER — Ambulatory Visit (INDEPENDENT_AMBULATORY_CARE_PROVIDER_SITE_OTHER): Payer: Medicaid Other | Admitting: Family Medicine

## 2023-07-04 ENCOUNTER — Encounter: Payer: Self-pay | Admitting: Family Medicine

## 2023-07-04 VITALS — BP 129/77 | HR 80 | Ht 61.0 in | Wt 170.4 lb

## 2023-07-04 DIAGNOSIS — K31A0 Gastric intestinal metaplasia, unspecified: Secondary | ICD-10-CM

## 2023-07-04 DIAGNOSIS — Z0000 Encounter for general adult medical examination without abnormal findings: Secondary | ICD-10-CM | POA: Diagnosis not present

## 2023-07-04 DIAGNOSIS — D72829 Elevated white blood cell count, unspecified: Secondary | ICD-10-CM

## 2023-07-04 DIAGNOSIS — F17200 Nicotine dependence, unspecified, uncomplicated: Secondary | ICD-10-CM | POA: Diagnosis not present

## 2023-07-04 DIAGNOSIS — Z23 Encounter for immunization: Secondary | ICD-10-CM | POA: Diagnosis not present

## 2023-07-04 DIAGNOSIS — Z1159 Encounter for screening for other viral diseases: Secondary | ICD-10-CM | POA: Diagnosis not present

## 2023-07-04 NOTE — Patient Instructions (Signed)
It was great to see you again today.  Flu shot and pneumonia shot today Checking labs - blood counts, hepatitis C Referring back to GI doctor Schedule follow up appointment with me for pap smear  Be well, Dr. Pollie Meyer

## 2023-07-04 NOTE — Progress Notes (Unsigned)
  Date of Visit: 07/04/2023   SUBJECTIVE:   HPI:  Cynnamon presents today for a new patient appointment to establish general primary care.  Prior PCP: ***, last seen ***  Other care team members: *** -[facility] - Dr. Meridee Score  Concerns today: ***  Past Medical Hx:  -ulcer, had EGD GERD no longer an issue Allergic rhintiis Depression in past, not rcently an issue  Past Surgical Hx:  -***  Family Hx: updated in Epic  Social Hx:  - occupation: *** - highest level of education: 11th - lives with: *** - tobacco: *** - alcohol: *** - drugs: ***  Health Maintenance:  -***   OBJECTIVE:   BP 129/77   Pulse 80   Ht 5\' 1"  (1.549 m)   Wt 170 lb 6.4 oz (77.3 kg)   LMP 06/18/2023   SpO2 100%   BMI 32.20 kg/m  Gen: *** HEENT: *** Heart: *** Lungs: *** Neuro: *** Ext: ***  ASSESSMENT/PLAN:   Health maintenance:  -***   Assessment & Plan Need for hepatitis C screening test  Leukocytosis, unspecified type  Routine adult health maintenance     FOLLOW UP: Follow up in *** for ***  [place] J. Pollie Meyer, MD, [facility]

## 2023-07-05 ENCOUNTER — Encounter: Payer: Self-pay | Admitting: Family Medicine

## 2023-07-05 LAB — CBC WITH DIFFERENTIAL/PLATELET
Basophils Absolute: 0.1 10*3/uL (ref 0.0–0.2)
Basos: 1 %
EOS (ABSOLUTE): 0.3 10*3/uL (ref 0.0–0.4)
Eos: 4 %
Hematocrit: 42.8 % (ref 34.0–46.6)
Hemoglobin: 14 g/dL (ref 11.1–15.9)
Immature Grans (Abs): 0 10*3/uL (ref 0.0–0.1)
Immature Granulocytes: 0 %
Lymphocytes Absolute: 2.7 10*3/uL (ref 0.7–3.1)
Lymphs: 28 %
MCH: 29.4 pg (ref 26.6–33.0)
MCHC: 32.7 g/dL (ref 31.5–35.7)
MCV: 90 fL (ref 79–97)
Monocytes Absolute: 0.5 10*3/uL (ref 0.1–0.9)
Monocytes: 6 %
Neutrophils Absolute: 6 10*3/uL (ref 1.4–7.0)
Neutrophils: 61 %
Platelets: 299 10*3/uL (ref 150–450)
RBC: 4.76 x10E6/uL (ref 3.77–5.28)
RDW: 12.4 % (ref 11.7–15.4)
WBC: 9.6 10*3/uL (ref 3.4–10.8)

## 2023-07-05 LAB — HCV INTERPRETATION

## 2023-07-05 LAB — HCV AB W REFLEX TO QUANT PCR: HCV Ab: NONREACTIVE

## 2023-07-06 ENCOUNTER — Encounter: Payer: Self-pay | Admitting: Family Medicine

## 2023-07-06 NOTE — Assessment & Plan Note (Signed)
Referring back to GI for follow-up, due soon for repeat EGD

## 2023-07-06 NOTE — Assessment & Plan Note (Signed)
Advised we can discuss cessation techniques in the future if she would like.  Pneumococcal vaccine today.

## 2023-07-19 DIAGNOSIS — Z419 Encounter for procedure for purposes other than remedying health state, unspecified: Secondary | ICD-10-CM | POA: Diagnosis not present

## 2023-07-21 NOTE — Progress Notes (Deleted)
    SUBJECTIVE:   CHIEF COMPLAINT / HPI:   *Pap  Smoking cessation  PERTINENT  PMH / PSH: ***  OBJECTIVE:   LMP 06/18/2023  ***  General: NAD, pleasant, able to participate in exam Cardiac: RRR, no murmurs. Respiratory: CTAB, normal effort, No wheezes, rales or rhonchi Abdomen: Bowel sounds present, nontender, nondistended Extremities: no edema or cyanosis. Skin: warm and dry, no rashes noted Neuro: alert, no obvious focal deficits Psych: Normal affect and mood  ASSESSMENT/PLAN:   No problem-specific Assessment & Plan notes found for this encounter.     Dr. Izetta Nap, DO [facility]    {    This will disappear when note is signed, click to select method of visit    :1}

## 2023-07-22 ENCOUNTER — Ambulatory Visit: Payer: Self-pay | Admitting: Family Medicine

## 2023-07-29 ENCOUNTER — Ambulatory Visit: Payer: Medicaid Other | Admitting: Family Medicine

## 2023-08-16 DIAGNOSIS — Z419 Encounter for procedure for purposes other than remedying health state, unspecified: Secondary | ICD-10-CM | POA: Diagnosis not present

## 2023-09-10 DIAGNOSIS — R051 Acute cough: Secondary | ICD-10-CM | POA: Diagnosis not present

## 2023-09-10 DIAGNOSIS — R0981 Nasal congestion: Secondary | ICD-10-CM | POA: Diagnosis not present

## 2023-09-10 DIAGNOSIS — J208 Acute bronchitis due to other specified organisms: Secondary | ICD-10-CM | POA: Diagnosis not present

## 2023-09-10 DIAGNOSIS — Z87891 Personal history of nicotine dependence: Secondary | ICD-10-CM | POA: Diagnosis not present

## 2023-09-27 DIAGNOSIS — Z419 Encounter for procedure for purposes other than remedying health state, unspecified: Secondary | ICD-10-CM | POA: Diagnosis not present

## 2023-10-22 DIAGNOSIS — R0982 Postnasal drip: Secondary | ICD-10-CM | POA: Diagnosis not present

## 2023-10-22 DIAGNOSIS — J060 Acute laryngopharyngitis: Secondary | ICD-10-CM | POA: Diagnosis not present

## 2023-10-22 DIAGNOSIS — R0981 Nasal congestion: Secondary | ICD-10-CM | POA: Diagnosis not present

## 2023-10-22 DIAGNOSIS — R051 Acute cough: Secondary | ICD-10-CM | POA: Diagnosis not present

## 2023-10-27 DIAGNOSIS — Z419 Encounter for procedure for purposes other than remedying health state, unspecified: Secondary | ICD-10-CM | POA: Diagnosis not present

## 2023-11-27 DIAGNOSIS — Z419 Encounter for procedure for purposes other than remedying health state, unspecified: Secondary | ICD-10-CM | POA: Diagnosis not present

## 2023-12-27 DIAGNOSIS — Z419 Encounter for procedure for purposes other than remedying health state, unspecified: Secondary | ICD-10-CM | POA: Diagnosis not present

## 2024-01-23 DIAGNOSIS — R051 Acute cough: Secondary | ICD-10-CM | POA: Diagnosis not present

## 2024-01-23 DIAGNOSIS — R0982 Postnasal drip: Secondary | ICD-10-CM | POA: Diagnosis not present

## 2024-01-23 DIAGNOSIS — R0981 Nasal congestion: Secondary | ICD-10-CM | POA: Diagnosis not present

## 2024-01-23 DIAGNOSIS — J069 Acute upper respiratory infection, unspecified: Secondary | ICD-10-CM | POA: Diagnosis not present

## 2024-01-27 DIAGNOSIS — Z419 Encounter for procedure for purposes other than remedying health state, unspecified: Secondary | ICD-10-CM | POA: Diagnosis not present

## 2024-02-17 DIAGNOSIS — R051 Acute cough: Secondary | ICD-10-CM | POA: Diagnosis not present

## 2024-02-17 DIAGNOSIS — R0982 Postnasal drip: Secondary | ICD-10-CM | POA: Diagnosis not present

## 2024-02-17 DIAGNOSIS — R0981 Nasal congestion: Secondary | ICD-10-CM | POA: Diagnosis not present

## 2024-02-17 DIAGNOSIS — J069 Acute upper respiratory infection, unspecified: Secondary | ICD-10-CM | POA: Diagnosis not present

## 2024-02-27 DIAGNOSIS — Z419 Encounter for procedure for purposes other than remedying health state, unspecified: Secondary | ICD-10-CM | POA: Diagnosis not present

## 2024-03-02 DIAGNOSIS — H5213 Myopia, bilateral: Secondary | ICD-10-CM | POA: Diagnosis not present

## 2024-03-28 DIAGNOSIS — Z419 Encounter for procedure for purposes other than remedying health state, unspecified: Secondary | ICD-10-CM | POA: Diagnosis not present

## 2024-04-29 DIAGNOSIS — R5381 Other malaise: Secondary | ICD-10-CM | POA: Diagnosis not present

## 2024-04-29 DIAGNOSIS — R051 Acute cough: Secondary | ICD-10-CM | POA: Diagnosis not present

## 2024-04-29 DIAGNOSIS — J060 Acute laryngopharyngitis: Secondary | ICD-10-CM | POA: Diagnosis not present

## 2024-04-29 DIAGNOSIS — R0982 Postnasal drip: Secondary | ICD-10-CM | POA: Diagnosis not present

## 2024-04-29 DIAGNOSIS — R0981 Nasal congestion: Secondary | ICD-10-CM | POA: Diagnosis not present

## 2024-05-26 ENCOUNTER — Encounter: Payer: Self-pay | Admitting: Family Medicine
